# Patient Record
Sex: Female | Born: 1945 | Race: Black or African American | Hispanic: No | Marital: Married | Smoking: Current every day smoker
Health system: Southern US, Community
[De-identification: ages and names within clinical notes are randomized; demographics above are authoritative.]

## PROBLEM LIST (undated history)

## (undated) DIAGNOSIS — R0609 Other forms of dyspnea: Secondary | ICD-10-CM

## (undated) DIAGNOSIS — C50919 Malignant neoplasm of unspecified site of unspecified female breast: Secondary | ICD-10-CM

## (undated) DIAGNOSIS — E039 Hypothyroidism, unspecified: Secondary | ICD-10-CM

## (undated) DIAGNOSIS — R011 Cardiac murmur, unspecified: Secondary | ICD-10-CM

## (undated) DIAGNOSIS — I10 Essential (primary) hypertension: Secondary | ICD-10-CM

## (undated) DIAGNOSIS — R0600 Dyspnea, unspecified: Secondary | ICD-10-CM

## (undated) DIAGNOSIS — I639 Cerebral infarction, unspecified: Secondary | ICD-10-CM

## (undated) DIAGNOSIS — IMO0002 Reserved for concepts with insufficient information to code with codable children: Secondary | ICD-10-CM

## (undated) DIAGNOSIS — I509 Heart failure, unspecified: Secondary | ICD-10-CM

## (undated) DIAGNOSIS — G5601 Carpal tunnel syndrome, right upper limb: Secondary | ICD-10-CM

## (undated) DIAGNOSIS — I219 Acute myocardial infarction, unspecified: Secondary | ICD-10-CM

## (undated) DIAGNOSIS — I209 Angina pectoris, unspecified: Secondary | ICD-10-CM

## (undated) DIAGNOSIS — M79609 Pain in unspecified limb: Secondary | ICD-10-CM

## (undated) DIAGNOSIS — I739 Peripheral vascular disease, unspecified: Secondary | ICD-10-CM

## (undated) DIAGNOSIS — E7800 Pure hypercholesterolemia, unspecified: Secondary | ICD-10-CM

## (undated) DIAGNOSIS — J189 Pneumonia, unspecified organism: Secondary | ICD-10-CM

## (undated) DIAGNOSIS — I499 Cardiac arrhythmia, unspecified: Secondary | ICD-10-CM

## (undated) DIAGNOSIS — E119 Type 2 diabetes mellitus without complications: Secondary | ICD-10-CM

## (undated) DIAGNOSIS — R569 Unspecified convulsions: Secondary | ICD-10-CM

## (undated) DIAGNOSIS — M1990 Unspecified osteoarthritis, unspecified site: Secondary | ICD-10-CM

## (undated) DIAGNOSIS — G9511 Acute infarction of spinal cord (embolic) (nonembolic): Secondary | ICD-10-CM

## (undated) DIAGNOSIS — N319 Neuromuscular dysfunction of bladder, unspecified: Secondary | ICD-10-CM

## (undated) DIAGNOSIS — G9581 Conus medullaris syndrome: Secondary | ICD-10-CM

## (undated) HISTORY — DX: Essential (primary) hypertension: I10

## (undated) HISTORY — DX: Unspecified osteoarthritis, unspecified site: M19.90

## (undated) HISTORY — DX: Cerebral infarction, unspecified: I63.9

## (undated) HISTORY — PX: TUBAL LIGATION: SHX77

## (undated) HISTORY — DX: Cardiac arrhythmia, unspecified: I49.9

## (undated) HISTORY — DX: Reserved for concepts with insufficient information to code with codable children: IMO0002

## (undated) HISTORY — PX: FRACTURE SURGERY: SHX138

## (undated) HISTORY — DX: Acute myocardial infarction, unspecified: I21.9

## (undated) HISTORY — DX: Peripheral vascular disease, unspecified: I73.9

## (undated) HISTORY — DX: Heart failure, unspecified: I50.9

## (undated) HISTORY — DX: Cardiac murmur, unspecified: R01.1

## (undated) HISTORY — PX: TONSILLECTOMY AND ADENOIDECTOMY: SUR1326

## (undated) HISTORY — PX: BREAST LUMPECTOMY: SHX2

## (undated) HISTORY — PX: DILATION AND CURETTAGE OF UTERUS: SHX78

## (undated) HISTORY — DX: Pain in unspecified limb: M79.609

---
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
## 1994-09-21 DIAGNOSIS — I219 Acute myocardial infarction, unspecified: Secondary | ICD-10-CM

## 1994-09-21 HISTORY — DX: Acute myocardial infarction, unspecified: I21.9

## 1997-10-31 ENCOUNTER — Ambulatory Visit (HOSPITAL_COMMUNITY): Admission: RE | Admit: 1997-10-31 | Discharge: 1997-10-31 | Payer: Self-pay | Admitting: General Surgery

## 1998-09-21 HISTORY — PX: FOOT TENDON SURGERY: SHX958

## 1998-10-10 ENCOUNTER — Ambulatory Visit (HOSPITAL_COMMUNITY): Admission: RE | Admit: 1998-10-10 | Discharge: 1998-10-10 | Payer: Self-pay | Admitting: Internal Medicine

## 1998-12-03 ENCOUNTER — Encounter: Payer: Self-pay | Admitting: Internal Medicine

## 1998-12-03 ENCOUNTER — Ambulatory Visit (HOSPITAL_COMMUNITY): Admission: RE | Admit: 1998-12-03 | Discharge: 1998-12-03 | Payer: Self-pay | Admitting: Internal Medicine

## 1999-04-09 ENCOUNTER — Emergency Department (HOSPITAL_COMMUNITY): Admission: EM | Admit: 1999-04-09 | Discharge: 1999-04-09 | Payer: Self-pay | Admitting: Emergency Medicine

## 1999-04-09 ENCOUNTER — Encounter: Payer: Self-pay | Admitting: Emergency Medicine

## 1999-04-17 ENCOUNTER — Ambulatory Visit (HOSPITAL_COMMUNITY): Admission: RE | Admit: 1999-04-17 | Discharge: 1999-04-17 | Payer: Self-pay | Admitting: Internal Medicine

## 2000-08-17 ENCOUNTER — Emergency Department (HOSPITAL_COMMUNITY): Admission: EM | Admit: 2000-08-17 | Discharge: 2000-08-17 | Payer: Self-pay | Admitting: Emergency Medicine

## 2000-08-17 ENCOUNTER — Encounter: Payer: Self-pay | Admitting: Emergency Medicine

## 2000-11-17 ENCOUNTER — Encounter (HOSPITAL_BASED_OUTPATIENT_CLINIC_OR_DEPARTMENT_OTHER): Payer: Self-pay | Admitting: General Surgery

## 2000-11-17 ENCOUNTER — Encounter: Admission: RE | Admit: 2000-11-17 | Discharge: 2000-11-17 | Payer: Self-pay | Admitting: General Surgery

## 2003-01-22 ENCOUNTER — Encounter: Payer: Self-pay | Admitting: Internal Medicine

## 2003-01-22 ENCOUNTER — Ambulatory Visit (HOSPITAL_COMMUNITY): Admission: RE | Admit: 2003-01-22 | Discharge: 2003-01-22 | Payer: Self-pay | Admitting: Internal Medicine

## 2004-04-08 ENCOUNTER — Inpatient Hospital Stay (HOSPITAL_COMMUNITY): Admission: EM | Admit: 2004-04-08 | Discharge: 2004-04-15 | Payer: Self-pay | Admitting: Emergency Medicine

## 2005-02-11 ENCOUNTER — Ambulatory Visit (HOSPITAL_COMMUNITY): Admission: RE | Admit: 2005-02-11 | Discharge: 2005-02-11 | Payer: Self-pay | Admitting: Internal Medicine

## 2007-03-09 ENCOUNTER — Emergency Department (HOSPITAL_COMMUNITY): Admission: EM | Admit: 2007-03-09 | Discharge: 2007-03-10 | Payer: Self-pay | Admitting: Emergency Medicine

## 2008-10-22 DIAGNOSIS — G9511 Acute infarction of spinal cord (embolic) (nonembolic): Secondary | ICD-10-CM

## 2008-10-22 DIAGNOSIS — I639 Cerebral infarction, unspecified: Secondary | ICD-10-CM

## 2008-10-22 HISTORY — DX: Acute infarction of spinal cord (embolic) (nonembolic): G95.11

## 2008-10-22 HISTORY — DX: Cerebral infarction, unspecified: I63.9

## 2008-11-18 ENCOUNTER — Inpatient Hospital Stay (HOSPITAL_COMMUNITY): Admission: EM | Admit: 2008-11-18 | Discharge: 2008-11-26 | Payer: Self-pay | Admitting: Emergency Medicine

## 2008-11-26 ENCOUNTER — Ambulatory Visit: Payer: Self-pay | Admitting: Physical Medicine & Rehabilitation

## 2008-11-26 ENCOUNTER — Inpatient Hospital Stay (HOSPITAL_COMMUNITY): Admission: RE | Admit: 2008-11-26 | Discharge: 2008-12-15 | Payer: Self-pay | Admitting: Diagnostic Radiology

## 2008-11-29 ENCOUNTER — Ambulatory Visit: Payer: Self-pay | Admitting: Vascular Surgery

## 2008-11-29 ENCOUNTER — Encounter: Payer: Self-pay | Admitting: Physical Medicine & Rehabilitation

## 2008-12-01 ENCOUNTER — Ambulatory Visit: Payer: Self-pay | Admitting: Physical Medicine & Rehabilitation

## 2009-01-18 ENCOUNTER — Encounter
Admission: RE | Admit: 2009-01-18 | Discharge: 2009-03-19 | Payer: Self-pay | Admitting: Physical Medicine & Rehabilitation

## 2009-01-22 ENCOUNTER — Ambulatory Visit: Payer: Self-pay | Admitting: Physical Medicine & Rehabilitation

## 2009-02-04 ENCOUNTER — Encounter
Admission: RE | Admit: 2009-02-04 | Discharge: 2009-05-05 | Payer: Self-pay | Admitting: Physical Medicine & Rehabilitation

## 2009-03-19 ENCOUNTER — Ambulatory Visit: Payer: Self-pay | Admitting: Physical Medicine & Rehabilitation

## 2009-05-08 ENCOUNTER — Encounter
Admission: RE | Admit: 2009-05-08 | Discharge: 2009-06-24 | Payer: Self-pay | Admitting: Physical Medicine & Rehabilitation

## 2009-06-13 ENCOUNTER — Encounter
Admission: RE | Admit: 2009-06-13 | Discharge: 2009-09-11 | Payer: Self-pay | Admitting: Physical Medicine & Rehabilitation

## 2009-08-02 ENCOUNTER — Ambulatory Visit: Payer: Self-pay | Admitting: Physical Medicine & Rehabilitation

## 2009-09-25 ENCOUNTER — Encounter
Admission: RE | Admit: 2009-09-25 | Discharge: 2009-12-24 | Payer: Self-pay | Admitting: Physical Medicine & Rehabilitation

## 2009-09-27 ENCOUNTER — Ambulatory Visit: Payer: Self-pay | Admitting: Physical Medicine & Rehabilitation

## 2009-11-22 ENCOUNTER — Ambulatory Visit: Payer: Self-pay | Admitting: Physical Medicine & Rehabilitation

## 2009-12-11 ENCOUNTER — Encounter
Admission: RE | Admit: 2009-12-11 | Discharge: 2010-03-11 | Payer: Self-pay | Admitting: Physical Medicine & Rehabilitation

## 2010-02-18 ENCOUNTER — Encounter
Admission: RE | Admit: 2010-02-18 | Discharge: 2010-05-19 | Payer: Self-pay | Admitting: Physical Medicine & Rehabilitation

## 2010-02-19 ENCOUNTER — Ambulatory Visit: Payer: Self-pay | Admitting: Physical Medicine & Rehabilitation

## 2010-02-20 ENCOUNTER — Ambulatory Visit (HOSPITAL_COMMUNITY)
Admission: RE | Admit: 2010-02-20 | Discharge: 2010-02-20 | Payer: Self-pay | Admitting: Physical Medicine & Rehabilitation

## 2010-03-12 ENCOUNTER — Encounter
Admission: RE | Admit: 2010-03-12 | Discharge: 2010-06-10 | Payer: Self-pay | Admitting: Physical Medicine & Rehabilitation

## 2010-04-07 ENCOUNTER — Ambulatory Visit: Payer: Self-pay | Admitting: Physical Medicine & Rehabilitation

## 2010-05-08 ENCOUNTER — Encounter: Admission: RE | Admit: 2010-05-08 | Discharge: 2010-05-08 | Payer: Self-pay | Admitting: Internal Medicine

## 2010-05-13 ENCOUNTER — Encounter: Admission: RE | Admit: 2010-05-13 | Discharge: 2010-05-13 | Payer: Self-pay | Admitting: Internal Medicine

## 2010-06-26 ENCOUNTER — Encounter
Admission: RE | Admit: 2010-06-26 | Discharge: 2010-07-02 | Payer: Self-pay | Source: Home / Self Care | Attending: Physical Medicine & Rehabilitation | Admitting: Physical Medicine & Rehabilitation

## 2010-07-02 ENCOUNTER — Ambulatory Visit: Payer: Self-pay | Admitting: Physical Medicine & Rehabilitation

## 2010-09-22 ENCOUNTER — Encounter
Admission: RE | Admit: 2010-09-22 | Discharge: 2010-10-21 | Payer: Self-pay | Source: Home / Self Care | Attending: Physical Medicine & Rehabilitation | Admitting: Physical Medicine & Rehabilitation

## 2010-09-24 ENCOUNTER — Ambulatory Visit: Admit: 2010-09-24 | Payer: Self-pay | Admitting: Physical Medicine & Rehabilitation

## 2010-10-12 ENCOUNTER — Encounter: Payer: Self-pay | Admitting: Internal Medicine

## 2010-10-24 ENCOUNTER — Other Ambulatory Visit: Payer: Self-pay | Admitting: Internal Medicine

## 2010-10-24 DIAGNOSIS — Z09 Encounter for follow-up examination after completed treatment for conditions other than malignant neoplasm: Secondary | ICD-10-CM

## 2010-11-06 ENCOUNTER — Ambulatory Visit
Admission: RE | Admit: 2010-11-06 | Discharge: 2010-11-06 | Disposition: A | Discharge status: Home / Self Care | Payer: BC Managed Care – PPO | Source: Ambulatory Visit | Attending: Internal Medicine | Admitting: Internal Medicine

## 2010-11-06 DIAGNOSIS — Z09 Encounter for follow-up examination after completed treatment for conditions other than malignant neoplasm: Secondary | ICD-10-CM

## 2011-01-01 LAB — PROTIME-INR: Prothrombin Time: 13.4 seconds (ref 11.6–15.2)

## 2011-01-01 LAB — GLUCOSE, CAPILLARY
Glucose-Capillary: 103 mg/dL — ABNORMAL HIGH (ref 70–99)
Glucose-Capillary: 109 mg/dL — ABNORMAL HIGH (ref 70–99)
Glucose-Capillary: 110 mg/dL — ABNORMAL HIGH (ref 70–99)
Glucose-Capillary: 112 mg/dL — ABNORMAL HIGH (ref 70–99)
Glucose-Capillary: 112 mg/dL — ABNORMAL HIGH (ref 70–99)
Glucose-Capillary: 113 mg/dL — ABNORMAL HIGH (ref 70–99)
Glucose-Capillary: 115 mg/dL — ABNORMAL HIGH (ref 70–99)
Glucose-Capillary: 116 mg/dL — ABNORMAL HIGH (ref 70–99)
Glucose-Capillary: 117 mg/dL — ABNORMAL HIGH (ref 70–99)
Glucose-Capillary: 119 mg/dL — ABNORMAL HIGH (ref 70–99)
Glucose-Capillary: 121 mg/dL — ABNORMAL HIGH (ref 70–99)
Glucose-Capillary: 123 mg/dL — ABNORMAL HIGH (ref 70–99)
Glucose-Capillary: 126 mg/dL — ABNORMAL HIGH (ref 70–99)
Glucose-Capillary: 128 mg/dL — ABNORMAL HIGH (ref 70–99)
Glucose-Capillary: 130 mg/dL — ABNORMAL HIGH (ref 70–99)
Glucose-Capillary: 132 mg/dL — ABNORMAL HIGH (ref 70–99)
Glucose-Capillary: 132 mg/dL — ABNORMAL HIGH (ref 70–99)
Glucose-Capillary: 133 mg/dL — ABNORMAL HIGH (ref 70–99)
Glucose-Capillary: 139 mg/dL — ABNORMAL HIGH (ref 70–99)
Glucose-Capillary: 142 mg/dL — ABNORMAL HIGH (ref 70–99)
Glucose-Capillary: 144 mg/dL — ABNORMAL HIGH (ref 70–99)
Glucose-Capillary: 148 mg/dL — ABNORMAL HIGH (ref 70–99)
Glucose-Capillary: 169 mg/dL — ABNORMAL HIGH (ref 70–99)
Glucose-Capillary: 193 mg/dL — ABNORMAL HIGH (ref 70–99)
Glucose-Capillary: 195 mg/dL — ABNORMAL HIGH (ref 70–99)
Glucose-Capillary: 59 mg/dL — ABNORMAL LOW (ref 70–99)
Glucose-Capillary: 74 mg/dL (ref 70–99)
Glucose-Capillary: 79 mg/dL (ref 70–99)
Glucose-Capillary: 85 mg/dL (ref 70–99)
Glucose-Capillary: 91 mg/dL (ref 70–99)
Glucose-Capillary: 102 mg/dL — ABNORMAL HIGH (ref 70–99)
Glucose-Capillary: 104 mg/dL — ABNORMAL HIGH (ref 70–99)
Glucose-Capillary: 107 mg/dL — ABNORMAL HIGH (ref 70–99)
Glucose-Capillary: 108 mg/dL — ABNORMAL HIGH (ref 70–99)
Glucose-Capillary: 112 mg/dL — ABNORMAL HIGH (ref 70–99)
Glucose-Capillary: 112 mg/dL — ABNORMAL HIGH (ref 70–99)
Glucose-Capillary: 114 mg/dL — ABNORMAL HIGH (ref 70–99)
Glucose-Capillary: 115 mg/dL — ABNORMAL HIGH (ref 70–99)
Glucose-Capillary: 115 mg/dL — ABNORMAL HIGH (ref 70–99)
Glucose-Capillary: 116 mg/dL — ABNORMAL HIGH (ref 70–99)
Glucose-Capillary: 119 mg/dL — ABNORMAL HIGH (ref 70–99)
Glucose-Capillary: 121 mg/dL — ABNORMAL HIGH (ref 70–99)
Glucose-Capillary: 121 mg/dL — ABNORMAL HIGH (ref 70–99)
Glucose-Capillary: 121 mg/dL — ABNORMAL HIGH (ref 70–99)
Glucose-Capillary: 122 mg/dL — ABNORMAL HIGH (ref 70–99)
Glucose-Capillary: 123 mg/dL — ABNORMAL HIGH (ref 70–99)
Glucose-Capillary: 124 mg/dL — ABNORMAL HIGH (ref 70–99)
Glucose-Capillary: 126 mg/dL — ABNORMAL HIGH (ref 70–99)
Glucose-Capillary: 130 mg/dL — ABNORMAL HIGH (ref 70–99)
Glucose-Capillary: 131 mg/dL — ABNORMAL HIGH (ref 70–99)
Glucose-Capillary: 138 mg/dL — ABNORMAL HIGH (ref 70–99)
Glucose-Capillary: 142 mg/dL — ABNORMAL HIGH (ref 70–99)
Glucose-Capillary: 142 mg/dL — ABNORMAL HIGH (ref 70–99)
Glucose-Capillary: 149 mg/dL — ABNORMAL HIGH (ref 70–99)
Glucose-Capillary: 151 mg/dL — ABNORMAL HIGH (ref 70–99)
Glucose-Capillary: 154 mg/dL — ABNORMAL HIGH (ref 70–99)
Glucose-Capillary: 156 mg/dL — ABNORMAL HIGH (ref 70–99)
Glucose-Capillary: 192 mg/dL — ABNORMAL HIGH (ref 70–99)
Glucose-Capillary: 192 mg/dL — ABNORMAL HIGH (ref 70–99)
Glucose-Capillary: 195 mg/dL — ABNORMAL HIGH (ref 70–99)
Glucose-Capillary: 95 mg/dL (ref 70–99)
Glucose-Capillary: 96 mg/dL (ref 70–99)
Glucose-Capillary: 96 mg/dL (ref 70–99)
Glucose-Capillary: 97 mg/dL (ref 70–99)

## 2011-01-01 LAB — T4, FREE: Free T4: 0.74 ng/dL — ABNORMAL LOW (ref 0.89–1.80)

## 2011-01-01 LAB — COMPREHENSIVE METABOLIC PANEL
ALT: 13 U/L (ref 0–35)
ALT: 25 U/L (ref 0–35)
AST: 19 U/L (ref 0–37)
AST: 41 U/L — ABNORMAL HIGH (ref 0–37)
Albumin: 3.3 g/dL — ABNORMAL LOW (ref 3.5–5.2)
Albumin: 3.3 g/dL — ABNORMAL LOW (ref 3.5–5.2)
Albumin: 3.4 g/dL — ABNORMAL LOW (ref 3.5–5.2)
Alkaline Phosphatase: 46 U/L (ref 39–117)
Alkaline Phosphatase: 48 U/L (ref 39–117)
Alkaline Phosphatase: 49 U/L (ref 39–117)
BUN: 10 mg/dL (ref 6–23)
BUN: 21 mg/dL (ref 6–23)
BUN: 6 mg/dL (ref 6–23)
CO2: 27 mEq/L (ref 19–32)
Calcium: 9.4 mg/dL (ref 8.4–10.5)
Chloride: 100 mEq/L (ref 96–112)
Creatinine, Ser: 0.79 mg/dL (ref 0.4–1.2)
Creatinine, Ser: 0.95 mg/dL (ref 0.4–1.2)
GFR calc Af Amer: >60 mL/min (ref >60)
GFR calc Af Amer: >60 mL/min (ref >60)
GFR calc Af Amer: >60 mL/min (ref >60)
GFR calc non Af Amer: >60 mL/min (ref >60)
Glucose, Bld: 107 mg/dL — ABNORMAL HIGH (ref 70–99)
Potassium: 3.7 mEq/L (ref 3.5–5.1)
Potassium: 3.7 mEq/L (ref 3.5–5.1)
Potassium: 4.3 mEq/L (ref 3.5–5.1)
Sodium: 137 mEq/L (ref 135–145)
Sodium: 139 mEq/L (ref 135–145)
Sodium: 139 mEq/L (ref 135–145)
Total Protein: 5.7 g/dL — ABNORMAL LOW (ref 6.0–8.3)
Total Protein: 5.9 g/dL — ABNORMAL LOW (ref 6.0–8.3)
Total Protein: 6.6 g/dL (ref 6.0–8.3)

## 2011-01-01 LAB — URINE MICROSCOPIC-ADD ON

## 2011-01-01 LAB — BASIC METABOLIC PANEL
BUN: 22 mg/dL (ref 6–23)
BUN: 38 mg/dL — ABNORMAL HIGH (ref 6–23)
CO2: 24 mEq/L (ref 19–32)
Calcium: 9.8 mg/dL (ref 8.4–10.5)
Chloride: 100 mEq/L (ref 96–112)
Chloride: 104 mEq/L (ref 96–112)
Creatinine, Ser: 0.94 mg/dL (ref 0.4–1.2)
Creatinine, Ser: 0.9 mg/dL (ref 0.4–1.2)
Creatinine, Ser: 1.25 mg/dL — ABNORMAL HIGH (ref 0.4–1.2)
GFR calc Af Amer: >60 mL/min (ref >60)
GFR calc Af Amer: >60 mL/min (ref >60)
GFR calc non Af Amer: 43 mL/min — ABNORMAL LOW (ref >60)
GFR calc non Af Amer: >60 mL/min (ref >60)
Glucose, Bld: 113 mg/dL — ABNORMAL HIGH (ref 70–99)
Potassium: 4.8 mEq/L (ref 3.5–5.1)
Potassium: 4.8 mEq/L (ref 3.5–5.1)
Sodium: 135 mEq/L (ref 135–145)

## 2011-01-01 LAB — CBC
MCHC: 33.7 g/dL (ref 30.0–36.0)
MCV: 88.7 fL (ref 78.0–100.0)
MCV: 89.3 fL (ref 78.0–100.0)
Platelets: 243 10*3/uL (ref 150–400)
Platelets: 346 10*3/uL (ref 150–400)
Platelets: 363 10*3/uL (ref 150–400)
RDW: 14.1 % (ref 11.5–15.5)
WBC: 8.7 10*3/uL (ref 4.0–10.5)
WBC: 9.7 10*3/uL (ref 4.0–10.5)

## 2011-01-01 LAB — CK: Total CK: 78 U/L (ref 7–177)

## 2011-01-01 LAB — URINALYSIS, ROUTINE W REFLEX MICROSCOPIC
Bilirubin Urine: NEGATIVE
Bilirubin Urine: NEGATIVE
Glucose, UA: NEGATIVE mg/dL
Ketones, ur: NEGATIVE mg/dL
Nitrite: NEGATIVE
Specific Gravity, Urine: 1.006 (ref 1.005–1.030)
Specific Gravity, Urine: 1.024 (ref 1.005–1.030)
pH: 6 (ref 5.0–8.0)
pH: 6.5 (ref 5.0–8.0)

## 2011-01-01 LAB — URINE CULTURE: Colony Count: >=100000

## 2011-01-01 LAB — DIFFERENTIAL
Basophils Relative: 0 % (ref 0–1)
Eosinophils Relative: 1 % (ref 0–5)
Lymphocytes Relative: 23 % (ref 12–46)
Lymphs Abs: 2 10*3/uL (ref 0.7–4.0)
Monocytes Absolute: 0.8 10*3/uL (ref 0.1–1.0)
Monocytes Absolute: 1 10*3/uL (ref 0.1–1.0)
Monocytes Relative: 10 % (ref 3–12)
Neutro Abs: 6.6 10*3/uL (ref 1.7–7.7)

## 2011-01-01 LAB — APTT: aPTT: 32 seconds (ref 24–37)

## 2011-01-01 LAB — T3 UPTAKE: T3 Uptake Ratio: 29.5 % (ref 22.5–37.0)

## 2011-01-01 LAB — TSH: TSH: 3.752 u[IU]/mL (ref 0.350–4.500)

## 2011-01-06 LAB — URINALYSIS, ROUTINE W REFLEX MICROSCOPIC
Glucose, UA: NEGATIVE mg/dL
Ketones, ur: NEGATIVE mg/dL
Leukocytes, UA: NEGATIVE
Nitrite: NEGATIVE
Protein, ur: 100 mg/dL — AB
Urobilinogen, UA: 0.2 mg/dL (ref 0.0–1.0)

## 2011-01-06 LAB — COMPREHENSIVE METABOLIC PANEL
ALT: 17 U/L (ref 0–35)
Alkaline Phosphatase: 58 U/L (ref 39–117)
BUN: 6 mg/dL (ref 6–23)
CO2: 21 mEq/L (ref 19–32)
Calcium: 8.7 mg/dL (ref 8.4–10.5)
GFR calc non Af Amer: >60 mL/min (ref >60)
Glucose, Bld: 140 mg/dL — ABNORMAL HIGH (ref 70–99)
Sodium: 135 mEq/L (ref 135–145)
Total Protein: 6.3 g/dL (ref 6.0–8.3)

## 2011-01-06 LAB — DIFFERENTIAL
Basophils Relative: 0 % (ref 0–1)
Eosinophils Absolute: 0 10*3/uL (ref 0.0–0.7)
Lymphs Abs: 1.3 10*3/uL (ref 0.7–4.0)
Monocytes Relative: 1 % — ABNORMAL LOW (ref 3–12)
Neutro Abs: 9 10*3/uL — ABNORMAL HIGH (ref 1.7–7.7)
Neutrophils Relative %: 86 % — ABNORMAL HIGH (ref 43–77)

## 2011-01-06 LAB — CBC
HCT: 41.3 % (ref 36.0–46.0)
Hemoglobin: 14 g/dL (ref 12.0–15.0)
MCHC: 34 g/dL (ref 30.0–36.0)
RBC: 4.67 MIL/uL (ref 3.87–5.11)
RDW: 15.3 % (ref 11.5–15.5)

## 2011-01-14 ENCOUNTER — Encounter
Payer: BC Managed Care – PPO | Attending: Physical Medicine & Rehabilitation | Admitting: Physical Medicine & Rehabilitation

## 2011-01-14 DIAGNOSIS — X58XXXS Exposure to other specified factors, sequela: Secondary | ICD-10-CM | POA: Insufficient documentation

## 2011-01-14 DIAGNOSIS — M545 Low back pain, unspecified: Secondary | ICD-10-CM | POA: Insufficient documentation

## 2011-01-14 DIAGNOSIS — G8220 Paraplegia, unspecified: Secondary | ICD-10-CM | POA: Insufficient documentation

## 2011-01-14 DIAGNOSIS — G9589 Other specified diseases of spinal cord: Secondary | ICD-10-CM | POA: Insufficient documentation

## 2011-01-14 DIAGNOSIS — M1710 Unilateral primary osteoarthritis, unspecified knee: Secondary | ICD-10-CM | POA: Insufficient documentation

## 2011-01-14 DIAGNOSIS — IMO0002 Reserved for concepts with insufficient information to code with codable children: Secondary | ICD-10-CM | POA: Insufficient documentation

## 2011-01-14 DIAGNOSIS — N319 Neuromuscular dysfunction of bladder, unspecified: Secondary | ICD-10-CM | POA: Insufficient documentation

## 2011-01-14 DIAGNOSIS — M79609 Pain in unspecified limb: Secondary | ICD-10-CM | POA: Insufficient documentation

## 2011-01-14 DIAGNOSIS — S343XXA Injury of cauda equina, initial encounter: Secondary | ICD-10-CM

## 2011-01-14 DIAGNOSIS — N318 Other neuromuscular dysfunction of bladder: Secondary | ICD-10-CM | POA: Insufficient documentation

## 2011-01-14 DIAGNOSIS — K592 Neurogenic bowel, not elsewhere classified: Secondary | ICD-10-CM | POA: Insufficient documentation

## 2011-01-14 DIAGNOSIS — M109 Gout, unspecified: Secondary | ICD-10-CM | POA: Insufficient documentation

## 2011-01-15 NOTE — Assessment & Plan Note (Signed)
Alexandra Wiley is back regarding her spinal cord injury.  She missed her last appointment as she was ill.  She is doing her fitness training at a local site here in town, but focus does not appear to be functional aspects and more of strength.  It appears she has done some standing. She complains of more pain in the legs and occasional low back pain. She still sleeps in the hospital bed.  She is not on a q.i.d. Neurontin as I wrote last time and she states pharmacy did not fill that way. Sleep is fair.  She reports pain is tingling, burning, sharp, and aching at times.  Pain interferes with general activities, relations with others, enjoyment of life on a moderate level.  REVIEW OF SYSTEMS:  Notable for numbness, spasms, and high sugars.  Full 12-point review is in the written health and history section of the chart.  SOCIAL HISTORY:  Unchanged.  She is married, living with her husband.  PHYSICAL EXAMINATION:  VITAL SIGNS:  Blood pressure is 130/53, pulse 67, respiratory rate 18, and she is saturating 97% on room air. MUSCULOSKELETAL:  Lower extremity strength notable for 4/5 strength in the hip and bilateral knees.  She has trace movement at either ankle. She is wearing bilateral double upright AFOs today.  Sensory exam is diminished below the level of her injury usually beginning in the upper thigh region.  Reflexes are 1+ to absent.  No resting tone, clonus, etc. HEART:  Regular. CHEST:  Clear. ABDOMEN:  Soft and nontender.  ASSESSMENT: 1. 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 2. Neurogenic bowel and bladder which is showing some signs of     improvement. 3. Neuropathic lower extremity pain. 4. Osteoarthritis, left knee. 5. Bladder dyssynergia. 6. History of gout.  PLAN: 1. We will send the patient to outpatient physical therapy to further     work on gait and balance.  I think she has gained enough strength     in her proximal legs that  with her bilateral AFOs, she could begin     to work on some functional gait. 2. Increase Neurontin to 300 mg q.i.d. and the Pamelor will be     increased 25 mg at bedtime. 3. Continue other medications as written including her Voltaren.     Oxycodone was not refilled today. 4. I will see the patient back in about 3 months.  She will call me     with any problems or questions.  We did also review bowel regimen     today.  Bladder management, we will continue per Dr. McDiarmid.     Ranelle Oyster, M.D. Electronically Signed    ZTS/MedQ D:  01/14/2011 14:02:30  T:  01/15/2011 00:50:09  Job #:  [id number]  cc:   Alexandra Wiley, M.D.  Alexandra Wiley, M.D. Fax: [phone]

## 2011-01-21 ENCOUNTER — Ambulatory Visit: Payer: BC Managed Care – PPO | Attending: Physical Medicine & Rehabilitation | Admitting: *Deleted

## 2011-01-21 DIAGNOSIS — IMO0001 Reserved for inherently not codable concepts without codable children: Secondary | ICD-10-CM | POA: Insufficient documentation

## 2011-01-21 DIAGNOSIS — M6281 Muscle weakness (generalized): Secondary | ICD-10-CM | POA: Insufficient documentation

## 2011-01-21 DIAGNOSIS — R262 Difficulty in walking, not elsewhere classified: Secondary | ICD-10-CM | POA: Insufficient documentation

## 2011-02-03 NOTE — Assessment & Plan Note (Signed)
Alexandra Wiley is back regarding her conus medullaris syndrome and paraplegia.  She is now on outpatient therapy and making continued progress.  She has  taken a few steps in the parallel bars and doing more scoot, fidget type  transfers currently.  She still has weakness in her legs and decreased  sensation particularly distally.  She is continent of her bowels and  bladder now and really not requiring any, particular cathing or bowel  program.  She did develop a UTI, for which she is on Cipro.  Sleep is  good.  She has had a bit more pain  in her legs with the therapy and Dr.  Sharene Skeans saw her last week and increased her Neurontin to 100 mg q.i.d..   REVIEW OF SYSTEMS:  Notable for the above as well as occasional spasm.  Full 14-point review of systems in the written health and history  section of the chart.  Other pertinent positives are above.   SOCIAL HISTORY:  Unchanged.  The patient is married living at home.   PHYSICAL EXAMINATION:  Blood pressure 153/78, pulse is 100, respiratory  rate 18.  She is saturating 99% on room air.  The patient is pleasant,  alert, and oriented x3.  Affect is generally bright and appropriate.  Upper extremity exam is normal.  She continues to have decreased  sensation in both legs and both ankles; however the below-knee sensation  seems to have improved somewhat particularly in anterior aspect.  She  has 4/5 hip adduction; however, abduction is 1/5.  Knee extension is 3-  4/5.  Knee flexion is 1-2/5.  She has trace to 0 ankle plantar flexion  or dorsiflexion really on examination today.  Skin is generally intact.  Reflexes were decreased bilaterally at the knees and ankles.  No  appreciable swelling is seen in either legs or the knee is appreciated.  Cognitively she is intact.   ASSESSMENT:  1. 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  The patient is expressing some improvement in motor      function particularly at  the L2-L3 and partially L4 levels.  2. Lower extremity pain syndrome related to above.  3. History of gout.  4. History of diabetes.   PLAN:  1. The patient continues to progress.  I would like to see her fitted      with AFOs, as I think these would progress her along from a      standpoint transfers and gait.  She ultimately may do nicely with      aquatic therapy.  2. I agree with Neurontin titration, also with the patient 30      oxycodone for breakthrough pain.  She is on Robaxin p.r.n. for      spasm.  3. Treatment of UTI with Cipro.  4. I will see the patient back in about 3 months' time.      Ranelle Oyster, M.D.  Electronically Signed     ZTS/MedQ  D:  03/19/2009 12:47:15  T:  03/20/2009 03:21:21  Job #:  [id number]   cc:   Deanna Artis. Sharene Skeans, M.D.  Fax: [phone]   Bertram Millard. Dahlstedt, M.D.  Fax: [phone]

## 2011-02-03 NOTE — Consult Note (Signed)
NAME:  Alexandra Wiley, Alexandra Wiley             ACCOUNT NO.:  [id number]   MEDICAL RECORD NO.:  [id number]          PATIENT TYPE:  INP   LOCATION:  1831                         FACILITY:  [facility]   PHYSICIAN:  Noel Christmas, MD    DATE OF BIRTH:  May 22, 1946   DATE OF CONSULTATION:  DATE OF DISCHARGE:                                 CONSULTATION   REFERRING PHYSICIAN:  Dr. Ardeth Sportsman.   REASON FOR CONSULTATION:  Acute onset of severe weakness of both lower  extremities.   This is a 65 year old African American lady who presented with history  of acute weakness of both lower extremities.  Symptoms started with  severe muscle cramps involving thighs and legs at about 2 a.m. this  morning.  She subsequently became progressively weaker and was unable to  walk around 7 a.m. to 8 a.m.  She has continued to experience  progressive weakness in the lower extremities with loss of ability to  move her legs at all at this point.  She has also experienced numbness  primarily in her feet.  She had some difficulty with urination and  required catheterization to obtain urine sample and to empty her  bladder.  There is no previous history of symptoms involving her lower  extremities, lower back except occasional backache.  She has had back  pain along with lower extremity pain and weakness today.  L-spine x-ray  showed multilevel degenerative changes.  MRI of her spine showed  findings consistent with epidural hematoma that was posterior to the  body of L1 with no clear signs of compression of nerve roots nor conus  medullaris at that level.  Multilevel disk protrusions were also noted.  There was no clear cut nerve root compression at any level.  Symptoms  were most prominent at the L4-L5 and L5-S1.  The patient has history of  diabetes mellitus.  She has not experienced any clear signs of  peripheral neuropathy.  She has had no symptoms involving her upper  extremities.  She has also had no changes in  breathing.   PAST MEDICAL HISTORY:  Remarkable for hypertension, gout, diabetes  mellitus, and degenerative disk disease involving lumbar spine.   CURRENT MEDICATIONS:  1. Indocin 75 mg p.r.n., gout attack.  2. Insulin regular administered on p.r.n. basis for blood sugar      greater than 150.  3. Lasix 40 mg per day.  4. Ultram 50 mg p.r.n.  5. Allopurinol as needed for symptoms of gout.  6. Metoprolol 50 mg daily.  7. Exforge 10/320 mg daily.   FAMILY HISTORY:  Positive for hypertension, heart disease as well as  arthritis.   PHYSICAL EXAMINATION:  GENERAL:  Appearance was that of an obese, late  middle-age-appearing lady, who was somewhat drowsy, but easy to arouse.  She was oriented x3.  Mental status was normal.  She was in no acute  distress.  HEENT:  Pupils  were equal and reactive normally to light.  Extraocular  movements were full and conjugate.  Visual fields were intact and  normal.  There was no facial numbness.  No facial weakness.  Hearing was  normal.  Speech and palatal movement were normal.  EXTREMITIES:  Motor exam showed normal strength and normal muscle tone  of both upper extremities.  Lower extremity exam showed no intact  involuntary movement of lower extremities proximally nor distally.  Muscle tone was flaccid.  Deep tendon reflexes were 2+ and symmetrical  in the upper extremities and absent in the lower extremities.  Plantar  responses were mute.  Sensory exam showed reduced perception of pinprick  below the knees bilaterally.  She had no sensory level over trunk nor  pelvic region.  Coordination in the upper extremities was normal.   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  However, the presence of an epidural hematoma in the lumbar  region is somewhat troublesome, is contributing if not primary etiology,  even though MRI showing no clear  signs of nerve root compression nor  compression of conus medullaris.   RECOMMENDATIONS:  1. Recommend admitting to Step-Down Unit and having neuro checks done      serially throughout the night to rule out ascending paralysis as      seen with acute Guillain-Barre syndrome.  2. We will defer lumbar puncture to look for signs of Guillain-Barre      syndrome because of the presence of epidural hematoma and potential      for exacerbation by introducing a needle into the epidural space.  3. We will consider repeating lumbar MRI in the morning or possibly      getting CT myelogram instead.  4. Nerve conduction studies to rule out signs of Guillain-Barre      syndrome.   Thank you for asking me to evaluate this very interesting and hustling  patient.      Noel Christmas, MD  Electronically Signed     CS/MEDQ  D:  11/18/2008  T:  11/19/2008  Job:  [id number]

## 2011-02-03 NOTE — H&P (Signed)
NAME:  Alexandra Wiley, PICCHI             ACCOUNT NO.:  [id number]   MEDICAL RECORD NO.:  [id number]          PATIENT TYPE:  INP   LOCATION:  1831                         FACILITY:  [facility]   PHYSICIAN:  Margaretmary Bayley, M.D.    DATE OF BIRTH:  [birth year]   DATE OF ADMISSION:  11/18/2008  DATE OF DISCHARGE:                              HISTORY & PHYSICAL   ADMITTING PHYSICIAN:  Margaretmary Bayley, MD   PRESENTING COMPLAINTS:  Weakness of both legs.   HISTORY OF PRESENT ILLNESS:  Alexandra Wiley is a 65 year old African  American lady with past medical history significant for type 2 diabetes  mellitus, systemic hypertension, gouty arthritis, degenerative joint  disease of the lumbar spine.  She came to the emergency room at [facility] with 1-day history of weakness in both the legs.  She  stated that she woke at about 2:00 a.m. in the morning with severe  cramps in both of her legs for which the husband was applying some  compress and ice, but her symptoms did not improve.  She also had severe  lower abdominal pain, which was radiating to her buttocks and down to  her legs.  This progressed to weakness in both lower extremities.  She  was able to walk to the bathroom, but when she was in the bathroom at  about 9 o'clock in the morning, she realized that she could not walk on  her legs.  She did not sustain any falls.  She did not have any syncope,  seizures, or severe headaches.  She had no blurring of vision and no  weakness of her upper extremities.  She decided to come to the emergency  room for further evaluation.  She denied having any chest pain,  shortness of breath, orthopnea, or PND.  She has had some cough  productive of yellowish sputum, but she smokes about 1 pack of  cigarettes daily.  She denies any use of alcohol or illicit drugs.  She  apparently has a history of degenerative joint disease of the lumbar  spine and had been taking tramadol for pain.  She had also been taking  indomethacin for her gouty arthritis of her legs and had been seen by an  orthopedist at [facility] in the past but has not returned  there recently.  At the emergency room, her vital signs were stable.  CT  scan of her head did not reveal any acute infarct or hemorrhage.  An MRI  scan of the lumbar spine showed multilevel degenerative joint disease  with an epidural hematoma, small behind L1 vertebrae and was not causing  any compression of the conus medullaris of the exiting nerve roots.  She  had been evaluated by neurologist and recommended for admission.  Internal medicine consult was requested for admission due to her  multiple medical problems.   PAST MEDICAL HISTORY:  1. Type 2 diabetes mellitus.  2. Systemic hypertension.  3. Gouty arthritis.  4. History of degenerative joint disease of lumbar spine.  5. Tobacco abuse.   MEDICATION HISTORY:  1. Indomethacin 75 mg t.i.d. p.r.n.  2. Humulin insulin, dose unknown.  3. Furosemide 40 mg once a day.  4. Tramadol 50 mg q.6 p.r.n.  5. Allopurinol 100 mg daily.  6. Metoprolol 50 mg once a day.  7. Exforge 10/320 once a day.   ALLERGIES:  She has no known drug allergies.   FAMILY AND SOCIAL HISTORY:  She is married and lives with her husband.  Her children are grown.  She works as a Copy in a building.  She last  worked about 2 days ago and had no problems at that time.  She smokes 1  pack of cigarettes daily.  She denies any use of alcohol or illicit  drugs.   REVIEW OF SYSTEMS:  Essentially as above.   PHYSICAL EXAMINATION:  VITAL SIGNS:  In the emergency room shows a blood  pressure of 111/68, heart rate of 73 and regular, respiratory rate of  18, temperature is 96.8 degrees Fahrenheit.  She has O2 sats of 94% on  room air.  She rates her pain as an 8/10.  GENERAL:  She is lying in hospital bed in mild painful distress in her  lower back.  She is not pale.  She is not icteric.  She is not cyanosed.  She is  well hydrated.  HEENT:  Pupils are equal, reactive to light and accommodation.  Oral  mucosa is moist.  NECK:  Supple with no elevated JVD.  No cervical lymphadenopathy.  No  carotid bruit.  CHEST:  Good air entry bilaterally with no rales, rhonchi, or wheezes.  HEART:  Sounds 1 and 2 are head with no murmurs.  ABDOMEN:  Obese, soft, nontender.  No masses.  Bowel sounds are present.  LUMBAR SPINE:  She has tenderness over the L4-L5 lumbar vertebrae as  well as the left sacroiliac joint.  There is no obvious deformity,  erythema, or swelling.  EXTREMITIES:  There is no peripheral edema, varicosities, or venous  dermatitis.  Peripheral pulses are present bilaterally.  NEUROLOGICAL:  She is alert and oriented x3.  There is reduced power in  both lower extremities, more so on the right side than the left with a  power of 1/5 on the right side and 2/5 on the left side.  Weakness is  mainly in the distal aspect of the lower extremities.  There are loss of  reflexes bilaterally in the ankle and knee jerks.  Excessive plantaris  equivocal.   LABORATORY DATA:  Her urinalysis is negative for nitrite or leukocyte  esterase with 0-2 wbc per high-power field.  White count is 10.4,  hemoglobin 14, hematocrit 41.3, platelet count of 269.  Sodium is 135,  potassium 3.6, chloride 105, bicarbonate of 21, glucose is 140, BUN 6,  creatinine 0.68.  Liver function tests essentially within normal limits.   X-ray of the lumbar spine demonstrates disk disease at L4-5, L5-S1 with  no acute bony findings.  Chest x-ray shows cardiac enlargement with mild  central vascular congestion with bronchitic-type changes.  MRI of the  lumbar spine summarized as showing multilevel degenerative disk disease,  worse at L4-5, L5-S1 and a small epidural hematoma in the ventral  epidural space behind the L1 vertebrae.  There is no compression of the  exiting nerve roots or conus medullaris.  CT scan of the head shows no  acute  intracranial infarct or hemorrhage.   ASSESSMENT:  Alexandra Wiley is a 65 year old African American lady with  multiple medical problems as stated above presenting with weakness of  both lower extremities of unknown etiology.  She will be admitted to the  hospital for further evaluation and neurology consultation.   ADMISSION DIAGNOSIS:  Bilateral lower extremity weakness, most likely  due to lumbar spine disease, but to rule out diabetic peripheral  neuropathy and will be monitored closely to make sure she does not have  Guillain-Barre syndrome.   PLAN OF CARE:  She admitted to a Stepdown Unit for close monitoring.  CBGs a.c. and h.s. and cover with sliding scale NovoLog insulin.  She  will be on IV fluids and she will continue her home medications.  Plan  of care has been discussed with her and her husband.      Fleet Contras, M.D.  Electronically Signed      Margaretmary Bayley, M.D.  Electronically Signed    EA/MEDQ  D:  11/18/2008  T:  11/18/2008  Job:  [id number]

## 2011-02-03 NOTE — Discharge Summary (Signed)
NAMEMarland Kitchen  [place], [place]             ACCOUNT NO.:  [id number]   MEDICAL RECORD NO.:  [id number]          PATIENT TYPE:  INP   LOCATION:  6738                         FACILITY:  [facility]   PHYSICIAN:  Margaretmary Bayley, M.D.    DATE OF BIRTH:  02-21-1946   DATE OF ADMISSION:  11/18/2008  DATE OF DISCHARGE:  11/26/2008                               DISCHARGE SUMMARY   DISCHARGE DIAGNOSES:  1. Acute vascular myelopathy with subsequent paraplegia.  2. Urinary tract infection.  3. Type 2 diabetes mellitus.  4. Systemic hypertension.  5. Gouty arthropathy.   REASON FOR ADMISSION:  Ms. Porrata is a 65 year old African American  woman with type 2 diabetes mellitus and systemic hypertension, and the  patient has brought into the emergency room with bilateral lower  extremity weakness.  The patient's weakness was of a sudden onset and  not associated with any neck or spinal trauma.  She had had no prior  history of degenerative disk disease and there was no history of a prior  upper respiratory tract infection.   PERTINENT PHYSICAL FINDINGS/LABORATORY AND X-RAY DATA:  GENERAL:  She is  a well-developed, slightly obese woman without any acute distress, but  obviously appearing quite anxious.  VITAL SIGNS:  Her blood pressure is 111/68 with a pulse rate of 72 and  regular, respiratory rate of 18, and temperature was 96.8.  Her O2 sat  on room air was 94%.  HEENT:  Unremarkable.  NECK:  Supple and no jugular venous distention and no carotid bruits.  CHEST:  No splinting or focal tenderness.  LUNGS:  Clear and breath sounds were heard throughout.  CARDIAC:  S1 and S2 are normal.  No murmurs, gallops, or rubs.  ABDOMEN:  Obese, soft, and nontender.  No organomegaly and no masses.  EXTREMITIES:  No clubbing, cyanosis, and no edema.  No calf tenderness.  Negative Homans sign.  BACK:  There was some tenderness over the L4-5 vertebra.  No SI joint  tenderness and no CVA tenderness.  NEUROLOGIC:  She is  alert, oriented, and cooperative with fluent speech.  She had significant reduction in strength in both lower extremities,  barely able to move her toes and not able to support the legs against  gravity.  She has lost her reflexes bilaterally in the knees and ankles.  Strength in the upper extremities were completely normal as well as her  DTRs being 2+ bilaterally and symmetrical.   LABORATORY RESULTS:  Admission white count is 10,400 with hematocrit of  41.  Sed rate of 7.  Normal protime and PTT.  CMET is perfectly normal  with the exception of a random glucose of 140.  Her eGFR was greater  than 60, and her liver function parameters were completely normal.  There was a slight elevation in uric acid to 7.4.  Her CT levels were  normal at 78.  Free T4 slightly low at 7.74 and a TSH of 3.75.  Urinalysis; specific gravity of 1.015, pH of 5.5, trace hemoglobin,  protein of 100 mg percent, negative nitrites, leukocyte esterase, and no  cells were noted.  A repeat urinalysis 5 days later after having a Foley  catheter inserted revealed many epithelial cells, large leukocyte  esterase, negative nitrites, and a subsequent urine culture revealed  greater than 100,000 colonies of E. coli.   The patient was admitted and also followed by the Neurology Service with  her diagnostic workup revealing changes in the lumbar spine consistent  with an infarction of the conus medullaris.  Subsequent nerve conduction  studies and EMGs were cancelled.  The patient's clinical findings and x-  ray findings were fairly straight forward and felt to be most consistent  with an acute infarction of the conus medullaris and no further  diagnostic studies were initiated.  With her paraparesis, she also had  an accompanied paralysis of the bladder, requiring an indwelling  catheter and as noted above did have changes consistent with a urinary  tract infection with occult E. coli and the latter was treated with   Cipro 500 mg b.i.d.   A rehab consultation was requested and obtained and it was felt that the  patient was an excellent rehab candidate.  The patient was subsequently  transferred from the general medical floor to the Rehab Unit for further  treatment in a condition, which was slightly improved, but with a  prognosis that was considered to be guarded.  The patient said diabetes  was controlled quite well with the usual long-acting treatment with  Lantus and preprandial coverage using NovoLog therapy.   DISCHARGE DIET:  A 3-g sodium ,1600 calorie-carb modified, fat-  restricted diet.      Margaretmary Bayley, M.D.  Electronically Signed     PC/MEDQ  D:  01/24/2009  T:  01/25/2009  Job:  [id number]

## 2011-02-03 NOTE — Assessment & Plan Note (Signed)
Alexandra Wiley is back regarding her conus medullaris syndrome and paraplegia.  She has been home receiving home health therapy and progressing nicely.  Home health is hoping to transition her to outpatient therapies.  They  have therapy scheduled at home until May 13.  The patient has  experienced some improvement in her left leg strength especially.  She  is having pain, but states the pain is manageable with her oxycodone and  Neurontin.  She also uses Robaxin for p.r.n. spasms.  She saw Dr.  Retta Diones recently regarding her urinary retention is in good hand  there.  The patient uses a wheelchair for mobility and has a sliding  board for transfers.  There have not been any obvious safety issues at  home per her report.   REVIEW OF SYSTEMS:  Notable for the above.  She is on every other night  bowel program and doing fairly well without staying continent.  Gout has  not flared-up.  Diabetes is under reasonable control.  Full review is in  the written health and history section of the chart.   SOCIAL HISTORY:  The patient is married and currently at home.   PHYSICAL EXAMINATION:  VITAL SIGNS:  Blood pressure is 154/70, pulse is  105, respiratory rate 18, she is sating 95% on room air.  GENERAL:  The patient is pleasant, alert, and oriented x3.  Affect is  generally bright and appropriate.  EXTREMITIES:  Upper extremity strength is within normal limits with 2+  reflexes and good sensation.  Lower extremity sensation is decreased  below the waist.  She has 0/2 sensation below the knee except for the  medial aspect of the left foot.  Right lower extremity sensation is a  bit better at 1/2 throughout.  The patient's left hip and knee strength  is improved quite a bit.  Hip motor function is generally 2/5.  Knee  extension is 3/5.  Hamstring flexion 2+, 3/5, ankle plantar flexion  trace and ankle dorsiflexion 0/5 on the left.  Right ankle dorsiflexion,  plantar flexion also trace to 0/5.  Knee  extension 2/5, knee flexion  2/5, hip 2/5 on the right.  Reflexes are decreased in the knee and  ankles bilaterally.  The patient's skin is intact.  She does have some  mild swelling in right lower extremity 1+ to left lower extremity trace.  No frank allodynia is appreciated.  No skin breakdown is seen.  Cognitively, she is intact with good mood today.   ASSESSMENT:  1. 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  2. Lower extremity pain syndrome.  3. History of gout.  4. History of diabetes.   PLAN:  1. The patient is making nice progress.  I like to transition her to      outpatient physical therapy over the next week or two.  I think she      would do well in that environment, seems to be motivated.  We will      need to look at bilateral AFOs or perhaps a right AFO for support      once we get her moving a bit more.  2. Continue on oxycodone, Neurontin, and Robaxin for lower extremity      pain syndrome.  3. Urology follow up per Dr. Retta Diones.  4. I will see her back in about 2 months' time.  She is to call me      with any problems  or questions.      Ranelle Oyster, M.D.  Electronically Signed     ZTS/MedQ  D:  01/22/2009 12:52:48  T:  01/23/2009 02:11:10  Job #:  [id number]   cc:   Bertram Millard. Dahlstedt, M.D.  Fax: [phone]

## 2011-02-03 NOTE — H&P (Signed)
NAME:  Alexandra Wiley, Alexandra Wiley             ACCOUNT NO.:  [id number]   MEDICAL RECORD NO.:  [id number]          PATIENT TYPE:  IPS   LOCATION:  4009                         FACILITY:  [facility]   PHYSICIAN:  Ranelle Oyster, M.D.DATE OF BIRTH:  [birth year]   DATE OF ADMISSION:  11/26/2008  DATE OF DISCHARGE:                              HISTORY & PHYSICAL   PRIMARY PHYSICIAN:  Margaretmary Bayley, MD   NEUROLOGIST:  Deanna Artis. Hickling, MD   CHIEF COMPLAINT:  Lower extremity weakness and sensory loss.   HISTORY OF PRESENT ILLNESS:  This is a 65 year old African American  female with diabetes, hypertension who was admitted on November 18, 2008  with 1-day history of bilateral lower extremity cramps and abdominal  pain radiating to the buttock area.  She developed progressive weakness  in bilateral lower extremities.  Head CT was without acute infarct.  MRI  of the lumbar spine showed degenerative disk disease worse at L4-L5 and  L5-S1 with small epidural hematoma behind the L1 epidural space.  No  compression was seen.  Neurology was consulted for input and recommended  MRI at the thoracic spine for full workup of myopathy and acute  paraparesis.  X-rays revealed interval development of a distal thoracic  cord and conus hyperintensity consistent with acute cord infarct, stable  epidural process behind L1.  Neurology felt no further workup was  indicated and recovery depended on healing of the cord after infarct.   The patient was noted to have an E. coli UTI on admission and was  started on Cipro for treatment.  Foley catheter was placed on November 24, 2008, for ongoing urinary retention.  She has been incontinent of bowel  as well.  Therapies were initiated with good truncal control at the edge  of bed.  The patient still needs significant assistance with mobility  and self-care and thus Rehab was consulted who felt that she could  benefit from an inpatient stay.   REVIEW OF SYSTEMS:  Notable for  weakness, numbness, low-back pain,  retention of urine, incontinence, palpitations occasionally, leg pain,  and decreased sleep.  Other pertinent positives are as above and full  review is in the written H and P.   PAST MEDICAL HISTORY:  1. Positive for diabetes type 2 diagnosed in 2005.  2. Gouty arthritis.  3. Degenerative disk disease with epidural steroid injections in the      past years ago.  4. Hypertension.  5. Left tip-fib fracture with ORIF.  6. Left ankle fracture with ORIF.  7. 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   FAMILY HISTORY:  Positive for CAD.   SOCIAL HISTORY:  The patient is married, independent, helps to care of  her grandchildren during the day and works as a Copy in the evenings.  Lives in a tri-level house with 2 steps to enter and 5-6 steps to  bathroom.  She smokes 1 pack of tobacco per day.  She occasionally has  Bourbon.  Her husband and 2 children at home work days.   ALLERGIES:  None.   MEDICATIONS:  Lasix, metoprolol, Exforge, allopurinol, Indocin,  and  insulin p.r.n.   LABORATORY DATA:  Hemoglobin 13.0, white count 8.7, and platelets 346.  Sodium 139, potassium 3.9, BUN 19, creatinine 0.9, and uric acid 7.4.   PHYSICAL EXAMINATION:  VITAL SIGNS:  Blood pressure 105/50, pulse is 59,  respiratory rate 20, and temperature 98.3.  GENERAL:  The patient is pleasant sitting in bed.  She is bit flat.  Pupils are equally round and reactive to light.  EAR, NOSE, AND THROAT:  Unremarkable with fair-to-borderline dentition.  Mucosa is pink and moist.  NECK:  Supple without JVD or lymphadenopathy.  CHEST:  Clear to auscultation bilaterally without wheezes, rales, or  rhonchi.  HEART:  Regular rate and rhythm without murmurs, rubs, or gallops.  ABDOMEN:  Soft and nontender.  Bowel sounds are positive.  GAIT:  Not tested.  SKIN:  Generally intact.  NEUROLOGIC:  Cranial nerves II-XII are within normal limits.  Reflexes  are 2+ in the upper extremities and trace  in the lower extremities.  I  saw no resting tone.  Sensation was minimal to absent, essentially below  the L3 level.  She had a bit more preserved sensation on the right leg  than left.  Muscle motor function was trace in the right lower  extremity, hip to knee to foot.  Left lower extremity strength was 1+ to  2 at the knee and ankle today.  1+ at the hip.  Judgment, orientation,  and memory are all within functional limits.   POST ADMISSION PHYSICIAN EVALUATION:  1. Functional deficits secondary to conus medullaris syndrome/spinal      cord infarct.  The patient with paraparesis and sensory loss in      lower extremities.  2. The patient is admitted to receive collaborative interdisciplinary      care between the physiatrist, rehab nursing staff, and therapy      team.  3. The patient's level of medical complexity and substantial therapy      needs in context of that medical necessity cannot be provided at a      lesser intensity of care.  4. The patient has experienced substantial functional loss from her      baseline.  The patient was independent prior to arrival and      working.  As of rehab evaluation on November 22, 2008, when we      performed the initial consult, the patient had not been up with      therapy.  According to her most recent therapy evaluation within      the last 24 hours, she is mod assist for bed mobility, +2 total      assist (the patient 30%) with transferring.  Attempts at      transferring revealed a lot of lower extremity knee buckling.  The      patient was min assist for upper body ADLs, max assist for lower      body care, and max assist for toilet hygiene.  Judging by the      patient's diagnosis, physical exam, and functional history, the      patient has potential for functional progress which will result in      measurable gains while in inpatient rehab.  These gains will be of      substantial and practical use upon discharge to home in       facilitating mobility, self-care, etc.  Husband and family will      attempt to assist the patient once  home as well.  5. Physiatrist will provide 24-hour management of medical needs as      well as oversight of the therapy plan/treatment and provide      guidance as appropriate regarding interaction of the two.  Medical      problem list and plan are listed below.  6. 24-hour rehab nursing will assist in management of the patient's      neurogenic bowel and bladder.  We will attempt a scheduled bowel      program.  We will begin voiding trial over the next several days as      indicated as well and they will assist with that.  Therapy also      will help with pain management as well as skin care, nutrition,      medication administration, integration of therapy concepts, and      techniques.  7. PT will assess and treat for lower extremity strengthening, range      of motion, spasticity, control, appropriate splinting, and adaptive      equipment evaluation.  Goals are modified independent and likely at      the wheelchair level.  She may have some limited gait goals      depending on neurological recovery here in the acute phase.  8. OT will assess and treat for upper extremity strengthening,      adaptive modifications to her set up at home accounting for lower      extremity weakness and balance/bladder needs.  OT also will focus      on family education and safety awareness.  Goals overall are at the      supervision to modified independent level.  9. Case management/social worker will assess and treat for      psychosocial issues and discharge planning.  10.Team conferences will be held weekly to assess progress towards      goals and to determine barriers to discharge.  11.The patient has demonstrated sufficient medical stability and      exercise capacity to tolerate at least 3 hours of therapy per day      at least 5 days per week.  Estimated length of stay is 2-1/2 to 3       weeks.  Prognosis is good.   MEDICAL PROBLEM LIST AND PLAN:  1. Escherichia coli urinary tract infection:  Continue Cipro day 5 of      7.  Consider voiding trial once antibiotics are complete.  2. Hypertension:  Blood pressure continues to run a bit low.  Lasix      and Benicar dose recently were decreased.  We will monitor closely      for orthostasis and symptomatic hypotension.  May need binder and      TED stockings in light of her spinal cord issues.  We will place      p.r.n. hold on medications as well.  3. Diabetes type 2:  Continue before meals and at bedtime CBGs.  We      will cover with sliding scale insulin for now.  4. Deep venous thrombosis prophylaxis:  Continue TEDs and sequential      compression devices in light of her epidural hematoma.  May benefit      from baseline Doppler test.  Consider Lovenox as well as she is      several days out from noted hematoma.  5. Gouty arthritis:  We will resume Indocin per baseline and watch  closely for bleeding and side effects as well as GI issues.      Ranelle Oyster, M.D.  Electronically Signed     ZTS/MEDQ  D:  11/26/2008  T:  11/27/2008  Job:  [id number]

## 2011-02-06 NOTE — Discharge Summary (Signed)
NAME:  Alexandra Wiley, Alexandra Wiley                       ACCOUNT NO.:  [id number]   MEDICAL RECORD NO.:  [id number]                   PATIENT TYPE:  INP   LOCATION:  3701                                 FACILITY:  [facility]   PHYSICIAN:  Margaretmary Bayley, M.D.                 DATE OF BIRTH:  1946-01-03   DATE OF ADMISSION:  04/07/2004  DATE OF DISCHARGE:  04/15/2004                                 DISCHARGE SUMMARY   DISCHARGE DIAGNOSIS:  1.  Newly diagnosed type 2 diabetes mellitus with hyperosmolarity.  2.  Hyperkalemia.  3.  Hypovolemia.  4.  Hypotension.  5.  History of systemic hypertension.   REASON FOR ADMISSION:  Ms. Linville is a 65 year old African American woman  who was brought into the emergency room with a complaint of progressive  weakness and dizziness and light headedness  on standing.  The patient  states that she felt dehydrated, she had a dry mouth with increased thirst  and urination, but denied any change in her vision during this period of  time.  The patient states that three weeks prior to admission, she was given  a steroid injection to the knee and the left ankle by her orthopedist for  aggressive and significant pain and swelling in those joints.  She denies  any previous history of diabetes, but does admit to a family history.   PERTINENT PHYSICAL FINDINGS:  She is a well developed, obese African  American woman, there is no fruity odor noted.  Her blood pressure is 84/66,  pulse 120, respiratory rate 22 to 24, temperature 97.4.  Her skin is cool  and dry.  Her pharynx is without exudates.  Funduscopic exam shows no  hemorrhages, exudates, or microaneurysm.  Neck supple with no adenopathy or  thyromegaly.  Lungs clear.  Cardiac exam shows a regular rate and rhythm, no  gallops or rubs.  Abdomen is obese, soft, nontender.  She does have  diminution in her bowel sounds but they are present.  Pelvic exam:  She had  cheesy exudates consistent with Monilial vulvovaginitis.   Extremities:  She  had mild tenderness with a decreased range of motion of the left knee and  ankle.  She had an old surgical scar of the left ankle, no locking of the  joints.  Her back is without any focal spinal or CVA tenderness.  Neurologically, she is alert, oriented, cooperative, no focal sensory,  motor, or reflex deficit.   LABORATORY DATA:  Admission lab data is remarkable for a reduction in her  serum sodium to 115, potassium elevated at 5.7, glucose 1050 with a BUN 41.   HOSPITAL COURSE:  The patient is admitted with nonketotic hyperosmolar newly  diagnosed type 2 diabetes mellitus, hypotension secondary to dehydration,  hyperkalemia, with total body potassium depletion related to her  uncontrolled diabetic state, abdominal pain of uncertain etiology to rule  out pancreatitis and other etiologies.  The patient was admitted to a  telemetry bed where she was treated with aggressive volume repletion with  normal saline and the Glucomander protocol used for treatment of her  markedly hyperglycemic state.  Over the initial 24 hours, her blood sugars  were brought down to a more manageable state while she was getting  replacement of her sodium.  Because of her hypotension, hyponatremia, and  hyperkalemia, and her diabetes, she was treated empirically for adrenal  insufficiency with pharmacologic doses of Solu-Medrol.  A subsequent  cortisol, however, came back greater than 40 and her IV hydrocortisone was  discontinued.  Within the first 36 hours, the patient was started on a carb  modified sodium restricted diet.  Glucomander was discontinued and she was  started on Lantus insulin combined with a fractional schedule of Novolog  based on her preprandial blood sugars.   During her hospital stay, although she had no chest pain, serial enzymes and  EKGs were obtained and found to be negative for an acute myocardial  infarction.  Blood cultures were negative as was her rheumatoid  factor and  ANA.  Her abdominal workup was negative for acute or chronic pancreatitis,  there was no evidence of calcifications noted.  It became obvious that the  patient would require insulin therapy on discharge and with the assistance  of diabetes coordinator, the patient adjusted to giving herself insulin  injections very quickly with good technique.  In addition, she received  instructions on home glucose monitoring and appropriate dietary compliance.  The remainder of her hospitalization was unremarkable.  She was subsequently  discharged home on a b.i.d. regimen and Novolog 70/30 of 20 units before  supper nightly and a fractional schedule based on her fasting blood sugars.  The patient is scheduled to be seen back in the office in two weeks and she  is to have follow up in the diabetes and nutritional management center three  weeks post discharge.  The patient is instructed to resume treatment with  her Norvasc at 5 mg per day, Allopurinol 150 mg daily, and a discharge diet  3 gram sodium, 1400 calorie carb modified, fat restricted diet.  Her  discharge condition is significantly improved and her prognosis is  considered to be quite good.  The patient was instructed on a possibility of  her insulin regimen being reduced as the affects of the steroids waned.                                                Margaretmary Bayley, M.D.    PC/MEDQ  D:  05/29/2004  T:  05/29/2004  Job:  [id number]

## 2011-02-06 NOTE — Discharge Summary (Signed)
NAMEMarland Wiley  Alexandra, Wiley             ACCOUNT NO.:  [id number]   MEDICAL RECORD NO.:  [id number]          PATIENT TYPE:  IPS   LOCATION:  4009                         FACILITY:  [facility]   PHYSICIAN:  Ranelle Oyster, M.D.DATE OF BIRTH:  1946-06-07   DATE OF ADMISSION:  11/26/2008  DATE OF DISCHARGE:  12/15/2008                               DISCHARGE SUMMARY   DISCHARGE DIAGNOSES:  1. Conus medullaris syndrome, secondary to spinal cord infarct.  2. History of gout.  3. Renal insufficiency, resolved.  4. Hypertension  5. Diabetes mellitus type 2.   HISTORY OF PRESENT ILLNESS:  Alexandra Wiley is a 65 year old female with  history of diabetes mellitus type 2, hypertension admitted on March 28,  with 1 day history of bilateral lower extremity cramp with abdominal  pain radiating to buttocks and progressive bilateral lower extremity  weakness.  CT of head done showed no acute infarct.  MRI of L-spine  showed DJD worst at at L4-L5 and L5-S1 with small epidural hematoma  behind L1 epidural space.  No compression.  Neuro was consulted for  input and recommended imaging of the thoracic spine for full workup of  myelopathy and acute paraparesis.  MRI revealed increased signal in the  distal thoracic cord and conus.  Hypointensity compatible with complete  body infarct, stable epidural process at L1.  Neuro film, no further  workup indicated and for her spinal cord infarct and recovery would  depend on healing with time.  The patient was also noted to have  E.  coli UTI on admission and was started on Cipro for treatment.  For bed  mobility and transfer the patient required maximum assistance and needed  cues to refrain from posterior leaning.  She has had some improvement on  right lower extremity splint.  The patient was evaluated by rehab and  felt to be an appropriate candidate for inpatient rehab stay.   PAST MEDICAL HISTORY:  1. Diabetes mellitus type 2.  2. Gouty arthritis.  3. 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  4. Hypertension.  5. History of left hip fracture with ORIF, left ankle ORIF.  6. History of MI several years ago.   ALLERGIES:  No known drug allergies.   REVIEW OF SYMPTOMS:  Positive for weakness and numbness as well as lower  back pain, problems with incontinence of bowel, occasional palpation,  decreased sleep.   FAMILY HISTORY:  Positive for pulmonary vascular disease.   SOCIAL HISTORY:  The patient is married and was independent and working  as a Copy in evenings.  Takes care of the grandchild during the day.  Lives in tri-level home with 2 steps at entry, 5 to 6 steps at bedroom.  Smokes 1 pack per day.  Alcohol:  Uses bourbon occasionally.  children  live with her and work daytime.   FUNCTIONAL HISTORY:  The patient was independent prior to admission.   FUNCTIONAL STATUS:  The patient is mod assist for bed mobility, max to  total assist 30% for transfers for transfer attempts with bilateral  lower buckling, min assist for upper  body care, max assist for lower  body care, and max assist for toileting.   PHYSICAL EXAMINATION:  VITAL SIGNS:  Blood pressure 107/50, pulse 59,  respiratory rate 20, and temperature 98.3.  GENERAL:  The patient is a pleasant, obese female in no acute distress.  HEENT:  Atraumatic and normocephalic.  Pupils round, and reactive to  light.  Oral mucosa moist with pink mucosa.  NECK:  Supple without JVD or lymphadenopathy.  HEART:  Regular rate rhythm without murmurs, rubs, or gallops.  ABDOMEN:  Soft and nontender with positive bowel sounds.  SKIN:  Intact.  Lower extremities show chronic vascular changes.  No  breakdown.  NEUROLOGIC:  Cranial nerves II through XII within normal limit.  Reflexes are 2+ in upper extremities, trace in lower extremities.  sensation minimal to absent in the left lower extremity preserved  sensation in the right lower extremity.  Muscle motor function is trace   in right lower extremity, absent at the foot, left lower extremity is 1+  with 2 at knees and ankles 1+ at hip.  Intact orientation and memory.   HOSPITAL COURSE:  Ms. Alexandra Wiley was admitted to rehab on November 26, 2008, for inpatient therapies to consist of PT/OT at least 3 hours 5  days a week.  Past admission, physiatrist, rehab RN, and therapy team  have worked together to provide customized collaborative  interdisciplinary care.  The patient was maintained on Cipro for 7 total  days of antibiotic therapy with March 10 due to the nature of her  treatment course.  A repeat urine culture was done on March 11, to check  efficacy of treatment and urine culture showed no growth.  Labs done  past admission revealed hemoglobin 13.0, hematocrit 38.5, white count  8.7, and platelets 346.  Check of lytes revealed sodium 139, potassium  3.9, chloride 100, CO2 of 28, BUN 19, creatinine 0.95, and glucose 106.  The patient's blood pressures were monitored on b.i.d. basis during the  stay.  The patient was noted to have issues with orthostasis initially.  She was noted to have dizziness with mobility and Benicar was  discontinued.  The patient was also noted to have some issues with  bradycardia, metoprolol was decreased to 12.5 mg per day.  Heart rate at  the time of discharge is in 90s range.  The patient's lytes were  rechecked on March 12, secondary to some complaints of increased  dizziness.  She was noted to be dehydrated with BUN at 38, creatinine at  1.25.  She was also noted to have potassium at 5.1.  She was treated  with Kayexalate and Lasix was discontinued.  She was encouraged to push  p.o. fluid.  Repeat lytes have been were done on March 13, as well as  March 16 last.  This showed renal status to be much improved with sodium  135, potassium 4.8, chloride 100, CO2 of 24, BUN 22, creatinine 0.9,  with glucose 113.  The patient's diabetes was monitored with a.c. and  bedtime CBG  check.  The patient was maintained on carb modified diet  with blood sugars overall ranging from 90-120s range.   During the patient's stay in CIR, rehab RN has been working with the  patient on bowel and bladder training.  The patient's bowel program was  adjusted to include suppository every other day at bedtime to assist  evacuation as well as prevent accidents.  Foley was discontinued on  March 15, and  voiding trial was initiated.  The patient was noted to  have urinary retention despite addition of Urecholine and Flomax.  Therefore, these were discontinued on March 24.  The patient has been  instructed on in and out cath.  Rehab RN has been instructing the  patient as well as providing input on positioning to help improve  patient's ease with in and out catheterization.  The patient did  continue to require some assistance with her bowel program, and the  patient is able to direct family to help with this.  During the  patient's stay in rehab, weekly team conferences will help to monitor  patient's progress, set goals as well as discuss barriers to discharge.  Initially, the patient orthostasis as well as decrease endurance level  and difficulty initiating subscap were noted to be a good question to be  a barrier.  With improvement in the patient's endurance and activity  tolerance, the patient has made great progress during the stay.  PT  evaluation at the time of admission revealed the patient at min to mod  assist to get from supine to sitting with momentum to break bilateral  lower extremity with the edge of bed.  She tended  to have posterior  lean with some minimal complaints of dizziness with sitting.  She is  able to perform transfers, plus total assist 85% for safety.  She was  min to mod assist for planning both transfers, requiring mod assist for  transfers uphill.  The patient was required leg loops for bilateral  lower extremity to help improve independence with moving  lower  extremities.  Physical therapy has been working with patient on balance  and endurance as well as focusing on upper extremity strengthening with  upper extremity exercise group.  The patient has progressed along to  being at modified independent for bed mobility as well as bed to  wheelchair transfers.  She is modified independent for navigating her  wheelchair greater than 200 feet, able to practice car transfers with  the patient giving all verbal directions and cues for sequencing of  wheelchair box management and with min assist with sliding board to  transferring into the car.  Standing do help with weightbearing of lower  extremity strengthening has been practiced in standing frame with the  patient able to stand for 15 minutes with improvement in bilateral lower  extremity and  truncal control.  PT is also being working with the  patient, dynamic routine exercises to help maintain her upright posture.  The patient's family was unable to participate or be available for a  family education with PT or OT.  However, the patient is able to provide  instructions for assistance thus needed with car transfers and is  modified independent overall for mobility.  The patient has made  excellent progress in OT to being a modified independent level for the  simple home management task at wheelchair level.  She is modified  independent for bathing and dressing.  She does require some supervision  for toileting and toilet transfers.  The patient do have intermittent  assistance throughout the day, but will have assistance for ADL and  toileting as well as bowel program by family past discharge.  The  patient will additionally continue to receive further followup home  health, PT/OT past discharge.  Of note, bilateral lower extremity  Dopplers were done past admission on March 11, and this showed no  evidence of DVT.  The patient was  started on subcu Lovenox for DVT  prophylaxis during the  stay and is to continue on this for 3 total  months of anticoagulation therapy.  On December 14, 2008, the patient was  discharged to home.   DISCHARGE MEDICATIONS:  1. Allopurinol 100 mg a day.  2. Neurontin 100 mg q.8 h.  3. Pamelor 10 mg nightly.  4. Atenolol 25 mg half p.o. per day.  5. Robaxin 500 mg q.6 h. p.r.n. spasm.  6. Oxycodone IR 5 mg 1-2 q.6-8 h. p.r.n. pain #60 Rx.  7. Prilosec OTC 1 per day.  8. Multivitamin 1 per day.  9. Lovenox 30 mg subcu. q.12 h. through February 20, 2009.  10.Dulcolax suppository 1 per rectum every other day.   DIET:  Carb modified.   Activity level is at intermittent supervision.   SPECIAL INSTRUCTIONS:  In and out cath every 6-8 hours, keep bladder  volumes below 350 mL.  Do not use furosemide, Indocin or Exforge.  [facility] to provide PT/OT and RN.   FOLLOWUP:  The patient to follow up with Dr. Riley Kill on May 4, at 11:30.  Follow up with Dr. Sharene Skeans in 1 month.  Follow up with Dr. Chestine Spore in 2  weeks.  Follow up with Dr. Sherron Monday, Urology April 28, at 9:15 a.m.  for 9:30 appointment.      Greg Cutter, P.A.      Ranelle Oyster, M.D.  Electronically Signed    PP/MEDQ  D:  12/26/2008  T:  12/27/2008  Job:  [id number]   cc:   Deanna Artis. Sharene Skeans, M.D.  Dr. Unknown Jim, MD

## 2011-02-18 ENCOUNTER — Ambulatory Visit: Payer: BC Managed Care – PPO | Admitting: *Deleted

## 2011-02-23 ENCOUNTER — Ambulatory Visit: Payer: BC Managed Care – PPO | Attending: Physical Medicine & Rehabilitation | Admitting: Physical Therapy

## 2011-02-23 DIAGNOSIS — M6281 Muscle weakness (generalized): Secondary | ICD-10-CM | POA: Insufficient documentation

## 2011-02-23 DIAGNOSIS — R262 Difficulty in walking, not elsewhere classified: Secondary | ICD-10-CM | POA: Insufficient documentation

## 2011-02-23 DIAGNOSIS — IMO0001 Reserved for inherently not codable concepts without codable children: Secondary | ICD-10-CM | POA: Insufficient documentation

## 2011-02-25 ENCOUNTER — Ambulatory Visit: Payer: BC Managed Care – PPO | Admitting: *Deleted

## 2011-03-02 ENCOUNTER — Ambulatory Visit: Payer: BC Managed Care – PPO | Admitting: *Deleted

## 2011-03-04 ENCOUNTER — Ambulatory Visit: Payer: BC Managed Care – PPO | Admitting: *Deleted

## 2011-03-11 ENCOUNTER — Other Ambulatory Visit: Payer: Self-pay | Admitting: Physical Medicine & Rehabilitation

## 2011-03-11 ENCOUNTER — Ambulatory Visit: Payer: BC Managed Care – PPO | Admitting: Physical Therapy

## 2011-03-11 ENCOUNTER — Ambulatory Visit (HOSPITAL_COMMUNITY)
Admission: RE | Admit: 2011-03-11 | Discharge: 2011-03-11 | Disposition: A | Discharge status: Home / Self Care | Payer: BC Managed Care – PPO | Source: Ambulatory Visit | Attending: Physical Medicine & Rehabilitation | Admitting: Physical Medicine & Rehabilitation

## 2011-03-11 DIAGNOSIS — T148XXA Other injury of unspecified body region, initial encounter: Secondary | ICD-10-CM

## 2011-03-11 DIAGNOSIS — M25519 Pain in unspecified shoulder: Secondary | ICD-10-CM | POA: Insufficient documentation

## 2011-03-11 DIAGNOSIS — M19019 Primary osteoarthritis, unspecified shoulder: Secondary | ICD-10-CM | POA: Insufficient documentation

## 2011-03-11 DIAGNOSIS — M899 Disorder of bone, unspecified: Secondary | ICD-10-CM | POA: Insufficient documentation

## 2011-03-16 ENCOUNTER — Ambulatory Visit: Payer: BC Managed Care – PPO | Admitting: *Deleted

## 2011-03-18 ENCOUNTER — Ambulatory Visit: Payer: BC Managed Care – PPO | Admitting: *Deleted

## 2011-03-20 ENCOUNTER — Ambulatory Visit: Payer: BC Managed Care – PPO | Admitting: *Deleted

## 2011-03-23 ENCOUNTER — Ambulatory Visit: Payer: BC Managed Care – PPO | Attending: Physical Medicine & Rehabilitation | Admitting: *Deleted

## 2011-03-23 DIAGNOSIS — M6281 Muscle weakness (generalized): Secondary | ICD-10-CM | POA: Insufficient documentation

## 2011-03-23 DIAGNOSIS — IMO0001 Reserved for inherently not codable concepts without codable children: Secondary | ICD-10-CM | POA: Insufficient documentation

## 2011-03-23 DIAGNOSIS — R262 Difficulty in walking, not elsewhere classified: Secondary | ICD-10-CM | POA: Insufficient documentation

## 2011-03-27 ENCOUNTER — Ambulatory Visit: Payer: BC Managed Care – PPO | Admitting: Physical Therapy

## 2011-03-30 ENCOUNTER — Ambulatory Visit: Payer: BC Managed Care – PPO | Admitting: *Deleted

## 2011-04-01 ENCOUNTER — Ambulatory Visit: Payer: BC Managed Care – PPO | Admitting: Physical Therapy

## 2011-04-07 ENCOUNTER — Ambulatory Visit: Payer: BC Managed Care – PPO | Admitting: Physical Therapy

## 2011-04-08 ENCOUNTER — Encounter
Payer: BC Managed Care – PPO | Attending: Physical Medicine & Rehabilitation | Admitting: Physical Medicine & Rehabilitation

## 2011-04-09 ENCOUNTER — Ambulatory Visit: Payer: BC Managed Care – PPO | Admitting: Physical Therapy

## 2011-04-13 ENCOUNTER — Ambulatory Visit: Payer: BC Managed Care – PPO | Admitting: Physical Therapy

## 2011-04-16 ENCOUNTER — Ambulatory Visit: Payer: BC Managed Care – PPO | Admitting: Physical Therapy

## 2011-04-20 ENCOUNTER — Ambulatory Visit: Payer: BC Managed Care – PPO | Admitting: Physical Therapy

## 2011-04-23 ENCOUNTER — Ambulatory Visit: Payer: BC Managed Care – PPO | Attending: Physical Medicine & Rehabilitation | Admitting: Physical Therapy

## 2011-04-23 ENCOUNTER — Ambulatory Visit: Payer: BC Managed Care – PPO | Admitting: Occupational Therapy

## 2011-04-23 DIAGNOSIS — R262 Difficulty in walking, not elsewhere classified: Secondary | ICD-10-CM | POA: Insufficient documentation

## 2011-04-23 DIAGNOSIS — IMO0001 Reserved for inherently not codable concepts without codable children: Secondary | ICD-10-CM | POA: Insufficient documentation

## 2011-04-23 DIAGNOSIS — M6281 Muscle weakness (generalized): Secondary | ICD-10-CM | POA: Insufficient documentation

## 2011-04-27 ENCOUNTER — Ambulatory Visit: Payer: BC Managed Care – PPO | Admitting: Physical Therapy

## 2011-04-30 ENCOUNTER — Ambulatory Visit: Payer: BC Managed Care – PPO | Admitting: Physical Therapy

## 2011-05-01 ENCOUNTER — Encounter: Payer: BC Managed Care – PPO | Admitting: Occupational Therapy

## 2011-05-05 ENCOUNTER — Encounter: Payer: Self-pay | Admitting: Vascular Surgery

## 2011-05-06 ENCOUNTER — Encounter: Payer: BC Managed Care – PPO | Admitting: Occupational Therapy

## 2011-05-07 ENCOUNTER — Ambulatory Visit: Payer: BC Managed Care – PPO | Admitting: Occupational Therapy

## 2011-05-12 ENCOUNTER — Encounter: Payer: BC Managed Care – PPO | Admitting: Occupational Therapy

## 2011-05-13 ENCOUNTER — Encounter: Payer: BC Managed Care – PPO | Attending: Physical Medicine & Rehabilitation | Admitting: Neurosurgery

## 2011-05-13 DIAGNOSIS — M79609 Pain in unspecified limb: Secondary | ICD-10-CM | POA: Insufficient documentation

## 2011-05-13 DIAGNOSIS — M1710 Unilateral primary osteoarthritis, unspecified knee: Secondary | ICD-10-CM

## 2011-05-13 DIAGNOSIS — G8220 Paraplegia, unspecified: Secondary | ICD-10-CM | POA: Insufficient documentation

## 2011-05-13 DIAGNOSIS — N319 Neuromuscular dysfunction of bladder, unspecified: Secondary | ICD-10-CM

## 2011-05-13 DIAGNOSIS — M109 Gout, unspecified: Secondary | ICD-10-CM | POA: Insufficient documentation

## 2011-05-13 DIAGNOSIS — G9589 Other specified diseases of spinal cord: Secondary | ICD-10-CM | POA: Insufficient documentation

## 2011-05-13 DIAGNOSIS — K592 Neurogenic bowel, not elsewhere classified: Secondary | ICD-10-CM | POA: Insufficient documentation

## 2011-05-13 NOTE — Assessment & Plan Note (Signed)
Account [id number].  This is a patient of Dr. Riley Kill seen for spinal cord injury.  She has finished her physical therapy stating that she had plateaued and her insurance had maxed out.  She states she is walking more and feels like she is a little stronger and is overall happy as of right now.  She rates her pain at 8 or 9.  It is a sharp, burning, tingling pain. Activity level is 6.  Pain is worse in morning and night.  Sleep pattern is fair.  Pain is worse with walking, sitting, standing.  Medication tends to help.  She uses a walker and wheelchair.  She is in wheelchair today.  She does not climb steps or drive.  She does transfer alone. She would like to gain some type of employment at some point.  REVIEW OF SYSTEMS:  Notable for those difficulties as well as some numbness, tremors, spasm, dizziness, no suicidal thoughts or aberrant behaviors.  PAST MEDICAL HISTORY:  Unchanged.  SOCIAL HISTORY:  Married.  FAMILY HISTORY:  Unchanged.  PHYSICAL EXAMINATION:  VITAL SIGNS:  Her blood pressure is 95/59, pulse 75, respirations 18, O2 sats 95 on room air.  Her motor strength appears to be about 4 to 4+/5 in the lower extremities bilaterally.  She can barely move either foot as far as the plantar flexion.  She is in AFOs today and in her wheelchair again. Since exam shows that her sensation is diminished in the upper and lower extremities, more so on the lower extremities.  Her reflexes are absent.  ASSESSMENT: 1. History of conus medullaris syndrome with paraplegia. 2. Neurogenic bowel and bladder. 3. Neuropathic lower pain. 4. Osteoarthritis of the knee. 5. History of gout.  PLAN: 1. She will continue working on [employer]. 2. Oxycodone refill 5 mg one p.o. q.12 h p.r.n. 45 with no refill. 3. She will follow up with Dr. Riley Kill in 1 month.  Her questions were     encouraged and answered.     Macdonald Rigor L. Blima Dessert Electronically Signed    RLW/MedQ D:  05/13/2011  11:55:12  T:  05/13/2011 19:36:53  Job #:  [id number]

## 2011-05-14 ENCOUNTER — Encounter: Payer: BC Managed Care – PPO | Admitting: Occupational Therapy

## 2011-05-18 ENCOUNTER — Encounter: Payer: BC Managed Care – PPO | Admitting: Occupational Therapy

## 2011-05-19 ENCOUNTER — Encounter: Payer: BC Managed Care – PPO | Admitting: Occupational Therapy

## 2011-05-20 NOTE — Progress Notes (Signed)
[facility] [place]  Referred by: Dr. Elvin So  Reason for referral: Non-healing right great toe  History of Present Illness  Alexandra Wiley is a 65 y.o. female who presents with chief complaint: non-healing right great toe.  Patient noted the toe ulcer about one month ago.  She thinks it may be related shoewear abrasion.  She denies any pain.  She also notes no intermittent claudication or rest pain symptoms.  The toe has not healed despite local wound care.  Atherosclerotic risk factors include: HTN, DM. Smoking  Past Medical History  Diagnosis Date  . Stroke   . Ulcer   . Diabetes mellitus   . Arthritis   . Myocardial infarction   . Hypertension   . Heart murmur   . Gout   . PVD (peripheral vascular disease)   . Pain in limb   . CHF (congestive heart failure)     No past surgical history on file.  History   Social History  . Marital Status: Married    Spouse Name: N/A    Number of Children: N/A  . Years of Education: N/A   Occupational History  . Not on file.   Social History Main Topics  . Smoking status: Current Everyday Smoker -- 0.2 packs/day  . Smokeless tobacco: Not on file  . Alcohol Use: No  . Drug Use: No  . Sexually Active:    Other Topics Concern  . Not on file   Social History Narrative  . No narrative on file    Family History  Problem Relation Age of Onset  . Arthritis Mother   . Hypertension Mother   . Heart disease Father   . Diabetes Father     Current outpatient prescriptions:allopurinol (ZYLOPRIM) 100 MG tablet, Take 100 mg by mouth daily.  , Disp: , Rfl: ;  amLODipine (NORVASC) 5 MG tablet, Take 5 mg by mouth daily.  , Disp: , Rfl: ;  gabapentin (NEURONTIN) 300 MG capsule, Take 300 mg by mouth 4 (four) times daily.  , Disp: , Rfl: ;  HYDROCHLOROTHIAZIDE PO, Take 12.5 mg by mouth daily. , Disp: , Rfl:  insulin aspart protamine-insulin aspart (NOVOLOG 70/30) (70-30) 100 UNIT/ML injection, Inject into the skin as  directed.  , Disp: , Rfl: ;  levothyroxine (SYNTHROID, LEVOTHROID) 75 MCG tablet, Take 75 mcg by mouth daily.  , Disp: , Rfl: ;  methocarbamol (ROBAXIN) 500 MG tablet, Take 500 mg by mouth 4 (four) times daily.  , Disp: , Rfl: ;  METOPROLOL TARTRATE PO, Take 50 mg by mouth daily. , Disp: , Rfl:  metoprolol-hydrochlorothiazide (LOPRESSOR HCT) 100-25 MG per tablet, Take 1 tablet by mouth daily. 1/2 tablet daily , Disp: , Rfl: ;  nitrofurantoin (MACRODANTIN) 100 MG capsule, Take 100 mg by mouth daily.  , Disp: , Rfl: ;  nortriptyline (PAMELOR) 10 MG capsule, Take 10 mg by mouth at bedtime.  , Disp: , Rfl: ;  omeprazole (PRILOSEC) 20 MG capsule, Take 20 mg by mouth daily.  , Disp: , Rfl:  oxybutynin (DITROPAN-XL) 10 MG 24 hr tablet, Take 10 mg by mouth daily.  , Disp: , Rfl:   Allergies as of 05/22/2011  . (No Known Allergies)    Review of Systems (Positive items in bold and italic, otherwise negative)  General: Weight loss, Weight gain, Loss of appetite, Fever  Neurologic: Dizziness, Blackouts, Headaches, Seizure  Ear/Nose/Throat: Change in eyesight, Change in hearing, Nose bleeds, Sore throat  Vascular: Pain in  legs with walking, Pain in feet while lying flat, Non-healing ulcer, Stroke, "Mini stroke", Slurred speech, Temporary blindness, Blood clot in vein, Phlebitis  Pulmonary: Home oxygen, Productive cough, Bronchitis, Coughing up blood, Asthma, Wheezing  Musculoskeletal: Arthritis, Joint pain, Muscle pain  Cardiac: Chest pain, Chest tightness/pressure, Shortness of breath when lying flat, Shortness of breath with exertion, Palpitations, Heart murmur, Arrythmia, Atrial fibrillation  Hematologic: Bleeding problems, Clotting disorder, Anemia  Psychiatric:  Depression, Anxiety, Attention deficit disorder  Gastrointestinal:  Black stool, Blood in stool, Peptic ulcer disease, Reflux, Hiatal hernia, Trouble swallowing, Diarrhea, Constipation  Urinary:  Kidney disease, Burning with urination,  Frequent urination, Difficulty urinating  Skin: Ulcers, Rashes  Physical Examination  Filed Vitals:   05/22/11 1625  BP: 126/74  Pulse: 72  Resp: 20    General: A&O x 3, WDWN  Head: [place]/AT  Ear/Nose/Throat: Hearing grossly intact, nares w/o erythema or drainage, oropharynx w/o Erythema/Exudate  Eyes: PERRLA, EOMI  Neck: Supple, no nuchal rigidity, no palpable LAD  Pulmonary: Sym exp, good air movt, CTAB, no rales, rhonchi, & wheezing  Cardiac: RRR, Nl S1, S2, no rubs or gallops, grade 2 systolic murmur  Vascular: Vessel Right Left  Radial Palpable Palpable  Brachial Palpable Palpable  Carotid Palpable, without bruit Palpable, without bruit  Aorta Non-palpable N/A  Femoral Not Palpable due to pannus Not Palpable due to pannus  Popliteal Non-palpable Non-palpable  PT Palpable Palpable  DP Palpable Palpable   Gastrointestinal: soft, NTND, -G/R, - HSM, - masses, - CVAT B, morbidly obese  Musculoskeletal: M/S 5/5 throughout except BLE 3-4/5 (R>L), Extremities with small amt of black eschar R 1st and 2nd toe tip and L 2nd toe, ulcer on R great toe  Neurologic: CN 2-12 intact , Pain and light touch intact in extremities , Motor exam as listed above  Psychiatric: Judgment intact, Mood & affect appropriatefor pt's clinical situation  Dermatologic: See M/S exam for extremity exam, no rashes otherwise noted  Lymph : No Cervical, Axillary, or Inguinal lymphadenopathy   Non-Invasive Vascular Imaging  ABI (Date: 05/22/11)  RLE: 0.59, PT and DP: mono, DBI 0.30  LLE: 0.70, PT and DP: mono, DBI 0.47   Outside Studies/Documentation 5 pages of outside documents were reviewed including: Podiatry H&P  Medical Decision Making  Alexandra Wiley is a 65 y.o. female who presents with: BLE PAD.     With the patient's long term diabetes, BLE ABI unlikely to be accurate.    Regardless of the calcification, the TBI suggest she should be able to heal an intervention on the right  great toe.  I have offered to the patient an Aortogram and Bilateral leg runoff to better determine the extent of this patient's atherosclerotic disease in her legs.  Thank you for allowing [place] to participate in this patient's care.  Leonides Sake, MD [facility] [place] Office: [phone] Pager: [phone]

## 2011-05-21 ENCOUNTER — Encounter: Payer: Self-pay | Admitting: Vascular Surgery

## 2011-05-22 ENCOUNTER — Encounter: Payer: Self-pay | Admitting: Vascular Surgery

## 2011-05-22 ENCOUNTER — Ambulatory Visit (INDEPENDENT_AMBULATORY_CARE_PROVIDER_SITE_OTHER): Payer: BC Managed Care – PPO | Admitting: Vascular Surgery

## 2011-05-22 ENCOUNTER — Encounter (INDEPENDENT_AMBULATORY_CARE_PROVIDER_SITE_OTHER): Payer: BC Managed Care – PPO

## 2011-05-22 VITALS — BP 126/74 | HR 72 | Resp 20

## 2011-05-22 DIAGNOSIS — I739 Peripheral vascular disease, unspecified: Secondary | ICD-10-CM

## 2011-05-22 DIAGNOSIS — L97509 Non-pressure chronic ulcer of other part of unspecified foot with unspecified severity: Secondary | ICD-10-CM

## 2011-05-28 ENCOUNTER — Ambulatory Visit (HOSPITAL_COMMUNITY)
Admission: RE | Admit: 2011-05-28 | Payer: BC Managed Care – PPO | Source: Ambulatory Visit | Admitting: Vascular Surgery

## 2011-06-15 ENCOUNTER — Encounter
Payer: BC Managed Care – PPO | Attending: Physical Medicine & Rehabilitation | Admitting: Physical Medicine & Rehabilitation

## 2011-06-15 DIAGNOSIS — M109 Gout, unspecified: Secondary | ICD-10-CM | POA: Insufficient documentation

## 2011-06-15 DIAGNOSIS — K592 Neurogenic bowel, not elsewhere classified: Secondary | ICD-10-CM | POA: Insufficient documentation

## 2011-06-15 DIAGNOSIS — N319 Neuromuscular dysfunction of bladder, unspecified: Secondary | ICD-10-CM | POA: Insufficient documentation

## 2011-06-15 DIAGNOSIS — G8220 Paraplegia, unspecified: Secondary | ICD-10-CM | POA: Insufficient documentation

## 2011-06-15 DIAGNOSIS — M1710 Unilateral primary osteoarthritis, unspecified knee: Secondary | ICD-10-CM | POA: Insufficient documentation

## 2011-06-15 DIAGNOSIS — S343XXA Injury of cauda equina, initial encounter: Secondary | ICD-10-CM

## 2011-06-15 DIAGNOSIS — G9589 Other specified diseases of spinal cord: Secondary | ICD-10-CM | POA: Insufficient documentation

## 2011-06-15 DIAGNOSIS — L989 Disorder of the skin and subcutaneous tissue, unspecified: Secondary | ICD-10-CM | POA: Insufficient documentation

## 2011-06-15 NOTE — Assessment & Plan Note (Signed)
HISTORY:  Alexandra Wiley is back regarding her chronic spinal cord injury/spinal cord infarct.  She has had some problems develop with the right first toe due to contact with her shoe.  States that the insole was not replaced in her shoe and she had breakdown.  Apparently, she was slow healing and ultimately ended up seeing Vascular Surgery.  She hints to me today that they are recommending revascularization procedure, although I do not know any details.  Her pain is about 6-8/10, it is most prominent in the left leg at the knee up into the left thigh and hip.  She seems to be acquit with spasm type of pain.  She denies frank back pain.  Pain does not seem to be activity related.  She is on increased Robaxin which helps somewhat, but has not helped as much over the last few weeks.  Continue to have issues of bowel and bladder control.  REVIEW OF SYSTEMS:  Full 12-point review is in the written health and history section of the chart.  She does report that the toe which is slowly healing on the right foot.  SOCIAL HISTORY:  The patient is married.  Family seems to involve still with her care.  PHYSICAL EXAMINATION:  VITAL SIGNS:  Blood pressure 92/61, pulse 77, respiratory rate 18, and she is satting 97% on room air. GENERAL:  The patient is pleasant and alert.  She is sitting in a wheelchair today and seems to have significant distal weakness at both ankles.  She has stable knee extension and hip flexion today.  She has diminished sensation either side with 1/2 touch noted.  Reflexes are 1+. There is no resting tone.  No contractures.  I did not remove her dressing over the right foot.  She was wearing a protective boot today as well as sock.  Cognitively, she is alert and appropriate. HEART:  Regular. CHEST:  Clear. ABDOMEN:  Soft and nontender.  ASSESSMENT: 1. History of conus medullaris syndrome with paraplegia. 2. Neurogenic bowel and bladder. 3. Osteoarthritis of the left knee. 4.  History of gout. 5. Breakdown on right first toe.  PLAN: 1. I will be happy to review studies done to help with decision     regarding vascular procedure.  At this point, if she is healing     probably the best avenues to stay conservative, however, I do not     know any supporting studies that were done. 2. We will try Soma for baclofen for spasm 10 mg q.6 h. p.r.n. 3. I refilled her oxycodone 5 mg 1 q.6 h. p.r.n. 4. I refilled Voltaren gel apply t.i.d. to knees #3 tubes. 5. I will see her back in 2 months.  She is to call me with any     problems or questions.     Ranelle Oyster, M.D. Electronically Signed    ZTS/MedQ D:  06/15/2011 13:37:24  T:  06/15/2011 14:19:52  Job #:  [id number]

## 2011-06-19 ENCOUNTER — Other Ambulatory Visit: Payer: Self-pay | Admitting: Internal Medicine

## 2011-06-19 DIAGNOSIS — Z1231 Encounter for screening mammogram for malignant neoplasm of breast: Secondary | ICD-10-CM

## 2011-07-01 ENCOUNTER — Ambulatory Visit
Admission: RE | Admit: 2011-07-01 | Discharge: 2011-07-01 | Disposition: A | Discharge status: Home / Self Care | Payer: BC Managed Care – PPO | Source: Ambulatory Visit | Attending: Internal Medicine | Admitting: Internal Medicine

## 2011-07-01 DIAGNOSIS — Z1231 Encounter for screening mammogram for malignant neoplasm of breast: Secondary | ICD-10-CM

## 2011-08-10 ENCOUNTER — Encounter
Payer: BC Managed Care – PPO | Attending: Physical Medicine & Rehabilitation | Admitting: Physical Medicine & Rehabilitation

## 2011-08-10 DIAGNOSIS — G8220 Paraplegia, unspecified: Secondary | ICD-10-CM

## 2011-08-10 DIAGNOSIS — S343XXA Injury of cauda equina, initial encounter: Secondary | ICD-10-CM

## 2011-08-10 DIAGNOSIS — M109 Gout, unspecified: Secondary | ICD-10-CM | POA: Insufficient documentation

## 2011-08-10 DIAGNOSIS — X58XXXS Exposure to other specified factors, sequela: Secondary | ICD-10-CM | POA: Insufficient documentation

## 2011-08-10 DIAGNOSIS — K592 Neurogenic bowel, not elsewhere classified: Secondary | ICD-10-CM | POA: Insufficient documentation

## 2011-08-10 DIAGNOSIS — M1710 Unilateral primary osteoarthritis, unspecified knee: Secondary | ICD-10-CM

## 2011-08-10 DIAGNOSIS — IMO0002 Reserved for concepts with insufficient information to code with codable children: Secondary | ICD-10-CM | POA: Insufficient documentation

## 2011-08-10 DIAGNOSIS — G8929 Other chronic pain: Secondary | ICD-10-CM | POA: Insufficient documentation

## 2011-08-10 DIAGNOSIS — Y838 Other surgical procedures as the cause of abnormal reaction of the patient, or of later complication, without mention of misadventure at the time of the procedure: Secondary | ICD-10-CM | POA: Insufficient documentation

## 2011-08-10 DIAGNOSIS — N319 Neuromuscular dysfunction of bladder, unspecified: Secondary | ICD-10-CM | POA: Insufficient documentation

## 2011-08-10 DIAGNOSIS — G9589 Other specified diseases of spinal cord: Secondary | ICD-10-CM | POA: Insufficient documentation

## 2011-08-10 DIAGNOSIS — T8131XA Disruption of external operation (surgical) wound, not elsewhere classified, initial encounter: Secondary | ICD-10-CM | POA: Insufficient documentation

## 2011-08-10 NOTE — Assessment & Plan Note (Signed)
Alexandra Wiley is back regarding her spinal cord injury and chronic pain.  She had her right first toenail removed recently and this is healing.  She is actually doing well overall with spasticity using the Soma and baclofen.  Oxycodone works for breakthrough pain and she has really been using it until this recent toenail extraction.  The pain is about 6- 8/10.  She does note some improvement in the sensation of her proximal to middle right leg.  Bowel and bladder is unchanged.  REVIEW OF SYSTEMS:  Notable for the above.  Full 12-point review is in the written health and history section of the chart.  SOCIAL HISTORY:  Unchanged.  She is married and family is supportive.  PHYSICAL EXAMINATION:  VITAL SIGNS:  Blood pressure is 83/54, pulse is 70, respiratory rate 18, she is satting 94% on room air. GENERAL:  The patient is pleasant, alert, and oriented x3.  Affect is generally bright and appropriate. MUSCULOSKELETAL:  Reflexes are generally 1+ in upper extremities, trace in the lower extremities today.  Exam of the right toe which is healing with a mild scabbing noted.  No drainage was seen. NEUROLOGIC:  Cognitively, patient is alert and appropriate. HEART:  Regular rate and rhythm.  ASSESSMENT: 1. History of conus medullaris syndrome with paraplegia. 2. Neurogenic bowel and bladder. 3. Osteoarthritis, left knee. 4. History of gout. 5. Breakdown in the right first toe status post toenail extraction.  PLAN: 1. Soma and baclofen, we will continue for spasms. 2. I did refill her oxycodone 5 mg 1 q.6 hours p.r.n. #60. 3. Voltaren gel t.i.d. to knees. 4. I will follow with the patient in about 3-4 months' time.  She will     call me with any problems or questions.  She is doing really quite     well.     Ranelle Oyster, M.D. Electronically Signed    ZTS/MedQ D:  08/10/2011 13:52:29  T:  08/10/2011 20:19:12  Job #:  [id number]

## 2011-11-18 ENCOUNTER — Encounter
Payer: BC Managed Care – PPO | Attending: Physical Medicine & Rehabilitation | Admitting: Physical Medicine & Rehabilitation

## 2011-11-18 ENCOUNTER — Encounter: Payer: Self-pay | Admitting: Physical Medicine & Rehabilitation

## 2011-11-18 DIAGNOSIS — M25569 Pain in unspecified knee: Secondary | ICD-10-CM | POA: Insufficient documentation

## 2011-11-18 DIAGNOSIS — M109 Gout, unspecified: Secondary | ICD-10-CM | POA: Insufficient documentation

## 2011-11-18 DIAGNOSIS — G9589 Other specified diseases of spinal cord: Secondary | ICD-10-CM | POA: Insufficient documentation

## 2011-11-18 DIAGNOSIS — M1710 Unilateral primary osteoarthritis, unspecified knee: Secondary | ICD-10-CM | POA: Insufficient documentation

## 2011-11-18 DIAGNOSIS — K592 Neurogenic bowel, not elsewhere classified: Secondary | ICD-10-CM | POA: Insufficient documentation

## 2011-11-18 DIAGNOSIS — G959 Disease of spinal cord, unspecified: Secondary | ICD-10-CM

## 2011-11-18 DIAGNOSIS — G8220 Paraplegia, unspecified: Secondary | ICD-10-CM | POA: Insufficient documentation

## 2011-11-18 DIAGNOSIS — N319 Neuromuscular dysfunction of bladder, unspecified: Secondary | ICD-10-CM | POA: Insufficient documentation

## 2011-11-18 DIAGNOSIS — G9581 Conus medullaris syndrome: Secondary | ICD-10-CM | POA: Insufficient documentation

## 2011-11-18 MED ORDER — OXYCODONE HCL 5 MG PO CAPS: 5.0000 mg | ORAL_CAPSULE | Freq: Four times a day (QID) | ORAL | Status: DC | PRN

## 2011-11-18 MED ORDER — COLCHICINE 0.6 MG PO TABS: 0.6000 mg | ORAL_TABLET | Freq: Two times a day (BID) | ORAL | Status: DC | PRN

## 2011-11-18 NOTE — Patient Instructions (Signed)
Gout Gout is an inflammatory condition (arthritis) caused by a buildup of uric acid crystals in the joints. Uric acid is a chemical that is normally present in the blood. Under some circumstances, uric acid can form into crystals in your joints. This causes joint redness, soreness, and swelling (inflammation). Repeat attacks are common. Over time, uric acid crystals can form into masses (tophi) near a joint, causing disfigurement. Gout is treatable and often preventable. CAUSES  The disease begins with elevated levels of uric acid in the blood. Uric acid is produced by your body when it breaks down a naturally found substance called purines. This also happens when you eat certain foods such as meats and fish. Causes of an elevated uric acid level include:  Being passed down from parent to child (heredity).   Diseases that cause increased uric acid production (obesity, psoriasis, some cancers).   Excessive alcohol use.   Diet, especially diets rich in meat and seafood.   Medicines, including certain cancer-fighting drugs (chemotherapy), diuretics, and aspirin.   Chronic kidney disease. The kidneys are no longer able to remove uric acid well.   Problems with metabolism.  Conditions strongly associated with gout include:  Obesity.   High blood pressure.   High cholesterol.   Diabetes.  Not everyone with elevated uric acid levels gets gout. It is not understood why some people get gout and others do not. Surgery, joint injury, and eating too much of certain foods are some of the factors that can lead to gout. SYMPTOMS   An attack of gout comes on quickly. It causes intense pain with redness, swelling, and warmth in a joint.   Fever can occur.   Often, only one joint is involved. Certain joints are more commonly involved:   Base of the big toe.   Knee.   Ankle.   Wrist.   Finger.  Without treatment, an attack usually goes away in a few days to weeks. Between attacks, you  usually will not have symptoms, which is different from many other forms of arthritis. DIAGNOSIS  Your caregiver will suspect gout based on your symptoms and exam. Removal of fluid from the joint (arthrocentesis) is done to check for uric acid crystals. Your caregiver will give you a medicine that numbs the area (local anesthetic) and use a needle to remove joint fluid for exam. Gout is confirmed when uric acid crystals are seen in joint fluid, using a special microscope. Sometimes, blood, urine, and X-ray tests are also used. TREATMENT  There are 2 phases to gout treatment: treating the sudden onset (acute) attack and preventing attacks (prophylaxis). Treatment of an Acute Attack  Medicines are used. These include anti-inflammatory medicines or steroid medicines.   An injection of steroid medicine into the affected joint is sometimes necessary.   The painful joint is rested. Movement can worsen the arthritis.   You may use warm or cold treatments on painful joints, depending which works best for you.   Discuss the use of coffee, vitamin C, or cherries with your caregiver. These may be helpful treatment options.  Treatment to Prevent Attacks After the acute attack subsides, your caregiver may advise prophylactic medicine. These medicines either help your kidneys eliminate uric acid from your body or decrease your uric acid production. You may need to stay on these medicines for a very long time. The early phase of treatment with prophylactic medicine can be associated with an increase in acute gout attacks. For this reason, during the first few months   of treatment, your caregiver may also advise you to take medicines usually used for acute gout treatment. Be sure you understand your caregiver's directions. You should also discuss dietary treatment with your caregiver. Certain foods such as meats and fish can increase uric acid levels. Other foods such as dairy can decrease levels. Your caregiver  can give you a list of foods to avoid. HOME CARE INSTRUCTIONS   Do not take aspirin to relieve pain. This raises uric acid levels.   Only take over-the-counter or prescription medicines for pain, discomfort, or fever as directed by your caregiver.   Rest the joint as much as possible. When in bed, keep sheets and blankets off painful areas.   Keep the affected joint raised (elevated).   Use crutches if the painful joint is in your leg.   Drink enough water and fluids to keep your urine clear or pale yellow. This helps your body get rid of uric acid. Do not drink alcoholic beverages. They slow the passage of uric acid.   Follow your caregiver's dietary instructions. Pay careful attention to the amount of protein you eat. Your daily diet should emphasize fruits, vegetables, whole grains, and fat-free or low-fat milk products.   Maintain a healthy body weight.  SEEK MEDICAL CARE IF:   You have an oral temperature above 102 F (38.9 C).   You develop diarrhea, vomiting, or any side effects from medicines.   You do not feel better in 24 hours, or you are getting worse.  SEEK IMMEDIATE MEDICAL CARE IF:   Your joint becomes suddenly more tender and you have:   Chills.   An oral temperature above 102 F (38.9 C), not controlled by medicine.  MAKE SURE YOU:   Understand these instructions.   Will watch your condition.   Will get help right away if you are not doing well or get worse.  Document Released: 09/04/2000 Document Revised: 05/20/2011 Document Reviewed: 12/16/2009 [employer] Patient Information 2012 [employer]. 

## 2011-11-18 NOTE — Progress Notes (Addendum)
Subjective:    Patient ID: Alexandra Wiley, female    DOB: 1946-01-24, 66 y.o.   MRN: [id number]  HPI The patient is here today for a mobility examination for a power wheelchair but also complains of knee pain. The patient is chronically limited in her movements due to her spinal cord infarction resulting paraplegia. She is limited in her abilities to perform her basic self-care including bathing dressing toileting. She can walk around on a very limited basis using her ADLs. She primarily uses a standard wheelchair to move around the house. Using her walker or using a manual  wheelchair tends to put a lot of additional stress on her shoulders and elbows which has increased pain in these areas also. She is limited in using a manual chair it will only short distances within the home.  She has gouty arthritis as well which this also can exacerbate. Her balance is quite limited. She is limited with her trunk control and also limited in sitting due to pain which would limit the utility of a scooter in her situation.    the patient is here also regarding her knee pain as noted above. She complains of1 week  of left knee pain  which has been increasing despite position.  Feels like it goes up to her hip. Still using voltaren gel and ran out of her oxycodone yesterday. Feels like it's warm to touch and swollen.  Neurologically unchanged. Has been working with HHPT to increase walking and standing since her 1st toenail removal in November.    No changes in bowel or bladder.   Pain Inventory Average Pain 5 Pain Right Now 9 My pain is constant, sharp, burning, stabbing and tingling  In the last 24 hours, has pain interfered with the following? General activity 8 Relation with others 8 Enjoyment of life 8 What TIME of day is your pain at its worst? Morning and Night Sleep (in general) Fair  Pain is worse with: walking, bending, sitting, inactivity and standing Pain improves with: medication Relief  from Meds: 5  Mobility ability to climb steps?  no do you drive?  no use a wheelchair  Function retired  Neuro/Psych numbness tremor tingling trouble walking spasms dizziness  Prior Studies x-rays  Physicians involved in your care Any changes since last visit?  no      Review of Systems  Constitutional: Negative.   HENT: Negative.   Eyes: Negative.   Respiratory: Negative.   Cardiovascular: Negative.   Gastrointestinal: Negative.   Genitourinary: Negative.   Musculoskeletal: Negative.   Skin: Negative.   Neurological: Negative.   Hematological: Negative.   Psychiatric/Behavioral: Negative.        Objective:   Physical Exam  Constitutional: She is oriented to person, place, and time. She appears well-developed.  HENT:  Head: Normocephalic.  Eyes: Pupils are equal, round, and reactive to light.  Cardiovascular: Normal rate.   Pulmonary/Chest: Effort normal.  Abdominal: Soft.  Genitourinary: Vagina normal.  Musculoskeletal:       Left knee with slight peripatellar swelling and warmth. Knee quite tender to touch. Difficulty extending knee due to pain.   Neurological: She is alert and oriented to person, place, and time.       Pt with 3/5 strength at HF and KE bilaterally.  ADF/APF is trace to absent.  Sensory 1/2 in both legs from thighs distally. DTR's 1+. Cognitively intact  Skin: Skin is warm.          Assessment & Plan:  1. Conus medullaris syndrome with paraplegia 2. Neurogenic bowel and bladder 3. Osteoarthritis left knee 4. History of gout with acute flare affecting left knee today.  Plan: 1. After informed consent I injected the left knee via lateral approach with 40 mg Kenalog and 3 cc 1% lidocaine. Patient tolerated well. Postinjection instructions were given. 2. Prescription for colchicine 0.6 mg twice a day when necessary 3. Oxycodone IR 5 mg one every 12 hours when necessary #60 was also written today 4. Continue home health  therapies to tolerance.  5. Powered wheelchair evaluation and documentation was performed today. See above for current limitations regarding a scooter her manual wheelchair/walker. In my estimation the patient does have the physical and mental abilities to safely operate a power wheelchair at home. She is quite motivated and will use a wheelchair to move around within the community and more easily around her home. 6. Followup with me in 3 months time. Patient was encouraged to call with any problems or questions. All questions were answered today.

## 2011-12-08 ENCOUNTER — Ambulatory Visit: Payer: BC Managed Care – PPO | Admitting: Physical Medicine & Rehabilitation

## 2011-12-22 ENCOUNTER — Encounter: Payer: Self-pay | Admitting: Physical Medicine & Rehabilitation

## 2011-12-23 ENCOUNTER — Encounter: Payer: Self-pay | Admitting: Physical Medicine & Rehabilitation

## 2012-01-17 ENCOUNTER — Other Ambulatory Visit: Payer: Self-pay | Admitting: Physical Medicine & Rehabilitation

## 2012-01-20 HISTORY — PX: OTHER SURGICAL HISTORY: SHX169

## 2012-02-02 ENCOUNTER — Emergency Department (HOSPITAL_COMMUNITY): Payer: Medicare Other

## 2012-02-02 ENCOUNTER — Ambulatory Visit (HOSPITAL_COMMUNITY)
Admission: RE | Admit: 2012-02-02 | Discharge: 2012-02-02 | Disposition: A | Discharge status: Home / Self Care | Payer: Medicare Other | Source: Ambulatory Visit | Attending: Internal Medicine | Admitting: Internal Medicine

## 2012-02-02 ENCOUNTER — Inpatient Hospital Stay (HOSPITAL_COMMUNITY)
Admission: EM | Admit: 2012-02-02 | Discharge: 2012-02-23 | DRG: 616 | Disposition: A | Discharge status: Home / Self Care | Payer: Medicare Other | Attending: Family Medicine | Admitting: Family Medicine

## 2012-02-02 ENCOUNTER — Encounter (HOSPITAL_COMMUNITY): Payer: Self-pay | Admitting: *Deleted

## 2012-02-02 ENCOUNTER — Other Ambulatory Visit: Payer: Self-pay | Admitting: Internal Medicine

## 2012-02-02 DIAGNOSIS — L97509 Non-pressure chronic ulcer of other part of unspecified foot with unspecified severity: Secondary | ICD-10-CM | POA: Diagnosis present

## 2012-02-02 DIAGNOSIS — L03119 Cellulitis of unspecified part of limb: Secondary | ICD-10-CM | POA: Diagnosis present

## 2012-02-02 DIAGNOSIS — E669 Obesity, unspecified: Secondary | ICD-10-CM | POA: Diagnosis present

## 2012-02-02 DIAGNOSIS — E119 Type 2 diabetes mellitus without complications: Secondary | ICD-10-CM | POA: Insufficient documentation

## 2012-02-02 DIAGNOSIS — M109 Gout, unspecified: Secondary | ICD-10-CM | POA: Diagnosis present

## 2012-02-02 DIAGNOSIS — M869 Osteomyelitis, unspecified: Secondary | ICD-10-CM | POA: Insufficient documentation

## 2012-02-02 DIAGNOSIS — N319 Neuromuscular dysfunction of bladder, unspecified: Secondary | ICD-10-CM

## 2012-02-02 DIAGNOSIS — E039 Hypothyroidism, unspecified: Secondary | ICD-10-CM | POA: Diagnosis present

## 2012-02-02 DIAGNOSIS — M9080 Osteopathy in diseases classified elsewhere, unspecified site: Secondary | ICD-10-CM | POA: Diagnosis present

## 2012-02-02 DIAGNOSIS — L02619 Cutaneous abscess of unspecified foot: Secondary | ICD-10-CM | POA: Diagnosis present

## 2012-02-02 DIAGNOSIS — I10 Essential (primary) hypertension: Secondary | ICD-10-CM | POA: Diagnosis present

## 2012-02-02 DIAGNOSIS — M729 Fibroblastic disorder, unspecified: Secondary | ICD-10-CM | POA: Diagnosis present

## 2012-02-02 DIAGNOSIS — J9600 Acute respiratory failure, unspecified whether with hypoxia or hypercapnia: Secondary | ICD-10-CM

## 2012-02-02 DIAGNOSIS — G8220 Paraplegia, unspecified: Secondary | ICD-10-CM | POA: Diagnosis present

## 2012-02-02 DIAGNOSIS — L0390 Cellulitis, unspecified: Secondary | ICD-10-CM

## 2012-02-02 DIAGNOSIS — E1169 Type 2 diabetes mellitus with other specified complication: Principal | ICD-10-CM | POA: Diagnosis present

## 2012-02-02 DIAGNOSIS — I5032 Chronic diastolic (congestive) heart failure: Secondary | ICD-10-CM | POA: Diagnosis present

## 2012-02-02 DIAGNOSIS — I252 Old myocardial infarction: Secondary | ICD-10-CM

## 2012-02-02 DIAGNOSIS — G9581 Conus medullaris syndrome: Secondary | ICD-10-CM

## 2012-02-02 DIAGNOSIS — E870 Hyperosmolality and hypernatremia: Secondary | ICD-10-CM | POA: Diagnosis present

## 2012-02-02 DIAGNOSIS — R34 Anuria and oliguria: Secondary | ICD-10-CM

## 2012-02-02 DIAGNOSIS — A419 Sepsis, unspecified organism: Secondary | ICD-10-CM | POA: Diagnosis not present

## 2012-02-02 DIAGNOSIS — F17200 Nicotine dependence, unspecified, uncomplicated: Secondary | ICD-10-CM | POA: Diagnosis present

## 2012-02-02 DIAGNOSIS — R4182 Altered mental status, unspecified: Secondary | ICD-10-CM

## 2012-02-02 DIAGNOSIS — G9589 Other specified diseases of spinal cord: Secondary | ICD-10-CM | POA: Diagnosis present

## 2012-02-02 DIAGNOSIS — E871 Hypo-osmolality and hyponatremia: Secondary | ICD-10-CM | POA: Diagnosis not present

## 2012-02-02 DIAGNOSIS — R6521 Severe sepsis with septic shock: Secondary | ICD-10-CM

## 2012-02-02 DIAGNOSIS — K592 Neurogenic bowel, not elsewhere classified: Secondary | ICD-10-CM

## 2012-02-02 DIAGNOSIS — D6859 Other primary thrombophilia: Secondary | ICD-10-CM | POA: Diagnosis present

## 2012-02-02 DIAGNOSIS — G40909 Epilepsy, unspecified, not intractable, without status epilepticus: Secondary | ICD-10-CM | POA: Diagnosis present

## 2012-02-02 DIAGNOSIS — T465X5A Adverse effect of other antihypertensive drugs, initial encounter: Secondary | ICD-10-CM | POA: Diagnosis present

## 2012-02-02 DIAGNOSIS — Z794 Long term (current) use of insulin: Secondary | ICD-10-CM

## 2012-02-02 DIAGNOSIS — I96 Gangrene, not elsewhere classified: Secondary | ICD-10-CM | POA: Diagnosis present

## 2012-02-02 DIAGNOSIS — L97409 Non-pressure chronic ulcer of unspecified heel and midfoot with unspecified severity: Secondary | ICD-10-CM | POA: Diagnosis present

## 2012-02-02 DIAGNOSIS — D649 Anemia, unspecified: Secondary | ICD-10-CM | POA: Diagnosis not present

## 2012-02-02 DIAGNOSIS — D72829 Elevated white blood cell count, unspecified: Secondary | ICD-10-CM | POA: Diagnosis present

## 2012-02-02 DIAGNOSIS — I739 Peripheral vascular disease, unspecified: Secondary | ICD-10-CM | POA: Diagnosis present

## 2012-02-02 DIAGNOSIS — N170 Acute kidney failure with tubular necrosis: Secondary | ICD-10-CM | POA: Diagnosis not present

## 2012-02-02 DIAGNOSIS — J95821 Acute postprocedural respiratory failure: Secondary | ICD-10-CM | POA: Diagnosis not present

## 2012-02-02 DIAGNOSIS — R569 Unspecified convulsions: Secondary | ICD-10-CM

## 2012-02-02 DIAGNOSIS — R6520 Severe sepsis without septic shock: Secondary | ICD-10-CM | POA: Diagnosis not present

## 2012-02-02 DIAGNOSIS — Z8673 Personal history of transient ischemic attack (TIA), and cerebral infarction without residual deficits: Secondary | ICD-10-CM

## 2012-02-02 DIAGNOSIS — M129 Arthropathy, unspecified: Secondary | ICD-10-CM | POA: Diagnosis present

## 2012-02-02 DIAGNOSIS — I498 Other specified cardiac arrhythmias: Secondary | ICD-10-CM | POA: Diagnosis not present

## 2012-02-02 DIAGNOSIS — E872 Acidosis, unspecified: Secondary | ICD-10-CM | POA: Diagnosis not present

## 2012-02-02 DIAGNOSIS — L02419 Cutaneous abscess of limb, unspecified: Secondary | ICD-10-CM | POA: Diagnosis present

## 2012-02-02 DIAGNOSIS — M1710 Unilateral primary osteoarthritis, unspecified knee: Secondary | ICD-10-CM

## 2012-02-02 DIAGNOSIS — I9589 Other hypotension: Secondary | ICD-10-CM | POA: Diagnosis present

## 2012-02-02 DIAGNOSIS — G9341 Metabolic encephalopathy: Secondary | ICD-10-CM | POA: Diagnosis not present

## 2012-02-02 DIAGNOSIS — I509 Heart failure, unspecified: Secondary | ICD-10-CM | POA: Diagnosis present

## 2012-02-02 HISTORY — DX: Conus medullaris syndrome: G95.81

## 2012-02-02 HISTORY — DX: Type 2 diabetes mellitus without complications: E11.9

## 2012-02-02 HISTORY — DX: Hypothyroidism, unspecified: E03.9

## 2012-02-02 HISTORY — DX: Malignant neoplasm of unspecified site of unspecified female breast: C50.919

## 2012-02-02 HISTORY — DX: Dyspnea, unspecified: R06.00

## 2012-02-02 HISTORY — DX: Acute infarction of spinal cord (embolic) (nonembolic): G95.11

## 2012-02-02 HISTORY — DX: Carpal tunnel syndrome, right upper limb: G56.01

## 2012-02-02 HISTORY — DX: Unspecified convulsions: R56.9

## 2012-02-02 HISTORY — DX: Other forms of dyspnea: R06.09

## 2012-02-02 HISTORY — DX: Pure hypercholesterolemia, unspecified: E78.00

## 2012-02-02 HISTORY — DX: Pneumonia, unspecified organism: J18.9

## 2012-02-02 HISTORY — DX: Angina pectoris, unspecified: I20.9

## 2012-02-02 LAB — BASIC METABOLIC PANEL
CO2: 25 mEq/L (ref 19–32)
Calcium: 10.1 mg/dL (ref 8.4–10.5)
Creatinine, Ser: 0.81 mg/dL (ref 0.50–1.10)
Glucose, Bld: 140 mg/dL — ABNORMAL HIGH (ref 70–99)
Sodium: 135 mEq/L (ref 135–145)

## 2012-02-02 LAB — CBC
HCT: 37.4 % (ref 36.0–46.0)
MCV: 81.1 fL (ref 78.0–100.0)
RBC: 4.61 MIL/uL (ref 3.87–5.11)
WBC: 17.7 10*3/uL — ABNORMAL HIGH (ref 4.0–10.5)

## 2012-02-02 LAB — DIFFERENTIAL
Eosinophils Relative: 1 % (ref 0–5)
Lymphocytes Relative: 16 % (ref 12–46)
Lymphs Abs: 2.9 10*3/uL (ref 0.7–4.0)
Monocytes Absolute: 1.1 10*3/uL — ABNORMAL HIGH (ref 0.1–1.0)

## 2012-02-02 MED ORDER — DICLOFENAC SODIUM 1 % TD GEL
1.0000 "application " | Freq: Three times a day (TID) | TRANSDERMAL | Status: DC | PRN
  Filled 2012-02-02: qty 100

## 2012-02-02 MED ORDER — OXYCODONE HCL 5 MG PO TABS
5.0000 mg | ORAL_TABLET | Freq: Four times a day (QID) | ORAL | Status: DC | PRN
  Administered 2012-02-02 – 2012-02-05 (×3): 5 mg via ORAL
  Filled 2012-02-02 (×3): qty 1

## 2012-02-02 MED ORDER — ATORVASTATIN CALCIUM 10 MG PO TABS
10.0000 mg | ORAL_TABLET | Freq: Every day | ORAL | Status: DC
  Administered 2012-02-02 – 2012-02-23 (×21): 10 mg via ORAL
  Filled 2012-02-02 (×22): qty 1

## 2012-02-02 MED ORDER — BACLOFEN 10 MG PO TABS
10.0000 mg | ORAL_TABLET | Freq: Two times a day (BID) | ORAL | Status: DC
  Administered 2012-02-02 – 2012-02-19 (×32): 10 mg via ORAL
  Filled 2012-02-02 (×36): qty 1

## 2012-02-02 MED ORDER — VANCOMYCIN HCL 1000 MG IV SOLR
1250.0000 mg | Freq: Two times a day (BID) | INTRAVENOUS | Status: DC
  Administered 2012-02-02 – 2012-02-03 (×2): 1250 mg via INTRAVENOUS
  Filled 2012-02-02 (×3): qty 1250

## 2012-02-02 MED ORDER — PIPERACILLIN-TAZOBACTAM 3.375 G IVPB
3.3750 g | Freq: Three times a day (TID) | INTRAVENOUS | Status: DC
  Administered 2012-02-03 – 2012-02-07 (×13): 3.375 g via INTRAVENOUS
  Filled 2012-02-02 (×18): qty 50

## 2012-02-02 MED ORDER — LEVOTHYROXINE SODIUM 75 MCG PO TABS
75.0000 ug | ORAL_TABLET | Freq: Every day | ORAL | Status: DC
  Administered 2012-02-02 – 2012-02-05 (×4): 75 ug via ORAL
  Filled 2012-02-02 (×5): qty 1

## 2012-02-02 MED ORDER — AMLODIPINE BESYLATE 5 MG PO TABS
5.0000 mg | ORAL_TABLET | Freq: Every day | ORAL | Status: DC
  Filled 2012-02-02 (×2): qty 1

## 2012-02-02 MED ORDER — ALLOPURINOL 100 MG PO TABS
100.0000 mg | ORAL_TABLET | Freq: Every day | ORAL | Status: DC
  Administered 2012-02-02 – 2012-02-05 (×4): 100 mg via ORAL
  Filled 2012-02-02 (×6): qty 1

## 2012-02-02 MED ORDER — ACETAMINOPHEN 325 MG PO TABS
650.0000 mg | ORAL_TABLET | Freq: Four times a day (QID) | ORAL | Status: DC | PRN
  Administered 2012-02-03 – 2012-02-04 (×2): 650 mg via ORAL
  Filled 2012-02-02 (×2): qty 2

## 2012-02-02 MED ORDER — SODIUM CHLORIDE 0.9 % IJ SOLN
3.0000 mL | Freq: Two times a day (BID) | INTRAMUSCULAR | Status: DC
  Administered 2012-02-04: 10 mL via INTRAVENOUS
  Administered 2012-02-06: 23:00:00 via INTRAVENOUS
  Administered 2012-02-06 – 2012-02-07 (×2): 3 mL via INTRAVENOUS
  Administered 2012-02-08 (×2): 10 mL via INTRAVENOUS
  Administered 2012-02-09: 3 mL via INTRAVENOUS
  Administered 2012-02-09: 10 mL via INTRAVENOUS
  Administered 2012-02-09: 5 mL via INTRAVENOUS
  Administered 2012-02-10: 11:00:00 via INTRAVENOUS
  Administered 2012-02-10 – 2012-02-19 (×17): 3 mL via INTRAVENOUS

## 2012-02-02 MED ORDER — PIPERACILLIN SOD-TAZOBACTAM SO 2.25 (2-0.25) G IV SOLR
3.3750 g | Freq: Once | INTRAVENOUS | Status: AC
  Administered 2012-02-02: 3.375 g via INTRAVENOUS
  Filled 2012-02-02: qty 3.38

## 2012-02-02 MED ORDER — OXYBUTYNIN CHLORIDE ER 10 MG PO TB24
10.0000 mg | ORAL_TABLET | Freq: Every day | ORAL | Status: DC
  Administered 2012-02-02 – 2012-02-05 (×4): 10 mg via ORAL
  Filled 2012-02-02 (×6): qty 1

## 2012-02-02 MED ORDER — METOPROLOL-HYDROCHLOROTHIAZIDE 100-25 MG PO TABS: 0.5000 | ORAL_TABLET | Freq: Every day | ORAL | Status: DC

## 2012-02-02 MED ORDER — GADOBENATE DIMEGLUMINE 529 MG/ML IV SOLN
15.0000 mL | Freq: Once | INTRAVENOUS | Status: AC
  Administered 2012-02-02: 15 mL via INTRAVENOUS

## 2012-02-02 MED ORDER — ACETAMINOPHEN 650 MG RE SUPP: 650.0000 mg | Freq: Four times a day (QID) | RECTAL | Status: DC | PRN

## 2012-02-02 MED ORDER — GABAPENTIN 300 MG PO CAPS
300.0000 mg | ORAL_CAPSULE | Freq: Four times a day (QID) | ORAL | Status: DC
  Administered 2012-02-02 – 2012-02-06 (×12): 300 mg via ORAL
  Filled 2012-02-02 (×22): qty 1

## 2012-02-02 MED ORDER — PANTOPRAZOLE SODIUM 40 MG PO TBEC
40.0000 mg | DELAYED_RELEASE_TABLET | Freq: Every day | ORAL | Status: DC
  Administered 2012-02-02 – 2012-02-05 (×4): 40 mg via ORAL
  Filled 2012-02-02 (×6): qty 1

## 2012-02-02 MED ORDER — OXYCODONE HCL 5 MG PO CAPS: 5.0000 mg | ORAL_CAPSULE | Freq: Four times a day (QID) | ORAL | Status: DC | PRN

## 2012-02-02 MED ORDER — METOPROLOL TARTRATE 50 MG PO TABS
50.0000 mg | ORAL_TABLET | Freq: Every day | ORAL | Status: DC
  Administered 2012-02-02: 50 mg via ORAL
  Filled 2012-02-02: qty 1

## 2012-02-02 MED ORDER — HYDROCHLOROTHIAZIDE 12.5 MG PO CAPS
12.5000 mg | ORAL_CAPSULE | Freq: Every day | ORAL | Status: DC
  Filled 2012-02-02: qty 1

## 2012-02-02 MED ORDER — METHOCARBAMOL 500 MG PO TABS
500.0000 mg | ORAL_TABLET | Freq: Four times a day (QID) | ORAL | Status: DC
  Administered 2012-02-02 – 2012-02-04 (×6): 500 mg via ORAL
  Filled 2012-02-02 (×10): qty 1

## 2012-02-02 MED ORDER — NITROFURANTOIN MACROCRYSTAL 100 MG PO CAPS
100.0000 mg | ORAL_CAPSULE | Freq: Every day | ORAL | Status: DC
  Administered 2012-02-02: 100 mg via ORAL
  Filled 2012-02-02 (×2): qty 1

## 2012-02-02 MED ORDER — INSULIN ASPART 100 UNIT/ML ~~LOC~~ SOLN
0.0000 [IU] | Freq: Three times a day (TID) | SUBCUTANEOUS | Status: DC
  Administered 2012-02-03: 3 [IU] via SUBCUTANEOUS
  Administered 2012-02-04: 2 [IU] via SUBCUTANEOUS
  Administered 2012-02-04: 3 [IU] via SUBCUTANEOUS
  Administered 2012-02-06 (×2): 2 [IU] via SUBCUTANEOUS
  Administered 2012-02-07: 3 [IU] via SUBCUTANEOUS
  Administered 2012-02-07 – 2012-02-08 (×4): 2 [IU] via SUBCUTANEOUS

## 2012-02-02 MED ORDER — INSULIN ASPART PROT & ASPART (70-30 MIX) 100 UNIT/ML ~~LOC~~ SUSP
12.0000 [IU] | Freq: Every day | SUBCUTANEOUS | Status: DC
  Administered 2012-02-04 – 2012-02-09 (×5): 12 [IU] via SUBCUTANEOUS
  Filled 2012-02-02 (×2): qty 3

## 2012-02-02 MED ORDER — HEPARIN SODIUM (PORCINE) 5000 UNIT/ML IJ SOLN
5000.0000 [IU] | Freq: Three times a day (TID) | INTRAMUSCULAR | Status: DC
  Administered 2012-02-02 – 2012-02-18 (×45): 5000 [IU] via SUBCUTANEOUS
  Filled 2012-02-02 (×50): qty 1

## 2012-02-02 NOTE — Progress Notes (Addendum)
ANTIBIOTIC CONSULT NOTE - INITIAL  Pharmacy Consult for Vancomycin Indication: Right foot cellulitis vs. osteomyelitis  No Known Allergies  Patient Measurements: Height: 4' 11.06" (150 cm) Weight: 179 lb 14.3 oz (81.6 kg) IBW/kg (Calculated) : 43.33   Vital Signs: Temp: 97.9 F (36.6 C) (05/14 1357) Temp src: Oral (05/14 1357) BP: 102/66 mmHg (05/14 1357) Pulse Rate: 80  (05/14 1357) Intake/Output from previous day:   Intake/Output from this shift:    Labs:  Basename 02/02/12 1539  WBC 17.7*  HGB 12.9  PLT 440*  LABCREA --  CREATININE --   Estimated Creatinine Clearance: 53 ml/min (by C-G formula based on Cr of 0.94). No results found for this basename: VANCOTROUGH:2,VANCOPEAK:2,VANCORANDOM:2,GENTTROUGH:2,GENTPEAK:2,GENTRANDOM:2,TOBRATROUGH:2,TOBRAPEAK:2,TOBRARND:2,AMIKACINPEAK:2,AMIKACINTROU:2,AMIKACIN:2, in the last 72 hours   Microbiology: No results found for this or any previous visit (from the past 720 hour(s)).  Medical History: Past Medical History  Diagnosis Date  . Stroke   . Ulcer   . Diabetes mellitus   . Arthritis   . Myocardial infarction   . Hypertension   . Heart murmur   . Gout   . PVD (peripheral vascular disease)   . Pain in limb   . CHF (congestive heart failure)     Medications:   (Not in a hospital admission) Assessment: 66 y/o female patient admitted with right foot infection requiring broad spectrum antibiotics for r/o osteomyelitis. BMET pending.  Goal of Therapy:  Vancomycin trough level 15-20 mcg/ml  Plan:  Vancomycin 1250mg  IV q12h, and monitor renal function. Measure antibiotic drug levels at steady [address], PharmD, [place] Pager [phone] 02/02/2012,4:10 PM   Addedum:  New orders to start zosyn, zosyn 3.375gm IV q8h with 4h infusion  Juliette Alcide, PharmD, BCPS.  Pager: [phone] 02/02/2012 10:31 PM

## 2012-02-02 NOTE — ED Provider Notes (Signed)
History     CSN: [id number]  Arrival date & time 02/02/12  1347   First MD Initiated Contact with Patient 02/02/12 1511      Chief Complaint  Patient presents with  . Wound Infection    right foot    (Consider location/radiation/quality/duration/timing/severity/associated sxs/prior treatment) HPI  66yoF h/o CVA, largely wheelchair bound, insulin-dependent diabetes presents with wound to the right foot. The patient states that she has notice progressively enlarging wound for approximately 3 weeks to the heel of her right foot. She states that at baseline she cannot feel much pain in her bilateral lower extremities. She states that the wound itself is growing larger. Over the past few days she's noticed an odor and today she noticed redness of her right ankle. She complains of subjective fevers and chills. He was seen by her primary care physician today and had an x-ray which was concerning for osteomyelitis. The patient was referred to the emergency department for further workup and evaluation.  ED Notes, ED Provider Notes from 02/02/12 0000 to 02/02/12 14:00:32       Sabrina Tommy Medal, RN 02/02/2012 13:57      Pt is here with right foot infection and sent to ED by dr. Margaretmary Bayley. Pt fell 2 weeks ago and xray was done today that question osteomyelitis. Pt has information and has information for Dr. Sherlean Foot    Past Medical History  Diagnosis Date  . Stroke   . Ulcer   . Diabetes mellitus   . Arthritis   . Myocardial infarction   . Hypertension   . Heart murmur   . Gout   . PVD (peripheral vascular disease)   . Pain in limb   . CHF (congestive heart failure)     Past Surgical History  Procedure Date  . Leg surgery     Left leg, repair of non-union femur fracture by Dr. Orland Jarred  . Foot tendon surgery 2000    Left foot  . Breast surgery     Right breast carcinoma in situ  . Tubal ligation     Family History  Problem Relation Age of Onset  . Arthritis Mother   .  Hypertension Mother   . Heart disease Father   . Diabetes Father     History  Substance Use Topics  . Smoking status: Current Everyday Smoker -- 0.2 packs/day  . Smokeless tobacco: Not on file  . Alcohol Use: No    OB History    Grav Para Term Preterm Abortions TAB SAB Ect Mult Living                  Review of Systems  All other systems reviewed and are negative.  except as noted HPI   Allergies  Review of patient's allergies indicates no known allergies.  Home Medications   Current Outpatient Rx  Name Route Sig Dispense Refill  . ALLOPURINOL 100 MG PO TABS Oral Take 100 mg by mouth daily.      Marland Kitchen AMLODIPINE BESYLATE 5 MG PO TABS Oral Take 5 mg by mouth daily.      . ASPIRIN EC 81 MG PO TBEC Oral Take 81 mg by mouth daily.    . ATORVASTATIN CALCIUM 10 MG PO TABS Oral Take 10 mg by mouth daily.    Marland Kitchen BACLOFEN 10 MG PO TABS Oral Take 10 mg by mouth 2 (two) times daily.     . COLCHICINE 0.6 MG PO TABS Oral Take 0.6 mg by mouth  every 12 (twelve) hours as needed. For gout.    Marland Kitchen DICLOFENAC SODIUM 1 % TD GEL Topical Apply 1 application topically 3 (three) times daily as needed. For pain    . GABAPENTIN 300 MG PO CAPS Oral Take 300 mg by mouth 4 (four) times daily.      . INSULIN ASPART PROT & ASPART (70-30) 100 UNIT/ML Kirby SUSP Subcutaneous Inject 12 Units into the skin as directed. Sliding scale. 12 units if blood sugar is 150, 16 units if sugar is 200 and above    . LEVOTHYROXINE SODIUM 75 MCG PO TABS Oral Take 75 mcg by mouth daily.      Marland Kitchen METHOCARBAMOL 500 MG PO TABS Oral Take 500 mg by mouth 4 (four) times daily.      Marland Kitchen METOPROLOL-HYDROCHLOROTHIAZIDE 100-25 MG PO TABS Oral Take 0.5 tablets by mouth daily. 1/2 tablet daily    . NITROFURANTOIN MACROCRYSTAL 100 MG PO CAPS Oral Take 100 mg by mouth daily.      Marland Kitchen NORTRIPTYLINE HCL 25 MG PO CAPS  TAKE ONE CAPSULE BY MOUTH AT BEDTIME 30 capsule 1  . OMEPRAZOLE 20 MG PO CPDR Oral Take 20 mg by mouth daily.      . OXYBUTYNIN CHLORIDE  ER 10 MG PO TB24 Oral Take 10 mg by mouth daily.      . OXYCODONE HCL 5 MG PO CAPS Oral Take 5 mg by mouth every 6 (six) hours as needed. For pain      BP 114/55  Pulse 83  Temp(Src) 97.9 F (36.6 C) (Oral)  Resp 15  Ht 4' 11.06" (1.5 m)  Wt 179 lb 14.3 oz (81.6 kg)  BMI 36.27 kg/m2  SpO2 95%  Physical Exam  Nursing note and vitals reviewed. Constitutional: She is oriented to person, place, and time. She appears well-developed.  HENT:  Head: Atraumatic.  Mouth/Throat: Oropharynx is clear and moist.  Eyes: Conjunctivae and EOM are normal. Pupils are equal, round, and reactive to light.  Neck: Normal range of motion. Neck supple.  Cardiovascular: Normal rate, regular rhythm, normal heart sounds and intact distal pulses.   Pulmonary/Chest: Effort normal and breath sounds normal. No respiratory distress. She has no wheezes. She has no rales.  Abdominal: Soft. She exhibits no distension. There is no tenderness. There is no rebound and no guarding.  Musculoskeletal: Normal range of motion.  Neurological: She is alert and oriented to person, place, and time.  Skin: Skin is warm and dry. No rash noted.       R heel with large ulcer no ttp. Central necrosis. Foul odor. +surrounding erythema extending to distal lower leg. No crepitus. Medial distal foot with small ulcer. Capillary refill < 3 sec.   Lt heel with small ulcer  Psychiatric: She has a normal mood and affect.    ED Course  Procedures (including critical care time)  Labs Reviewed  CBC - Abnormal; Notable for the following:    WBC 17.7 (*)    Platelets 440 (*)    All other components within normal limits  DIFFERENTIAL - Abnormal; Notable for the following:    Neutro Abs 13.5 (*)    Monocytes Absolute 1.1 (*)    All other components within normal limits  BASIC METABOLIC PANEL - Abnormal; Notable for the following:    Glucose, Bld 140 (*)    GFR calc non Af Amer 73 (*)    GFR calc Af Amer 85 (*)    All other components  within normal  limits  SEDIMENTATION RATE - Abnormal; Notable for the following:    Sed Rate 63 (*)    All other components within normal limits  C-REACTIVE PROTEIN  BASIC METABOLIC PANEL  CBC   Dg Foot Complete Right  02/02/2012  *RADIOLOGY REPORT*  Clinical Data: Wound at heel question osteomyelitis, history diabetes, fell 2 weeks ago  RIGHT FOOT COMPLETE - 3+ VIEW  Comparison: None.  Findings: Diffuse soft tissue swelling. Osseous demineralization. Joint spaces preserved. Soft tissue irregularity and dressing artifacts dorsal to the calcaneus and Achilles insertion. Suspicious area of potential bone destruction is seen at the posterior margin of the calcaneus suspicious for osteomyelitis. Small plantar calcaneal spur. No additional fracture, dislocation or bone destruction seen.  IMPRESSION: Significant soft tissue swelling diffusely in right foot with osseous demineralization. Suspected area of bone destruction at the posterior margin of the calcaneus deep to the known wound suspicious for osteomyelitis. If further imaging is required recommend MR imaging with and without contrast.  Original Report Authenticated By: Lollie Marrow, M.D.    1. Cellulitis   2. Osteomyelitis   3. Diabetic foot ulcer     MDM  H/o DM presents with concern for osteomyelitis/cellulitis RLE. Leukocytosis, elevated ESR noted. Vancomycin and zosyn ordered. MRI wwo contrast ordered. Admitted to unassigned [facility]).        Forbes Cellar, MD 02/02/12 [phone]

## 2012-02-02 NOTE — ED Notes (Signed)
Attempted PIV x 2 without success; IV team paged to assist

## 2012-02-02 NOTE — ED Notes (Signed)
Pt is here with right foot infection and sent to ED by dr. Margaretmary Bayley.  Pt fell 2 weeks ago and xray was done today that question osteomyelitis.  Pt has information and has information for Dr. Sherlean Foot

## 2012-02-02 NOTE — H&P (Signed)
Alexandra Wiley is an 66 y.o. female.   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 She noticed a small lesion on her heel 3 weeks ago which progressively became larger a couple of days ago. She noticed some drainage from both ulcers. The ulcer became foul smelling yesterday and she noticed redness along her ankle today. She was seen by her PCP where an xray showed concern for osteomyelitis, at which point she came to the ED.  She has loss of sensation in her lower extremities at baseline from her stroke and therefore does not feel much pain in her right foot. She reports chills but did not check her temperature.   In the ED, she was started on vanc and zosyn and MRI was obtained to evaluate for osteomyelitis.   Past Medical History  Diagnosis Date  . 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 Ulcer   . Diabetes mellitus: On insulin NPH 70/30 12 u bid   . Arthritis   . Myocardial infarction   . Hypertension   . Heart murmur   . Gout   . PVD (peripheral vascular disease)   . Pain in limb   . CHF (congestive heart failure) No documented echo in chart.      Past Surgical History  Procedure Date  . Leg surgery     Left leg, repair of non-union femur fracture by Dr. Orland Jarred  . Foot tendon surgery 2000    Left foot  . Breast surgery     Right breast carcinoma in situ  . Tubal ligation     Family History  Problem Relation Age of Onset  . Arthritis Mother   . Hypertension Mother   . Heart disease Father   . Diabetes Father    Social History:  Smoking: 40 pack year history. Denies alcohol or drug use.   Lives at home with her husband Allergies: No Known Allergies   (Not in a hospital admission)  Results for orders placed during the  hospital encounter of 02/02/12 (from the past 48 hour(s))  CBC     Status: Abnormal   Collection Time   02/02/12  3:39 PM      Component Value Range Comment   WBC 17.7 (*) 4.0 - 10.5 (K/uL)    RBC 4.61  3.87 - 5.11 (MIL/uL)    Hemoglobin 12.9  12.0 - 15.0 (g/dL)    HCT 04.5  40.9 - 81.1 (%)    MCV 81.1  78.0 - 100.0 (fL)    MCH 28.0  26.0 - 34.0 (pg)    MCHC 34.5  30.0 - 36.0 (g/dL)    RDW 91.4  78.2 - 95.6 (%)    Platelets 440 (*) 150 - 400 (K/uL)   DIFFERENTIAL     Status: Abnormal   Collection Time   02/02/12  3:39 PM      Component Value Range Comment   Neutrophils Relative 76  43 - 77 (%)    Neutro Abs 13.5 (*) 1.7 - 7.7 (K/uL)    Lymphocytes Relative 16  12 - 46 (%)    Lymphs Abs 2.9  0.7 - 4.0 (K/uL)    Monocytes Relative 6  3 - 12 (%)    Monocytes Absolute 1.1 (*) 0.1 - 1.0 (  K/uL)    Eosinophils Relative 1  0 - 5 (%)    Eosinophils Absolute 0.2  0.0 - 0.7 (K/uL)    Basophils Relative 0  0 - 1 (%)    Basophils Absolute 0.0  0.0 - 0.1 (K/uL)   BASIC METABOLIC PANEL     Status: Abnormal   Collection Time   02/02/12  3:39 PM      Component Value Range Comment   Sodium 135  135 - 145 (mEq/L)    Potassium 3.9  3.5 - 5.1 (mEq/L)    Chloride 96  96 - 112 (mEq/L)    CO2 25  19 - 32 (mEq/L)    Glucose, Bld 140 (*) 70 - 99 (mg/dL)    BUN 19  6 - 23 (mg/dL)    Creatinine, Ser 1.61  0.50 - 1.10 (mg/dL)    Calcium 09.6  8.4 - 10.5 (mg/dL)    GFR calc non Af Amer 73 (*) >90 (mL/min)    GFR calc Af Amer 85 (*) >90 (mL/min)   SEDIMENTATION RATE     Status: Abnormal   Collection Time   02/02/12  3:39 PM      Component Value Range Comment   Sed Rate 63 (*) 0 - 22 (mm/hr)    Dg Foot Complete Right  02/02/2012  *RADIOLOGY REPORT*  Clinical Data: Wound at heel question osteomyelitis, history diabetes, fell 2 weeks ago  RIGHT FOOT COMPLETE - 3+ VIEW  Comparison: None.  Findings: Diffuse soft tissue swelling. Osseous demineralization. Joint spaces preserved. Soft tissue irregularity and  dressing artifacts dorsal to the calcaneus and Achilles insertion. Suspicious area of potential bone destruction is seen at the posterior margin of the calcaneus suspicious for osteomyelitis. Small plantar calcaneal spur. No additional fracture, dislocation or bone destruction seen.  IMPRESSION: Significant soft tissue swelling diffusely in right foot with osseous demineralization. Suspected area of bone destruction at the posterior margin of the calcaneus deep to the known wound suspicious for osteomyelitis. If further imaging is required recommend MR imaging with and without contrast.  Original Report Authenticated By: Lollie Marrow, M.D.    Review of Systems  Constitutional: Positive for chills. Negative for fever, weight loss and diaphoresis.  Respiratory: Negative for cough and shortness of breath.   Cardiovascular: Positive for orthopnea and leg swelling. Negative for chest pain and palpitations.       3+ pitting in right foot and leg up to knee.  2+ pitting edema in left foot and leg up to knee  Gastrointestinal: Positive for constipation. Negative for nausea, vomiting and abdominal pain.       Fecal and urinary rentention at baseline from paraplegia. Manually disimpacted every 3 days. Self catherization.   Skin: Negative for rash.  Neurological: Negative for headaches.    Otherwise, reported in HPI Blood pressure 130/52, pulse 75, temperature 97.9 F (36.6 C), temperature source Oral, resp. rate 18, height 4' 11.06" (1.5 m), weight 179 lb 14.3 oz (81.6 kg), SpO2 100.00%. Physical Exam  Constitutional: She is oriented to person, place, and time. She appears well-nourished.       Obese, no acute distress, alert and cooperative  HENT:  Head: Normocephalic.  Mouth/Throat: Oropharynx is clear and moist.  Eyes: Conjunctivae and EOM are normal. Pupils are equal, round, and reactive to light.  Neck: Normal range of motion.  Cardiovascular: Normal rate and regular rhythm.   Murmur heard.       3/6 systolic murmur best heard at the right  sternal border  Respiratory: Effort normal and breath sounds normal.       Crackles in the lower bases  GI: Soft. She exhibits no distension. There is no tenderness. There is no guarding.  Neurological: She is alert and oriented to person, place, and time.  Skin: Skin is warm. There is erythema.       Right foot: ulcer with granulation tissue on the medial side of the great toe with necrotic tissue surrounding it.  Heel: necrotic tissue at base of heel: ~4cm in diameter. Odor present from foot.  Erythema and warmth present.      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  # Right foot ulcer: concern for osteomyelitis vs cellulitis. Xray findings suspicious for osteo. Patient has been afebrile and vitals remain stable. Patient empirically started on vanc and zosyn in the ED.  - follow up MRI of foot - repeat CBC in morning since WBC was elevated today at 17.7 - elevated sed rate. Follow up CRP.  - monitor for any evidence of fever  # Diabetes: on NPH 70/30 12 u bid at home. She reports CBG's in the 200 range. No recent A1C. - follow up A1C - start NPH 70/30 12 u qhs and adjust accordingly - sliding scale coverage  # h/o spinal cord infarct: - continue home meds for spasms and pain: baclofen 10bid, gabapentin 300 qid, methocarbamol - for overative bladder: oxybutinin 10mg  qd and nitrofurantoin ppx for retention.  # h/o CHF: unclear if this is systolic or diastolic. This is from patient's report. Does have some evidence of mild fluid overload on exam, but patient is not acutely symptomatic as would be the case in a CHF exacerbation. No mention of lasix in home meds - obtain daily weights - strict I's and O's    # h/o gout: Resume home  allopurinol  # h/o HTN:  - continue home meds and monitor for low BP.   #h/o hypothyroid: - continue home levothyroxine PPx: - heparin sub q  Fen/Gi: patient is afebrile with stable vital signs and emergent surgery not anticipated. Will therefore order diabetic diet for patient.  Dispo: pending improvement.    Marena Chancy 02/02/2012, 8:18 PM   I have seen and examined the above pt with Losq.  I agree with the above physical exam and assessment and plan as per above.   Jane Broughton S. Edmonia James, MD [facility] PGY-3

## 2012-02-03 ENCOUNTER — Inpatient Hospital Stay (HOSPITAL_COMMUNITY): Payer: Medicare Other

## 2012-02-03 DIAGNOSIS — L97909 Non-pressure chronic ulcer of unspecified part of unspecified lower leg with unspecified severity: Secondary | ICD-10-CM

## 2012-02-03 DIAGNOSIS — M8610 Other acute osteomyelitis, unspecified site: Secondary | ICD-10-CM

## 2012-02-03 DIAGNOSIS — R9430 Abnormal result of cardiovascular function study, unspecified: Secondary | ICD-10-CM

## 2012-02-03 DIAGNOSIS — R0989 Other specified symptoms and signs involving the circulatory and respiratory systems: Secondary | ICD-10-CM

## 2012-02-03 DIAGNOSIS — M869 Osteomyelitis, unspecified: Secondary | ICD-10-CM | POA: Diagnosis present

## 2012-02-03 DIAGNOSIS — L0291 Cutaneous abscess, unspecified: Secondary | ICD-10-CM

## 2012-02-03 DIAGNOSIS — R6521 Severe sepsis with septic shock: Secondary | ICD-10-CM

## 2012-02-03 DIAGNOSIS — R4182 Altered mental status, unspecified: Secondary | ICD-10-CM

## 2012-02-03 DIAGNOSIS — I160 Hypertensive urgency: Secondary | ICD-10-CM | POA: Insufficient documentation

## 2012-02-03 DIAGNOSIS — E119 Type 2 diabetes mellitus without complications: Secondary | ICD-10-CM

## 2012-02-03 DIAGNOSIS — G8220 Paraplegia, unspecified: Secondary | ICD-10-CM

## 2012-02-03 DIAGNOSIS — R6520 Severe sepsis without septic shock: Secondary | ICD-10-CM

## 2012-02-03 DIAGNOSIS — I70269 Atherosclerosis of native arteries of extremities with gangrene, unspecified extremity: Secondary | ICD-10-CM

## 2012-02-03 DIAGNOSIS — R34 Anuria and oliguria: Secondary | ICD-10-CM

## 2012-02-03 DIAGNOSIS — E1169 Type 2 diabetes mellitus with other specified complication: Principal | ICD-10-CM

## 2012-02-03 DIAGNOSIS — E118 Type 2 diabetes mellitus with unspecified complications: Secondary | ICD-10-CM

## 2012-02-03 DIAGNOSIS — L97509 Non-pressure chronic ulcer of other part of unspecified foot with unspecified severity: Secondary | ICD-10-CM

## 2012-02-03 DIAGNOSIS — A419 Sepsis, unspecified organism: Secondary | ICD-10-CM

## 2012-02-03 LAB — PROTIME-INR
INR: 1.16 (ref 0.00–1.49)
Prothrombin Time: 15 seconds (ref 11.6–15.2)

## 2012-02-03 LAB — APTT: aPTT: 42 seconds — ABNORMAL HIGH (ref 24–37)

## 2012-02-03 LAB — COMPREHENSIVE METABOLIC PANEL
Alkaline Phosphatase: 85 U/L (ref 39–117)
BUN: 9 mg/dL (ref 6–23)
Chloride: 104 mEq/L (ref 96–112)
Creatinine, Ser: 0.77 mg/dL (ref 0.50–1.10)
GFR calc Af Amer: >90 mL/min (ref >90)
GFR calc non Af Amer: 84 mL/min — ABNORMAL LOW (ref >90)
Glucose, Bld: 107 mg/dL — ABNORMAL HIGH (ref 70–99)
Potassium: 3.7 mEq/L (ref 3.5–5.1)
Total Bilirubin: 0.2 mg/dL — ABNORMAL LOW (ref 0.3–1.2)

## 2012-02-03 LAB — TSH: TSH: 1.543 u[IU]/mL (ref 0.350–4.500)

## 2012-02-03 LAB — CORTISOL: Cortisol, Plasma: 14.6 ug/dL

## 2012-02-03 LAB — URINALYSIS, ROUTINE W REFLEX MICROSCOPIC
Ketones, ur: NEGATIVE mg/dL
Nitrite: NEGATIVE
Protein, ur: NEGATIVE mg/dL
Urobilinogen, UA: 1 mg/dL (ref 0.0–1.0)

## 2012-02-03 LAB — CBC
HCT: 33.8 % — ABNORMAL LOW (ref 36.0–46.0)
HCT: 31.9 % — ABNORMAL LOW (ref 36.0–46.0)
Hemoglobin: 11.2 g/dL — ABNORMAL LOW (ref 12.0–15.0)
Hemoglobin: 10.6 g/dL — ABNORMAL LOW (ref 12.0–15.0)
MCH: 27.2 pg (ref 26.0–34.0)
MCV: 82.6 fL (ref 78.0–100.0)
MCV: 81.8 fL (ref 78.0–100.0)
RBC: 3.9 MIL/uL (ref 3.87–5.11)
RDW: 14.5 % (ref 11.5–15.5)
WBC: 13.7 10*3/uL — ABNORMAL HIGH (ref 4.0–10.5)
WBC: 15.1 10*3/uL — ABNORMAL HIGH (ref 4.0–10.5)

## 2012-02-03 LAB — URINE MICROSCOPIC-ADD ON

## 2012-02-03 LAB — URIC ACID: Uric Acid, Serum: 5.2 mg/dL (ref 2.4–7.0)

## 2012-02-03 LAB — DIFFERENTIAL
Eosinophils Absolute: 0.1 10*3/uL (ref 0.0–0.7)
Eosinophils Relative: 1 % (ref 0–5)
Lymphocytes Relative: 23 % (ref 12–46)
Lymphs Abs: 3.4 10*3/uL (ref 0.7–4.0)
Monocytes Absolute: 1.4 10*3/uL — ABNORMAL HIGH (ref 0.1–1.0)
Monocytes Relative: 9 % (ref 3–12)

## 2012-02-03 LAB — BASIC METABOLIC PANEL
CO2: 26 mEq/L (ref 19–32)
Chloride: 101 mEq/L (ref 96–112)
Creatinine, Ser: 0.82 mg/dL (ref 0.50–1.10)
Potassium: 3.7 mEq/L (ref 3.5–5.1)

## 2012-02-03 LAB — PHOSPHORUS: Phosphorus: 3.5 mg/dL (ref 2.3–4.6)

## 2012-02-03 LAB — GLUCOSE, CAPILLARY
Glucose-Capillary: 109 mg/dL — ABNORMAL HIGH (ref 70–99)
Glucose-Capillary: 148 mg/dL — ABNORMAL HIGH (ref 70–99)

## 2012-02-03 LAB — CARDIAC PANEL(CRET KIN+CKTOT+MB+TROPI): CK, MB: 2.9 ng/mL (ref 0.3–4.0)

## 2012-02-03 LAB — FIBRINOGEN: Fibrinogen: 736 mg/dL — ABNORMAL HIGH (ref 204–475)

## 2012-02-03 LAB — LDL CHOLESTEROL, DIRECT: Direct LDL: 70 mg/dL

## 2012-02-03 LAB — C-REACTIVE PROTEIN: CRP: 8.4 mg/dL — ABNORMAL HIGH (ref <0.60)

## 2012-02-03 LAB — CARBOXYHEMOGLOBIN
O2 Saturation: 71.5 %
Total hemoglobin: 9.3 g/dL — ABNORMAL LOW (ref 12.5–16.0)

## 2012-02-03 LAB — MRSA PCR SCREENING: MRSA by PCR: NEGATIVE

## 2012-02-03 LAB — MAGNESIUM: Magnesium: 1.6 mg/dL (ref 1.5–2.5)

## 2012-02-03 LAB — LACTIC ACID, PLASMA: Lactic Acid, Venous: 0.8 mmol/L (ref 0.5–2.2)

## 2012-02-03 MED ORDER — PIPERACILLIN-TAZOBACTAM 3.375 G IVPB 30 MIN: 3.3750 g | Freq: Once | INTRAVENOUS | Status: DC

## 2012-02-03 MED ORDER — BIOTENE DRY MOUTH MT LIQD
15.0000 mL | Freq: Two times a day (BID) | OROMUCOSAL | Status: DC
  Administered 2012-02-03 – 2012-02-09 (×10): 15 mL via OROMUCOSAL

## 2012-02-03 MED ORDER — SODIUM CHLORIDE 0.9 % IV BOLUS (SEPSIS)
25.0000 mL/kg | Freq: Once | INTRAVENOUS | Status: AC
  Administered 2012-02-03: 2040 mL via INTRAVENOUS

## 2012-02-03 MED ORDER — VANCOMYCIN HCL IN DEXTROSE 1-5 GM/200ML-% IV SOLN
1000.0000 mg | Freq: Two times a day (BID) | INTRAVENOUS | Status: DC
  Administered 2012-02-03 – 2012-02-07 (×8): 1000 mg via INTRAVENOUS
  Filled 2012-02-03 (×13): qty 200

## 2012-02-03 MED ORDER — VANCOMYCIN HCL IN DEXTROSE 1-5 GM/200ML-% IV SOLN: 1000.0000 mg | Freq: Once | INTRAVENOUS | Status: DC

## 2012-02-03 MED ORDER — DOBUTAMINE IN D5W 4-5 MG/ML-% IV SOLN
2.5000 ug/kg/min | INTRAVENOUS | Status: DC
  Administered 2012-02-03: 2.5 ug/kg/min via INTRAVENOUS
  Filled 2012-02-03: qty 250

## 2012-02-03 MED ORDER — SODIUM CHLORIDE 0.9 % IV SOLN
INTRAVENOUS | Status: DC
  Administered 2012-02-03 – 2012-02-04 (×2): via INTRAVENOUS

## 2012-02-03 NOTE — Progress Notes (Signed)
Pt was noted to have a blood pressure of 61/48 manually in her left arm while having her doppler study done. Her pressure was checked in her right arm and it was 61/48 as well. Pt was lethargic, but arousable. o2 sats were in the 80's on room air although pt denied any sob. Pt was placed on 2L  [place]. MD was called and gave an order to give an bolus of 500cc., pt had an EKG done which showed sinus brady at 59. RRT was called to assess the pt as well. After receiving some fluids pt became more alert and oriented and her pressures in the right arm came up to 126/51 pulse 76. Orders were placed to transfer pt to ICU for closer monitoring. Now awating bed for transfer. Family at the bedside. Will cont to monitor.

## 2012-02-03 NOTE — Significant Event (Signed)
Was called to bedside for BP in her right arm of 60's/40's. Came to bedside. NS bolus was started. Patient was lethargic but able to answer questions. She denied any chest pain, shortness of breath. O2 sats were at 93%. Patient was afebrile by axillary temperature.  Left sided BP was 100's systolic while right was 64 systolic. HR: 70.  CBC, blood culture, procalcitonin, lactic acid, BMP, cardiac enzymes were ordered. EKG was obtained.  Rapid response was called and CCM was called for admission to the ICU.

## 2012-02-03 NOTE — H&P (Deleted)
I have seen and examined this patient. I have discussed with Dr(s) Losq and Caviness .  I agree with their findings and plans as documented in their admission notes. Acute Issues 1. 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 Start Rivaroxaban 15 mg TWICE DAILY x 3 weeks, then 20 mg daily. Consider prolonged duration of therapy given recurrent VTE (> = 12 months). Patient is interested in the card that will give him free three-week supply of Xareltro.    2. Give prescription for Graduated Compression Stockings, Knee high with 30 to 40 mmHg compression to wear daily for 2 years to prevent post-phlebitic syndrome.  Pt may fill and fit at a medical supply shop.  3. Anticipate discharge today.

## 2012-02-03 NOTE — Progress Notes (Signed)
Family Medicine Teaching Service Daily Progress Note: [id number] Subjective: No acute events overnight. No pain in foot.   Objective: Vital signs in last 24 hours: Temp:  [97.1 F (36.2 C)-97.9 F (36.6 C)] 97.1 F (36.2 C) (05/15 0700) Pulse Rate:  [58-83] 58  (05/15 0700) Resp:  [15-18] 18  (05/15 0700) BP: (91-130)/(46-70) 98/46 mmHg (05/15 0700) SpO2:  [93 %-100 %] 95 % (05/15 0700) Weight:  [179 lb 14.3 oz (81.6 kg)] 179 lb 14.3 oz (81.6 kg) (05/14 2042) Weight change:  Last BM Date: 02/01/12 (requires digital stimulation for BM every MWF)  Intake/Output from previous day: 05/14 0701 - 05/15 0700 In: 480 [P.O.:480] Out: 1250 [Urine:1250] Intake/Output this shift:   Constitutional: She is oriented to person, place, and time. Obese, no acute distress in bed.  Cardiovascular: Normal rate and regular rhythm.  3/6 systolic murmur best heard at the right sternal border  Respiratory: Effort normal and breath sounds normal anteriorly GI: Soft. She exhibits no distension. There is no tenderness. There is no guarding.  Neurological: She is alert and oriented to person, place, and time.  Right foot: ulcer with granulation tissue on the medial side of the great toe with necrotic tissue surrounding it.  Heel: necrotic tissue at base of heel: ~4cm in diameter. Odor present from foot.  Erythema and warmth present. Pulses difficult to palpate. +3 pitting edema in left foot. 2+ in right.   Lab Results:  Basename 02/03/12 0540 02/02/12 1539  WBC 13.7* 17.7*  HGB 11.2* 12.9  HCT 33.8* 37.4  PLT 446* 440*   BMET  Basename 02/03/12 0540 02/02/12 1539  NA 137 135  K 3.7 3.9  CL 101 96  CO2 26 25  GLUCOSE 161* 140*  BUN 13 19  CREATININE 0.82 0.81  CALCIUM 8.9 10.1    Studies/Results: Mr Foot Right W Wo Contrast  02/02/2012  *RADIOLOGY REPORT*  Clinical Data: Heel wound with possible osteomyelitis shown on radiography.  MRI OF THE RIGHT FOREFOOT WITHOUT AND WITH CONTRAST   Technique:  Multiplanar, multisequence MR imaging was performed both before and after administration of intravenous contrast.  Contrast: 15mL MULTIHANCE GADOBENATE DIMEGLUMINE 529 MG/ML IV SOLN  Comparison: 02/02/2012  Findings: Abnormal osseous edema and enhancement noted posteriorly in the calcaneus adjacent to the Achilles insertion site, with adjacent subcutaneous edema.  The appearance is compatible with calcaneal osteomyelitis and adjacent cellulitis.  No abscess is observed.  There is extensive abnormal subcutaneous edema laterally and along the dorsum of the foot, which not entirely included on today's ankle examination.  No findings of osteomyelitis involving the distal tibia, distal fibula, talus, midfoot, or metatarsal bases.  Low-level edema and enhancement noted tracking deep to the plantar fascia, without drainable abscess observed.  Nonstandard angulation was utilized to best evaluate the calcaneus, and ligamentous assessment of the ankle is problematic, although no obvious lateral ligamentous complex or deltoid ligament discontinuity is observed, and the flexor tendons of the ankle appear grossly intact. Spring ligament appears intact.  IMPRESSION:  1.  Posterior calcaneal osteomyelitis with adjacent cellulitis. 2.  Suspected low-level fasciitis tracking along the margins of the plantar fascia. 3.  Dorsal subcutaneous edema in the foot probably represents cellulitis given the low-level associated enhancement. 4.  No drainable abscess observed.  Original Report Authenticated By: Dellia Cloud, M.D.   Dg Foot Complete Right  02/02/2012  *RADIOLOGY REPORT*  Clinical Data: Wound at heel question osteomyelitis, history diabetes, fell 2 weeks ago  RIGHT FOOT COMPLETE -  3+ VIEW  Comparison: None.  Findings: Diffuse soft tissue swelling. Osseous demineralization. Joint spaces preserved. Soft tissue irregularity and dressing artifacts dorsal to the calcaneus and Achilles insertion. Suspicious area  of potential bone destruction is seen at the posterior margin of the calcaneus suspicious for osteomyelitis. Small plantar calcaneal spur. No additional fracture, dislocation or bone destruction seen.  IMPRESSION: Significant soft tissue swelling diffusely in right foot with osseous demineralization. Suspected area of bone destruction at the posterior margin of the calcaneus deep to the known wound suspicious for osteomyelitis. If further imaging is required recommend MR imaging with and without contrast.  Original Report Authenticated By: Lollie Marrow, M.D.    Medications:  Scheduled:   . allopurinol  100 mg Oral Daily  . atorvastatin  10 mg Oral Daily  . baclofen  10 mg Oral BID  . gabapentin  300 mg Oral QID  . gadobenate dimeglumine  15 mL Intravenous Once  . heparin  5,000 Units Subcutaneous Q8H  . insulin aspart  0-15 Units Subcutaneous TID WC  . insulin aspart protamine-insulin aspart  12 Units Subcutaneous Q supper  . levothyroxine  75 mcg Oral Daily  . methocarbamol  500 mg Oral QID  . metoprolol tartrate  50 mg Oral Daily  . oxybutynin  10 mg Oral Daily  . pantoprazole  40 mg Oral Q1200  . piperacillin-tazobactam (ZOSYN)  IV  3.375 g Intravenous Once  . piperacillin-tazobactam (ZOSYN)  IV  3.375 g Intravenous Q8H  . sodium chloride  3 mL Intravenous Q12H  . vancomycin  1,000 mg Intravenous Q12H  . DISCONTD: amLODipine  5 mg Oral Daily  . DISCONTD: hydrochlorothiazide  12.5 mg Oral Daily  . DISCONTD: metoprolol-hydrochlorothiazide  0.5 tablet Oral Daily  . DISCONTD: nitrofurantoin  100 mg Oral Daily  . 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  # Right foot ulcer: osteomyelitis on MRI - vanc and zosyn day 2 - obtain ABI's and consult either vascular surgery or ortho depending on results - WBC impproving at 13.7. Currently afebrile.  - sed rate and CRP are elevated.  - npo in case of surgery  # Diabetes: on NPH 70/30 12 u bid at home. She reports CBG's in the 200 range. A1C of 10.2. CBG's between 116 and 161 - continue NPH 70/30 12 u qhs and monitor for hypoglycemia, since patient is currently npo - sliding scale coverage  # h/o spinal cord infarct:  - continue home meds for spasms and pain: baclofen 10bid, gabapentin 300 qid, methocarbamol  - unclear why she is on oxybutinin since she has urinary retention. Will d/c nitrofurantoi for now since on vanc and zosyn.  # h/o CHF:  - obtain daily weights  - strict I's and O's - f/u echo  # h/o gout:  Resume home allopurinol  - check uric acid # h/o HTN:  - hold norvasc and hctz since some hypotension #h/o hypothyroid:  - continue home levothyroxine  - check TSH PPx:  - heparin sub q  Fen/Gi: npo Dispo: pending improvement.    LOS: 1 day   Getsemani Lindon 02/03/2012, 9:41 AM

## 2012-02-03 NOTE — Progress Notes (Signed)
VASCULAR LAB PRELIMINARY  PRELIMINARY  PRELIMINARY  PRELIMINARY  VASCULAR LAB PRELIMINARY  ARTERIAL  ABI completed:    RIGHT    LEFT    PRESSURE WAVEFORM  PRESSURE WAVEFORM  BRACHIAL 60 monophasic BRACHIAL 131 triphasic  DP 30 severely dampened monophasic DP 21 Severely dampened monophasic         PT 23 Severely dampened monophasic PT 50 Severely dampened monophasic           RIGHT LEFT  ABI 0.23 0.38   ABIs and Doppler waveforms suggest a severe reduction in arterial flow bilaterally Pressures in the thigh Right - 40 Left - 78 Pressures in the calf Right - 40 Left - 80  Duplex scan was difficult due to patent's condition. There was moderate to severe calcific and heterogeneous plaque noted throughout the lower extremity bilaterally. 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  Alexandra Wiley D, RVS 02/03/2012, 11:00 PM

## 2012-02-03 NOTE — Progress Notes (Signed)
ANTIBIOTIC CONSULT NOTE - INITIAL  Pharmacy Consult for Vancomycin + Zosyn Indication: R foot osteomyelitis  No Known Allergies  Patient Measurements: Height: 4\' 11"  (149.9 cm) Weight: 179 lb 14.3 oz (81.6 kg) IBW/kg (Calculated) : 43.2   Vital Signs: Temp: 97.1 F (36.2 C) (05/15 0700) Temp src: Oral (05/15 0700) BP: 98/46 mmHg (05/15 0700) Pulse Rate: 58  (05/15 0700) Intake/Output from previous day: 05/14 0701 - 05/15 0700 In: 480 [P.O.:480] Out: 1250 [Urine:1250] Intake/Output from this shift:    Labs:  Basename 02/03/12 0540 02/02/12 1539  WBC 13.7* 17.7*  HGB 11.2* 12.9  PLT 446* 440*  LABCREA -- --  CREATININE 0.82 0.81   Estimated Creatinine Clearance: 60.7 ml/min (by C-G formula based on Cr of 0.82). No results found for this basename: VANCOTROUGH:2,VANCOPEAK:2,VANCORANDOM:2,GENTTROUGH:2,GENTPEAK:2,GENTRANDOM:2,TOBRATROUGH:2,TOBRAPEAK:2,TOBRARND:2,AMIKACINPEAK:2,AMIKACINTROU:2,AMIKACIN:2, in the last 72 hours   Microbiology: No results found for this or any previous visit (from the past 720 hour(s)).  Medical History: Past Medical History  Diagnosis Date  . Ulcer   . Hypertension   . Heart murmur   . Gout   . PVD (peripheral vascular disease)   . Pain in limb   . CHF (congestive heart failure)   . 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 Conus medullaris syndrome   . Arthritis   . Breast cancer ~ 1975    right  . Carpal tunnel syndrome of right wrist   . High cholesterol   . Angina   . Myocardial infarction 1996  . Pneumonia ~ 2002; ~1975  . Type II diabetes mellitus   . Exertional dyspnea   . Hypothyroidism   . Seizures 02/02/12    "used to; a long time ago"  . Stroke 10/2008    "spinal cord"    Assessment: 66 y.o. F on Vancomycin + Zosyn for osteo of R foot (confirmed with MRI on 5/14). Afebrile, WBC 13.7 << 17.7 (trending down), SCr: 0.82, CrCl~60-70 ml/min. Will adjust antibiotics slightly today given renal function and estimated  Vanc kinetics.   Pharmacy System-Based Medication Review Anticoagulation: None PTA, Hep SQ for VTE px Infectious Disease: Vanc/Zosyn Rx#2 (5/14 >> current)  for r/o osteomyelitis of R foot. Afebrile, WBC 13.7 << 17.7. SCr 0.82 (quadraplegic, baseline appears to be 0.7-0.8), CrCl~60-70 ml/min? MRI(5/14): Osteo with adjacent cellulitis of R foot. Estimated Vanc kinetics (ke~0.054, Cmax~31.7, Vd~58.75, estimated MD~912 mg IV every 12 hours). Will reduce dose today in the setting of some reduced muscle mass d/t loss of function in BLE >> unknown CrCl. Will check trough at steady state to ensure dose appropriate for osteo Cardiovascular: Hx CHF/HTN/PVD. BP/24h: 90-130/45-55, HR: 60-80. On atorvastatin, lopressor. (PTA norvasc) Endocrinology: Hx DM/gout/hypothyroidism. A1c 10.2, CBG/24h: 140-161. On novolin 70/30 12 units with supper, moderate SSI tidwc. No THS this admit. On home synthroid. No uric acid levels or active gout process. On home allopurinol (PTA colchicine held)  Gastrointestinal / Nutrition: NPO. On protonix (was on PPI PTA) Neurology: Hx CVA('09)/conus medullaris/"paraplegia" d/t spinal cord infarct/back spasms. Pt not completely paraplegic -- has BLE weakness from CVA, can walk around on a limited basis, utilizes manual wheelchair. On baclofen, methocarbamol, prn OxyIR Nephrology: Hx overactive bladder. On oxybutynin. SCr 0.82, CrCl~60-70 ml/min. Lytes ok.  PTA Medication Issues: HCTZ, norvasc, colchicine, nortriptyline held  Best Practices: Hep SQ, home meds addressed  Goal of Therapy:  Vancomycin trough level 15-20 mcg/ml  Plan:  1. Reduce Vancomycin to 1g IV every 12 hours 2. Continue Zosyn 3.375g IV every 8 hours 3. Will  continue to follow renal function, culture results, LOT, and antibiotic de-escalation plans   Georgina Pillion, PharmD, BCPS Clinical Pharmacist Pager: [phone] 02/03/2012 9:16 AM

## 2012-02-03 NOTE — Consult Note (Signed)
Pt smokes 1/2 ppd and is in action stage. She plans to quit cold [place] after d/c. Congratulated and encouraged pt to quit completely. Discussed NRT's options as well. Referred to 1-800 quit now for f/u and support. Discussed oral fixation substitutes, second hand smoke and in home smoking policy. Reviewed and gave pt Written education/contact information.

## 2012-02-03 NOTE — Progress Notes (Signed)
Patient ID: [name], female   DOB: [birth year], 66 y.o.   MRN: [id number] [facility] Specialist of [facility]  Vascular Consult Note    Patient name: SHERRE WOOTON MRN: [id number] DOB: January 14, 1946 Sex: female   Referred by: Delacruz  Reason for referral: Gangrene right foot  HISTORY OF PRESENT ILLNESS: Patient is a 66 year old black female admitted with gangrene and tissue loss on her right foot. She has eschar over her entire heel and is foul-smelling. She does have dry gangrene of her right fifth toe and also over the medial first metatarsal head to perch and reports mild pain. 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 She her husband is present with her. They report that she is able to stand and transfer. She is able to walk he slight amount with the assistance of a walker. I have not seen her will get to confirm this. She has been a great amount of time and bedridden. She has extensive past medical history with congestive heart failure and prior mark cardio infarction. She is a type II diabetic.  Past Medical History  Diagnosis Date  . Ulcer   . Hypertension   . Heart murmur   . Gout   . PVD (peripheral vascular disease)   . Pain in limb   . CHF (congestive heart failure)   . 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 Conus medullaris syndrome   . Arthritis   . Breast cancer ~ 1975    right  . Carpal tunnel syndrome of right wrist   . High cholesterol   . Angina   . Myocardial infarction 1996  . Pneumonia ~ 2002; ~1975  . Type II diabetes mellitus   . Exertional dyspnea   . Hypothyroidism   . Seizures 02/02/12    "used to; a long time ago"  . Stroke 10/2008    "spinal cord"    Past Surgical History  Procedure Date  . Foot tendon surgery 2000    Left foot  . Tubal ligation   . Femur fracture surgery 1960's    Left leg, repair of non-union femur fracture by Dr. Orland Jarred  . Breast lumpectomy ~1975    Right  breast carcinoma in situ  . Tonsillectomy and adenoidectomy ~ 1961  . Dilation and curettage of uterus   . Fracture surgery     History   Social History  . Marital Status: Married    Spouse Name: N/A    Number of Children: N/A  . Years of Education: N/A   Occupational History  . Not on file.   Social History Main Topics  . Smoking status: Current Everyday Smoker -- 0.5 packs/day for 25 years    Types: Cigarettes  . Smokeless tobacco: Never Used   Comment: "quit for 7 months; started back 10/2011"  . Alcohol Use: Yes     02/02/12 "last alcohol ~ 2008"  . Drug Use: No  . Sexually Active: No   Other Topics Concern  . Not on file   Social History Narrative  . No narrative on file    Family History  Problem Relation Age of Onset  . Arthritis Mother   . Hypertension Mother   . Heart disease Father   . Diabetes Father     No Known Allergies  Prior to Admission medications   Medication Sig Start Date End Date Taking? Authorizing Provider  allopurinol (ZYLOPRIM) 100 MG tablet Take 100  mg by mouth daily.     Yes Historical Provider, MD  amLODipine (NORVASC) 5 MG tablet Take 5 mg by mouth daily.     Yes Historical Provider, MD  aspirin EC 81 MG tablet Take 81 mg by mouth daily.   Yes Historical Provider, MD  atorvastatin (LIPITOR) 10 MG tablet Take 10 mg by mouth daily.   Yes Historical Provider, MD  baclofen (LIORESAL) 10 MG tablet Take 10 mg by mouth 2 (two) times daily.    Yes Historical Provider, MD  colchicine 0.6 MG tablet Take 0.6 mg by mouth every 12 (twelve) hours as needed. For gout. 11/18/11 11/17/12 Yes Ranelle Oyster, MD  diclofenac sodium (VOLTAREN) 1 % GEL Apply 1 application topically 3 (three) times daily as needed. For pain   Yes Historical Provider, MD  gabapentin (NEURONTIN) 300 MG capsule Take 300 mg by mouth 4 (four) times daily.     Yes Historical Provider, MD  insulin aspart protamine-insulin aspart (NOVOLOG 70/30) (70-30) 100 UNIT/ML injection Inject  12 Units into the skin as directed. Sliding scale. 12 units if blood sugar is 150, 16 units if sugar is 200 and above   Yes Historical Provider, MD  levothyroxine (SYNTHROID, LEVOTHROID) 75 MCG tablet Take 75 mcg by mouth daily.     Yes Historical Provider, MD  methocarbamol (ROBAXIN) 500 MG tablet Take 500 mg by mouth 4 (four) times daily.     Yes Historical Provider, MD  metoprolol-hydrochlorothiazide (LOPRESSOR HCT) 100-25 MG per tablet Take 0.5 tablets by mouth daily. 1/2 tablet daily   Yes Historical Provider, MD  nitrofurantoin (MACRODANTIN) 100 MG capsule Take 100 mg by mouth daily.     Yes Historical Provider, MD  nortriptyline (PAMELOR) 25 MG capsule TAKE ONE CAPSULE BY MOUTH AT BEDTIME 01/17/12  Yes Ranelle Oyster, MD  omeprazole (PRILOSEC) 20 MG capsule Take 20 mg by mouth daily.     Yes Historical Provider, MD  oxybutynin (DITROPAN-XL) 10 MG 24 hr tablet Take 10 mg by mouth daily.     Yes Historical Provider, MD  oxycodone (OXY-IR) 5 MG capsule Take 5 mg by mouth every 6 (six) hours as needed. For pain 11/18/11  Yes Ranelle Oyster, MD     REVIEW OF SYSTEMS: Documented and reviewed in her history and physical. Nothing significant other than above history of present illness and past medical history Constitutional: No fever or chills.  PHYSICAL EXAMINATION:  Filed Vitals:   02/03/12 1306  BP: 99/47  Pulse: 78  Temp: 98.2 F (36.8 C)  Resp: 18    General: The patient appears their stated age. Pulmonary: There is a good air exchange bilaterally  Abdomen: Soft and non-tender , morbidly obese Musculoskeletal: There are no major deformities.  There is no significant extremity pain. Neurologic: Marked lower extremity weakness Skin: Small ulcer on the left medial heel which is partial thickness, a large necrotic full thickness heel ulcer on the right with dry gangrene of her right fifth toe and medial first metatarsal head. Psychiatric: The patient has normal  affect. Cardiovascular: There is a regular rate and rhythm without significant murmur appreciated. Pulse status, a markedly diminished femoral pulses no popliteal or distal pulses Diagnostic Studies: MRI of her foot reveals osteo-of her heel on the right    Medication Changes: None  Assessment:  Gangrene with nonviable foot on the right with extensive heel necrosis extending into the calcaneus  Plan: I discussed this at length with the patient and her husband present.  I explained that even with revascularization she has a nonviable foot. I have ordered lower extremity arterial studies to determine if she is a candidate for right below-knee amputation versus above-knee amputation. She does also need evaluation of her left foot. Ex-preemie importance of keeping pressure off her left heel that she does not have the same problem on the left side. Tentatively he scheduled right below-knee or above-knee amputation on 02/05/2012  Marabella Popiel 5/15/20133:19 PM  2

## 2012-02-03 NOTE — Progress Notes (Signed)
Chart reviewed, CM following for progression and d/c needs. Noted possible start on Xarelto, and referrence to "free three week card". To clarify, cards are for 5 or 10 days only, and then only if pt is uninsured. This pt has two insurances. Will continue to follow of progression.  Johny Shock RN MPH Case Manager [phone]

## 2012-02-03 NOTE — Procedures (Signed)
Central Venous Catheter Insertion Procedure Note YULEIDY RAPPLEYE [id number] 10/22/1945  Procedure: Insertion of Central Venous Catheter Indications: Drug and/or fluid administration  Procedure Details Consent: Risks of procedure as well as the alternatives and risks of each were explained to the (patient/caregiver).  Consent for procedure obtained. Time Out: Verified patient identification, verified procedure, site/side was marked, verified correct patient position, special equipment/implants available, medications/allergies/relevent history reviewed, required imaging and test results available.  Performed  Maximum sterile technique was used including antiseptics, cap, gloves, gown, hand hygiene, mask and sheet. Skin prep: Chlorhexidine; local anesthetic administered A antimicrobial bonded/coated triple lumen catheter was placed in the right internal jugular vein using the Seldinger technique.  Evaluation Blood flow good Complications: No apparent complications Patient did tolerate procedure well. Chest X-ray ordered to verify placement.  CXR: pending.  U/S used in placement.  Betzaida Cremeens 02/03/2012, 6:04 PM

## 2012-02-03 NOTE — H&P (Signed)
I have seen and examined this patient. I have discussed with Dr(s) Losq and Caviness.  I agree with their findings and plans as documented in their admission notes. Acute Issues 1. 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 - Personal History of Possible peripheral artery disease. Currently with dry, darkened right foot 5th digit that my represent ischemic tissue.   2. Cellulitis of right ankle and foot  3. Right foot fasciitis, infectious  4. Diabetic foot ulcer, medial dorsum of 1st MTP region.  5. Hypotension - suspect secondary to antihypertensives and inadequate per oral intake.  6. Tobacco Dependence  7. History of Spinal Stroke with partial paraplegia.  Patient has recently been able to ambulate short distances in her home within the last month or two per her report.   8. History of heart Failure- No echocardiogram in 4 years per patient.   _Recommendations: 1. Agree with empiric coverage for osteomyelitis with Vanc and Zosyn. 2. Obtain arterial dopplers/ABI of legs to look for clinically relevant PAD. 3.  Hold Amoldipine and HCTZ 4. IVF boluses as needed for SBP < 100.  5. Consider consultation with either Vascular Surgery or Orthopedic Surgery based on Arterial doppler findings for evaluation for need of amputation or revascularization. 6. Check LDL (ordered) 7. Smoking Cessation consult (ordered) 8. Check TSH (ordered) 9. Check Echocardiogram for ejection fraction estimate and evaluation of murmur (ordered) 10. Keep right heel suspended off mattress and off pillow using pillow under right calf. (ordered) 11. Colloid dressing to wound at right great toe for autolysis of eschar. (ordered)

## 2012-02-03 NOTE — Consult Note (Signed)
Name: Alexandra Wiley MRN: [id number] DOB: March 29, 1946    LOS: 1  [facility] / Critical Care Note   History of Present Illness:  66 y/o BF, smoker with PMH of HTN, Gout, PVD, CHF, DM on insulin, spinal cord infarct (?walks with walker) admitted on 5/14 with a 3 week history of ulceration on R medial metatarsophalangeal joint area and heel.  Two days prior to presentation, the areas began drain and have foul oder.  She was evaluated by her PCP and XRAY was concerning for osteomyelitis.  5/15 afternoon patient was noted to have drop in blood pressure with normal mentation. PCCM called for consult 5/15 by [facility] FPTS out of concern for sepsis / shock.    Lines / Drains: IJ TLC 5/15>>>  Cultures: 5/15 BCx2>>> 5/15 Sputum>>> 5/15 UA>>> 5/15 UC>>>  Antibiotics: 5/14 Vanco (osteo)>>> 5/14 Zosyn (oseto)>>>  Tests / Events: 5/14 Foot XRAY>>>Significant soft tissue swelling diffusely in right foot with osseous demineralization. Suspected area of bone destruction at the posterior margin of the calcaneus deep to the known wound suspicious for osteomyelitis. If further imaging is required recommend MR imaging with and without contrast. 5/14 R FOOT MRI>>>Posterior calcaneal osteomyelitis with adjacent cellulitis.  Suspected low-level fasciitis tracking along the margins of the plantar fascia. Dorsal subcutaneous edema in the foot probably represents cellulitis given the low-level associated enhancement.  No drainable abscess observed     Past Medical History  Diagnosis Date  . Ulcer   . Hypertension   . Heart murmur   . Gout   . PVD (peripheral vascular disease)   . Pain in limb   . CHF (congestive heart failure)   . 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 Conus medullaris syndrome   . Arthritis   . Breast cancer ~ 1975    right  . Carpal tunnel syndrome of right wrist   . High cholesterol   . Angina   . Myocardial infarction 1996  . Pneumonia ~ 2002; ~1975  . Type II  diabetes mellitus   . Exertional dyspnea   . Hypothyroidism   . Seizures 02/02/12    "used to; a long time ago"  . Stroke 10/2008    "spinal cord"    Past Surgical History  Procedure Date  . Foot tendon surgery 2000    Left foot  . Tubal ligation   . Femur fracture surgery 1960's    Left leg, repair of non-union femur fracture by Dr. Orland Jarred  . Breast lumpectomy ~1975    Right breast carcinoma in situ  . Tonsillectomy and adenoidectomy ~ 1961  . Dilation and curettage of uterus   . Fracture surgery     Prior to Admission medications   Medication Sig Start Date End Date Taking? Authorizing Provider  allopurinol (ZYLOPRIM) 100 MG tablet Take 100 mg by mouth daily.     Yes Historical Provider, MD  amLODipine (NORVASC) 5 MG tablet Take 5 mg by mouth daily.     Yes Historical Provider, MD  aspirin EC 81 MG tablet Take 81 mg by mouth daily.   Yes Historical Provider, MD  atorvastatin (LIPITOR) 10 MG tablet Take 10 mg by mouth daily.   Yes Historical Provider, MD  baclofen (LIORESAL) 10 MG tablet Take 10 mg by mouth 2 (two) times daily.    Yes Historical Provider, MD  colchicine 0.6 MG tablet Take 0.6 mg by mouth every 12 (twelve) hours as needed. For gout. 11/18/11 11/17/12 Yes Ranelle Oyster,  MD  diclofenac sodium (VOLTAREN) 1 % GEL Apply 1 application topically 3 (three) times daily as needed. For pain   Yes Historical Provider, MD  gabapentin (NEURONTIN) 300 MG capsule Take 300 mg by mouth 4 (four) times daily.     Yes Historical Provider, MD  insulin aspart protamine-insulin aspart (NOVOLOG 70/30) (70-30) 100 UNIT/ML injection Inject 12 Units into the skin as directed. Sliding scale. 12 units if blood sugar is 150, 16 units if sugar is 200 and above   Yes Historical Provider, MD  levothyroxine (SYNTHROID, LEVOTHROID) 75 MCG tablet Take 75 mcg by mouth daily.     Yes Historical Provider, MD  methocarbamol (ROBAXIN) 500 MG tablet Take 500 mg by mouth 4 (four) times daily.     Yes  Historical Provider, MD  metoprolol-hydrochlorothiazide (LOPRESSOR HCT) 100-25 MG per tablet Take 0.5 tablets by mouth daily. 1/2 tablet daily   Yes Historical Provider, MD  nitrofurantoin (MACRODANTIN) 100 MG capsule Take 100 mg by mouth daily.     Yes Historical Provider, MD  nortriptyline (PAMELOR) 25 MG capsule TAKE ONE CAPSULE BY MOUTH AT BEDTIME 01/17/12  Yes Ranelle Oyster, MD  omeprazole (PRILOSEC) 20 MG capsule Take 20 mg by mouth daily.     Yes Historical Provider, MD  oxybutynin (DITROPAN-XL) 10 MG 24 hr tablet Take 10 mg by mouth daily.     Yes Historical Provider, MD  oxycodone (OXY-IR) 5 MG capsule Take 5 mg by mouth every 6 (six) hours as needed. For pain 11/18/11  Yes Ranelle Oyster, MD   Allergies No Known Allergies  Family History Family History  Problem Relation Age of Onset  . Arthritis Mother   . Hypertension Mother   . Heart disease Father   . Diabetes Father    Social History  reports that she has been smoking Cigarettes.  She has a 12.5 pack-year smoking history. She has never used smokeless tobacco. She reports that she drinks alcohol. She reports that she does not use illicit drugs.  Review Of Systems:   Vital Signs: Temp:  [97.1 F (36.2 C)-98.2 F (36.8 C)] 98.2 F (36.8 C) (05/15 1306) Pulse Rate:  [42-79] 48  (05/15 1651) Resp:  [17-18] 18  (05/15 1306) BP: (61-130)/(38-70) 113/51 mmHg (05/15 1701) SpO2:  [93 %-100 %] 97 % (05/15 1306) Weight:  [179 lb 14.3 oz (81.6 kg)] 179 lb 14.3 oz (81.6 kg) (05/14 2042) I/O last 3 completed shifts: In: 480 [P.O.:480] Out: 1250 [Urine:1250]  Physical Examination: General:chronically ill Neuro: AAOx4, speech clear, MAE CV: s1s2 rrr PULM: resp's even/non-labored, lungs bilaterally coarse GI: round/soft, bsx4 active Extremities: cool/dry, chronic LE venous changes, RLE:  Quarter sized, round opening medial aspect of foot, heel with large eschar cap  Labs    CBC Lab 02/03/12 0540 02/02/12 1539  HGB  11.2* 12.9  HCT 33.8* 37.4  WBC 13.7* 17.7*  PLT 446* 440*   BMET  Lab 02/03/12 0540 02/02/12 1539  NA 137 135  K 3.7 3.9  CL 101 96  CO2 26 25  GLUCOSE 161* 140*  BUN 13 19  CREATININE 0.82 0.81  CALCIUM 8.9 10.1  MG -- --  PHOS -- --   No results found for this basename: INR:5 in the last 168 hours  No results found for this basename: PHART:5,PCO2:5,PCO2ART:5,PO2:5,PO2ART:5,HCO3:5,TCO2:5,O2SAT:5 in the last 168 hours  Radiology: See Above  Assessment and Plan: 66 year old female with right sided osteomylitis with developing septic shock.  Sepsis secondary to osteomyelitis of  right foot  Assessment: Developing shock. Plan:  Septic shock protocol.  Pan culture.  Vanc and zosyn.  Vascular surgery.  Will likely need amputation.  DM Assessment:  Plan: ISS.  Hx of HTN / CHF Assessment: Now hypotensive due to shock. Plan:  See above.  Hold all anti-HTN.  Conus medullaris syndrome Assessment:  Plan: Per primary.  Hx of Gout: monitor.  Canary Brim, NP-C [facility] Pgr: [phone]  02/03/2012, 5:24 PM  Patient in septic shock due to cellulitis and osteomylitis from right leg as mentioned above.  UOP dropping but renal function intact for now.  Will repeat labs.  Will start sepsis protocol.  Patient evidently carries the diagnosis of CHF.  Will order echo and be more conscious of fluid use.  Lactic acid level pending.  CC time 60 min.  Patient seen and examined, agree with above note.  I dictated the care and orders written for this patient under my direction.  Koren Bound, M.D. [phone]

## 2012-02-03 NOTE — Progress Notes (Signed)
Called to bedside to assist with patient with low bp.  On arrival patient supine in bed - MD's present - getting NS fluid bolus. Patient arouses to name - answers questions appropriately.  Skin cool and dry.  Resps reg and unlabored.  bil Bs present coarse.  Foley patent - cloudy yellow urine.  Right foot with moderate size l area open wound on medial foot area- heel with large eschar - patient denies pain in foot at present.  NS infusing right PIV at 999cc/hr.  Vascular lab tech leaving room- just completed doppler studies -  Bp in LEFT arm now reading 113/51 with RIGHT arm reading 70/40 - patient states she has difference in BP arms at MD's office - not sure what difference is  HR 78 - SR.  RR 20 - O2 sats 100%.  . BP responding to fluids at present.  Dr. Mauricio Po and Dr. Berline Chough present.  Dr. Molli Knock in to consult.  Patient transferred to 2110 - handoff report to [facility], [place].

## 2012-02-03 NOTE — Progress Notes (Signed)
Clinical Child psychotherapist (CSW) received a referral for advanced directives however upon entering the room the pt stated she did not want any visitors. CSW was asked to return tomorrow when the pt was feeling better. CSW will follow up tomorrow.  Theresia Bough, MSW, Theresia Majors [phone]

## 2012-02-03 NOTE — Progress Notes (Signed)
This visit was in response to a Spiritual Care Consult:  I introduced myself and chaplain services to pt.  Pt looked very drowsy so  I asked pt if this was a good time to visit.  She stated that she was not up to a visit and requested that I return tomorrow.  I plan to follow-up.  Please page me if needed or requested. Dellie Catholic  [phone] personal pager   [phone] oncall pager

## 2012-02-03 NOTE — Progress Notes (Signed)
Pt is on sepsis protocol. Co-oximetry of 61 called to [facility]. Hgb is 10.6. Will continue to monitor.

## 2012-02-04 ENCOUNTER — Inpatient Hospital Stay (HOSPITAL_COMMUNITY): Payer: Medicare Other

## 2012-02-04 DIAGNOSIS — I059 Rheumatic mitral valve disease, unspecified: Secondary | ICD-10-CM

## 2012-02-04 LAB — CARDIAC PANEL(CRET KIN+CKTOT+MB+TROPI)
CK, MB: 3.8 ng/mL (ref 0.3–4.0)
CK, MB: 4.4 ng/mL — ABNORMAL HIGH (ref 0.3–4.0)
Relative Index: 1.5 (ref 0.0–2.5)
Total CK: 343 U/L — ABNORMAL HIGH (ref 7–177)
Troponin I: <0.3 ng/mL (ref <0.30)
Troponin I: <0.3 ng/mL (ref <0.30)

## 2012-02-04 LAB — BASIC METABOLIC PANEL
BUN: 6 mg/dL (ref 6–23)
CO2: 24 mEq/L (ref 19–32)
Glucose, Bld: 156 mg/dL — ABNORMAL HIGH (ref 70–99)
Potassium: 3.6 mEq/L (ref 3.5–5.1)
Sodium: 134 mEq/L — ABNORMAL LOW (ref 135–145)

## 2012-02-04 LAB — CBC
HCT: 28.3 % — ABNORMAL LOW (ref 36.0–46.0)
Hemoglobin: 9.4 g/dL — ABNORMAL LOW (ref 12.0–15.0)
RBC: 3.47 MIL/uL — ABNORMAL LOW (ref 3.87–5.11)

## 2012-02-04 LAB — GLUCOSE, CAPILLARY: Glucose-Capillary: 128 mg/dL — ABNORMAL HIGH (ref 70–99)

## 2012-02-04 LAB — PHOSPHORUS: Phosphorus: 3.3 mg/dL (ref 2.3–4.6)

## 2012-02-04 MED ORDER — MUPIROCIN CALCIUM 2 % EX CREA
TOPICAL_CREAM | Freq: Every day | CUTANEOUS | Status: DC
  Administered 2012-02-04 – 2012-02-05 (×2): via TOPICAL
  Filled 2012-02-04: qty 15

## 2012-02-04 MED ORDER — WHITE PETROLATUM GEL
Status: AC
  Administered 2012-02-04: 11:00:00
  Filled 2012-02-04: qty 5

## 2012-02-04 NOTE — Progress Notes (Signed)
Name: Alexandra Wiley MRN: [id number] DOB: [birth year]    LOS: 2  [place] [facility] / Critical Care Note   History of Present Illness:  66 y/o BF, smoker with PMH of HTN, Gout, PVD, CHF, DM on insulin, spinal cord infarct (?walks with walker, neurogenic bladder) admitted on 5/14 by FPTS with a 3 week history of ulceration on R medial metatarsophalangeal joint area and heel.   Episode of asymptomatic hypotension 5/15 prompted transfer to ICU and concern for sepsis.    Lines / Drains: IJ TLC 5/15>>>  Cultures: 5/15 BCx2>>> 5/15 Sputum>>> 5/15 UA>>> 5/15 UC>>>  Antibiotics: 5/14 Vanco (osteo)>>> 5/14 Zosyn (oseto)>>>  Tests / Events: 5/14 Foot XRAY>>>Significant soft tissue swelling diffusely in right foot with osseous demineralization. Suspected area of bone destruction at the posterior margin of the calcaneus deep to the known wound suspicious for osteomyelitis. If further imaging is required recommend MR imaging with and without contrast. 5/14 R FOOT MRI>>>Posterior calcaneal osteomyelitis with adjacent cellulitis.  Suspected low-level fasciitis tracking along the margins of the plantar fascia. Dorsal subcutaneous edema in the foot probably represents cellulitis given the low-level associated enhancement.  No drainable abscess observed  SUBJ:  Pleasant, no distress, oriented, no new complaints  Vital Signs: Temp:  [98 F (36.7 C)-99 F (37.2 C)] 98.5 F (36.9 C) (05/16 1600) Pulse Rate:  [65-100] 70  (05/16 1500) Resp:  [13-23] 14  (05/16 1500) BP: (93-148)/(37-82) 99/47 mmHg (05/16 1500) SpO2:  [94 %-100 %] 100 % (05/16 1500) FiO2 (%):  [2 %] 2 % (05/16 1200) Weight:  [78.8 kg (173 lb 11.6 oz)] 78.8 kg (173 lb 11.6 oz) (05/16 0500) I/O last 3 completed shifts: In: 3105.7 [P.O.:720; I.V.:2135.7; IV Piggyback:250] Out: 2275 [Urine:2275]  Physical Examination: General:chronically ill Neuro: AAOx4, speech clear, MAE CV: s1s2 rrr PULM: resp's even/non-labored, lungs  bilaterally coarse GI: round/soft, bsx4 active Extremities: cool/dry, chronic LE venous changes, RLE:  Quarter sized, round opening medial aspect of foot, heel with large eschar cap  Labs    CBC  Lab 02/04/12 0400 02/03/12 1825 02/03/12 0540  HGB 9.4* 10.6* 11.2*  HCT 28.3* 31.9* 33.8*  WBC 15.3* 15.1* 13.7*  PLT 398 444* 446*   BMET  Lab 02/04/12 0400 02/03/12 1825 02/03/12 0540 02/02/12 1539  NA 134* 138 137 135  K 3.6 3.7 -- --  CL 102 104 101 96  CO2 24 26 26 25   GLUCOSE 156* 107* 161* 140*  BUN 6 9 13 19   CREATININE 0.62 0.77 0.82 0.81  CALCIUM 8.1* 8.8 8.9 10.1  MG 1.5 1.6 -- --  PHOS 3.3 3.5 -- --    Lab 02/03/12 1825  INR 1.16     Lab 02/03/12 2130 02/03/12 1910  PHART -- --  PCO2ART -- --  PO2ART -- --  HCO3 -- --  TCO2 -- --  O2SAT 71.5 61.0    Radiology: No new CXR  Assessment and Plan: 66 year old female with right sided osteomylitis with resolving septic shock.  Sepsis secondary to osteomyelitis of right foot  Assessment: Shock resolved. EGDT completed 5/15 Plan: F/U cx results  Cont Vanc and zosyn.  Vascular surgery has recommended amputation   DM Assessment:  Plan: Cont SSI - ACHS.  Hx of HTN / CHF Assessment: Hypotension resolved Plan:  Cont to hold all anti-HTN.  Conus medullaris syndrome Assessment:  Plan: Per primary.  Hx of Gout: monitor.    Billy Fischer, MD;  PCCM service; [place] [phone]

## 2012-02-04 NOTE — Progress Notes (Signed)
Family Medicine Teaching Service, brief note Stopped by to touch base about patient's decision for surgery. Reiterated the importance to get surgery in order to avoid complications from osteomyelitis. Asked patient what was her fear about getting surgery and she expressed being afraid that with an amputation, she will not be able to be mobile and get around. I explained that she would likely not be able to walk around with her foot in its current state and that with intensive rehab she could potentially regain a certain amount of mobility after an amputation. Patient expressed feeling alone. She thought that a chaplain consult would be helpful. Her husband was at bedside and he is very much in favor of the operation.   Patient did not come to final decision but said that she will think about it.   Marena Chancy, PGY-1  Family Medicine Teaching Service [phone]

## 2012-02-04 NOTE — Clinical Documentation Improvement (Signed)
CHF DOCUMENTATION CLARIFICATION QUERY  THIS DOCUMENT IS NOT A PERMANENT PART OF THE MEDICAL RECORD  TO RESPOND TO THE THIS QUERY, FOLLOW THE INSTRUCTIONS BELOW:  1. If needed, update documentation for the patient's encounter via the notes activity.  2. Access this query again and click edit on the In [employer].  3. After updating, or not, click F2 to complete all highlighted (required) fields concerning your review. Select "additional documentation in the medical record" OR "no additional documentation provided".  4. Click Sign note button.  5. The deficiency will fall out of your In Basket *Please let us know if you are not able to complete this workflow by phone or e-mail (listed below).  Please update your documentation within the medical record to reflect your response to this query.                                                                                    02/04/12  Dear Dr. [facility],  In a better effort to capture your patient's severity of illness, reflect appropriate length of stay and utilization of resources, a review of the patient medical record has revealed the following indicators the diagnosis of Heart Failure.    Based on your clinical judgment, please clarify and document in a progress note and/or discharge summary the clinical condition associated with the following supporting information:  In responding to this query please exercise your independent judgment.  The fact that a query is asked, does not imply that any particular answer is desired or expected.  Possible Clinical Conditions?  Chronic Systolic Congestive Heart Failure Chronic Diastolic Congestive Heart Failure Chronic Systolic & Diastolic Congestive Heart Failure Acute Systolic Congestive Heart Failure Acute Diastolic Congestive Heart Failure Acute Systolic & Diastolic Congestive Heart Failure Acute on Chronic Systolic Congestive Heart Failure Acute on Chronic Diastolic Congestive  Heart Failure Acute on Chronic Systolic & Diastolic  Congestive Heart Failure Other Condition________________________________________ Cannot Clinically Determine  Supporting Information:  Risk Factors:(As per H&P and PN) "# h/o CHF":  - obtain daily weights               - strict I's and O's             - f/u echo    Signs & Symptoms: (As per H&P and PN) "Does have some evidence of mild fluid overload on exam"  Diagnostics: Echocardiogram ordered on 5-15 but no tcompleted at this point.  Treatment:(As per H&P and PN)  - obtain daily weight  - strict I's and O 's - f/u echo    Reviewed:  no additional documentation provided  Thank You,  Joanette Gula Delk RN,BSN Clinical Documentation Specialist: [phone] Pager Health Information Management [place]

## 2012-02-04 NOTE — Progress Notes (Signed)
Attempted to have pt sign consent for surgery; pt stated "I haven't decided on that yet".

## 2012-02-04 NOTE — Consult Note (Addendum)
WOC consult Note Reason for Consult: wound R heel, R 1st metatarsal medial joint Wound type: both full thickness of unknown etiology, pt has diminished sensation d/t hx of stroke and secondary parapalegia Pressure Ulcer POA: no ( This is not consistent with a pressure ulcer) Measurement: R heel 6cm x 3cm x 0.1cm, R 1st metatarsal medial 1.5cm x 1cm x 0.3cm Wound bed: R heel pink 50%, eschar 50%; R 1st metatarsal medial eschar Drainage (amount, consistency, odor); no drainage but strong foul odor from heel wound noted Periwound: R heel dry, peeling; R 1st metatarsal medial thick callous Dressing procedure/placement/frequency: bactroban to R 1st metatarsal medial; heel left OTA. Await ortho/vascular consult to write orders for further POC for R heel.  MRI results reveal presence of osteomyelitis which is beyond [facility] scope of practice - will not plan to follow. ABI results pending; Vascular/ ortho consult is pending after ABI results, according to progress notes.  Vesta Mixer RN BSN, wound care student Will not plan to follow further unless re-consulted.  [address], RN, MSN, [place] Minion  [phone] /

## 2012-02-04 NOTE — Progress Notes (Signed)
  Echocardiogram 2D Echocardiogram has been performed.  Alexandra Wiley L 02/04/2012, 3:02 PM

## 2012-02-04 NOTE — Progress Notes (Signed)
Subjective: Interval History: . The patient had an episode of hypotension last evening and was transferred to the medical intensive care unit. Fortunately she responded to body and resuscitation and is normotensive at this morning. She reports moderate discomfort from her right foot. Objective: Vital signs in last 24 hours: Temp:  [97.9 F (36.6 C)-99 F (37.2 C)] 98.1 F (36.7 C) (05/16 0803) Pulse Rate:  [42-100] 82  (05/16 0700) Resp:  [14-23] 19  (05/16 0700) BP: (61-148)/(37-82) 139/45 mmHg (05/16 0800) SpO2:  [94 %-100 %] 98 % (05/16 0700) Weight:  [173 lb 11.6 oz (78.8 kg)] 173 lb 11.6 oz (78.8 kg) (05/16 0500)  Intake/Output from previous day: 05/15 0701 - 05/16 0700 In: 2625.7 [P.O.:240; I.V.:2135.7; IV Piggyback:250] Out: 1025 [Urine:1025] Intake/Output this shift: Total I/O In: 3.1 [I.V.:3.1] Out: 90 [Urine:90]  Extremities: No change in exam. She does have a palpable right femoral pulse. Her thigh and calf are warm and she has no calf tenderness. She has a foul-smelling gangrenous right foot with full-thickness loss in the large area over her heel, her fifth toe, and her medial metatarsal head  Lab Results:  Basename 02/04/12 0400 02/03/12 1825  WBC 15.3* 15.1*  HGB 9.4* 10.6*  HCT 28.3* 31.9*  PLT 398 444*   BMET  Basename 02/04/12 0400 02/03/12 1825  NA 134* 138  K 3.6 3.7  CL 102 104  CO2 24 26  GLUCOSE 156* 107*  BUN 6 9  CREATININE 0.62 0.77  CALCIUM 8.1* 8.8    Studies/Results: Mr Foot Right W Wo Contrast  02/02/2012  *RADIOLOGY REPORT*  Clinical Data: Heel wound with possible osteomyelitis shown on radiography.  MRI OF THE RIGHT FOREFOOT WITHOUT AND WITH CONTRAST  Technique:  Multiplanar, multisequence MR imaging was performed both before and after administration of intravenous contrast.  Contrast: 15mL MULTIHANCE GADOBENATE DIMEGLUMINE 529 MG/ML IV SOLN  Comparison: 02/02/2012  Findings: Abnormal osseous edema and enhancement noted posteriorly in  the calcaneus adjacent to the Achilles insertion site, with adjacent subcutaneous edema.  The appearance is compatible with calcaneal osteomyelitis and adjacent cellulitis.  No abscess is observed.  There is extensive abnormal subcutaneous edema laterally and along the dorsum of the foot, which not entirely included on today's ankle examination.  No findings of osteomyelitis involving the distal tibia, distal fibula, talus, midfoot, or metatarsal bases.  Low-level edema and enhancement noted tracking deep to the plantar fascia, without drainable abscess observed.  Nonstandard angulation was utilized to best evaluate the calcaneus, and ligamentous assessment of the ankle is problematic, although no obvious lateral ligamentous complex or deltoid ligament discontinuity is observed, and the flexor tendons of the ankle appear grossly intact. Spring ligament appears intact.  IMPRESSION:  1.  Posterior calcaneal osteomyelitis with adjacent cellulitis. 2.  Suspected low-level fasciitis tracking along the margins of the plantar fascia. 3.  Dorsal subcutaneous edema in the foot probably represents cellulitis given the low-level associated enhancement. 4.  No drainable abscess observed.  Original Report Authenticated By: Dellia Cloud, M.D.   Dg Chest Port 1 View  02/04/2012  *RADIOLOGY REPORT*  Clinical Data: Respiratory failure, shortness of breath  PORTABLE CHEST - 1 VIEW  Comparison: 02/03/2012  Findings: Cardiomegaly again noted.  Stable right IJ central line position.  Persistent mild congestion/edema.  Probable small left pleural effusion.  Bilateral basilar atelectasis or infiltrate.  IMPRESSION:  Persistent mild congestion/edema.  Probable small left pleural effusion.  Bilateral basilar atelectasis or infiltrate.  Original Report Authenticated By: Natasha Mead, M.D.  Dg Chest Portable 1 View  02/03/2012  *RADIOLOGY REPORT*  Clinical Data: Central line placement.  PORTABLE CHEST - 1 VIEW  Comparison:  11/18/2008.  Findings: Right IJ central line tip projects over the SVC.  No pneumothorax.  Trachea is midline.  Heart is enlarged, stable. There is mild diffuse bilateral air space disease.  No definite pleural effusions.  IMPRESSION:  1.  Right IJ central line placement without pneumothorax. 2.  Pulmonary edema.  Original Report Authenticated By: Reyes Ivan, M.D.   Dg Foot Complete Right  02/02/2012  *RADIOLOGY REPORT*  Clinical Data: Wound at heel question osteomyelitis, history diabetes, fell 2 weeks ago  RIGHT FOOT COMPLETE - 3+ VIEW  Comparison: None.  Findings: Diffuse soft tissue swelling. Osseous demineralization. Joint spaces preserved. Soft tissue irregularity and dressing artifacts dorsal to the calcaneus and Achilles insertion. Suspicious area of potential bone destruction is seen at the posterior margin of the calcaneus suspicious for osteomyelitis. Small plantar calcaneal spur. No additional fracture, dislocation or bone destruction seen.  IMPRESSION: Significant soft tissue swelling diffusely in right foot with osseous demineralization. Suspected area of bone destruction at the posterior margin of the calcaneus deep to the known wound suspicious for osteomyelitis. If further imaging is required recommend MR imaging with and without contrast.  Original Report Authenticated By: Lollie Marrow, M.D.   Anti-infectives: Anti-infectives     Start     Dose/Rate Route Frequency Ordered Stop   02/03/12 2000   vancomycin (VANCOCIN) IVPB 1000 mg/200 mL premix        1,000 mg 200 mL/hr over 60 Minutes Intravenous Every 12 hours 02/03/12 0919     02/03/12 1730   piperacillin-tazobactam (ZOSYN) IVPB 3.375 g  Status:  Discontinued        3.375 g 100 mL/hr over 30 Minutes Intravenous  Once 02/03/12 1723 02/03/12 1725   02/03/12 1730   vancomycin (VANCOCIN) IVPB 1000 mg/200 mL premix  Status:  Discontinued        1,000 mg 200 mL/hr over 60 Minutes Intravenous  Once 02/03/12 1723 02/03/12 1725     02/03/12 0100   piperacillin-tazobactam (ZOSYN) IVPB 3.375 g        3.375 g 12.5 mL/hr over 240 Minutes Intravenous Every 8 hours 02/02/12 2233     02/02/12 1700   vancomycin (VANCOCIN) 1,250 mg in sodium chloride 0.9 % 250 mL IVPB  Status:  Discontinued        1,250 mg 166.7 mL/hr over 90 Minutes Intravenous Every 12 hours 02/02/12 1613 02/03/12 0919   02/02/12 1545   piperacillin-tazobactam (ZOSYN) 3.375 g in dextrose 5 % 50 mL IVPB        3.375 g 100 mL/hr over 30 Minutes Intravenous  Once 02/02/12 1530 02/02/12 1937          Assessment/Plan: s/p Procedure(s) (LRB): AMPUTATION BELOW KNEE (Right) Tenderness right foot, not salvageable. I did discuss the need for amputation with the patient. Clinically I feel she is a good chance being able to heal a below-knee amputation would recommend this. The patient is currently refusing to consider amputation. I do not feel that she is septic from her foot and therefore would not insist on this. I explained that she will eventually become that way. She will discuss this with her family and notify us if she wishes to proceed. She is scheduled for surgery tomorrow if she consents for a low knee versus above-knee amputation  LOS: 2 days   Hadley Detloff 02/04/2012, 8:16  AM    

## 2012-02-04 NOTE — Progress Notes (Signed)
Family Medicine Teaching Service Daily Progress Note: [id number] Subjective: Episode of hypotension yesterday to the 60's systolic. Patient lethargic but answering questions appropriately. See event note. Was given IV fluids and was transferred to the ICU for concern for sepsis.  Overnight has done well. Blood pressures have improved. There appeared to be a discrepancy in blood pressures between the two arms, with the right arm measuring low compared to the left.   Objective: Vital signs in last 24 hours: Temp:  [98 F (36.7 C)-99 F (37.2 C)] 98.1 F (36.7 C) (05/16 0803) Pulse Rate:  [42-100] 86  (05/16 1100) Resp:  [13-23] 23  (05/16 1100) BP: (61-148)/(37-82) 132/50 mmHg (05/16 1100) SpO2:  [94 %-100 %] 100 % (05/16 1100) FiO2 (%):  [2 %] 2 % (05/16 1200) Weight:  [173 lb 11.6 oz (78.8 kg)] 173 lb 11.6 oz (78.8 kg) (05/16 0500) Weight change: -6 lb 2.8 oz (-2.8 kg) Last BM Date: 02/01/12  Intake/Output from previous day: 05/15 0701 - 05/16 0700 In: 2625.7 [P.O.:240; I.V.:2135.7; IV Piggyback:250] Out: 1025 [Urine:1025] Intake/Output this shift: Total I/O In: 649.9 [P.O.:150; I.V.:262.4; IV Piggyback:237.5] Out: 390 [Urine:390] Constitutional: She is oriented to person, place, and time. Obese, no acute distress in bed, eating breakfast.  Cardiovascular: Normal rate and regular rhythm.  3/6 systolic murmur best heard at the right sternal border  Respiratory: crackles in lower bases GI: Soft. She exhibits no distension. There is no tenderness. There is no guarding.  Neurological: She is alert and oriented to person, place, and time.  Right foot: ulcer with granulation tissue on the medial side of the great toe with necrotic tissue surrounding it.  Heel: necrotic tissue at base of heel: ~4cm in diameter. Pinkish skin around necrotic area. Odor present from foot. Dressing on dorsum of foot.  Erythema and warmth present. Pulses difficult to palpate. +3 pitting edema in left foot. 2+  in right.   Lab Results:  Basename 02/04/12 0400 02/03/12 1825  WBC 15.3* 15.1*  HGB 9.4* 10.6*  HCT 28.3* 31.9*  PLT 398 444*   BMET  Basename 02/04/12 0400 02/03/12 1825  NA 134* 138  K 3.6 3.7  CL 102 104  CO2 24 26  GLUCOSE 156* 107*  BUN 6 9  CREATININE 0.62 0.77  CALCIUM 8.1* 8.8    Studies/Results: Mr Foot Right W Wo Contrast  02/02/2012  *RADIOLOGY REPORT*  Clinical Data: Heel wound with possible osteomyelitis shown on radiography.  MRI OF THE RIGHT FOREFOOT WITHOUT AND WITH CONTRAST  Technique:  Multiplanar, multisequence MR imaging was performed both before and after administration of intravenous contrast.  Contrast: 15mL MULTIHANCE GADOBENATE DIMEGLUMINE 529 MG/ML IV SOLN  Comparison: 02/02/2012  Findings: Abnormal osseous edema and enhancement noted posteriorly in the calcaneus adjacent to the Achilles insertion site, with adjacent subcutaneous edema.  The appearance is compatible with calcaneal osteomyelitis and adjacent cellulitis.  No abscess is observed.  There is extensive abnormal subcutaneous edema laterally and along the dorsum of the foot, which not entirely included on today's ankle examination.  No findings of osteomyelitis involving the distal tibia, distal fibula, talus, midfoot, or metatarsal bases.  Low-level edema and enhancement noted tracking deep to the plantar fascia, without drainable abscess observed.  Nonstandard angulation was utilized to best evaluate the calcaneus, and ligamentous assessment of the ankle is problematic, although no obvious lateral ligamentous complex or deltoid ligament discontinuity is observed, and the flexor tendons of the ankle appear grossly intact. Spring ligament appears intact.  IMPRESSION:  1.  Posterior calcaneal osteomyelitis with adjacent cellulitis. 2.  Suspected low-level fasciitis tracking along the margins of the plantar fascia. 3.  Dorsal subcutaneous edema in the foot probably represents cellulitis given the low-level  associated enhancement. 4.  No drainable abscess observed.  Original Report Authenticated By: Dellia Cloud, M.D.   Dg Chest Port 1 View  02/04/2012  *RADIOLOGY REPORT*  Clinical Data: Respiratory failure, shortness of breath  PORTABLE CHEST - 1 VIEW  Comparison: 02/03/2012  Findings: Cardiomegaly again noted.  Stable right IJ central line position.  Persistent mild congestion/edema.  Probable small left pleural effusion.  Bilateral basilar atelectasis or infiltrate.  IMPRESSION:  Persistent mild congestion/edema.  Probable small left pleural effusion.  Bilateral basilar atelectasis or infiltrate.  Original Report Authenticated By: Natasha Mead, M.D.   Dg Chest Portable 1 View  02/03/2012  *RADIOLOGY REPORT*  Clinical Data: Central line placement.  PORTABLE CHEST - 1 VIEW  Comparison: 11/18/2008.  Findings: Right IJ central line tip projects over the SVC.  No pneumothorax.  Trachea is midline.  Heart is enlarged, stable. There is mild diffuse bilateral air space disease.  No definite pleural effusions.  IMPRESSION:  1.  Right IJ central line placement without pneumothorax. 2.  Pulmonary edema.  Original Report Authenticated By: Reyes Ivan, M.D.   Dg Foot Complete Right  02/02/2012  *RADIOLOGY REPORT*  Clinical Data: Wound at heel question osteomyelitis, history diabetes, fell 2 weeks ago  RIGHT FOOT COMPLETE - 3+ VIEW  Comparison: None.  Findings: Diffuse soft tissue swelling. Osseous demineralization. Joint spaces preserved. Soft tissue irregularity and dressing artifacts dorsal to the calcaneus and Achilles insertion. Suspicious area of potential bone destruction is seen at the posterior margin of the calcaneus suspicious for osteomyelitis. Small plantar calcaneal spur. No additional fracture, dislocation or bone destruction seen.  IMPRESSION: Significant soft tissue swelling diffusely in right foot with osseous demineralization. Suspected area of bone destruction at the posterior margin of the  calcaneus deep to the known wound suspicious for osteomyelitis. If further imaging is required recommend MR imaging with and without contrast.  Original Report Authenticated By: Lollie Marrow, M.D.    Medications:  Scheduled:    . allopurinol  100 mg Oral Daily  . antiseptic oral rinse  15 mL Mouth Rinse BID  . atorvastatin  10 mg Oral Daily  . baclofen  10 mg Oral BID  . gabapentin  300 mg Oral QID  . heparin  5,000 Units Subcutaneous Q8H  . insulin aspart  0-15 Units Subcutaneous TID WC  . insulin aspart protamine-insulin aspart  12 Units Subcutaneous Q supper  . levothyroxine  75 mcg Oral Daily  . methocarbamol  500 mg Oral QID  . mupirocin cream   Topical Daily  . oxybutynin  10 mg Oral Daily  . pantoprazole  40 mg Oral Q1200  . piperacillin-tazobactam (ZOSYN)  IV  3.375 g Intravenous Q8H  . sodium chloride  25 mL/kg Intravenous Once  . sodium chloride  3 mL Intravenous Q12H  . vancomycin  1,000 mg Intravenous Q12H  . white petrolatum      . DISCONTD: metoprolol tartrate  50 mg Oral Daily  . DISCONTD: piperacillin-tazobactam  3.375 g Intravenous Once  . 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  Currently on CCM service due to concern for sepsis.   # sepsis in the setting of left posterior calcaneal osteomyelitis: on sepsis protocol. Lactic acid and procalcitonin are normal.  - f/u blood culture - continue vanc and zosyn (start 05/14) for osteomyelitis - surgery recommends amputation of left foot but patient is not agreeable to this. Did explain to the patient the complications of letting infection spread. Will come and readdress this in the afternoon. Surgery tentatively  scheduled for tomorrow.  - dobutamine drip for low bp  # Diabetes: on NPH 70/30 12 u bid at home. She reports CBG's in the 200 range. A1C of 10.2. CBG's between 109 and 148 - continue NPH 70/30 12 u qhs  - sliding scale coverage  # h/o spinal cord infarct:  - continue home meds for spasms and pain: baclofen 10bid, gabapentin 300 qid, methocarbamol  - unclear why she is on oxybutinin since she has urinary retention. Will d/c nitrofurantoi for now since on vanc and zosyn.  # h/o CHF:  - obtain daily weights  - strict I's and O's - f/u echo  # h/o gout:  Resume home allopurinol  - check uric acid # h/o HTN:  - BP meds held in the setting of hypotension - on dobutamine drip #h/o hypothyroid:  - continue home levothyroxine  - f/u TSH PPx:  - heparin sub q  Fen/Gi: npo Dispo: currently on CCM's service. Greatly appreciate CCM's care. Will continue to follow.    LOS: 2 days   Toneka Fullen 02/04/2012, 12:03 PM

## 2012-02-05 ENCOUNTER — Encounter (HOSPITAL_COMMUNITY): Payer: Self-pay | Admitting: Certified Registered Nurse Anesthetist

## 2012-02-05 ENCOUNTER — Encounter (HOSPITAL_COMMUNITY): Payer: Self-pay | Admitting: Anesthesiology

## 2012-02-05 ENCOUNTER — Inpatient Hospital Stay (HOSPITAL_COMMUNITY): Payer: Medicare Other

## 2012-02-05 ENCOUNTER — Inpatient Hospital Stay (HOSPITAL_COMMUNITY): Payer: Medicare Other | Admitting: Anesthesiology

## 2012-02-05 ENCOUNTER — Encounter (HOSPITAL_COMMUNITY)
Admission: EM | Disposition: A | Discharge status: Home / Self Care | Payer: Self-pay | Source: Home / Self Care | Attending: Family Medicine

## 2012-02-05 DIAGNOSIS — K592 Neurogenic bowel, not elsewhere classified: Secondary | ICD-10-CM

## 2012-02-05 DIAGNOSIS — N319 Neuromuscular dysfunction of bladder, unspecified: Secondary | ICD-10-CM

## 2012-02-05 HISTORY — PX: AMPUTATION: SHX166

## 2012-02-05 LAB — PROTIME-INR
INR: 1.11 (ref 0.00–1.49)
Prothrombin Time: 14.5 seconds (ref 11.6–15.2)

## 2012-02-05 LAB — COMPREHENSIVE METABOLIC PANEL
Albumin: 2.5 g/dL — ABNORMAL LOW (ref 3.5–5.2)
BUN: 4 mg/dL — ABNORMAL LOW (ref 6–23)
Creatinine, Ser: 0.72 mg/dL (ref 0.50–1.10)
GFR calc Af Amer: >90 mL/min (ref >90)
Total Bilirubin: 0.2 mg/dL — ABNORMAL LOW (ref 0.3–1.2)
Total Protein: 5.8 g/dL — ABNORMAL LOW (ref 6.0–8.3)

## 2012-02-05 LAB — URINE CULTURE: Colony Count: NO GROWTH

## 2012-02-05 LAB — GLUCOSE, CAPILLARY
Glucose-Capillary: 136 mg/dL — ABNORMAL HIGH (ref 70–99)
Glucose-Capillary: 100 mg/dL — ABNORMAL HIGH (ref 70–99)
Glucose-Capillary: 93 mg/dL (ref 70–99)

## 2012-02-05 LAB — CBC
HCT: 28.9 % — ABNORMAL LOW (ref 36.0–46.0)
MCH: 27 pg (ref 26.0–34.0)
MCHC: 32.9 g/dL (ref 30.0–36.0)
MCV: 82.1 fL (ref 78.0–100.0)
RDW: 14.8 % (ref 11.5–15.5)

## 2012-02-05 LAB — VANCOMYCIN, TROUGH: Vancomycin Tr: 15.8 ug/mL (ref 10.0–20.0)

## 2012-02-05 SURGERY — AMPUTATION BELOW KNEE
Anesthesia: General | Site: Leg Lower | Laterality: Right | Wound class: Clean

## 2012-02-05 MED ORDER — LACTATED RINGERS IV SOLN
INTRAVENOUS | Status: DC
  Administered 2012-02-05: 50 mL/h via INTRAVENOUS

## 2012-02-05 MED ORDER — LIDOCAINE HCL (CARDIAC) 20 MG/ML IV SOLN
INTRAVENOUS | Status: DC | PRN
  Administered 2012-02-05: 60 mg via INTRAVENOUS

## 2012-02-05 MED ORDER — PHENYLEPHRINE HCL 10 MG/ML IJ SOLN
INTRAMUSCULAR | Status: DC | PRN
  Administered 2012-02-05: 40 ug via INTRAVENOUS
  Administered 2012-02-05 (×3): 80 ug via INTRAVENOUS
  Administered 2012-02-05: 40 ug via INTRAVENOUS
  Administered 2012-02-05: 80 ug via INTRAVENOUS

## 2012-02-05 MED ORDER — PROPOFOL 10 MG/ML IV EMUL
INTRAVENOUS | Status: DC | PRN
  Administered 2012-02-05: 130 mg via INTRAVENOUS

## 2012-02-05 MED ORDER — ONDANSETRON HCL 4 MG/2ML IJ SOLN: 4.0000 mg | Freq: Once | INTRAMUSCULAR | Status: AC | PRN

## 2012-02-05 MED ORDER — ONDANSETRON HCL 4 MG/2ML IJ SOLN
INTRAMUSCULAR | Status: DC | PRN
  Administered 2012-02-05: 4 mg via INTRAVENOUS

## 2012-02-05 MED ORDER — HYDROMORPHONE HCL PF 1 MG/ML IJ SOLN
0.2500 mg | INTRAMUSCULAR | Status: DC | PRN
  Administered 2012-02-05 (×2): 0.5 mg via INTRAVENOUS

## 2012-02-05 MED ORDER — EPHEDRINE SULFATE 50 MG/ML IJ SOLN
INTRAMUSCULAR | Status: DC | PRN
  Administered 2012-02-05: 5 mg via INTRAVENOUS
  Administered 2012-02-05: 10 mg via INTRAVENOUS
  Administered 2012-02-05: 5 mg via INTRAVENOUS

## 2012-02-05 MED ORDER — LACTATED RINGERS IV SOLN
INTRAVENOUS | Status: DC | PRN
  Administered 2012-02-05 (×2): via INTRAVENOUS

## 2012-02-05 MED ORDER — PHENYLEPHRINE HCL 10 MG/ML IJ SOLN
10.0000 mg | INTRAVENOUS | Status: DC | PRN
  Administered 2012-02-05: 25 ug/min via INTRAVENOUS

## 2012-02-05 MED ORDER — FENTANYL CITRATE 0.05 MG/ML IJ SOLN: 50.0000 ug | INTRAMUSCULAR | Status: DC | PRN

## 2012-02-05 MED ORDER — FENTANYL CITRATE 0.05 MG/ML IJ SOLN
INTRAMUSCULAR | Status: DC | PRN
  Administered 2012-02-05: 100 ug via INTRAVENOUS
  Administered 2012-02-05: 50 ug via INTRAVENOUS

## 2012-02-05 MED ORDER — MIDAZOLAM HCL 2 MG/2ML IJ SOLN: 1.0000 mg | INTRAMUSCULAR | Status: DC | PRN

## 2012-02-05 MED ORDER — MORPHINE SULFATE 2 MG/ML IJ SOLN
2.0000 mg | INTRAMUSCULAR | Status: DC | PRN
  Administered 2012-02-05: 2 mg via INTRAVENOUS
  Administered 2012-02-05: 4 mg via INTRAVENOUS
  Filled 2012-02-05: qty 2
  Filled 2012-02-05: qty 1

## 2012-02-05 MED ORDER — MORPHINE SULFATE 2 MG/ML IJ SOLN
1.0000 mg | INTRAMUSCULAR | Status: DC | PRN
  Administered 2012-02-05 (×3): 2 mg via INTRAVENOUS
  Filled 2012-02-05 (×3): qty 1

## 2012-02-05 MED ORDER — SODIUM CHLORIDE 0.9 % IV SOLN: INTRAVENOUS | Status: DC | PRN

## 2012-02-05 MED ORDER — 0.9 % SODIUM CHLORIDE (POUR BTL) OPTIME
TOPICAL | Status: DC | PRN
  Administered 2012-02-05: 1000 mL

## 2012-02-05 MED ORDER — HETASTARCH-ELECTROLYTES 6 % IV SOLN
INTRAVENOUS | Status: AC
  Filled 2012-02-05: qty 500

## 2012-02-05 MED ORDER — HETASTARCH-ELECTROLYTES 6 % IV SOLN
500.0000 mL | Freq: Once | INTRAVENOUS | Status: AC
  Administered 2012-02-05: 500 mL via INTRAVENOUS

## 2012-02-05 MED ORDER — MIDAZOLAM HCL 5 MG/5ML IJ SOLN
INTRAMUSCULAR | Status: DC | PRN
  Administered 2012-02-05: 2 mg via INTRAVENOUS

## 2012-02-05 SURGICAL SUPPLY — 46 items
BANDAGE ELASTIC 4 VELCRO ST LF (GAUZE/BANDAGES/DRESSINGS) ×2 IMPLANT
BANDAGE ELASTIC 6 VELCRO ST LF (GAUZE/BANDAGES/DRESSINGS) ×2 IMPLANT
BANDAGE ESMARK 6X9 LF (GAUZE/BANDAGES/DRESSINGS) IMPLANT
BANDAGE GAUZE ELAST BULKY 4 IN (GAUZE/BANDAGES/DRESSINGS) IMPLANT
BNDG ESMARK 6X9 LF (GAUZE/BANDAGES/DRESSINGS)
CANISTER SUCTION 2500CC (MISCELLANEOUS) ×2 IMPLANT
CLIP LIGATING EXTRA MED SLVR (CLIP) ×2 IMPLANT
CLIP LIGATING EXTRA SM BLUE (MISCELLANEOUS) ×2 IMPLANT
CLOTH BEACON ORANGE TIMEOUT ST (SAFETY) ×2 IMPLANT
COVER SURGICAL LIGHT HANDLE (MISCELLANEOUS) ×4 IMPLANT
CUFF TOURNIQUET SINGLE 34IN LL (TOURNIQUET CUFF) IMPLANT
CUFF TOURNIQUET SINGLE 44IN (TOURNIQUET CUFF) IMPLANT
DRAIN SNY 10X20 3/4 PERF (WOUND CARE) IMPLANT
DRAPE ORTHO SPLIT 77X108 STRL (DRAPES) ×2
DRAPE PROXIMA HALF (DRAPES) ×2 IMPLANT
DRAPE SURG ORHT 6 SPLT 77X108 (DRAPES) ×2 IMPLANT
ELECT REM PT RETURN 9FT ADLT (ELECTROSURGICAL) ×2
ELECTRODE REM PT RTRN 9FT ADLT (ELECTROSURGICAL) ×1 IMPLANT
EVACUATOR SILICONE 100CC (DRAIN) IMPLANT
GAUZE XEROFORM 5X9 LF (GAUZE/BANDAGES/DRESSINGS) ×2 IMPLANT
GLOVE BIO SURGEON STRL SZ 6.5 (GLOVE) ×4 IMPLANT
GLOVE BIOGEL PI IND STRL 7.5 (GLOVE) ×2 IMPLANT
GLOVE BIOGEL PI INDICATOR 7.5 (GLOVE) ×2
GLOVE SS BIOGEL STRL SZ 7.5 (GLOVE) ×1 IMPLANT
GLOVE SUPERSENSE BIOGEL SZ 7.5 (GLOVE) ×1
GLOVE SURG SS PI 6.0 STRL IVOR (GLOVE) ×2 IMPLANT
GOWN STRL NON-REIN LRG LVL3 (GOWN DISPOSABLE) ×6 IMPLANT
KIT BASIN OR (CUSTOM PROCEDURE TRAY) ×2 IMPLANT
KIT ROOM TURNOVER OR (KITS) ×2 IMPLANT
NS IRRIG 1000ML POUR BTL (IV SOLUTION) ×2 IMPLANT
PACK GENERAL/GYN (CUSTOM PROCEDURE TRAY) ×2 IMPLANT
PAD ARMBOARD 7.5X6 YLW CONV (MISCELLANEOUS) ×4 IMPLANT
PADDING CAST COTTON 6X4 STRL (CAST SUPPLIES) IMPLANT
SAW GIGLI STERILE 20 (MISCELLANEOUS) ×2 IMPLANT
SPONGE GAUZE 4X4 12PLY (GAUZE/BANDAGES/DRESSINGS) ×2 IMPLANT
STAPLER VISISTAT 35W (STAPLE) ×2 IMPLANT
STOCKINETTE IMPERVIOUS LG (DRAPES) ×2 IMPLANT
SUT ETHILON 3 0 PS 1 (SUTURE) IMPLANT
SUT VIC AB 0 CT1 18XCR BRD 8 (SUTURE) ×2 IMPLANT
SUT VIC AB 0 CT1 8-18 (SUTURE) ×2
SUT VICRYL 2 0 18  TIES (SUTURE) ×1
SUT VICRYL 2 0 18 TIES (SUTURE) ×1 IMPLANT
TOWEL OR 17X24 6PK STRL BLUE (TOWEL DISPOSABLE) ×2 IMPLANT
TOWEL OR 17X26 10 PK STRL BLUE (TOWEL DISPOSABLE) ×2 IMPLANT
UNDERPAD 30X30 INCONTINENT (UNDERPADS AND DIAPERS) ×2 IMPLANT
WATER STERILE IRR 1000ML POUR (IV SOLUTION) ×2 IMPLANT

## 2012-02-05 NOTE — Anesthesia Preprocedure Evaluation (Addendum)
Anesthesia Evaluation  Patient identified by MRN, date of birth, ID band Patient awake    Reviewed: Allergy & Precautions, H&P , NPO status , Patient's Chart, lab work & pertinent test results, reviewed documented beta blocker date and time   History of Anesthesia Complications Negative for: history of anesthetic complications  Airway  TM Distance: >3 FB Neck ROM: Full    Dental  (+) Edentulous Upper, Edentulous Lower and Dental Advisory Given   Pulmonary shortness of breath and with exertion, pneumonia , Current Smoker,  breath sounds clear to auscultation        Cardiovascular hypertension, Pt. on medications and Pt. 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, Residual Symptoms    GI/Hepatic   Endo/Other  Diabetes mellitus-, Poorly Controlled, Type 2, Insulin DependentHypothyroidism   Renal/GU      Musculoskeletal   Abdominal   Peds  Hematology   Anesthesia Other Findings   Reproductive/Obstetrics                       Anesthesia Physical Anesthesia Plan  ASA: III  Anesthesia Plan: General   Post-op Pain Management:    Induction: Intravenous  Airway Management Planned: LMA  Additional Equipment:   Intra-op Plan:   Post-operative Plan:   Informed Consent: I have reviewed the patients History and Physical, chart, labs and discussed the procedure including the risks, benefits and alternatives for the proposed anesthesia with the patient or authorized representative who has indicated his/her understanding and acceptance.     Plan Discussed with:   Anesthesia Plan Comments: (Osteomyelitis R. Foot Type 2 DM Glucose 102 Htn H/O spinal cord stroke 2010 LE  Weakness Gout  Plan GA LMA OK  Kipp Brood, MD)        Anesthesia Quick Evaluation

## 2012-02-05 NOTE — Progress Notes (Signed)
ANTIBIOTIC CONSULT NOTE - FOLLOW UP  Pharmacy Consult for Vancomycin Indication: Osteo + fascitis R foot  No Known Allergies  Patient Measurements: Height: 4\' 11"  (149.9 cm) Weight: 173 lb 11.6 oz (78.8 kg) IBW/kg (Calculated) : 43.2  Adjusted Body Weight:  Vital Signs: Temp: 97.7 F (36.5 C) (05/17 0822) Temp src: Oral (05/17 0822) BP: 142/62 mmHg (05/17 1000) Pulse Rate: 87  (05/17 1000) Intake/Output from previous day: 05/16 0701 - 05/17 0700 In: 3876.1 [P.O.:450; I.V.:568.6; IV Piggyback:757.5] Out: 2290 [Urine:2290] Intake/Output from this shift: Total I/O In: 266.5 [I.V.:54; IV Piggyback:212.5] Out: 275 [Urine:275]  Labs:  [facility] 02/05/12 0520 02/04/12 0400 02/03/12 1825  WBC 12.8* 15.3* 15.1*  HGB 9.5* 9.4* 10.6*  PLT 399 398 444*  LABCREA -- -- --  CREATININE 0.72 0.62 0.77   Estimated Creatinine Clearance: 61 ml/min (by C-G formula based on Cr of 0.72).  Basename 02/05/12 0834  VANCOTROUGH 15.8  VANCOPEAK --  VANCORANDOM --  GENTTROUGH --  GENTPEAK --  GENTRANDOM --  TOBRATROUGH --  Nolen Mu --  TOBRARND --  AMIKACINPEAK --  AMIKACINTROU --  AMIKACIN --     Microbiology: Recent Results (from the past 720 hour(s))  MRSA PCR SCREENING     Status: Normal   Collection Time   02/03/12  6:10 PM      Component Value Range Status Comment   MRSA by PCR NEGATIVE  NEGATIVE  Final   CULTURE, BLOOD (ROUTINE X 2)     Status: Normal (Preliminary result)   Collection Time   02/03/12  6:15 PM      Component Value Range Status Comment   Specimen Description BLOOD CENTRAL LINE   Final    Special Requests     Final    Value: BOTTLES DRAWN AEROBIC AND ANAEROBIC 10CC LEFT IJ CVC   Culture  Setup Time [id number]   Final    Culture     Final    Value:        BLOOD CULTURE RECEIVED NO GROWTH TO DATE CULTURE WILL BE HELD FOR 5 DAYS BEFORE ISSUING A FINAL NEGATIVE REPORT   Report Status PENDING   Incomplete   URINE CULTURE     Status: Normal   Collection Time     02/03/12  6:20 PM      Component Value Range Status Comment   Specimen Description URINE, CATHETERIZED   Final    Special Requests NONE   Final    Culture  Setup Time [id number]   Final    Colony Count NO GROWTH   Final    Culture NO GROWTH   Final    Report Status 02/05/2012 FINAL   Final   CULTURE, BLOOD (ROUTINE X 2)     Status: Normal (Preliminary result)   Collection Time   02/03/12  7:58 PM      Component Value Range Status Comment   Specimen Description BLOOD HAND RIGHT   Final    Special Requests BOTTLES DRAWN AEROBIC ONLY 3CC   Final    Culture  Setup Time [id number]   Final    Culture     Final    Value:        BLOOD CULTURE RECEIVED NO GROWTH TO DATE CULTURE WILL BE HELD FOR 5 DAYS BEFORE ISSUING A FINAL NEGATIVE REPORT   Report Status PENDING   Incomplete     Anti-infectives     Start     Dose/Rate Route Frequency Ordered Stop   02/03/12 2000  vancomycin (VANCOCIN) IVPB 1000 mg/200 mL premix        1,000 mg 200 mL/hr over 60 Minutes Intravenous Every 12 hours 02/03/12 0919     02/03/12 1730   piperacillin-tazobactam (ZOSYN) IVPB 3.375 g  Status:  Discontinued        3.375 g 100 mL/hr over 30 Minutes Intravenous  Once 02/03/12 1723 02/03/12 1725   02/03/12 1730   vancomycin (VANCOCIN) IVPB 1000 mg/200 mL premix  Status:  Discontinued        1,000 mg 200 mL/hr over 60 Minutes Intravenous  Once 02/03/12 1723 02/03/12 1725   02/03/12 0100  piperacillin-tazobactam (ZOSYN) IVPB 3.375 g       3.375 g 12.5 mL/hr over 240 Minutes Intravenous Every 8 hours 02/02/12 2233     02/02/12 1700   vancomycin (VANCOCIN) 1,250 mg in sodium chloride 0.9 % 250 mL IVPB  Status:  Discontinued        1,250 mg 166.7 mL/hr over 90 Minutes Intravenous Every 12 hours 02/02/12 1613 02/03/12 0919   02/02/12 1545  piperacillin-tazobactam (ZOSYN) 3.375 g in dextrose 5 % 50 mL IVPB       3.375 g 100 mL/hr over 30 Minutes Intravenous  Once 02/02/12 1530 02/02/12 1937           Assessment:  Right foot sore  Anticoagulation: Hep SQ for VTE px. CBC stable  Infectious Disease: Tmax 99.3, WBC 12.8 down. Foot Xray concerning for osteo, MRI shows calcaneal osteo and low level fascitis. Vanco trough this am 15.8 in goal.  5/14: Vanc >> 5/14: Zosyn >>5/16  5/15: MRSA PCR: neg 5/15: Blood x2: 5/15: Urine: 5/15: Resp cx:  Goal of Therapy:  Vanco trough 15-20  Plan:  Vanco 1g/12h continue Zosyn 3.375g IV q12h  Misty Stanley Stillinger 02/05/2012,10:45 AM

## 2012-02-05 NOTE — Anesthesia Postprocedure Evaluation (Signed)
  Anesthesia Post-op Note  Patient: Alexandra Wiley  Procedure(s) Performed: Procedure(s) (LRB): AMPUTATION BELOW KNEE (Right)  Patient Location: PACU  Anesthesia Type: General  Level of Consciousness: awake, alert  and oriented  Airway and Oxygen Therapy: Patient Spontanous Breathing and Patient connected to nasal cannula oxygen  Post-op Pain: mild  Post-op Assessment: Post-op Vital signs reviewed and Patient's Cardiovascular Status Stable  Post-op Vital Signs: stable  Complications: No apparent anesthesia complications

## 2012-02-05 NOTE — Progress Notes (Signed)
Family Medicine Teaching Service Brief Note: [phone] Patient signed consent for surgery which will happen today. Feels ambivalent about decision. Otherwise no complaints. She has been off the dobutamine drip since yesterday afternoon with blood pressures 94-143/39-53 and HR: 62-95. On exam, expiratory wheezing bilaterally, stable 3/6 systolic murmur. Right foot continues to drain with odor.  Patient will likely benefit from chaplain visit. Once done with surgery and out of the ICU, will benefit from intensive tobacco smoking cessation.  Will continue to follow. Appreciate CCM's management.   Marena Chancy, PGY-1 Family Medicine Teaching Service

## 2012-02-05 NOTE — Care Management Note (Addendum)
    Page 1 of 2   02/17/2012     12:45:46 PM   CARE MANAGEMENT NOTE 02/17/2012  Patient:  Alexandra Wiley, Alexandra Wiley   Account Number:  [id number]  Date Initiated:  02/03/2012  Documentation initiated by:  ROYAL,CHERYL  Subjective/Objective Assessment:   Referral for [facility] from admission RN, however needs not identified.     Action/Plan:   Per chart pt self catherizes and family assist with bowel regime. Will await recommendations for [facility] needs.   Anticipated DC Date:  02/16/2012   Anticipated DC Plan:  IP REHAB FACILITY      DC Planning Services  CM consult      [facility] Choice  HOME HEALTH   Choice offered to / List presented to:  C-4 Adult Children        HH arranged  HH-1 RN  HH-10 DISEASE MANAGEMENT  HH-2 PT  HH-3 OT  HH-4 NURSE'S AIDE      HH agency  [facility]   Status of service:  Completed, signed off Medicare Important Message given?   (If response is "NO", the following Medicare IM given date fields will be blank) Date Medicare IM given:   Date Additional Medicare IM given:    Discharge Disposition:  HOME W HOME HEALTH SERVICES  Per UR Regulation:  Reviewed for med. necessity/level of care/duration of stay  If discussed at Long Length of Stay Meetings, dates discussed:   02/17/2012    Comments:  02-17-12 1242 Tomi Bamberger, RN,BSN [phone] CM spoke to pt and family and they are in agrreance to home with [facility] services. CM stressed turning pt to et pt off bottom and l heel to decrease ulcers. CM spoke to daughter Joanna Puff and she stated that she would be there 4/7 days. CM did make referral to [facility] for services listed above. SOC to begin within 24-48 hours post d/c. CM did give pt/family copy of [facility] services. CM also asked MD to place order for home transportation via ambulance. Pt was discussed in the long length of stay meeting this am.   02-16-12 [address] Tomi Bamberger, [place] [phone] CM did speak to son Delila Pereyra and he states his mom would  rather go home with [facility] services. Pt will need HH PT services, RN and aide. Per son pt has dme rw and cane at home. Per son husband is at home and i husband leaves family will provide supervision. CM will f/u in am for dipsosition needs. Family has chosen [facility] for services.   5/20 10:47a debbie dowell rn,bsn pt on vent. will cont to moniter for dc needs as pt progresses.  02-05-12 Patient was transferred to ICU on 02-03-12 sepsis SBP's 60 's . MD note : surgery recommends amputation of left foot but patient is not agreeable to this. Did explain to the patient the complications of letting infection spread. Will come and readdress this in the afternoon. Surgery tentatively scheduled for tomorrow. - dobutamine drip for low bp  Ronny Flurry RN BSN [phone]

## 2012-02-05 NOTE — Progress Notes (Signed)
FMTS Attending Note  Patient seen and examined by me.  She has consented for surgery, to take place later today.    Discussed with CCM attending; given that she has been stable off dobutamine, she may come back to ICU briefly post-op and then out to floor in AM. Paula Compton, MD

## 2012-02-05 NOTE — Progress Notes (Signed)
Pt now being followed by PCCM team. This CSW has informed 2100 CSW Judie Petit. [place]) of transfer.  Theresia Bough, MSW, Theresia Majors [phone]

## 2012-02-05 NOTE — Anesthesia Procedure Notes (Signed)
Procedure Name: LMA Insertion Date/Time: 02/05/2012 1:42 PM Performed by: Romie Minus K Pre-anesthesia Checklist: Patient identified, Emergency Drugs available, Suction available, Patient being monitored and Timeout performed Patient Re-evaluated:Patient Re-evaluated prior to inductionOxygen Delivery Method: Circle system utilized Preoxygenation: Pre-oxygenation with 100% oxygen Intubation Type: IV induction Ventilation: Mask ventilation without difficulty LMA: LMA inserted LMA Size: 4.0 Number of attempts: 1 Tube secured with: Tape Dental Injury: Teeth and Oropharynx as per pre-operative assessment

## 2012-02-05 NOTE — Transfer of Care (Signed)
Immediate Anesthesia Transfer of Care Note  Patient: Alexandra Wiley  Procedure(s) Performed: Procedure(s) (LRB): AMPUTATION BELOW KNEE (Right)  Patient Location: PACU  Anesthesia Type: General  Level of Consciousness: awake, alert , oriented and patient cooperative  Airway & Oxygen Therapy: Patient Spontanous Breathing and Patient connected to nasal cannula oxygen  Post-op Assessment: Report given to PACU RN, Post -op Vital signs reviewed and stable and Patient moving all extremities X 4  Post vital signs: Reviewed and stable  Complications: No apparent anesthesia complications

## 2012-02-05 NOTE — Progress Notes (Signed)
Clinical Social Worker went to pt's room to review [facility], pt currently in OR.  CSW spoke with pt's RN.  CSW placed an Advanced Directives packet in pt's room; RN will inform pt.  CSW will follow up with pt to review questions/concerns related to Advanced Directives.   Angelia Mould, MSW, [place] [phone]

## 2012-02-05 NOTE — Progress Notes (Signed)
Clinical Child psychotherapist received referral for [facility].  This CSW went to pt's room; pt and family currently on cell phone calls.  Family requested CSW to return at another time.    Angelia Mould, MSW, [place] [phone]

## 2012-02-05 NOTE — Preoperative (Signed)
Beta Blockers   Reason not to administer Beta Blockers:Hold beta blocker due to hypotension 

## 2012-02-05 NOTE — Progress Notes (Signed)
Patient ID: Alexandra Wiley, female   DOB: 1946/06/05, 66 y.o.   MRN: [id number] The patient remains comfortable. She had not been agreeing to consideration of right above-knee or below-knee amputation. She is now comfortable with this decision. She is here today with her son and her cousin. I again discussed the situation at length with the patient and her family. I explained that with her extensive necrosis in her right heel right fifth toe and right mid tarsal first head and extension into her calcaneus that she had a nonsalvageable foot even with attempted revascularization. I had a long discussion again regarding the benefits of above-knee versus below-knee amputation. She does not have any tenderness in her calf in her necrotic areas beginning just above her ankle. I explained that she was somewhat borderline for healing of her below-knee amputation but that she may be able to heal a below-knee amputation. With her neurologic deficit this probably make minimal difference in her rehabilitation ability. I explained that it was a more sure chance of healing of a right above-knee amputation. Her cousin states she may have a friend who had a below-knee amputation at the hospital has had continued difficulty and once above-knee amputation. The patient has decided to proceed with above-knee of dictation with a higher chance of healing. I discussed the surgery to include the level of skin incision and also a shorter level of the femur with the good chance eventual healing. I am also concerned regarding her left heel. She is having breakdown of this which was present on my first consultation with her. We will continue with a noncontact on that area. I do feel that she will need further arteriogram and potential revascularization for salvage of her left foot. I again explained this to the patient and her family present. They all are comfortable with the decision to proceed with right above-knee amputation today with  plan for evaluation of her left lower extremity arterial insufficiency to follow

## 2012-02-05 NOTE — Progress Notes (Signed)
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   Episode of asymptomatic hypotension 5/15 prompted transfer to ICU and concern for sepsis.    Lines / Drains: IJ TLC 5/15>>>  Cultures: 5/15 BCx2>>> 5/15 Sputum>>> 5/15 UA>>> 5/15 UC>>>  Antibiotics: 5/14 Vanco (osteo)>>> 5/14 Zosyn (oseto)>>>  Tests / Events: 5/14 Foot XRAY>>>Significant soft tissue swelling diffusely in right foot with osseous demineralization. Suspected area of bone destruction at the posterior margin of the calcaneus deep to the known wound suspicious for osteomyelitis. If further imaging is required recommend MR imaging with and without contrast. 5/14 R FOOT MRI>>>Posterior calcaneal osteomyelitis with adjacent cellulitis.  Suspected low-level fasciitis tracking along the margins of the plantar fascia. Dorsal subcutaneous edema in the foot probably represents cellulitis given the low-level associated enhancement.  No drainable abscess observed  SUBJ:  Pleasant, no distress, oriented, no new complaints  Vital Signs: Temp:  [97.7 F (36.5 C)-99.3 F (37.4 C)] 97.7 F (36.5 C) (05/17 0822) Pulse Rate:  [62-95] 87  (05/17 1000) Resp:  [14-20] 18  (05/17 1000) BP: (94-143)/(37-72) 142/62 mmHg (05/17 1000) SpO2:  [96 %-100 %] 98 % (05/17 1000) FiO2 (%):  [2 %] 2 % (05/16 1200) I/O last 3 completed shifts: In: 6381.8 [P.O.:570; I.V.:2704.3; Other:2100; IV Piggyback:1007.5] Out: 3315 [Urine:3315]  Physical Examination: General:chronically ill elderly female Neuro: AAOx4, speech clear, no focal deficits CV: s1s2 rrr PULM: resp's even/non-labored, lungs bilaterally coarse GI:  round/soft, bsx4 active Extremities: cool/dry, chronic LE venous changes, RLE:  Quarter sized, round opening medial aspect of foot, heel with large eschar cap, foul smelling drainage  Labs    CBC  Lab 02/05/12 0520 02/04/12 0400 02/03/12 1825  HGB 9.5* 9.4* 10.6*  HCT 28.9* 28.3* 31.9*  WBC 12.8* 15.3* 15.1*  PLT 399 398 444*   BMET  Lab 02/05/12 0520 02/04/12 0400 02/03/12 1825 02/03/12 0540 02/02/12 1539  NA 139 134* 138 137 135  K 3.8 3.6 -- -- --  CL 105 102 104 101 96  CO2 27 24 26 26 25   GLUCOSE 95 156* 107* 161* 140*  BUN 4* 6 9 13 19   CREATININE 0.72 0.62 0.77 0.82 0.81  CALCIUM 8.7 8.1* 8.8 8.9 10.1  MG -- 1.5 1.6 -- --  PHOS -- 3.3 3.5 -- --    Lab 02/05/12 0520 02/03/12 1825  INR 1.11 1.16     Lab 02/03/12 2130 02/03/12 1910  PHART -- --  PCO2ART -- --  PO2ART -- --  HCO3 -- --  TCO2 -- --  O2SAT 71.5 61.0    Radiology: No new CXR  Assessment and Plan: 66 year old female with right sided osteomylitis with resolving septic shock. Patient to OR today for BKA with vascular surgery. If uncomplicated post-op will return to floor bed.   Sepsis secondary to osteomyelitis of right foot  Assessment: Shock -resolved. EGDT completed 5/15 Plan:  -F/U cx results -Cont Vanc and zosyn. -Right BKA today 5/17    Assessment: DM Plan:  -Cont SSI - ACHS.  Hx of HTN / CHF Assessment: Hypotension resolved Plan:  Cont to hold all anti-HTN.   A: Anemia- no indication  for PRBCs at this time P: -monitor A: Conus medullaris syndrome Plan:  -Per primary family medicine.  A: Hx of Gout:  P: -monitor.    To OR today for BKA. If no post operative complications, she will be able to transfer to med-surg bed 5/18.   Billy Fischer, MD;  PCCM service; [place] [phone]

## 2012-02-05 NOTE — Progress Notes (Signed)
Chaplain Note;  Chaplain visited with pt and pt's family.  Pt was reclining in bed, awake, alert, and open to conversation.  Pt's family was seated around the room.   The pt spoke about the state of her spirit, describing it, as down but regaining strength.  News about her need for surgery had deflated her.  Chaplain provided spiritual comfort, support, and prayer for pt and pt's family.  Pt and family expressed appreciation for chaplain support.  Chaplain will follow up as needed.  02/05/12 1130  Clinical Encounter Type  Visited With Patient and family together  Visit Type Initial;Spiritual support  Referral From Physician  Spiritual Encounters  Spiritual Needs Emotional;Prayer  Stress Factors  Patient Stress Factors Loss of control;Major life changes;Health changes;Loss  Family Stress Factors Major life changes;Loss of control;Lack of knowledge;Family relationships   Verdie Shire, chaplain resident [phone]

## 2012-02-05 NOTE — Op Note (Signed)
OPERATIVE REPORT  DATE OF SURGERY: 02/05/2012  PATIENT: Alexandra Wiley, 66 y.o. female MRN: [id number]  DOB: [birth year]  PRE-OPERATIVE DIAGNOSIS: Gangrene right foot  POST-OPERATIVE DIAGNOSIS:  Same  PROCEDURE: Right above-knee amputation  SURGEON:  Gretta Began, M.D.   ASSISTANT: Nurse  ANESTHESIA:  Gen.  EBL: 75 ml  Total I/O In: 1414 [I.V.:1164; IV Piggyback:250] Out: 2050 [Urine:1975; Blood:75]  BLOOD ADMINISTERED: None  DRAINS: None  SPECIMEN: Right above-knee amputation specimen  COUNTS CORRECT:  YES  PLAN OF CARE: PACU   PATIENT DISPOSITION:  PACU - hemodynamically stable  PROCEDURE DETAILS: The patient was taken to the operating room placed supine position where the area of the right thigh and leg were prepped and draped in usual sterile fashion. A fishmouth incision was made just above the knee and carried down to the subcutaneous tissue with electrocautery. The muscle and fascia were opened in line with the skin incision. There was minimal bleeding from the muscle. The saphenous vein superficial artery femoral vein and nerve were individually ligated with 0 Vicryl ties and divided. Periosteum was elevated off the femur. The femur was divided with a Gigli saw. Specimen was passed off the field. The wound was irrigated with saline and hemostasis was obtained with electrocautery. The anterior fascia was closed to the posterior fascia with 0 Vicryl figure-of-eight sutures. The skin was closed with skin staples. Sterile dressing was applied and the patient was taken to the recovery room in stable condition   Gretta Began, M.D. 02/05/2012 2:58 PM

## 2012-02-06 ENCOUNTER — Inpatient Hospital Stay (HOSPITAL_COMMUNITY): Payer: Medicare Other

## 2012-02-06 DIAGNOSIS — R197 Diarrhea, unspecified: Secondary | ICD-10-CM

## 2012-02-06 DIAGNOSIS — J9600 Acute respiratory failure, unspecified whether with hypoxia or hypercapnia: Secondary | ICD-10-CM

## 2012-02-06 DIAGNOSIS — G959 Disease of spinal cord, unspecified: Secondary | ICD-10-CM

## 2012-02-06 DIAGNOSIS — R404 Transient alteration of awareness: Secondary | ICD-10-CM

## 2012-02-06 LAB — POCT I-STAT 3, ART BLOOD GAS (G3+)
Acid-Base Excess: 1 mmol/L (ref 0.0–2.0)
Bicarbonate: 26.4 mEq/L — ABNORMAL HIGH (ref 20.0–24.0)
O2 Saturation: 94 %
TCO2: 28 mmol/L (ref 0–100)
pCO2 arterial: 45.1 mmHg — ABNORMAL HIGH (ref 35.0–45.0)
pO2, Arterial: 74 mmHg — ABNORMAL LOW (ref 80.0–100.0)

## 2012-02-06 LAB — CARDIAC PANEL(CRET KIN+CKTOT+MB+TROPI)
CK, MB: 3.2 ng/mL (ref 0.3–4.0)
Relative Index: 0.7 (ref 0.0–2.5)
Relative Index: 0.7 (ref 0.0–2.5)
Total CK: 313 U/L — ABNORMAL HIGH (ref 7–177)
Total CK: 466 U/L — ABNORMAL HIGH (ref 7–177)

## 2012-02-06 LAB — BASIC METABOLIC PANEL
BUN: 5 mg/dL — ABNORMAL LOW (ref 6–23)
Calcium: 8.3 mg/dL — ABNORMAL LOW (ref 8.4–10.5)
Chloride: 101 mEq/L (ref 96–112)
Creatinine, Ser: 1.25 mg/dL — ABNORMAL HIGH (ref 0.50–1.10)
GFR calc Af Amer: 50 mL/min — ABNORMAL LOW (ref >90)
GFR calc non Af Amer: 43 mL/min — ABNORMAL LOW (ref >90)

## 2012-02-06 LAB — CBC
HCT: 26.7 % — ABNORMAL LOW (ref 36.0–46.0)
HCT: 24.3 % — ABNORMAL LOW (ref 36.0–46.0)
Hemoglobin: 8 g/dL — ABNORMAL LOW (ref 12.0–15.0)
MCH: 27.3 pg (ref 26.0–34.0)
MCHC: 32.2 g/dL (ref 30.0–36.0)
MCHC: 32.9 g/dL (ref 30.0–36.0)
MCV: 82.9 fL (ref 78.0–100.0)
Platelets: 375 10*3/uL (ref 150–400)
RBC: 2.93 MIL/uL — ABNORMAL LOW (ref 3.87–5.11)
RDW: 14.9 % (ref 11.5–15.5)
WBC: 15.5 10*3/uL — ABNORMAL HIGH (ref 4.0–10.5)

## 2012-02-06 LAB — GLUCOSE, CAPILLARY

## 2012-02-06 MED ORDER — NALOXONE HCL 0.4 MG/ML IJ SOLN
0.4000 mg | INTRAMUSCULAR | Status: DC | PRN
  Administered 2012-02-06: 0.4 mg via INTRAVENOUS

## 2012-02-06 MED ORDER — PROPOFOL 10 MG/ML IV EMUL
INTRAVENOUS | Status: AC
  Filled 2012-02-06: qty 100

## 2012-02-06 MED ORDER — NALOXONE HCL 0.4 MG/ML IJ SOLN
INTRAMUSCULAR | Status: AC
  Administered 2012-02-06: 0.4 mg
  Filled 2012-02-06: qty 1

## 2012-02-06 MED ORDER — SODIUM CHLORIDE 0.9 % IV BOLUS (SEPSIS)
750.0000 mL | Freq: Once | INTRAVENOUS | Status: AC
  Administered 2012-02-06: 750 mL via INTRAVENOUS

## 2012-02-06 MED ORDER — MIDAZOLAM HCL 2 MG/2ML IJ SOLN
2.0000 mg | Freq: Once | INTRAMUSCULAR | Status: AC
  Administered 2012-02-06 (×2): 2 mg via INTRAVENOUS

## 2012-02-06 MED ORDER — SODIUM CHLORIDE 0.9 % IV BOLUS (SEPSIS)
500.0000 mL | Freq: Once | INTRAVENOUS | Status: AC
  Administered 2012-02-06: 500 mL via INTRAVENOUS

## 2012-02-06 MED ORDER — NOREPINEPHRINE BITARTRATE 1 MG/ML IJ SOLN
2.0000 ug/min | INTRAVENOUS | Status: DC
  Administered 2012-02-06: 1.5 ug/min via INTRAVENOUS
  Administered 2012-02-06: 2.507 ug/min via INTRAVENOUS
  Filled 2012-02-06 (×2): qty 4

## 2012-02-06 MED ORDER — MIDAZOLAM HCL 2 MG/2ML IJ SOLN
INTRAMUSCULAR | Status: AC
  Administered 2012-02-06: 2 mg via INTRAVENOUS
  Filled 2012-02-06: qty 2

## 2012-02-06 MED ORDER — ETOMIDATE 2 MG/ML IV SOLN
15.0000 mg | Freq: Once | INTRAVENOUS | Status: AC
  Administered 2012-02-06: 15 mg via INTRAVENOUS

## 2012-02-06 MED ORDER — MORPHINE SULFATE 2 MG/ML IJ SOLN: 1.0000 mg | INTRAMUSCULAR | Status: DC | PRN

## 2012-02-06 MED ORDER — SODIUM CHLORIDE 0.9 % IV BOLUS (SEPSIS)
1000.0000 mL | Freq: Once | INTRAVENOUS | Status: AC
  Administered 2012-02-06: 1000 mL via INTRAVENOUS

## 2012-02-06 MED ORDER — HYDROCORTISONE SOD SUCCINATE 100 MG IJ SOLR
50.0000 mg | Freq: Four times a day (QID) | INTRAMUSCULAR | Status: DC
  Administered 2012-02-06 – 2012-02-08 (×8): 50 mg via INTRAVENOUS
  Filled 2012-02-06 (×12): qty 1

## 2012-02-06 MED ORDER — ETOMIDATE 2 MG/ML IV SOLN
INTRAVENOUS | Status: AC
  Administered 2012-02-06: 20:00:00
  Filled 2012-02-06: qty 10

## 2012-02-06 MED ORDER — LEVOTHYROXINE SODIUM 100 MCG IV SOLR
37.5000 ug | Freq: Every day | INTRAVENOUS | Status: DC
  Administered 2012-02-06 – 2012-02-09 (×4): 38 ug via INTRAVENOUS
  Filled 2012-02-06 (×5): qty 1.9

## 2012-02-06 MED ORDER — FENTANYL CITRATE 0.05 MG/ML IJ SOLN
INTRAMUSCULAR | Status: AC
  Administered 2012-02-06: 18:00:00
  Filled 2012-02-06: qty 2

## 2012-02-06 MED ORDER — SODIUM CHLORIDE 0.9 % IV SOLN
INTRAVENOUS | Status: DC
  Administered 2012-02-06 – 2012-02-07 (×4): via INTRAVENOUS
  Administered 2012-02-08: 100 mL/h via INTRAVENOUS

## 2012-02-06 MED ORDER — PROPOFOL 10 MG/ML IV EMUL
5.0000 ug/kg/min | INTRAVENOUS | Status: DC
  Administered 2012-02-06: 5 ug/kg/min via INTRAVENOUS

## 2012-02-06 MED ORDER — FENTANYL CITRATE 0.05 MG/ML IJ SOLN
100.0000 ug | INTRAMUSCULAR | Status: DC | PRN
  Administered 2012-02-08 – 2012-02-12 (×2): 100 ug via INTRAVENOUS
  Administered 2012-02-14: 50 ug via INTRAVENOUS
  Filled 2012-02-06 (×3): qty 2

## 2012-02-06 MED ORDER — FENTANYL CITRATE 0.05 MG/ML IJ SOLN
100.0000 ug | Freq: Once | INTRAMUSCULAR | Status: AC
  Administered 2012-02-06: 100 ug via INTRAVENOUS

## 2012-02-06 MED ORDER — PANTOPRAZOLE SODIUM 40 MG IV SOLR
40.0000 mg | Freq: Every day | INTRAVENOUS | Status: DC
  Administered 2012-02-06 – 2012-02-09 (×4): 40 mg via INTRAVENOUS
  Filled 2012-02-06 (×5): qty 40

## 2012-02-06 NOTE — Progress Notes (Addendum)
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   Episode of asymptomatic hypotension 5/15 prompted transfer to ICU and concern for sepsis.    Lines / Drains: IJ TLC 5/15>>>  Cultures: 5/15 BCx2>>> 5/15 Sputum>>> 5/15 UC>>>neg  Antibiotics: 5/14 Vanco (osteo)>>> 5/14 Zosyn (oseto)>>>  Tests / Events: 5/14 Foot XRAY>>>Significant soft tissue swelling diffusely in right foot with osseous demineralization. Suspected area of bone destruction at the posterior margin of the calcaneus deep to the known wound suspicious for osteomyelitis. If further imaging is required recommend MR imaging with and without contrast. 5/14 R FOOT MRI>>>Posterior calcaneal osteomyelitis with adjacent cellulitis.  Suspected low-level fasciitis tracking along the margins of the plantar fascia. Dorsal subcutaneous edema in the foot probably represents cellulitis given the low-level associated enhancement.  No drainable abscess observed  SUBJ:  Lethargic, difficult to arouse.   Vital Signs: Temp:  [97.8 F (36.6 C)-99.4 F (37.4 C)] 98.4 F (36.9 C) (05/18 0754) Pulse Rate:  [79-125] 125  (05/18 0900) Resp:  [10-23] 20  (05/18 0900) BP: (66-142)/(41-91) 95/53 mmHg (05/18 0900) SpO2:  [91 %-99 %] 91 % (05/18 0900) Weight:  [78.8 kg (173 lb 11.6 oz)] 78.8 kg (173 lb 11.6 oz) (05/18 0600) I/O last 3 completed shifts: In: 4654 [I.V.:1554; Other:2100; IV Piggyback:1000] Out: 2655 [Urine:2580; Blood:75]  Physical Examination: General:chronically ill lethargic elderly female Neuro: difficult to arouse, does not follow comands CV: s1s2 rrr PULM:lungs bilaterally coarse, diminished GI:  round/soft, bsx4 active Extremities: cool/dry, chronic LE venous changes s/p R BKA  Labs    CBC  Lab 02/06/12 0511 02/05/12 0520 02/04/12 0400  HGB 8.6* 9.5* 9.4*  HCT 26.7* 28.9* 28.3*  WBC 15.5* 12.8* 15.3*  PLT 375 399 398   BMET  Lab 02/06/12 0511 02/05/12 0520 02/04/12 0400 02/03/12 1825 02/03/12 0540  NA 136 139 134* 138 137  K 4.0 3.8 -- -- --  CL 101 105 102 104 101  CO2 27 27 24 26 26   GLUCOSE 94 95 156* 107* 161*  BUN 5* 4* 6 9 13   CREATININE 1.25* 0.72 0.62 0.77 0.82  CALCIUM 8.3* 8.7 8.1* 8.8 8.9  MG -- -- 1.5 1.6 --  PHOS -- -- 3.3 3.5 --    Lab 02/05/12 0520 02/03/12 1825  INR 1.11 1.16     Lab 02/03/12 2130 02/03/12 1910  PHART -- --  IRJ1OAC -- --  PO2ART -- --  HCO3 -- --  TCO2 -- --  O2SAT 71.5 61.0    Radiology: No new CXR  Assessment and Plan: A:Sepsis secondary to osteomyelitis of right foot -s/p R BKA 5/17 Plan:  -F/U cx results -Cont Vanc and zosyn. - assess bone culture if done  A: SIRS with mild hypotension and low UOP- volume depletion vs. Sepsis    P: -monitor CVP, maintain 8-12 -monitor UOP -needs bolus this am -if dops BP , cortisol -Neg balance yesterday, avoid today -low threshold A line -may need reversal narcs with drop in BP  A: Acute Encephalopathy secondary to narcotic overdose. ABG ok P: -hold on Narcan for now, abg reviewed -may need narcan challenge -decrease Morphine dose, and hold until  awake -Monitor in ICU   A: Leukocytosis P: -Continue ABX -follow OR results  A: Hx of HTN / CHF P: -hold anti hypertensives   A: Anemia- no indication for PRBCs at this time P: -monitor, cbc now with drop in BP  A: Conus medullaris syndrome Plan:  -Per primary (family medicine)  A: DM Plan:  -Cont SSI - ACHS  A: Hx of Gout:  P: -monitor.   Update: Neurostatus still poor after narcan. May need head CT. Start levophed now and increase MA[p 65 then re assess neurostatus, if not improved then Ct head.  Low threshold.  Bolus further. Place Aline, accuracy? Push cvp  Ccm 30 min   [name]. [name], MD, FACP Pgr: [phone] [place] [facility]

## 2012-02-06 NOTE — Consult Note (Signed)
[facility] CONSULT [facility]     Reason for Consult: Altered mental status with unresponsiveness   HPI:    Alexandra Wiley is an 66 y.o. 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 She was given Narcan which was of no benefit. She was intubated early this evening for airway protection. She was noted to have mild facial asymmetry. Possible acute stroke was a consideration. CT scan of her head showed no acute intracranial abnormality. MRI of her brain is pending. Patient has become progressively more responsive with eye opening to verbal stimulation. She has not been febrile. She has experienced hypotension which has required levofed.  Past Medical History  Diagnosis Date  . Ulcer   . Hypertension   . Heart murmur   . Gout   . PVD (peripheral vascular disease)   . Pain in limb   . CHF (congestive heart failure)   . 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 Conus medullaris syndrome   . Arthritis   . Breast cancer ~ 1975    right  . Carpal tunnel syndrome of right wrist   . High cholesterol   . Angina   . Myocardial infarction 1996  . Pneumonia ~ 2002; ~1975  . Type II diabetes mellitus   . Exertional dyspnea   . Hypothyroidism   . Seizures 02/02/12    "used to; a long time ago"  . Stroke 10/2008    "spinal cord"    Past Surgical History  Procedure Date  . Foot tendon surgery 2000    Left foot  . Tubal ligation   . Femur fracture surgery 1960's    Left leg, repair of non-union femur fracture by Dr. Orland Jarred  . Breast lumpectomy ~1975    Right breast carcinoma in situ  . Tonsillectomy and adenoidectomy ~ 1961  . Dilation and curettage of uterus   . Fracture surgery     Family History  Problem Relation Age of Onset  . Arthritis  Mother   . Hypertension Mother   . Heart disease Father   . Diabetes Father     Social History:  reports that she has been smoking Cigarettes.  She has a 12.5 pack-year smoking history. She has never used smokeless tobacco. She reports that she drinks alcohol. She reports that she does not use illicit drugs.  No Known Allergies  Medications:    Scheduled:   . allopurinol  100 mg Oral Daily  . antiseptic oral rinse  15 mL Mouth Rinse BID  . atorvastatin  10 mg Oral Daily  . baclofen  10 mg Oral BID  . etomidate      . etomidate  15 mg Intravenous Once  . fentaNYL      . fentaNYL  100 mcg Intravenous Once  . gabapentin  300 mg Oral QID  . heparin  5,000 Units Subcutaneous Q8H  . hetastarch in lactated electrolyte      . hydrocortisone sodium succinate  50 mg Intravenous Q6H  . insulin aspart  0-15 Units Subcutaneous TID WC  . insulin aspart protamine-insulin aspart  12 Units Subcutaneous Q supper  . levothyroxine  38 mcg Intravenous Daily  . midazolam  2 mg Intravenous Once  . naloxone      .  naloxone      . oxybutynin  10 mg Oral Daily  . pantoprazole (PROTONIX) IV  40 mg Intravenous QHS  . piperacillin-tazobactam (ZOSYN)  IV  3.375 g Intravenous Q8H  . propofol      . sodium chloride  1,000 mL Intravenous Once  . sodium chloride  500 mL Intravenous Once  . sodium chloride  500 mL Intravenous Once  . sodium chloride  750 mL Intravenous Once  . sodium chloride  3 mL Intravenous Q12H  . vancomycin  1,000 mg Intravenous Q12H  . DISCONTD: levothyroxine  75 mcg Oral Daily  . DISCONTD: mupirocin cream   Topical Daily  . DISCONTD: pantoprazole  40 mg Oral Q1200   Continuous:   . sodium chloride 75 mL/hr at 02/06/12 2020  . norepinephrine (LEVOPHED) Adult infusion 1.5 mcg/min (02/06/12 2124)  . propofol 10 mcg/kg/min (02/06/12 2014)  . DISCONTD: sodium chloride 20 mL/hr (02/05/12 0800)   ONG:[id number], fentaNYL, ondansetron (ZOFRAN) IV, DISCONTD: sodium chloride,  DISCONTD:  morphine injection, DISCONTD:  morphine injection, DISCONTD: naloxone (NARCAN) injection, DISCONTD: oxyCODONE   Blood pressure 129/51, pulse 99, temperature 99.3 F (37.4 C), temperature source Oral, resp. rate 18, height 4\' 11"  (1.499 m), weight 78.8 kg (173 lb 11.6 oz), SpO2 99.00%.   Neurologic Examination:  Mental Status: Intubated and on mechanical ventilation; on propofol drip for sedation; arousable briefly with verbal and tactile stimulation. Patient can follow simple commands.   Cranial Nerves: II-patient was not alert enough assess visual fields. III/IV/VI-Pupils were equal and reacted. Extraocular movements were full and conjugate.    V/VII-no facial weakness noted. VIII-normal.. Motor: Able to move both upper extremities proximally and distally with good hand grip bilaterally on command. She did not move her left lower extremity to command. Deep Tendon Reflexes: Absent throughout. Plantars: Mute on the left; AK amputation on the right.  Ct Head Wo Contrast  02/06/2012  *RADIOLOGY REPORT*  Clinical Data: Not waking up after surgery.  Rule out stroke.  CT HEAD WITHOUT CONTRAST  Technique:  Contiguous axial images were obtained from the base of the skull through the vertex without contrast.  Comparison: CT 11/18/2008  Findings: Age appropriate atrophy.  No acute infarct, hemorrhage, or mass.  Calvarium is intact.  IMPRESSION: No acute abnormality.  Original Report Authenticated By: Camelia Phenes, M.D.   Portable Chest Xray  02/06/2012  *RADIOLOGY REPORT*  Clinical Data: Endotracheal tube placement.  PORTABLE CHEST - 1 VIEW  Comparison: 02/05/2012  Findings: Endotracheal tube is noted with tip directed to the towards the right mainstem bronchus - recommend 2-3 cm retraction. A right IJ central venous catheters present with tip overlying the upper SVC. Pulmonary vascular congestion and mild left basilar atelectasis again noted. There is no evidence of pneumothorax.   IMPRESSION: Endotracheal tube directed towards the right mainstem bronchus - recommend 2-3 cm retraction.  No other significant changes identified.  These results were called to Maralyn Sago, R.N. on 02/05/2012 at 6:23 p.m.  Original Report Authenticated By: Rosendo Gros, M.D.   Dg Chest Port 1 View  02/05/2012  *RADIOLOGY REPORT*  Clinical Data: Follow-up respiratory failure  PORTABLE CHEST - 1 VIEW  Comparison: Chest radiograph 02/04/2012  Findings: Stable cardiac silhouette.  Right central venous line is unchanged.  Improvement in bibasilar atelectasis compared to prior. Similar central venous congestion.  No pneumothorax.  IMPRESSION: Improvement in basilar atelectasis.  Original Report Authenticated By: Genevive Bi, M.D.   Dg Abd Portable 1v  02/06/2012  *RADIOLOGY REPORT*  Clinical Data: Panda tube placement  PORTABLE ABDOMEN - 1 VIEW  Comparison: None.  Findings: Feeding tube is in the proximal stomach.  Mild ileus.  Gas is present in the stomach and colon.  Left lower lobe airspace disease  IMPRESSION: Feeding tube is in the proximal stomach.  Original Report Authenticated By: Camelia Phenes, M.D.     Assessment/Plan:  1. Altered mental status with encephalopathic state, improving. Likely etiologies include prolonged effects of anesthesia, analgesic medications and hypotension. 2. No clinical signs of acute stroke at this point.  Recommendations:  1. MRI of the brain without contrast is planned to rule out possible acute stroke as a contributing etiology to the patient's altered mental status. 2. Continued to minimize use of potentially sedating medications, including analgesics. 3. We'll order an EEG if there is no significant improvement in patient's level of alertness over the next 24-36 hours.  Venetia Maxon M.D. [facility] [phone]  02/06/2012, 9:56 PM

## 2012-02-06 NOTE — Procedures (Signed)
Tolerated well Hyperkalemia, acidosis shock, resp failure.[name]. [name], MD, FACP Pgr: [phone] [facility]

## 2012-02-06 NOTE — Evaluation (Signed)
Physical Therapy Evaluation Patient Details Name: Alexandra Wiley MRN: [id number] DOB: Aug 20, 1946 Today's Date: 02/06/2012 Time: 1191-4782 PT Time Calculation (min): 15 min  PT Assessment / Plan / Recommendation Clinical Impression  Patient s/p Right AKA with decr mobility secondary to decr endurance and lethargy.  Limited evaluation secondary to lethargy.  Will assess further hopefully on next visit.    PT Assessment  Patient needs continued PT services    Follow Up Recommendations   (TBA)    Barriers to Discharge  TBA      lEquipment Recommendations  Defer to next venue    Recommendations for Other Services OT consult   Frequency Min 3X/week    Precautions / Restrictions Precautions Precautions: Fall Restrictions Weight Bearing Restrictions: Yes Other Position/Activity Restrictions: RAKA   Pertinent Vitals/Pain Desat with activity and nursing aware, too lethargic to report pain      Mobility  Bed Mobility Bed Mobility: Supine to Sit;Sitting - Scoot to Edge of Bed Supine to Sit: 1: +2 Total assist;HOB flat Supine to Sit: Patient Percentage: 10% Sitting - Scoot to Edge of Bed: 1: +2 Total assist Sitting - Scoot to Edge of Bed: Patient Percentage: 0% Details for Bed Mobility Assistance: Patient very lethargic.  Tried to arouse patient by sitting her EOB.  Unable to arouse patient more than 5 seconds at a time.  Patient could not keep eyes open greater than 5 seconds at a time.   Transfers Transfers: Not assessed Ambulation/Gait Ambulation/Gait Assistance: Not tested (comment) Stairs: No Wheelchair Mobility Wheelchair Mobility: No         PT Diagnosis: Generalized weakness  PT Problem List: Decreased activity tolerance;Decreased balance;Decreased mobility;Decreased knowledge of use of DME;Decreased safety awareness;Decreased knowledge of precautions PT Treatment Interventions: Gait training;DME instruction;Functional mobility training;Therapeutic  activities;Therapeutic exercise;Balance training;Patient/family education;Wheelchair mobility training   PT Goals Acute Rehab PT Goals PT Goal Formulation: With patient Time For Goal Achievement: 02/20/12 Potential to Achieve Goals: Good Pt will go Supine/Side to Sit: with min assist;with rail PT Goal: Supine/Side to Sit - Progress: Goal set today Pt will Sit at Edge of Bed: with supervision;3-5 min;with bilateral upper extremity support PT Goal: Sit at Edge Of Bed - Progress: Goal set today Pt will go Sit to Stand: with min assist;with upper extremity assist PT Goal: Sit to Stand - Progress: Goal set today Pt will Transfer Bed to Chair/Chair to Bed: with min assist PT Transfer Goal: Bed to Chair/Chair to Bed - Progress: Goal set today Pt will Ambulate: 1 - 15 feet;with min assist;with least restrictive assistive device PT Goal: Ambulate - Progress: Goal set today Pt will Propel Wheelchair: > 150 feet;with supervision;with cues (comment type and amount) PT Goal: Propel Wheelchair - Progress: Goal set today  Visit Information  Last PT Received On: 02/06/12 Assistance Needed: +2    Subjective Data  Subjective: Patient very lethargic.  Nursing states that patient has had a lot of morphine.  Nursing wanted PT to try to get her OOB.   Patient Stated Goal: Unable to state secondary to lethargy   Prior Functioning  Home Living Additional Comments: Unable to assess secondary to patient too lethargic    Cognition  Overall Cognitive Status: Difficult to assess Difficult to assess due to: Level of arousal Arousal/Alertness: Lethargic Behavior During Session: Lethargic    Extremity/Trunk Assessment Right Upper Extremity Assessment RUE ROM/Strength/Tone: Unable to fully assess Left Upper Extremity Assessment LUE ROM/Strength/Tone: Unable to fully assess Right Lower Extremity Assessment RLE ROM/Strength/Tone: Unable to fully assess  Left Lower Extremity Assessment LLE ROM/Strength/Tone:  Unable to fully assess   Balance Balance Balance Assessed: Yes Static Sitting Balance Static Sitting - Balance Support: No upper extremity supported;Feet supported Static Sitting - Level of Assistance: 1: +2 Total assist (pt = 10%) Static Sitting - Comment/# of Minutes: 3 minutes at EOB.  Patient would attempt for 5 second intervals to sit upright but too lethargic to maintain sitting.  Assisted patient back to supine position and notified nursing that patient too lethargic to participate.    End of Session PT - End of Session Activity Tolerance: Treatment limited secondary to medication (limited by lethargy) Patient left: in bed;with call bell/phone within reach;with nursing in room Nurse Communication: Need for lift equipment (Nursing notified that patient desat with activity)   INGOLD,Finnis Colee 02/06/2012, 10:49 AM  [facility] [phone] (pager)

## 2012-02-06 NOTE — Progress Notes (Signed)
Family Medicine Teaching Service Daily Progress Note: [id number] Subjective: S/p right AKA yesterday. Patient has been drowsy with pain medications.  Objective: Vital signs in last 24 hours: Temp:  [97.8 F (36.6 C)-99.4 F (37.4 C)] 98.4 F (36.9 C) (05/18 0754) Pulse Rate:  [79-125] 113  (05/18 1100) Resp:  [10-24] 13  (05/18 1100) BP: (66-137)/(41-91) 98/47 mmHg (05/18 1100) SpO2:  [91 %-99 %] 98 % (05/18 1100) Weight:  [173 lb 11.6 oz (78.8 kg)] 173 lb 11.6 oz (78.8 kg) (05/18 0600) Weight change:  Last BM Date: 02/03/12  Intake/Output from previous day: 05/17 0701 - 05/18 0700 In: 2044 [I.V.:1494; IV Piggyback:550] Out: 2655 [Urine:2580; Blood:75] Intake/Output this shift: Total I/O In: 757.5 [I.V.:20; IV Piggyback:737.5] Out: 135 [Urine:135] Constitutional: somnolent, wakes up when talked to but goes right back to sleep. Did not react much with stick of ABG. Cardiovascular: Normal rate and regular rhythm.  3/6 systolic murmur best heard at the right sternal border  Respiratory: RR:13 with O2:98% on 3L, clear to ausculation anteriorly GI: Soft. She exhibits no distension. There is no tenderness. There is no guarding.  Neurological: She is alert and oriented to person, place, and time.  Extremities: bandage clean and dry on right stump.  Dressing on left heal. Lab Results:  [facility] 02/06/12 0511 02/05/12 0520  WBC 15.5* 12.8*  HGB 8.6* 9.5*  HCT 26.7* 28.9*  PLT 375 399   BMET  Basename 02/06/12 0511 02/05/12 0520  NA 136 139  K 4.0 3.8  CL 101 105  CO2 27 27  GLUCOSE 94 95  BUN 5* 4*  CREATININE 1.25* 0.72  CALCIUM 8.3* 8.7    Studies/Results: Dg Chest Port 1 View  02/05/2012  *RADIOLOGY REPORT*  Clinical Data: Follow-up respiratory failure  PORTABLE CHEST - 1 VIEW  Comparison: Chest radiograph 02/04/2012  Findings: Stable cardiac silhouette.  Right central venous line is unchanged.  Improvement in bibasilar atelectasis compared to prior. Similar central  venous congestion.  No pneumothorax.  IMPRESSION: Improvement in basilar atelectasis.  Original Report Authenticated By: Genevive Bi, M.D.    Medications:  Scheduled:    . allopurinol  100 mg Oral Daily  . antiseptic oral rinse  15 mL Mouth Rinse BID  . atorvastatin  10 mg Oral Daily  . baclofen  10 mg Oral BID  . gabapentin  300 mg Oral QID  . heparin  5,000 Units Subcutaneous Q8H  . hetastarch in lactated electrolyte  500 mL Intravenous Once  . hetastarch in lactated electrolyte      . insulin aspart  0-15 Units Subcutaneous TID WC  . insulin aspart protamine-insulin aspart  12 Units Subcutaneous Q supper  . levothyroxine  75 mcg Oral Daily  . mupirocin cream   Topical Daily  . oxybutynin  10 mg Oral Daily  . pantoprazole  40 mg Oral Q1200  . piperacillin-tazobactam (ZOSYN)  IV  3.375 g Intravenous Q8H  . sodium chloride  500 mL Intravenous Once  . sodium chloride  3 mL Intravenous Q12H  . 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 Currently on CCM service due  to concern for sepsis.   # sepsis in the setting of left posterior calcaneal osteomyelitis:  S/p right AKA. Post op day 1.  on sepsis protocol. Lactic acid and procalcitonin were normal.  - blood cultures negative to date - continue vanc and zosyn (start 05/14)   # acute encephalopathy: likely due to pain medication - ABG obtained and normal pH - per CCM, holding on narcan for now - decreasing  morphine dose.   # Peripheral vascular disease:  - continue to monitor left heal and foot  - follow up vascular surgery recommendations for potential revascularization of left leg in the future.   # Acute renal insufficiency: likely from volume shifts during surgery. - follow up morning BMP  # Diabetes: on NPH 70/30 12 u bid at home. She reports CBG's in the 200 range. A1C of 10.2.  CBG (last 3)   Basename 02/06/12 0730 02/06/12 0030 02/05/12 2015  GLUCAP 113* 102* 93   - continue NPH 70/30 12 u qhs  - sliding scale coverage  # h/o spinal cord infarct:  - continue home meds for spasms and pain: baclofen 10bid, gabapentin 300 qid, methocarbamol  - unclear why she is on oxybutinin since she has urinary retention. Will d/c nitrofurantoi for now since on vanc and zosyn.  # diastolic CHF grade 2 diastolic dysfunction with EF of 60-65% on May 2012 echo No evidence of CHF exacerbation # h/o gout:  Resume home allopurinol  - check uric acid # h/o HTN:  - BP meds held in the setting of hypotension #h/o hypothyroid:  - continue home levothyroxine  - f/u TSH PPx:  - heparin sub q  Fen/Gi: npo Dispo: currently on CCM's service. May be transferred back to FPTS in PM or tomorrow. Greatly appreciate CCM's care. Will continue to follow.    LOS: 4 days   Yakub Lodes 02/06/2012, 11:45 AM

## 2012-02-06 NOTE — Progress Notes (Signed)
CRITICAL VALUE ALERT  Critical value received:  Troponin- 0.04  Date of notification:  02/06/12  Time of notification:  2050  Critical value read back:yes  Nurse who received alert:  Marcellina Millin, RN  MD notified (1st page):  Dr. Marin Shutter  Time of first page:  2050  MD notified (2nd page):  Time of second page:  Responding MD:  Dr. Marin Shutter  Time MD responded:  Dr. Marin Shutter

## 2012-02-06 NOTE — Procedures (Signed)
Intubation Procedure Note Alexandra Wiley [id number] 1945/10/29  Procedure: Intubation Indications: Airway protection and maintenance  Procedure Details Consent: Risks of procedure as well as the alternatives and risks of each were explained to the (patient/caregiver).  Consent for procedure obtained. Time Out: Verified patient identification, verified procedure, site/side was marked, verified correct patient position, special equipment/implants available, medications/allergies/relevent history reviewed, required imaging and test results available.  Performed  Maximum sterile technique was used including gloves, gown and hand hygiene.  MAC and 3    Evaluation Hemodynamic Status: BP stable throughout; O2 sats: stable throughout Patient's Current Condition: stable Complications: No apparent complications Patient did tolerate procedure well. Chest X-ray ordered to verify placement.  CXR: pending.   Alexandra Wiley. 02/06/2012  Airway with some food particles throughout or yellowpills, no overt asp.  Alexandra Wiley. Alexandra Alias, MD, FACP Pgr: [phone] [facility] D/w son prior

## 2012-02-06 NOTE — Progress Notes (Signed)
VASCULAR PROGRESS NOTE  SUBJECTIVE: pain adequately controlled  PHYSICAL EXAM: Filed Vitals:   02/06/12 0700 02/06/12 0754 02/06/12 0800 02/06/12 0900  BP: 99/44  96/44 95/53  Pulse: 100  102 125  Temp:  98.4 F (36.9 C)    TempSrc:  Oral    Resp: 13  14 20   Height:      Weight:      SpO2: 96%  94% 91%   Lungs: clear to auscultation Dressing on her right AKA clean and dry.  LABS: Lab Results  Component Value Date   WBC 15.5* 02/06/2012   HGB 8.6* 02/06/2012   HCT 26.7* 02/06/2012   MCV 82.9 02/06/2012   PLT 375 02/06/2012   Lab Results  Component Value Date   CREATININE 1.25* 02/06/2012   Lab Results  Component Value Date   INR 1.11 02/05/2012   CBG (last 3)   Basename 02/06/12 0730 02/06/12 0030 02/05/12 2015  GLUCAP 113* 102* 93     ASSESSMENT/PLAN: 1. 1 Day Post-Op s/p: Right AKA 2. Dressing change tomorrow  Waverly Ferrari, MD, FACS Beeper: [phone] 02/06/2012

## 2012-02-06 NOTE — Procedures (Signed)
Intubation Procedure Note Alexandra Wiley [id number] [birth year]  Procedure: Intubation Indications: Airway protection and maintenance  Procedure Details Consent: Risks of procedure as well as the alternatives and risks of each were explained to the (patient/caregiver).  Consent for procedure obtained. Time Out: Verified patient identification, verified procedure, site/side was marked, verified correct patient position, special equipment/implants available, medications/allergies/relevent history reviewed, required imaging and test results available.  Performed  Maximum sterile technique was used including cap, gloves and mask.  MAC and 3    Evaluation Hemodynamic Status: BP stable throughout; O2 sats: stable throughout Patient's Current Condition: stable Complications: No apparent complications Patient did tolerate procedure well. Chest X-ray ordered to verify placement.  CXR: pending.   Kelon Easom,PETE 02/06/2012

## 2012-02-07 ENCOUNTER — Inpatient Hospital Stay (HOSPITAL_COMMUNITY): Payer: Medicare Other

## 2012-02-07 DIAGNOSIS — R404 Transient alteration of awareness: Secondary | ICD-10-CM

## 2012-02-07 DIAGNOSIS — R197 Diarrhea, unspecified: Secondary | ICD-10-CM

## 2012-02-07 DIAGNOSIS — R569 Unspecified convulsions: Secondary | ICD-10-CM

## 2012-02-07 LAB — CBC
HCT: 25.7 % — ABNORMAL LOW (ref 36.0–46.0)
MCH: 26.6 pg (ref 26.0–34.0)
MCV: 83.4 fL (ref 78.0–100.0)
Platelets: 338 10*3/uL (ref 150–400)
RBC: 3.08 MIL/uL — ABNORMAL LOW (ref 3.87–5.11)

## 2012-02-07 LAB — BASIC METABOLIC PANEL
BUN: 10 mg/dL (ref 6–23)
CO2: 24 mEq/L (ref 19–32)
Calcium: 7.6 mg/dL — ABNORMAL LOW (ref 8.4–10.5)
Creatinine, Ser: 2.19 mg/dL — ABNORMAL HIGH (ref 0.50–1.10)
Glucose, Bld: 189 mg/dL — ABNORMAL HIGH (ref 70–99)

## 2012-02-07 LAB — URINALYSIS, MICROSCOPIC ONLY
Nitrite: NEGATIVE
Specific Gravity, Urine: 1.016 (ref 1.005–1.030)
Urobilinogen, UA: 1 mg/dL (ref 0.0–1.0)
pH: 5.5 (ref 5.0–8.0)

## 2012-02-07 LAB — SODIUM, URINE, RANDOM: Sodium, Ur: 31 mEq/L

## 2012-02-07 LAB — GLUCOSE, CAPILLARY
Glucose-Capillary: 179 mg/dL — ABNORMAL HIGH (ref 70–99)
Glucose-Capillary: 102 mg/dL — ABNORMAL HIGH (ref 70–99)

## 2012-02-07 LAB — CARDIAC PANEL(CRET KIN+CKTOT+MB+TROPI)
Relative Index: 0.7 (ref 0.0–2.5)
Total CK: 443 U/L — ABNORMAL HIGH (ref 7–177)

## 2012-02-07 LAB — CREATININE, URINE, RANDOM: Creatinine, Urine: 76.98 mg/dL

## 2012-02-07 LAB — UREA NITROGEN, URINE: Urea Nitrogen, Ur: 204 mg/dL

## 2012-02-07 MED ORDER — VANCOMYCIN HCL IN DEXTROSE 1-5 GM/200ML-% IV SOLN
1000.0000 mg | INTRAVENOUS | Status: DC
  Administered 2012-02-08 – 2012-02-10 (×3): 1000 mg via INTRAVENOUS
  Filled 2012-02-07 (×4): qty 200

## 2012-02-07 MED ORDER — CHLORHEXIDINE GLUCONATE 0.12 % MT SOLN
15.0000 mL | Freq: Two times a day (BID) | OROMUCOSAL | Status: DC
  Administered 2012-02-07 – 2012-02-09 (×6): 15 mL via OROMUCOSAL
  Filled 2012-02-07 (×6): qty 15

## 2012-02-07 MED ORDER — SODIUM CHLORIDE 0.9 % IV BOLUS (SEPSIS)
500.0000 mL | Freq: Once | INTRAVENOUS | Status: AC
  Administered 2012-02-07: 500 mL via INTRAVENOUS

## 2012-02-07 MED ORDER — PIPERACILLIN-TAZOBACTAM IN DEX 2-0.25 GM/50ML IV SOLN
2.2500 g | Freq: Three times a day (TID) | INTRAVENOUS | Status: DC
  Administered 2012-02-07 – 2012-02-08 (×3): 2.25 g via INTRAVENOUS
  Filled 2012-02-07 (×5): qty 50

## 2012-02-07 NOTE — Progress Notes (Signed)
ANTIBIOTIC CONSULT NOTE - FOLLOW UP  Pharmacy Consult for Vanco/zosyn Indication: Osteo  No Known Allergies  Patient Measurements: Height: 4\' 11"  (149.9 cm) Weight: 178 lb 5.6 oz (80.9 kg) IBW/kg (Calculated) : 43.2  Adjusted Body Weight:    Vital Signs: Temp: 98 F (36.7 C) (05/19 0805) Temp src: Oral (05/19 0805) BP: 116/51 mmHg (05/19 0800) Pulse Rate: 57  (05/19 0800) Intake/Output from previous day: 05/18 0701 - 05/19 0700 In: 4764.3 [I.V.:1374.3; NG/GT:90; IV Piggyback:3300] Out: 1840 [Urine:1840] Intake/Output from this shift: Total I/O In: 1035 [I.V.:275; IV Piggyback:760] Out: 230 [Urine:230]  Labs:  [facility] 02/07/12 0955 02/07/12 0320 02/06/12 1230 02/06/12 0511 02/05/12 0520  WBC -- 21.2* 17.0* 15.5* --  HGB -- 8.2* 8.0* 8.6* --  PLT -- 338 353 375 --  LABCREA 76.98 -- -- -- --  CREATININE -- 2.19* -- 1.25* 0.72   Estimated Creatinine Clearance: 22.6 ml/min (by C-G formula based on Cr of 2.19).  Basename 02/05/12 0834  VANCOTROUGH 15.8  VANCOPEAK --  VANCORANDOM --  GENTTROUGH --  GENTPEAK --  GENTRANDOM --  TOBRATROUGH --  TOBRAPEAK --  TOBRARND --  AMIKACINPEAK --  AMIKACINTROU --  AMIKACIN --     Microbiology: Recent Results (from the past 720 hour(s))  MRSA PCR SCREENING     Status: Normal   Collection Time   02/03/12  6:10 PM      Component Value Range Status Comment   MRSA by PCR NEGATIVE  NEGATIVE  Final   CULTURE, BLOOD (ROUTINE X 2)     Status: Normal (Preliminary result)   Collection Time   02/03/12  6:15 PM      Component Value Range Status Comment   Specimen Description BLOOD CENTRAL LINE   Final    Special Requests     Final    Value: BOTTLES DRAWN AEROBIC AND ANAEROBIC 10CC LEFT IJ CVC   Culture  Setup Time [id number]   Final    Culture     Final    Value:        BLOOD CULTURE RECEIVED NO GROWTH TO DATE CULTURE WILL BE HELD FOR 5 DAYS BEFORE ISSUING A FINAL NEGATIVE REPORT   Report Status PENDING   Incomplete   URINE  CULTURE     Status: Normal   Collection Time   02/03/12  6:20 PM      Component Value Range Status Comment   Specimen Description URINE, CATHETERIZED   Final    Special Requests NONE   Final    Culture  Setup Time [id number]   Final    Colony Count NO GROWTH   Final    Culture NO GROWTH   Final    Report Status 02/05/2012 FINAL   Final   CULTURE, BLOOD (ROUTINE X 2)     Status: Normal (Preliminary result)   Collection Time   02/03/12  7:58 PM      Component Value Range Status Comment   Specimen Description BLOOD HAND RIGHT   Final    Special Requests BOTTLES DRAWN AEROBIC ONLY 3CC   Final    Culture  Setup Time [id number]   Final    Culture     Final    Value:        BLOOD CULTURE RECEIVED NO GROWTH TO DATE CULTURE WILL BE HELD FOR 5 DAYS BEFORE ISSUING A FINAL NEGATIVE REPORT   Report Status PENDING   Incomplete     Anti-infectives     Start  Dose/Rate Route Frequency Ordered Stop   02/03/12 2000   vancomycin (VANCOCIN) IVPB 1000 mg/200 mL premix        1,000 mg 200 mL/hr over 60 Minutes Intravenous Every 12 hours 02/03/12 0919     02/03/12 1730   piperacillin-tazobactam (ZOSYN) IVPB 3.375 g  Status:  Discontinued        3.375 g 100 mL/hr over 30 Minutes Intravenous  Once 02/03/12 1723 02/03/12 1725   02/03/12 1730   vancomycin (VANCOCIN) IVPB 1000 mg/200 mL premix  Status:  Discontinued        1,000 mg 200 mL/hr over 60 Minutes Intravenous  Once 02/03/12 1723 02/03/12 1725   02/03/12 0100  piperacillin-tazobactam (ZOSYN) IVPB 3.375 g       3.375 g 12.5 mL/hr over 240 Minutes Intravenous Every 8 hours 02/02/12 2233     02/02/12 1700   vancomycin (VANCOCIN) 1,250 mg in sodium chloride 0.9 % 250 mL IVPB  Status:  Discontinued        1,250 mg 166.7 mL/hr over 90 Minutes Intravenous Every 12 hours 02/02/12 1613 02/03/12 0919   02/02/12 1545  piperacillin-tazobactam (ZOSYN) 3.375 g in dextrose 5 % 50 mL IVPB       3.375 g 100 mL/hr over 30 Minutes Intravenous  Once  02/02/12 1530 02/02/12 1937          Assessment: Right foot sore  Infectious Disease: Tmax 99.3, WBC 21.2 up. Foot MRI shows calcaneal osteo and low level fascitis. Vanco trough this am 15.8 in goal. 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 SCr way up to 2.  Hx of paraplegia, CrCl may be overestimated, UOP 1.4 down to 0.9 ml/kg/hr. D/c'd ditropan 5/19 due to renal function  Goal of Therapy:  Vanco trough 15-20 for osteo  Plan:  Vanco 1g change to q24h. Change Zosyn to 2.25g IV q8h. Watch SCR! F/u cultures.  Merilynn Finland, [employer] 02/07/2012,2:56 PM

## 2012-02-07 NOTE — Progress Notes (Signed)
FMTS Attending Note  Patient seen and examined by me on 5/18, somnolent and with difficulty to arouse. Discussed case with resident team, ICU team. Paula Compton, MD

## 2012-02-07 NOTE — Progress Notes (Signed)
Family Medicine Teaching Service Daily Progress Note: [id number] Subjective: S/p right AKA POD#2. Patient was drowsy yesterday and became unresponsive. Was intubated by CCM service.   Objective: Vital signs in last 24 hours: Temp:  [98 F (36.7 C)-99.3 F (37.4 C)] 98 F (36.7 C) (05/19 0805) Pulse Rate:  [56-138] 57  (05/19 0800) Resp:  [12-24] 12  (05/19 0800) BP: (82-151)/(34-96) 116/51 mmHg (05/19 0800) SpO2:  [91 %-100 %] 100 % (05/19 0800) FiO2 (%):  [39.8 %-60.3 %] 39.9 % (05/19 0800) Weight:  [178 lb 5.6 oz (80.9 kg)] 178 lb 5.6 oz (80.9 kg) (05/19 0600) Weight change: 4 lb 10.1 oz (2.1 kg) Last BM Date: 02/05/12  Intake/Output from previous day: 05/18 0701 - 05/19 0700 In: 4764.3 [I.V.:1374.3; NG/GT:90; IV Piggyback:3300] Out: 1840 [Urine:1840] Intake/Output this shift: Total I/O In: 275 [I.V.:75; IV Piggyback:200] Out: 100 [Urine:100] Constitutional: Intubated and sedated Cardiovascular: Normal rate and regular rhythm.  3/6 systolic murmur best heard at the right sternal border  Respiratory: Vent sounds.  GI: Soft. She exhibits no distension. There is no tenderness. There is no guarding.  Neurological: sedated Extremities: bandage clean and dry on right stump.  Dressing on left heal. Lab Results:  Basename 02/07/12 0320 02/06/12 1230  WBC 21.2* 17.0*  HGB 8.2* 8.0*  HCT 25.7* 24.3*  PLT 338 353   BMET  Basename 02/07/12 0320 02/06/12 0511  NA 140 136  K 4.1 4.0  CL 108 101  CO2 24 27  GLUCOSE 189* 94  BUN 10 5*  CREATININE 2.19* 1.25*  CALCIUM 7.6* 8.3*    Studies/Results: Ct Head Wo Contrast  02/06/2012  *RADIOLOGY REPORT*  Clinical Data: Not waking up after surgery.  Rule out stroke.  CT HEAD WITHOUT CONTRAST  Technique:  Contiguous axial images were obtained from the base of the skull through the vertex without contrast.  Comparison: CT 11/18/2008  Findings: Age appropriate atrophy.  No acute infarct, hemorrhage, or mass.  Calvarium is intact.   IMPRESSION: No acute abnormality.  Original Report Authenticated By: Camelia Phenes, M.D.   Portable Chest Xray  02/06/2012  *RADIOLOGY REPORT*  Clinical Data: Endotracheal tube placement.  PORTABLE CHEST - 1 VIEW  Comparison: 02/05/2012  Findings: Endotracheal tube is noted with tip directed to the towards the right mainstem bronchus - recommend 2-3 cm retraction. A right IJ central venous catheters present with tip overlying the upper SVC. Pulmonary vascular congestion and mild left basilar atelectasis again noted. There is no evidence of pneumothorax.  IMPRESSION: Endotracheal tube directed towards the right mainstem bronchus - recommend 2-3 cm retraction.  No other significant changes identified.  These results were called to Maralyn Sago, R.N. on 02/05/2012 at 6:23 p.m.  Original Report Authenticated By: Rosendo Gros, M.D.   Dg Abd Portable 1v  02/06/2012  *RADIOLOGY REPORT*  Clinical Data: Panda tube placement  PORTABLE ABDOMEN - 1 VIEW  Comparison: None.  Findings: Feeding tube is in the proximal stomach.  Mild ileus.  Gas is present in the stomach and colon.  Left lower lobe airspace disease  IMPRESSION: Feeding tube is in the proximal stomach.  Original Report Authenticated By: Camelia Phenes, M.D.    Medications:  Scheduled:    . allopurinol  100 mg Oral Daily  . antiseptic oral rinse  15 mL Mouth Rinse BID  . atorvastatin  10 mg Oral Daily  . baclofen  10 mg Oral BID  . chlorhexidine  15 mL Mouth/Throat BID  . etomidate      .  etomidate  15 mg Intravenous Once  . fentaNYL      . fentaNYL  100 mcg Intravenous Once  . gabapentin  300 mg Oral QID  . heparin  5,000 Units Subcutaneous Q8H  . hydrocortisone sodium succinate  50 mg Intravenous Q6H  . insulin aspart  0-15 Units Subcutaneous TID WC  . insulin aspart protamine-insulin aspart  12 Units Subcutaneous Q supper  . levothyroxine  38 mcg Intravenous Daily  . midazolam  2 mg Intravenous Once  . naloxone      . naloxone      .  oxybutynin  10 mg Oral Daily  . pantoprazole (PROTONIX) IV  40 mg Intravenous QHS  . piperacillin-tazobactam (ZOSYN)  IV  3.375 g Intravenous Q8H  . propofol      . sodium chloride  1,000 mL Intravenous Once  . sodium chloride  500 mL Intravenous Once  . sodium chloride  500 mL Intravenous Once  . sodium chloride  750 mL Intravenous Once  . sodium chloride  3 mL Intravenous Q12H  . vancomycin  1,000 mg Intravenous Q12H  . DISCONTD: levothyroxine  75 mcg Oral Daily  . DISCONTD: mupirocin cream   Topical Daily  . 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 Currently on CCM service due to concern for sepsis. Was re-intubated yesterday because of unresponsiveness, decreased sensorium.   # sepsis in the setting of left posterior calcaneal osteomyelitis:  S/p right AKA. Post op day 2.  on sepsis protocol. Lactic acid and procalcitonin were normal.  - blood cultures negative to date - continue vanc and zosyn (start 05/14)   # acute encephalopathy:  CCM managing. Patient intubated and sedated for airway protection. Neurology on board.   # Peripheral vascular disease:  - continue to monitor left heal and foot  - follow up vascular surgery recommendations for potential revascularization of left leg in the future.   # Acute renal insufficiency: likely from volume shifts during surgery.  # Diabetes: on NPH 70/30 12 u bid at home. She reports CBG's in the 200 range. A1C of 10.2.  CBG (last 3)   Basename 02/07/12 0737 02/07/12 0324 02/06/12 1537  GLUCAP 158* 179* 135*   -  continue NPH 70/30 12 u qhs  - sliding scale coverage  # h/o spinal cord infarct:  - continue home meds for spasms and pain: baclofen 10bid, gabapentin 300 qid, methocarbamol  # diastolic CHF grade 2 diastolic dysfunction with EF of 60-65% on May 2012 echo No evidence of CHF exacerbation # h/o gout:  allopurinol  # h/o HTN:  - BP meds held in the setting of hypotension #h/o hypothyroid:  - continue home levothyroxine  - f/u TSH PPx:  - heparin sub q  PPI Fen/Gi: npo Dispo: currently on CCM's service.    LOS: 5 days   Suzannah Bettes  MD 02/07/2012, 8:54 AM

## 2012-02-07 NOTE — Progress Notes (Signed)
Name: Alexandra Wiley MRN: [id number] DOB: [birth year]    LOS: 5  [facility] Critical Care Note   History of Present Illness:  66 y/o BF, smoker with PMH of HTN, Gout, PVD, CHF, DM on insulin, spinal cord infarct (?walks with walker, neurogenic bladder) admitted on 5/14 by FPTS with a 3 week history of ulceration on R medial metatarsophalangeal joint area and heel.   Episode of asymptomatic hypotension 5/15 prompted transfer to ICU and concern for sepsis.    Lines / Drains: IJ TLC 5/15>>> ETT 5/18>>>  Cultures: 5/15 BCx2>>> 5/15 Sputum>>> 5/15 UC>>>neg  Antibiotics: 5/14 Vanco (osteo)>>> 5/14 Zosyn (oseto)>>>  Tests / Events: 5/14 Foot XRAY>>>Significant soft tissue swelling diffusely in right foot with osseous demineralization. Suspected area of bone destruction at the posterior margin of the calcaneus deep to the known wound suspicious for osteomyelitis. If further imaging is required recommend MR imaging with and without contrast. 5/14 R FOOT MRI>>>Posterior calcaneal osteomyelitis with adjacent cellulitis.  Suspected low-level fasciitis tracking along the margins of the plantar fascia. Dorsal subcutaneous edema in the foot probably represents cellulitis given the low-level associated enhancement.  No drainable abscess observed  5/18 CT head: Negative 5/19 MRI brain>>>  SUBJ:    Intubated yesterday PM for airway protection  Vital Signs: Temp:  [98 F (36.7 C)-99.3 F (37.4 C)] 98 F (36.7 C) (05/19 0805) Pulse Rate:  [71-138] 78  (05/19 0727) Resp:  [13-24] 15  (05/19 0727) BP: (82-151)/(34-96) 93/46 mmHg (05/19 0727) SpO2:  [91 %-100 %] 100 % (05/19 0600) FiO2 (%):  [39.8 %-60.3 %] 40 % (05/19 0727) Weight:  [80.9 kg (178 lb 5.6 oz)] 80.9 kg (178 lb 5.6 oz) (05/19 0600) I/O last 3 completed shifts: In: 5156.8 [I.V.:1479.3; NG/GT:90; IV Piggyback:3587.5] Out: 2195 [Urine:2195]  Physical Examination: General:chronically ill lethargic elderly female Neuro:  difficult to arouse, does follow comands CV: bradycardia, 2/6 murmur  PULM:lungs bilaterally coarse, diminished GI: round/soft, bsx4 active Extremities: cool/dry, chronic LE venous changes s/p R BKA  Labs    CBC  Lab 02/07/12 0320 02/06/12 1230 02/06/12 0511  HGB 8.2* 8.0* 8.6*  HCT 25.7* 24.3* 26.7*  WBC 21.2* 17.0* 15.5*  PLT 338 353 375   BMET  Lab 02/07/12 0320 02/06/12 0511 02/05/12 0520 02/04/12 0400 02/03/12 1825  NA 140 136 139 134* 138  K 4.1 4.0 -- -- --  CL 108 101 105 102 104  CO2 24 27 27 24 26   GLUCOSE 189* 94 95 156* 107*  BUN 10 5* 4* 6 9  CREATININE 2.19* 1.25* 0.72 0.62 0.77  CALCIUM 7.6* 8.3* 8.7 8.1* 8.8  MG -- -- -- 1.5 1.6  PHOS -- -- -- 3.3 3.5    Lab 02/05/12 0520 02/03/12 1825  INR 1.11 1.16     Lab 02/06/12 1021 02/03/12 2130 02/03/12 1910  PHART 7.375 -- --  PCO2ART 45.1* -- --  PO2ART 74.0* -- --  HCO3 26.4* -- --  TCO2 28 -- --  O2SAT 94.0 71.5 61.0    Radiology: CXR: bilateral atelectasis 5/19, rt main intub ( will alter placement)  Assessment and Plan: A:Sepsis secondary to osteomyelitis of right foot -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 Plan:  -F/U cx results -Cont Vanc and zosyn. - assess bone culture if done -some concern aspiration, but not the diving force of yesterday events  A: SIRS, hypovolemia, severe spesis P: -monitor CVP, maintain 8-12 -monitor UOP -low threshold A line -bolus further -lactic acid was wnl yesterday  SHOCK Resolved  off pressors Volume mostly hypovolemia picture Lactic was wnl  A: Acute renal failure sCr doubled over last 24 hours, unclear if this is due to intermittent hypotension, or sepsis  Likely ATN, likely also hypovolemia contribution P: -check fena, urine bicarb, urea -urine osm, na -cvp trend noted, bolus further  A: intermittent Bradycardia  P: -Tele -monitor  A: Acute Encephalopathy secondary to unclear, likely this is multifactorial, sepsis, hypovolemia, narcotics, uremia, r/o  CVA P: -MRI brain per neurology, appreciated -f/u neuro recs -eeg today -ct neg  A: Leukocytosis P: hemoconcentration likely cause -Continue ABX -follow OR results, repeat BC -UA  A: Hx of HTN / CHF P: -hold anti hypertensives   A: Anemia- no indication for PRBCs at this time P: -monitor  A: Conus medullaris syndrome Plan:  -Per primary (family medicine)  A: DM Plan:  -Cont SSI - ACHS  A: Hx of Gout:  P: -monitor.   BEST PRACTICE / DISPOSITION  - Level of Care: icu  - Primary Service: pccm  - Consultants: vascular surg, neuro - Code Status: full  - Diet: NPO - DVT Px: heparin SQ - GI Px: ppi  - Social / Family: no family available   Ccm 30 min   [name]. [name], MD, FACP Pgr: [phone] [facility]

## 2012-02-07 NOTE — Progress Notes (Addendum)
History: Hospitalized 02/02/12 due to Right foot calcaneal osteomyelitis and concern for sepsis.  Treated with IV vanc and Zosyn.  02/03/12 had hypotension pre-op requiring dobutamine drip and ICU level of care. Drip stopped and pressures remained 94-143/39-53 and HR: 62-95.  Underwent right AKA 02/05/12.  Op reports indicate that patient remained hemodynamically stable and was breathing spontaneously post-op.  There were no apparent anesthesia complications.  At 1538 5/17, BP 115/86, pulse 104.  Received first dose dilaudid 0.5 mg @ 1541. Developed hypotension @1600  5/17; 66/41, HR 99, RR13, Sao2 93%.  BP improved at 1624 to128/65 HR 95. Second dose of Dilaudid 0.5 mg @ 1627. Additional medication for pain on Friday with MS04 2 mg @ 1741, 1923, 2031, 2057, 2222, and oxycodone 5 mg @1927 . At 2000 5/17, patient was reported to be alert, following commands, with clear speech and following commands; reported to be able to make needs known, requesting and received pain meds. Friday evening family tried feeding patient food, but only ate about 5 mouthfuls.   SBP remained between 100-120 until 0700 5/18, when it began to lower into the 80's. Nursing shift assessment 02/06/12 at 0800, 1130 and 1600: patient was alert, with clear speech, following commands. Levophed gtt started @ 1234 02/06/12 due to hypotension.  Narcan x 2 1720, 1730, , Intubated @ 1740 for airway protection as patient remained obtunded. (Detailed events: Saturday am, nursing found patient obtunded, with resp. Rate 8 and hypotensive.  PH 7.43 . Received Narcan x 2 without change.  Required norepinephrine and IVF resuscitation. Pupils 2-3 brisk, but pt obtunded.  No seizure activity noted.  Head CT was negative).  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 Per family report, patient was recently able to ambulate short distances in  her home within the last month or two.      Subjective: Historical data provided by CCM physician, nurse and family as patient intubated and somnolent: Nurse reports right facial droop yesterday am prior to intubation.  Family did feed patient Friday night after surgery and food was in posterior oropharynx when intubated despite deep suction.  Family also relates history of seizure disorder, last seizure in 4s. Not on meds for seizure.  This AM, patient following some commands, yesterday did not.   Objective: BP 116/51  Pulse 57  Temp(Src) 98 F (36.7 C) (Oral)  Resp 12  Ht 4\' 11"  (1.499 m)  Wt 80.9 kg (178 lb 5.6 oz)  BMI 36.02 kg/m2  SpO2 100%  CBGs  Basename 02/07/12 1311 02/07/12 0737 02/07/12 0324 02/06/12 1537 02/06/12 1151 02/06/12 0730 02/06/12 0030 02/05/12 2015 02/05/12 1630 02/05/12 1507  GLUCAP 133* 158* 179* 135* 134* 113* 102* 93 99 100*   Medications: Scheduled:    . antiseptic oral rinse  15 mL Mouth Rinse BID  . atorvastatin  10 mg Oral Daily  . baclofen  10 mg Oral BID  . chlorhexidine  15 mL Mouth/Throat BID  . etomidate      . etomidate  15 mg Intravenous Once  . fentaNYL      . fentaNYL  100 mcg Intravenous Once  . heparin  5,000 Units Subcutaneous Q8H  . hydrocortisone sodium succinate  50 mg Intravenous Q6H  . insulin aspart  0-15 Units Subcutaneous TID WC  . insulin aspart protamine-insulin aspart  12 Units Subcutaneous Q supper  . levothyroxine  38 mcg Intravenous Daily  .  midazolam  2 mg Intravenous Once  . naloxone      . naloxone      . pantoprazole (PROTONIX) IV  40 mg Intravenous QHS  . piperacillin-tazobactam (ZOSYN)  IV  3.375 g Intravenous Q8H  . propofol      . sodium chloride  500 mL Intravenous Once  . sodium chloride  500 mL Intravenous Once  . sodium chloride  750 mL Intravenous Once  . sodium chloride  3 mL Intravenous Q12H  . vancomycin  1,000 mg Intravenous Q12H  . DISCONTD: allopurinol  100 mg Oral Daily  . DISCONTD:  gabapentin  300 mg Oral QID  . DISCONTD: oxybutynin  10 mg Oral Daily   Neurologic Exam: Limited due to patient somnolence/obtundation. Mental Status: Somnolent.  Falls asleep readily during exam.  Will open eyes to verbal stim.  Able to follow simple commands with some accuracy.  Displays some apraxia.  Intubated.  Per nurse, did not follow commands yesterday.   Cranial Nerves: II- corneals weak bilaterally, no blink to threat. III/IV/VI-gazes to objects, Pupils reactive bilaterally. IX/X-normal gag with suction Motor: 4-/5LUE, 2/5 R deltoid, 3/5 right biceps, 4/5 right grip.  Does not move left leg or right stump spontaneously or to noxious stimulation.   DTRs: Brachioradialis 1+ bilaterally, 0 left patellar reflex.  Sensory:  Internally rotates and adducts bliateral UEs to noxious stim , no response to noxious stim bilateral LEs. Cerebellar:  Patient not able to cooperate to perform.  Lab Results: CBC:  Lab 02/07/12 0320 02/06/12 1230 02/03/12 1825 02/02/12 1539  WBC 21.2* 17.0* -- --  NEUTROABS -- -- 10.1* 13.5*  HGB 8.2* 8.0* -- --  HCT 25.7* 24.3* -- --  MCV 83.4 82.9 -- --  PLT 338 353 -- --   Basic Metabolic Panel:  Lab 02/07/12 1610 02/06/12 0511 02/04/12 0400 02/03/12 1825  NA 140 136 -- --  K 4.1 4.0 -- --  CL 108 101 -- --  CO2 24 27 -- --  GLUCOSE 189* 94 -- --  BUN 10 5* -- --  CREATININE 2.19* 1.25* -- --  CALCIUM 7.6* 8.3* -- --  MG -- -- 1.5 1.6  PHOS -- -- 3.3 3.5   Liver Function Tests:  Lab 02/05/12 0520 02/03/12 1825  AST 25 20  ALT 14 12  ALKPHOS 75 85  BILITOT 0.2* 0.2*  PROT 5.8* 6.2  ALBUMIN 2.5* 2.7*   Hemoglobin A1C:  Lab 02/02/12 2045  HGBA1C 10.2*   Fasting Lipid Panel:  Lab 02/04/12 0400  CHOL --  HDL --  LDLCALC --  TRIG --  CHOLHDL --  LDLDIRECT 55   Thyroid Function Tests:  Lab 02/04/12 0400  TSH 0.463  T4TOTAL --  FREET4 --  T3FREE --  THYROIDAB --   Coagulation:  Lab 02/05/12 0520 02/03/12 1825  LABPROT 14.5  15.0  INR 1.11 1.16     Study Results:  02/06/2012   CT HEAD WITHOUT CONTRAST   Findings: Age appropriate atrophy.  No acute infarct, hemorrhage, or mass.  Calvarium is intact.  IMPRESSION: No acute abnormality.   Camelia Phenes, M.D.   02/06/2012  PORTABLE CHEST - 1 VIEW  Findings: Endotracheal tube is noted with tip directed to the towards the right mainstem bronchus - recommend 2-3 cm retraction. A right IJ central venous catheters present with tip overlying the upper SVC. Pulmonary vascular congestion and mild left basilar atelectasis again noted. There is no evidence of pneumothorax.  IMPRESSION: Endotracheal tube directed  towards the right mainstem bronchus - recommend 2-3 cm retraction.  No other significant changes identified. JEFFREY T. HU, M.D.   02/07/2012   PORTABLE CHEST - 1 VIEW   Findings: Endotracheal tube remains in the proximal right main bronchus, unchanged.  Recommend withdrawal of 4 cm.  The feeding tube is in place with the tip in the body of the stomach.  Right jugular catheter tip in the SVC.  No pneumothorax.  Interval development of bilateral airspace disease suggestive of pulmonary edema.  Increase in bibasilar atelectasis and small pleural effusion.  IMPRESSION: Endotracheal tube remains in the right main bronchus, unchanged. Recommend withdrawal 4 cm.  Increase in bilateral airspace disease, likely pulmonary edema.  Increase in bibasilar atelectasis and bilateral pleural effusions.   Camelia Phenes, M.D.   Assessment: 66 yo AAF with DM and conus medullaris syndrome, developed sepsis due to osteomyelitis right foot requiring right AKA 02/05/12.  Post op course unremarkable until patient developed hypotension following dilaudid, morpine and ocycodone, with AMS.   1. AMS: Most likely multifactorial, with possible contributing/precipitating factors as follows: Sepsis, aspiration pneumonia, possible stroke, possible seizure with/without postictal state, slow recovery from  anesthetic medications and opiates.  2. Conus medullaris syndrome. This problem is static, due to old spinal cord infarction.   Recommendations: 1. MRI brain 2. EEG 3. Avoid sedating medications. However, I recommend that primary team continue Baclofen at home dose as abrupt withdrawal from this medication can cause seizures. Also recommend restarting Neurontin at her home dose, as rapid withdrawal from this medication can also cause seizures.  4. Monitor electrolytes.   Patient seen and examined by Dr. Otelia Limes who recommends above.     LOS: 5 days   Jana Hakim [facility] [phone] 02/07/2012  2:30 PM  Electronically signed: Dr. Caryl Pina

## 2012-02-07 NOTE — Progress Notes (Signed)
VASCULAR PROGRESS NOTE  SUBJECTIVE: Sedated on vent  PHYSICAL EXAM: Filed Vitals:   02/07/12 0700 02/07/12 0727 02/07/12 0800 02/07/12 0805  BP: 104/48 93/46 116/51   Pulse: 56 78 57   Temp:    98 F (36.7 C)  TempSrc:    Oral  Resp: 13 15 12    Height:      Weight:      SpO2: 100%  100%    Right AKA inspected. Looks fine. No erythema or drainage.   LABS: Lab Results  Component Value Date   WBC 21.2* 02/07/2012   HGB 8.2* 02/07/2012   HCT 25.7* 02/07/2012   MCV 83.4 02/07/2012   PLT 338 02/07/2012   Lab Results  Component Value Date   CREATININE 2.19* 02/07/2012   Lab Results  Component Value Date   INR 1.11 02/05/2012   CBG (last 3)   Basename 02/07/12 0737 02/07/12 0324 02/06/12 1537  GLUCAP 158* 179* 135*     ASSESSMENT/PLAN: 1. 2 Days Post-Op s/p: R AKA 2. Order for dressing changes written.   Waverly Ferrari, MD, FACS Beeper: [phone] 02/07/2012

## 2012-02-07 NOTE — Progress Notes (Signed)
Pt HR dropping into 40s.  BP 103/47.  Pt will wake to painful stimuli, open eyes, and squeeze RNs hand on command.  Dr. Sung Amabile notified.  Will monitor pt.

## 2012-02-07 NOTE — Progress Notes (Signed)
MRI reviewed: No signal alteration or structural lesion suggestive of an etiology for the patient's mental status changes is seen.   EEG preliminarily reviewed: Diffuse slowing in all leads. Question of a few isolated phase reversals or spikes at some points during the tracing, but no electrographic seizures seen.    Recommendations: Continue current supportive care and empiric antibiotic therapy. Continue with recommendations as per today's consult note.    Electronically signed: Dr. Caryl Pina

## 2012-02-07 NOTE — Progress Notes (Signed)
FMTS Attending NOte Discussed case with resident team, CCM team.  Patient intubated for airway protection, awaiting full neuro evaluation to determine cause of mental status change.  Paula Compton, MD

## 2012-02-07 NOTE — Progress Notes (Signed)
Portable EEG completed

## 2012-02-08 ENCOUNTER — Inpatient Hospital Stay (HOSPITAL_COMMUNITY): Payer: Medicare Other

## 2012-02-08 ENCOUNTER — Other Ambulatory Visit (HOSPITAL_COMMUNITY): Payer: BC Managed Care – PPO

## 2012-02-08 ENCOUNTER — Encounter (HOSPITAL_COMMUNITY): Payer: Self-pay | Admitting: Vascular Surgery

## 2012-02-08 ENCOUNTER — Encounter (HOSPITAL_COMMUNITY)
Admission: EM | Disposition: A | Discharge status: Home / Self Care | Payer: Self-pay | Source: Home / Self Care | Attending: Family Medicine

## 2012-02-08 DIAGNOSIS — G9589 Other specified diseases of spinal cord: Secondary | ICD-10-CM

## 2012-02-08 DIAGNOSIS — M869 Osteomyelitis, unspecified: Secondary | ICD-10-CM

## 2012-02-08 LAB — BLOOD GAS, ARTERIAL
Bicarbonate: 19.7 mEq/L — ABNORMAL LOW (ref 20.0–24.0)
MECHVT: 400 mL
PEEP: 5 cmH2O
Patient temperature: 98.6
TCO2: 20.9 mmol/L (ref 0–100)
pCO2 arterial: 40.2 mmHg (ref 35.0–45.0)
pH, Arterial: 7.311 — ABNORMAL LOW (ref 7.350–7.400)

## 2012-02-08 LAB — BASIC METABOLIC PANEL
BUN: 17 mg/dL (ref 6–23)
Creatinine, Ser: 2.35 mg/dL — ABNORMAL HIGH (ref 0.50–1.10)
GFR calc Af Amer: 23 mL/min — ABNORMAL LOW (ref >90)
GFR calc non Af Amer: 20 mL/min — ABNORMAL LOW (ref >90)
Glucose, Bld: 131 mg/dL — ABNORMAL HIGH (ref 70–99)
Potassium: 3.7 mEq/L (ref 3.5–5.1)

## 2012-02-08 LAB — TSH: TSH: 0.374 u[IU]/mL (ref 0.350–4.500)

## 2012-02-08 LAB — CBC
HCT: 24.3 % — ABNORMAL LOW (ref 36.0–46.0)
MCHC: 32.5 g/dL (ref 30.0–36.0)
RDW: 15.5 % (ref 11.5–15.5)
WBC: 25.4 10*3/uL — ABNORMAL HIGH (ref 4.0–10.5)

## 2012-02-08 LAB — GLUCOSE, CAPILLARY
Glucose-Capillary: 115 mg/dL — ABNORMAL HIGH (ref 70–99)
Glucose-Capillary: 148 mg/dL — ABNORMAL HIGH (ref 70–99)

## 2012-02-08 SURGERY — ABDOMINAL AORTAGRAM
Anesthesia: LOCAL

## 2012-02-08 MED ORDER — SODIUM CHLORIDE 0.9 % IV SOLN
250.0000 mg | Freq: Four times a day (QID) | INTRAVENOUS | Status: DC
  Administered 2012-02-08 – 2012-02-10 (×9): 250 mg via INTRAVENOUS
  Filled 2012-02-08 (×12): qty 250

## 2012-02-08 MED ORDER — GABAPENTIN 300 MG PO CAPS
300.0000 mg | ORAL_CAPSULE | Freq: Four times a day (QID) | ORAL | Status: DC
  Administered 2012-02-08 – 2012-02-16 (×33): 300 mg via ORAL
  Filled 2012-02-08 (×39): qty 1

## 2012-02-08 MED ORDER — SODIUM CHLORIDE 0.45 % IV SOLN
INTRAVENOUS | Status: DC
  Administered 2012-02-08: 20 mL/h via INTRAVENOUS
  Administered 2012-02-09: 06:00:00 via INTRAVENOUS
  Administered 2012-02-09: 10 mL via INTRAVENOUS
  Administered 2012-02-18: 500 mL via INTRAVENOUS

## 2012-02-08 MED ORDER — FUROSEMIDE 10 MG/ML IJ SOLN
40.0000 mg | Freq: Once | INTRAMUSCULAR | Status: AC
  Administered 2012-02-08: 40 mg via INTRAVENOUS
  Filled 2012-02-08: qty 4

## 2012-02-08 MED ORDER — GABAPENTIN 600 MG PO TABS
300.0000 mg | ORAL_TABLET | Freq: Four times a day (QID) | ORAL | Status: DC
  Administered 2012-02-08: 300 mg via ORAL
  Filled 2012-02-08 (×4): qty 0.5

## 2012-02-08 MED ORDER — IOHEXOL 300 MG/ML  SOLN
20.0000 mL | INTRAMUSCULAR | Status: AC
  Administered 2012-02-08 (×2): 20 mL via ORAL

## 2012-02-08 MED ORDER — HYDROCORTISONE SOD SUCCINATE 100 MG IJ SOLR
50.0000 mg | Freq: Three times a day (TID) | INTRAMUSCULAR | Status: DC
  Administered 2012-02-08 – 2012-02-10 (×6): 50 mg via INTRAVENOUS
  Filled 2012-02-08 (×9): qty 1

## 2012-02-08 NOTE — Progress Notes (Signed)
Subjective: Interval History: none.. patient currently intubated. Patient's husband and son and other family members are present. Dr. [name] is here for discussion as well.  Objective: Vital signs in last 24 hours: Temp:  [97.9 F (36.6 C)-98.4 F (36.9 C)] 97.9 F (36.6 C) (05/20 0400) Pulse Rate:  [49-140] 67  (05/20 1139) Resp:  [8-24] 12  (05/20 1139) BP: (92-152)/(36-77) 126/72 mmHg (05/20 1139) SpO2:  [50 %-100 %] 100 % (05/20 1139) FiO2 (%):  [39.7 %-40.7 %] 40 % (05/20 1139) Weight:  [188 lb 7.9 oz (85.5 kg)] 188 lb 7.9 oz (85.5 kg) (05/20 0500)  Intake/Output from previous day: 05/19 0701 - 05/20 0700 In: 3415 [I.V.:1995; IV Piggyback:1420] Out: 767 [Urine:767] Intake/Output this shift: Total I/O In: 700.3 [I.V.:360.3; Other:140; IV Piggyback:200] Out: 27 [Urine:27]  General appearance: Intubated poorly responsive Extremities: Right above-knee dictation incision healing nicely. Left foot warm. Superficial ulcer on the left heel.  Lab Results:  Basename 02/08/12 0425 02/07/12 0320  WBC 25.4* 21.2*  HGB 7.9* 8.2*  HCT 24.3* 25.7*  PLT 361 338   BMET  Basename 02/08/12 0425 02/07/12 0320  NA 142 140  K 3.7 4.1  CL 111 108  CO2 21 24  GLUCOSE 131* 189*  BUN 17 10  CREATININE 2.35* 2.19*  CALCIUM 7.9* 7.6*    Studies/Results: Ct Head Wo Contrast  02/06/2012  *RADIOLOGY REPORT*  Clinical Data: Not waking up after surgery.  Rule out stroke.  CT HEAD WITHOUT CONTRAST  Technique:  Contiguous axial images were obtained from the base of the skull through the vertex without contrast.  Comparison: CT 11/18/2008  Findings: Age appropriate atrophy.  No acute infarct, hemorrhage, or mass.  Calvarium is intact.  IMPRESSION: No acute abnormality.  Original Report Authenticated By: Camelia Phenes, M.D.   Mr Brain Wo Contrast  02/07/2012  *RADIOLOGY REPORT*  Clinical Data: CVA.  Mental status change  MRI HEAD WITHOUT CONTRAST  Technique:  Multiplanar, multiecho pulse  sequences of the brain and surrounding structures were obtained according to standard protocol without intravenous contrast.  Comparison: CT head 02/06/2012  Findings: Negative for acute infarct.  Generalized atrophy.  No significant chronic ischemic changes are present.  Negative for hemorrhage or mass lesion.   Mild mastoid sinus effusion on the left.  Mild mucosal edema paranasal sinuses.  No air-fluid levels.  IMPRESSION: Negative for acute infarct.  Generalized atrophy without acute intracranial abnormality.  Original Report Authenticated By: Camelia Phenes, M.D.   Mr Foot Right W Wo Contrast  02/02/2012  *RADIOLOGY REPORT*  Clinical Data: Heel wound with possible osteomyelitis shown on radiography.  MRI OF THE RIGHT FOREFOOT WITHOUT AND WITH CONTRAST  Technique:  Multiplanar, multisequence MR imaging was performed both before and after administration of intravenous contrast.  Contrast: 15mL MULTIHANCE GADOBENATE DIMEGLUMINE 529 MG/ML IV SOLN  Comparison: 02/02/2012  Findings: Abnormal osseous edema and enhancement noted posteriorly in the calcaneus adjacent to the Achilles insertion site, with adjacent subcutaneous edema.  The appearance is compatible with calcaneal osteomyelitis and adjacent cellulitis.  No abscess is observed.  There is extensive abnormal subcutaneous edema laterally and along the dorsum of the foot, which not entirely included on today's ankle examination.  No findings of osteomyelitis involving the distal tibia, distal fibula, talus, midfoot, or metatarsal bases.  Low-level edema and enhancement noted tracking deep to the plantar fascia, without drainable abscess observed.  Nonstandard angulation was utilized to best evaluate the calcaneus, and ligamentous assessment of the ankle is problematic, although  no obvious lateral ligamentous complex or deltoid ligament discontinuity is observed, and the flexor tendons of the ankle appear grossly intact. Spring ligament appears intact.   IMPRESSION:  1.  Posterior calcaneal osteomyelitis with adjacent cellulitis. 2.  Suspected low-level fasciitis tracking along the margins of the plantar fascia. 3.  Dorsal subcutaneous edema in the foot probably represents cellulitis given the low-level associated enhancement. 4.  No drainable abscess observed.  Original Report Authenticated By: Dellia Cloud, M.D.   Dg Chest Port 1 View  02/08/2012  *RADIOLOGY REPORT*  Clinical Data: Ventilator.  PORTABLE CHEST - 1 VIEW  Comparison: 02/07/2012  Findings: Worsening aeration of the lungs with increasing vascular congestion and bilateral perihilar/lower lobe opacities, likely edema and/or atelectasis.  Low lung volumes.  Mild cardiomegaly.  IMPRESSION: Worsening aeration with increasing edema/atelectasis.  Original Report Authenticated By: Cyndie Chime, M.D.   Dg Chest Port 1 View  02/07/2012  *RADIOLOGY REPORT*  Clinical Data: Retracted endotracheal tube  PORTABLE CHEST - 1 VIEW  Comparison: 02/07/2012  Findings: Endotracheal tube has been pulled back from the right main bronchus and is now 5 mm above the carina.  It could be withdrawn and additional 2 cm.  Feeding tube is in the stomach.  Improved aeration with decrease in bilateral edema.  Bibasilar atelectasis remains.  Central venous catheter tip remains in the SVC.  IMPRESSION: Endotracheal tube has been withdrawn and is now 5 mm above the carina.  Improvement in bilateral edema.  Original Report Authenticated By: Camelia Phenes, M.D.   Portable Chest Xray In Am  02/07/2012  *RADIOLOGY REPORT*  Clinical Data: Endotracheal tube position  PORTABLE CHEST - 1 VIEW  Comparison: 02/06/2012  Findings: Endotracheal tube remains in the proximal right main bronchus, unchanged.  Recommend withdrawal of 4 cm.  The feeding tube is in place with the tip in the body of the stomach.  Right jugular catheter tip in the SVC.  No pneumothorax.  Interval development of bilateral airspace disease suggestive of  pulmonary edema.  Increase in bibasilar atelectasis and small pleural effusion.  IMPRESSION: Endotracheal tube remains in the right main bronchus, unchanged. Recommend withdrawal 4 cm.  Increase in bilateral airspace disease, likely pulmonary edema.  Increase in bibasilar atelectasis and bilateral pleural effusions.  Original Report Authenticated By: Camelia Phenes, M.D.   Portable Chest Xray  02/06/2012  *RADIOLOGY REPORT*  Clinical Data: Endotracheal tube placement.  PORTABLE CHEST - 1 VIEW  Comparison: 02/05/2012  Findings: Endotracheal tube is noted with tip directed to the towards the right mainstem bronchus - recommend 2-3 cm retraction. A right IJ central venous catheters present with tip overlying the upper SVC. Pulmonary vascular congestion and mild left basilar atelectasis again noted. There is no evidence of pneumothorax.  IMPRESSION: Endotracheal tube directed towards the right mainstem bronchus - recommend 2-3 cm retraction.  No other significant changes identified.  These results were called to Maralyn Sago, R.N. on 02/05/2012 at 6:23 p.m.  Original Report Authenticated By: Rosendo Gros, M.D.   Dg Chest Port 1 View  02/05/2012  *RADIOLOGY REPORT*  Clinical Data: Follow-up respiratory failure  PORTABLE CHEST - 1 VIEW  Comparison: Chest radiograph 02/04/2012  Findings: Stable cardiac silhouette.  Right central venous line is unchanged.  Improvement in bibasilar atelectasis compared to prior. Similar central venous congestion.  No pneumothorax.  IMPRESSION: Improvement in basilar atelectasis.  Original Report Authenticated By: Genevive Bi, M.D.   Dg Chest Port 1 View  02/04/2012  *RADIOLOGY REPORT*  Clinical  Data: Respiratory failure, shortness of breath  PORTABLE CHEST - 1 VIEW  Comparison: 02/03/2012  Findings: Cardiomegaly again noted.  Stable right IJ central line position.  Persistent mild congestion/edema.  Probable small left pleural effusion.  Bilateral basilar atelectasis or infiltrate.   IMPRESSION:  Persistent mild congestion/edema.  Probable small left pleural effusion.  Bilateral basilar atelectasis or infiltrate.  Original Report Authenticated By: Natasha Mead, M.D.   Dg Chest Portable 1 View  02/03/2012  *RADIOLOGY REPORT*  Clinical Data: Central line placement.  PORTABLE CHEST - 1 VIEW  Comparison: 11/18/2008.  Findings: Right IJ central line tip projects over the SVC.  No pneumothorax.  Trachea is midline.  Heart is enlarged, stable. There is mild diffuse bilateral air space disease.  No definite pleural effusions.  IMPRESSION:  1.  Right IJ central line placement without pneumothorax. 2.  Pulmonary edema.  Original Report Authenticated By: Reyes Ivan, M.D.   Dg Abd Portable 1v  02/06/2012  *RADIOLOGY REPORT*  Clinical Data: Panda tube placement  PORTABLE ABDOMEN - 1 VIEW  Comparison: None.  Findings: Feeding tube is in the proximal stomach.  Mild ileus.  Gas is present in the stomach and colon.  Left lower lobe airspace disease  IMPRESSION: Feeding tube is in the proximal stomach.  Original Report Authenticated By: Camelia Phenes, M.D.   Dg Foot Complete Right  02/02/2012  *RADIOLOGY REPORT*  Clinical Data: Wound at heel question osteomyelitis, history diabetes, fell 2 weeks ago  RIGHT FOOT COMPLETE - 3+ VIEW  Comparison: None.  Findings: Diffuse soft tissue swelling. Osseous demineralization. Joint spaces preserved. Soft tissue irregularity and dressing artifacts dorsal to the calcaneus and Achilles insertion. Suspicious area of potential bone destruction is seen at the posterior margin of the calcaneus suspicious for osteomyelitis. Small plantar calcaneal spur. No additional fracture, dislocation or bone destruction seen.  IMPRESSION: Significant soft tissue swelling diffusely in right foot with osseous demineralization. Suspected area of bone destruction at the posterior margin of the calcaneus deep to the known wound suspicious for osteomyelitis. If further imaging is  required recommend MR imaging with and without contrast.  Original Report Authenticated By: Lollie Marrow, M.D.   Anti-infectives: Anti-infectives     Start     Dose/Rate Route Frequency Ordered Stop   02/08/12 1200   imipenem-cilastatin (PRIMAXIN) 250 mg in sodium chloride 0.9 % 100 mL IVPB        250 mg 200 mL/hr over 30 Minutes Intravenous 4 times per day 02/08/12 1046     02/07/12 1500   piperacillin-tazobactam (ZOSYN) IVPB 2.25 g  Status:  Discontinued        2.25 g 100 mL/hr over 30 Minutes Intravenous 3 times per day 02/07/12 1458 02/08/12 1046   02/07/12 0800   vancomycin (VANCOCIN) IVPB 1000 mg/200 mL premix        1,000 mg 200 mL/hr over 60 Minutes Intravenous Every 24 hours 02/07/12 1459     02/03/12 2000   vancomycin (VANCOCIN) IVPB 1000 mg/200 mL premix  Status:  Discontinued        1,000 mg 200 mL/hr over 60 Minutes Intravenous Every 12 hours 02/03/12 0919 02/07/12 1459   02/03/12 1730   piperacillin-tazobactam (ZOSYN) IVPB 3.375 g  Status:  Discontinued        3.375 g 100 mL/hr over 30 Minutes Intravenous  Once 02/03/12 1723 02/03/12 1725   02/03/12 1730   vancomycin (VANCOCIN) IVPB 1000 mg/200 mL premix  Status:  Discontinued  1,000 mg 200 mL/hr over 60 Minutes Intravenous  Once 02/03/12 1723 02/03/12 1725   02/03/12 0100   piperacillin-tazobactam (ZOSYN) IVPB 3.375 g  Status:  Discontinued        3.375 g 12.5 mL/hr over 240 Minutes Intravenous Every 8 hours 02/02/12 2233 02/07/12 1458   02/02/12 1700   vancomycin (VANCOCIN) 1,250 mg in sodium chloride 0.9 % 250 mL IVPB  Status:  Discontinued        1,250 mg 166.7 mL/hr over 90 Minutes Intravenous Every 12 hours 02/02/12 1613 02/03/12 0919   02/02/12 1545   piperacillin-tazobactam (ZOSYN) 3.375 g in dextrose 5 % 50 mL IVPB        3.375 g 100 mL/hr over 30 Minutes Intravenous  Once 02/02/12 1530 02/02/12 1937          Assessment/Plan: s/p Procedure(s) (LRB): AMPUTATION BELOW KNEE  (Right) Progressive overall deterioration with change in mental status and respiratory status. Patient also with renal insufficiency worsening with a creatinine of 2.3 range. I discussed this with the patient's family present. 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 I explained that there is no critical limb ischemia of her left foot and heel currently. I did explain we would need to continue to keep pressure off this and would like to evaluate her to determine if revascularization is possible to improve chances for healing of her left heel. They understand that contrast for arteriography would sleeve the injuries to her kidneys currently and at this is not an emergent study did as needed until she is stabilized. We will continue to follow along. I do not see any evidence of low extremity issues with her heel or above-knee amputation that would be causing any sepsis   LOS: 6 days   Brittony Billick 02/08/2012, 12:07 PM

## 2012-02-08 NOTE — Progress Notes (Signed)
EEG results:  CLINICAL INTERPRETATION: This routine EEG done with the patient poorly  responsive, is abnormal. Background activities in delta range have been  reactive combined with triphasic waves suggest a moderate encephalopathy  of nonspecific etiology. All triphasic waves are classically described  as hepatic encephalopathy. They can be seen in multiple different toxic  and metabolic encephalopathies. While it is possible that the triphasic  great waves represent ongoing seizure activity, this is felt to be much  less likely given their state dependence.  We have reordered a second EEG for confirmation.  Felicie Morn PA-C [facility] [phone]  02/08/2012, 2:25 PM

## 2012-02-08 NOTE — Progress Notes (Signed)
Utilization Review Completed.Elizardo Chilson T5/20/2013   

## 2012-02-08 NOTE — Progress Notes (Signed)
Clinical Social Worker reviewed case with RN who stated pt was not able to fully participate in conversation.  No other CSW needs identified at this time.  Please re consult when pt is able to participate in assessment/conversation regarding Advanced Directives.     Angelia Mould, MSW, [place] [phone]

## 2012-02-08 NOTE — Progress Notes (Signed)
Family Medicine Teaching Service Brief Progress Note: Patient remains intubated. Was intubated for airway protection in the setting of acute encephalopathy. Neurology has been consulted and EEG, MRI and head CT have been unremarkable. Most likely secondary to metabolic cause. Patient also has acute renal failure with doubled creatinine since surgery. This is thought to be secondary to hypovolemia and ATN. WBC has been increasing but vascular surgery does not think that this is secondary to her lower extremity wounds as her Right AKA continues to heal well and her left heal doesn't show any additional infection. She is on vanc and zosyn at this time. Lactic acid and repeat blood cultures pending.  Appreciate CCM's care. Will continue to follow.  Marena Chancy PGY-1 Family Medicine Teaching Service [phone]

## 2012-02-08 NOTE — Consult Note (Signed)
Date of Admission:  02/02/2012  Date of Consult:  02/08/2012  Reason for Consult:Sepsis Referring Physician: [name]  Impression/Recommendation SIRS, Sepsis Would check MRI of RLE and LLE  CT of abd and pelvis, chest Check amylase and lipase Check cortisol (on HCT ?), tsh  Comment- her exam of her RLE is clean and looks good. I suspect that this is not the source of her hypotension. She could have had an ADR to the anesthesia to cause this, as well could she have infarcted her adrenals?   Alexandra Wiley is an 66 y.o. female.  HPI: 66 yo F with hx of DM2, PVD, spinal cord infarct with paraplegia 2009. Adm on 5-14 with RLE ulcers on heel and great toe. They had been present for 3 weeks and had begun to drain foul smelling d/c and had proximal streaking. She was seen by PCP and had plain film concerning for osteomyelitis. An MRI confirmed this as well as fascitis. She was started on vanco/zosyn. 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 Her hospital course was complicated by the development of post-op encephalopathy. She required intbx for airway protection 5-18 as well as levophed for maintenance of blood pressure. She developed ARF as well. She had mild facial asymmetry on 5-18 and underwent CT and MRI of head neither of which showed an acute event.  Continues to be hypotensive but afebrile.   Past Medical History  Diagnosis Date  . Ulcer   . Hypertension   . Heart murmur   . Gout   . PVD (peripheral vascular disease)   . Pain in limb   . CHF (congestive heart failure)   . 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 Conus medullaris syndrome   . Arthritis   . Breast cancer ~ 1975    right  . Carpal tunnel syndrome of right wrist   . High cholesterol   . Angina   . Myocardial infarction 1996  . Pneumonia ~ 2002; ~1975  . Type II diabetes mellitus   . Exertional dyspnea   . Hypothyroidism   . Seizures 02/02/12   "used to; a long time ago"  . Stroke 10/2008    "spinal cord"    Past Surgical History  Procedure Date  . Foot tendon surgery 2000    Left foot  . Tubal ligation   . Femur fracture surgery 1960's    Left leg, repair of non-union femur fracture by Dr. Orland Jarred  . Breast lumpectomy ~1975    Right breast carcinoma in situ  . Tonsillectomy and adenoidectomy ~ 1961  . Dilation and curettage of uterus   . Fracture surgery   ergies:   No Known Allergies  Medications:  Scheduled:   . antiseptic oral rinse  15 mL Mouth Rinse BID  . atorvastatin  10 mg Oral Daily  . baclofen  10 mg Oral BID  . chlorhexidine  15 mL Mouth/Throat BID  . furosemide  40 mg Intravenous Once  . gabapentin  300 mg Oral QID  . heparin  5,000 Units Subcutaneous Q8H  . hydrocortisone sodium succinate  50 mg Intravenous Q8H  . imipenem-cilastatin  250 mg Intravenous Q6H  . insulin aspart  0-15 Units Subcutaneous TID WC  . insulin aspart protamine-insulin aspart  12 Units Subcutaneous Q supper  . levothyroxine  38 mcg Intravenous Daily  . pantoprazole (PROTONIX) IV  40 mg Intravenous QHS  . sodium chloride  500  mL Intravenous Once  . sodium chloride  500 mL Intravenous Once  . sodium chloride  3 mL Intravenous Q12H  . vancomycin  1,000 mg Intravenous Q24H  . DISCONTD: hydrocortisone sodium succinate  50 mg Intravenous Q6H  . DISCONTD: piperacillin-tazobactam (ZOSYN)  IV  2.25 g Intravenous Q8H  . DISCONTD: piperacillin-tazobactam (ZOSYN)  IV  3.375 g Intravenous Q8H  . DISCONTD: vancomycin  1,000 mg Intravenous Q12H    Social History:  reports that she has been smoking Cigarettes.  She has a 12.5 pack-year smoking history. She has never used smokeless tobacco. She reports that she drinks alcohol. She reports that she does not use illicit drugs.  Family History  Problem Relation Age of Onset  . Arthritis Mother   . Hypertension Mother   . Heart disease Father   . Diabetes Father     General ROS: no hx  of TB/sick exposure, long hx of constipation requiring digital disimpaction, no ET secretions.   Blood pressure 146/44, pulse 50, temperature 97.9 F (36.6 C), temperature source Oral, resp. rate 8, height 4\' 11"  (1.499 m), weight 85.5 kg (188 lb 7.9 oz), SpO2 100.00%. General appearance: no distress Eyes: negative findings: PERRL Neck: no adenopathy Lungs: rhonchi bilaterally, mild Heart: regular rate and rhythm Abdomen: normal findings: bowel sounds normal and soft and abnormal findings:  distended and mild distension Extremities: RLE stump is clean, no fluctuance, no increase in heat. LLE shows a cool foot, heel ulcer (stage 2) without d/c.    Results for orders placed during the hospital encounter of 02/02/12 (from the past 48 hour(s))  GLUCOSE, CAPILLARY     Status: Abnormal   Collection Time   02/06/12 11:51 AM      Component Value Range Comment   Glucose-Capillary 134 (*) 70 - 99 (mg/dL)   CBC     Status: Abnormal   Collection Time   02/06/12 12:30 PM      Component Value Range Comment   WBC 17.0 (*) 4.0 - 10.5 (K/uL)    RBC 2.93 (*) 3.87 - 5.11 (MIL/uL)    Hemoglobin 8.0 (*) 12.0 - 15.0 (g/dL)    HCT 29.5 (*) 28.4 - 46.0 (%)    MCV 82.9  78.0 - 100.0 (fL)    MCH 27.3  26.0 - 34.0 (pg)    MCHC 32.9  30.0 - 36.0 (g/dL)    RDW 13.2  44.0 - 10.2 (%)    Platelets 353  150 - 400 (K/uL)   CARDIAC PANEL(CRET KIN+CKTOT+MB+TROPI)     Status: Abnormal   Collection Time   02/06/12 12:30 PM      Component Value Range Comment   Total CK 313 (*) 7 - 177 (U/L)    CK, MB 2.3  0.3 - 4.0 (ng/mL)    Troponin I <0.30  <0.30 (ng/mL)    Relative Index 0.7  0.0 - 2.5    CORTISOL     Status: Normal   Collection Time   02/06/12  2:00 PM      Component Value Range Comment   Cortisol, Plasma 18.1     GLUCOSE, CAPILLARY     Status: Abnormal   Collection Time   02/06/12  3:37 PM      Component Value Range Comment   Glucose-Capillary 135 (*) 70 - 99 (mg/dL)   CARDIAC PANEL(CRET  KIN+CKTOT+MB+TROPI)     Status: Abnormal   Collection Time   02/06/12  6:18 PM      Component Value Range Comment  Total CK 466 (*) 7 - 177 (U/L)    CK, MB 3.2  0.3 - 4.0 (ng/mL)    Troponin I 0.40 (*) <0.30 (ng/mL)    Relative Index 0.7  0.0 - 2.5    CARDIAC PANEL(CRET KIN+CKTOT+MB+TROPI)     Status: Abnormal   Collection Time   02/07/12  3:20 AM      Component Value Range Comment   Total CK 443 (*) 7 - 177 (U/L)    CK, MB 3.3  0.3 - 4.0 (ng/mL)    Troponin I 0.36 (*) <0.30 (ng/mL)    Relative Index 0.7  0.0 - 2.5    CBC     Status: Abnormal   Collection Time   02/07/12  3:20 AM      Component Value Range Comment   WBC 21.2 (*) 4.0 - 10.5 (K/uL)    RBC 3.08 (*) 3.87 - 5.11 (MIL/uL)    Hemoglobin 8.2 (*) 12.0 - 15.0 (g/dL)    HCT 16.1 (*) 09.6 - 46.0 (%)    MCV 83.4  78.0 - 100.0 (fL)    MCH 26.6  26.0 - 34.0 (pg)    MCHC 31.9  30.0 - 36.0 (g/dL)    RDW 04.5  40.9 - 81.1 (%)    Platelets 338  150 - 400 (K/uL)   BASIC METABOLIC PANEL     Status: Abnormal   Collection Time   02/07/12  3:20 AM      Component Value Range Comment   Sodium 140  135 - 145 (mEq/L)    Potassium 4.1  3.5 - 5.1 (mEq/L)    Chloride 108  96 - 112 (mEq/L)    CO2 24  19 - 32 (mEq/L)    Glucose, Bld 189 (*) 70 - 99 (mg/dL)    BUN 10  6 - 23 (mg/dL)    Creatinine, Ser 9.14 (*) 0.50 - 1.10 (mg/dL) DELTA CHECK NOTED   Calcium 7.6 (*) 8.4 - 10.5 (mg/dL)    GFR calc non Af Amer 22 (*) >90 (mL/min)    GFR calc Af Amer 25 (*) >90 (mL/min)   GLUCOSE, CAPILLARY     Status: Abnormal   Collection Time   02/07/12  3:24 AM      Component Value Range Comment   Glucose-Capillary 179 (*) 70 - 99 (mg/dL)   GLUCOSE, CAPILLARY     Status: Abnormal   Collection Time   02/07/12  7:37 AM      Component Value Range Comment   Glucose-Capillary 158 (*) 70 - 99 (mg/dL)   CREATININE, URINE, RANDOM     Status: Normal   Collection Time   02/07/12  9:55 AM      Component Value Range Comment   Creatinine, Urine 76.98       SODIUM, URINE, RANDOM     Status: Normal   Collection Time   02/07/12  9:55 AM      Component Value Range Comment   Sodium, Ur 31     UREA NITROGEN, URINE     Status: Normal   Collection Time   02/07/12  9:55 AM      Component Value Range Comment   Urea Nitrogen, Ur 204     OSMOLALITY, URINE     Status: Abnormal   Collection Time   02/07/12  9:55 AM      Component Value Range Comment   Osmolality, Ur 272 (*) 390 - 1090 (mOsm/kg)   CULTURE, BLOOD (ROUTINE X 2)  Status: Normal (Preliminary result)   Collection Time   02/07/12 11:42 AM      Component Value Range Comment   Specimen Description BLOOD RIGHT HAND      Special Requests BOTTLES DRAWN AEROBIC ONLY 3CC      Culture  Setup Time [id number]      Culture        Value:        BLOOD CULTURE RECEIVED NO GROWTH TO DATE CULTURE WILL BE HELD FOR 5 DAYS BEFORE ISSUING A FINAL NEGATIVE REPORT   Report Status PENDING     GLUCOSE, CAPILLARY     Status: Abnormal   Collection Time   02/07/12  1:11 PM      Component Value Range Comment   Glucose-Capillary 133 (*) 70 - 99 (mg/dL)   CULTURE, BLOOD (ROUTINE X 2)     Status: Normal (Preliminary result)   Collection Time   02/07/12  1:37 PM      Component Value Range Comment   Specimen Description BLOOD LEFT HAND      Special Requests BOTTLES DRAWN AEROBIC AND ANAEROBIC 3CC      Culture  Setup Time [id number]      Culture        Value:        BLOOD CULTURE RECEIVED NO GROWTH TO DATE CULTURE WILL BE HELD FOR 5 DAYS BEFORE ISSUING A FINAL NEGATIVE REPORT   Report Status PENDING     URINALYSIS, WITH MICROSCOPIC     Status: Abnormal   Collection Time   02/07/12  2:00 PM      Component Value Range Comment   Color, Urine YELLOW  YELLOW     APPearance CLOUDY (*) CLEAR     Specific Gravity, Urine 1.016  1.005 - 1.030     pH 5.5  5.0 - 8.0     Glucose, UA NEGATIVE  NEGATIVE (mg/dL)    Hgb urine dipstick SMALL (*) NEGATIVE     Bilirubin Urine NEGATIVE  NEGATIVE     Ketones, ur NEGATIVE   NEGATIVE (mg/dL)    Protein, ur 30 (*) NEGATIVE (mg/dL)    Urobilinogen, UA 1.0  0.0 - 1.0 (mg/dL)    Nitrite NEGATIVE  NEGATIVE     Leukocytes, UA SMALL (*) NEGATIVE     WBC, UA 0-2  <3 (WBC/hpf)    RBC / HPF 0-2  <3 (RBC/hpf)    Squamous Epithelial / LPF FEW (*) RARE    GLUCOSE, CAPILLARY     Status: Abnormal   Collection Time   02/07/12  4:03 PM      Component Value Range Comment   Glucose-Capillary 122 (*) 70 - 99 (mg/dL)   GLUCOSE, CAPILLARY     Status: Abnormal   Collection Time   02/07/12 10:36 PM      Component Value Range Comment   Glucose-Capillary 102 (*) 70 - 99 (mg/dL)    Comment 1 Notify RN     GLUCOSE, CAPILLARY     Status: Abnormal   Collection Time   02/08/12 12:13 AM      Component Value Range Comment   Glucose-Capillary 115 (*) 70 - 99 (mg/dL)   GLUCOSE, CAPILLARY     Status: Abnormal   Collection Time   02/08/12  3:44 AM      Component Value Range Comment   Glucose-Capillary 122 (*) 70 - 99 (mg/dL)   BASIC METABOLIC PANEL     Status: Abnormal   Collection Time   02/08/12  4:25 AM      Component Value Range Comment   Sodium 142  135 - 145 (mEq/L)    Potassium 3.7  3.5 - 5.1 (mEq/L)    Chloride 111  96 - 112 (mEq/L)    CO2 21  19 - 32 (mEq/L)    Glucose, Bld 131 (*) 70 - 99 (mg/dL)    BUN 17  6 - 23 (mg/dL)    Creatinine, Ser 1.61 (*) 0.50 - 1.10 (mg/dL)    Calcium 7.9 (*) 8.4 - 10.5 (mg/dL)    GFR calc non Af Amer 20 (*) >90 (mL/min)    GFR calc Af Amer 23 (*) >90 (mL/min)   CBC     Status: Abnormal   Collection Time   02/08/12  4:25 AM      Component Value Range Comment   WBC 25.4 (*) 4.0 - 10.5 (K/uL)    RBC 2.90 (*) 3.87 - 5.11 (MIL/uL)    Hemoglobin 7.9 (*) 12.0 - 15.0 (g/dL)    HCT 09.6 (*) 04.5 - 46.0 (%)    MCV 83.8  78.0 - 100.0 (fL)    MCH 27.2  26.0 - 34.0 (pg)    MCHC 32.5  30.0 - 36.0 (g/dL)    RDW 40.9  81.1 - 91.4 (%)    Platelets 361  150 - 400 (K/uL)   BLOOD GAS, ARTERIAL     Status: Abnormal   Collection Time   02/08/12  4:55 AM        Component Value Range Comment   FIO2 0.40      Delivery systems VENTILATOR      Mode PRESSURE REGULATED VOLUME CONTROL      VT 400      Rate 12      Peep/cpap 5.0      pH, Arterial 7.311 (*) 7.350 - 7.400     pCO2 arterial 40.2  35.0 - 45.0 (mmHg)    pO2, Arterial 124.0 (*) 80.0 - 100.0 (mmHg)    Bicarbonate 19.7 (*) 20.0 - 24.0 (mEq/L)    TCO2 20.9  0 - 100 (mmol/L)    Acid-base deficit 5.4 (*) 0.0 - 2.0 (mmol/L)    O2 Saturation 98.6      Patient temperature 98.6      Collection site LEFT RADIAL      Drawn by [id number]      Sample type ARTERIAL DRAW      Allens test (pass/fail) PASS  PASS    GLUCOSE, CAPILLARY     Status: Abnormal   Collection Time   02/08/12  8:17 AM      Component Value Range Comment   Glucose-Capillary 128 (*) 70 - 99 (mg/dL)       Component Value Date/Time   SDES BLOOD LEFT HAND 02/07/2012 1337   SPECREQUEST BOTTLES DRAWN AEROBIC AND ANAEROBIC 3CC 02/07/2012 1337   CULT        BLOOD CULTURE RECEIVED NO GROWTH TO DATE CULTURE WILL BE HELD FOR 5 DAYS BEFORE ISSUING A FINAL NEGATIVE REPORT 02/07/2012 1337   REPTSTATUS PENDING 02/07/2012 1337   Ct Head Wo Contrast  02/06/2012  *RADIOLOGY REPORT*  Clinical Data: Not waking up after surgery.  Rule out stroke.  CT HEAD WITHOUT CONTRAST  Technique:  Contiguous axial images were obtained from the base of the skull through the vertex without contrast.  Comparison: CT 11/18/2008  Findings: Age appropriate atrophy.  No acute infarct, hemorrhage, or mass.  Calvarium is intact.  IMPRESSION: No acute  abnormality.  Original Report Authenticated By: Camelia Phenes, M.D.   Mr Brain Wo Contrast  02/07/2012  *RADIOLOGY REPORT*  Clinical Data: CVA.  Mental status change  MRI HEAD WITHOUT CONTRAST  Technique:  Multiplanar, multiecho pulse sequences of the brain and surrounding structures were obtained according to standard protocol without intravenous contrast.  Comparison: CT head 02/06/2012  Findings: Negative for acute infarct.   Generalized atrophy.  No significant chronic ischemic changes are present.  Negative for hemorrhage or mass lesion.   Mild mastoid sinus effusion on the left.  Mild mucosal edema paranasal sinuses.  No air-fluid levels.  IMPRESSION: Negative for acute infarct.  Generalized atrophy without acute intracranial abnormality.  Original Report Authenticated By: Camelia Phenes, M.D.   Dg Chest Port 1 View  02/08/2012  *RADIOLOGY REPORT*  Clinical Data: Ventilator.  PORTABLE CHEST - 1 VIEW  Comparison: 02/07/2012  Findings: Worsening aeration of the lungs with increasing vascular congestion and bilateral perihilar/lower lobe opacities, likely edema and/or atelectasis.  Low lung volumes.  Mild cardiomegaly.  IMPRESSION: Worsening aeration with increasing edema/atelectasis.  Original Report Authenticated By: Cyndie Chime, M.D.   Dg Chest Port 1 View  02/07/2012  *RADIOLOGY REPORT*  Clinical Data: Retracted endotracheal tube  PORTABLE CHEST - 1 VIEW  Comparison: 02/07/2012  Findings: Endotracheal tube has been pulled back from the right main bronchus and is now 5 mm above the carina.  It could be withdrawn and additional 2 cm.  Feeding tube is in the stomach.  Improved aeration with decrease in bilateral edema.  Bibasilar atelectasis remains.  Central venous catheter tip remains in the SVC.  IMPRESSION: Endotracheal tube has been withdrawn and is now 5 mm above the carina.  Improvement in bilateral edema.  Original Report Authenticated By: Camelia Phenes, M.D.   Portable Chest Xray In Am  02/07/2012  *RADIOLOGY REPORT*  Clinical Data: Endotracheal tube position  PORTABLE CHEST - 1 VIEW  Comparison: 02/06/2012  Findings: Endotracheal tube remains in the proximal right main bronchus, unchanged.  Recommend withdrawal of 4 cm.  The feeding tube is in place with the tip in the body of the stomach.  Right jugular catheter tip in the SVC.  No pneumothorax.  Interval development of bilateral airspace disease suggestive of  pulmonary edema.  Increase in bibasilar atelectasis and small pleural effusion.  IMPRESSION: Endotracheal tube remains in the right main bronchus, unchanged. Recommend withdrawal 4 cm.  Increase in bilateral airspace disease, likely pulmonary edema.  Increase in bibasilar atelectasis and bilateral pleural effusions.  Original Report Authenticated By: Camelia Phenes, M.D.   Portable Chest Xray  02/06/2012  *RADIOLOGY REPORT*  Clinical Data: Endotracheal tube placement.  PORTABLE CHEST - 1 VIEW  Comparison: 02/05/2012  Findings: Endotracheal tube is noted with tip directed to the towards the right mainstem bronchus - recommend 2-3 cm retraction. A right IJ central venous catheters present with tip overlying the upper SVC. Pulmonary vascular congestion and mild left basilar atelectasis again noted. There is no evidence of pneumothorax.  IMPRESSION: Endotracheal tube directed towards the right mainstem bronchus - recommend 2-3 cm retraction.  No other significant changes identified.  These results were called to Maralyn Sago, R.N. on 02/05/2012 at 6:23 p.m.  Original Report Authenticated By: Rosendo Gros, M.D.   Dg Abd Portable 1v  02/06/2012  *RADIOLOGY REPORT*  Clinical Data: Panda tube placement  PORTABLE ABDOMEN - 1 VIEW  Comparison: None.  Findings: Feeding tube is in the proximal stomach.  Mild ileus.  Gas is present in the stomach and colon.  Left lower lobe airspace disease  IMPRESSION: Feeding tube is in the proximal stomach.  Original Report Authenticated By: Camelia Phenes, M.D.    Thank you so much for this interesting consult,   Johny Sax [phone] 02/08/2012, 10:53 AM     LOS: 6 days

## 2012-02-08 NOTE — Progress Notes (Signed)
PT Cancellation Note  Treatment cancelled today due to medical issues with patient which prohibited therapy.  Patient now intubated.  Will sign off and ask MD to reorder when patient is more appropriate.  Thanks.  INGOLD,Rolanda Campa 02/08/2012, 10:08 AM

## 2012-02-08 NOTE — Progress Notes (Addendum)
INITIAL ADULT NUTRITION ASSESSMENT Date: 02/08/2012   Time: 11:26 AM  Reason for Assessment: VDRF; Low Braden  ASSESSMENT: Female 66 y.o.  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 Ulcer   . Hypertension   . Heart murmur   . Gout   . PVD (peripheral vascular disease)   . Pain in limb   . CHF (congestive heart failure)   . 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 Conus medullaris syndrome   . Arthritis   . Breast cancer ~ 1975    right  . Carpal tunnel syndrome of right wrist   . High cholesterol   . Angina   . Myocardial infarction 1996  . Pneumonia ~ 2002; ~1975  . Type II diabetes mellitus   . Exertional dyspnea   . Hypothyroidism   . Seizures 02/02/12    "used to; a long time ago"  . Stroke 10/2008    "spinal cord"    Related Meds:  Scheduled Meds:   . antiseptic oral rinse  15 mL Mouth Rinse BID  . atorvastatin  10 mg Oral Daily  . baclofen  10 mg Oral BID  . chlorhexidine  15 mL Mouth/Throat BID  . furosemide  40 mg Intravenous Once  . gabapentin  300 mg Oral QID  . heparin  5,000 Units Subcutaneous Q8H  . hydrocortisone sodium succinate  50 mg Intravenous Q8H  . imipenem-cilastatin  250 mg Intravenous Q6H  . insulin aspart  0-15 Units Subcutaneous TID WC  . insulin aspart protamine-insulin aspart  12 Units Subcutaneous Q supper  . levothyroxine  38 mcg Intravenous Daily  . pantoprazole (PROTONIX) IV  40 mg Intravenous QHS  . sodium chloride  500 mL Intravenous Once  . sodium chloride  500 mL Intravenous Once  . sodium chloride  3 mL Intravenous Q12H  . vancomycin  1,000 mg Intravenous Q24H  . DISCONTD: hydrocortisone sodium succinate  50 mg Intravenous Q6H  . DISCONTD: piperacillin-tazobactam (ZOSYN)  IV  2.25 g Intravenous Q8H  . DISCONTD: piperacillin-tazobactam (ZOSYN)  IV  3.375 g Intravenous Q8H  . DISCONTD: vancomycin  1,000 mg Intravenous Q12H   Continuous Infusions:   . sodium  chloride 20 mL/hr (02/08/12 1029)  . propofol Stopped (02/07/12 0600)  . DISCONTD: sodium chloride 100 mL/hr (02/08/12 0818)  . DISCONTD: norepinephrine (LEVOPHED) Adult infusion Stopped (02/06/12 2236)   PRN Meds:.acetaminophen, fentaNYL   Ht: 4\' 11"  (149.9 cm)  Wt: 188 lb 7.9 oz (85.5 kg)  Ideal Wt: 42 kg (adjusted for AKA) % Ideal Wt: 204%  Wt Readings from Last 15 Encounters:  02/08/12 188 lb 7.9 oz (85.5 kg)  02/08/12 188 lb 7.9 oz (85.5 kg)  02/08/12 188 lb 7.9 oz (85.5 kg)  11/18/11 180 lb (81.647 kg)   Usual Wt: 180 lb 3 months ago prior to amputation % Usual Wt: 104%  BMI=40.4 using adjusted weight for AKA.  Food/Nutrition Related Hx: no nutrition problems identified on admission nutrition screen  Labs:  CMP     Component Value Date/Time   NA 142 02/08/2012 0425   K 3.7 02/08/2012 0425   CL 111 02/08/2012 0425   CO2 21 02/08/2012 0425   GLUCOSE 131* 02/08/2012 0425   BUN 17 02/08/2012 0425   CREATININE 2.35* 02/08/2012 0425   CALCIUM 7.9* 02/08/2012 0425   PROT 5.8* 02/05/2012 0520   ALBUMIN 2.5* 02/05/2012 0520   AST 25 02/05/2012  0520   ALT 14 02/05/2012 0520   ALKPHOS 75 02/05/2012 0520   BILITOT 0.2* 02/05/2012 0520   GFRNONAA 20* 02/08/2012 0425   GFRAA 23* 02/08/2012 0425    CBG (last 3)   Basename 02/08/12 0817 02/08/12 0344 02/08/12 0013  GLUCAP 128* 122* 115*     Intake/Output Summary (Last 24 hours) at 02/08/12 1133 Last data filed at 02/08/12 1000  Gross per 24 hour  Intake 3792.5 ml  Output    594 ml  Net 3198.5 ml     Diet Order: NPO  IVF:    sodium chloride Last Rate: 20 mL/hr (02/08/12 1029)  propofol Last Rate: Stopped (02/07/12 0600)  DISCONTD: sodium chloride Last Rate: 100 mL/hr (02/08/12 0818)  DISCONTD: norepinephrine (LEVOPHED) Adult infusion Last Rate: Stopped (02/06/12 2236)    Estimated Nutritional Needs:   Kcal: 1450 Protein: 90-100 grams Fluid: 1.6-1.8 liters  Patient was transferred to ICU on 5/15 due to hypotension;  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  Propofol is currently off.  Patient needs adequate nutrition to support healing.  Class 2 obesity with BMI=38.1.  Patient is at nutrition risk due to prolonged NPO status since admission to hospital.  Patient was briefly on PO diet 5/15 and 5/17, expect intake was poor.   Patient with acute renal failure likely related to ATN.  Noted patient needs improvement in neuro status to extubate.  NUTRITION DIAGNOSIS: -Inadequate oral intake (NI-2.1).  Status: Ongoing  RELATED TO: inability to eat  AS EVIDENCED BY:  NPO status  MONITORING/EVALUATION(Goals):  Goal: Enteral nutrition to provide 60-70% of estimated calorie needs (22-25 kcals/kg ideal body weight) and 100% of estimated protein needs, based on ASPEN guidelines for permissive underfeeding in critically ill obese individuals.  Monitor for TF initiation, labs, weight trend.  EDUCATION NEEDS: -Education not appropriate at this time  INTERVENTION: Recommend utilize enteral feeding tube/panda for TF--start Promote at 25 ml/h, increase by 10 ml every 4 hours to goal rate of 35 ml/h with Prostat 30 ml TID to provide 1140 kcals, 98 grams protein, 705 ml free water daily.  Dietitian #:  [phone]  DOCUMENTATION CODES Per approved criteria  -Obesity Unspecified    Hettie Holstein 02/08/2012, 11:26 AM

## 2012-02-08 NOTE — Procedures (Signed)
EEG NUMBER:  [id number]  This routine EEG was requested in this 66 year old woman who has been admitted with altered mental status, sepsis and hypotension.  She also has a conus medullaris syndrome.  Her medications include gabapentin.  The EEG was done with the patient poorly responsive and on ventilator. During periods of maximal wakefulness, background activities were characterized by a mixture of poorly organized arrhythmic delta and theta activities that were symmetric.  Note was made of frequent triphasic waves that occurred frequently during periods of maximal wakefulness.  Photic stimulation did not produce a driving response.  Hyperventilation was not performed.  During periods of decreased responsiveness, background activities became of lower amplitude and were composed mainly of mixed frequency delta activities with some intermittent theta activities.  The frequency of the triphasic waves became much less frequent.  CLINICAL INTERPRETATION:  This routine EEG done with the patient poorly responsive, is abnormal.  Reactive background activities in delta range combined with triphasic waves suggest a moderate encephalopathy of nonspecific etiology.  Triphasic waves are classically described as hepatic encephalopathy.  However, they can be seen in multiple different toxic and metabolic encephalopathies.  It is felt much less likely that these triphasic waves, given their state dependence, represent ongoing seizure activity.  If clinically indicated, a repeat EEG in the future may provide more information.          ______________________________ Denton Meek, MD    RU:EAVW D:  02/08/2012 11:11:13  T:  02/08/2012 11:27:33  Job #:  [id number]

## 2012-02-08 NOTE — Progress Notes (Signed)
ANTIBIOTIC CONSULT NOTE - INITIAL  Pharmacy Consult for Imipenem Indication: Broadening; Empiric  No Known Allergies  Patient Measurements: Height: 4\' 11"  (149.9 cm) Weight: 188 lb 7.9 oz (85.5 kg) IBW/kg (Calculated) : 43.2   Vital Signs: Temp: 97.9 F (36.6 C) (05/20 0400) Temp src: Oral (05/20 0400) BP: 146/44 mmHg (05/20 1000) Pulse Rate: 50  (05/20 1000) Intake/Output from previous day: 05/19 0701 - 05/20 0700 In: 3415 [I.V.:1995; IV Piggyback:1420] Out: 767 [Urine:767] Intake/Output from this shift: Total I/O In: 690 [I.V.:350; Other:140; IV Piggyback:200] Out: 27 [Urine:27]  Labs:  Basename 02/08/12 0425 02/07/12 0955 02/07/12 0320 02/06/12 1230 02/06/12 0511  WBC 25.4* -- 21.2* 17.0* --  HGB 7.9* -- 8.2* 8.0* --  PLT 361 -- 338 353 --  LABCREA -- 76.98 -- -- --  CREATININE 2.35* -- 2.19* -- 1.25*   Estimated Creatinine Clearance: 21.7 ml/min (by C-G formula based on Cr of 2.35). No results found for this basename: VANCOTROUGH:2,VANCOPEAK:2,VANCORANDOM:2,GENTTROUGH:2,GENTPEAK:2,GENTRANDOM:2,TOBRATROUGH:2,TOBRAPEAK:2,TOBRARND:2,AMIKACINPEAK:2,AMIKACINTROU:2,AMIKACIN:2, in the last 72 hours   Microbiology: Recent Results (from the past 720 hour(s))  MRSA PCR SCREENING     Status: Normal   Collection Time   02/03/12  6:10 PM      Component Value Range Status Comment   MRSA by PCR NEGATIVE  NEGATIVE  Final   CULTURE, BLOOD (ROUTINE X 2)     Status: Normal (Preliminary result)   Collection Time   02/03/12  6:15 PM      Component Value Range Status Comment   Specimen Description BLOOD CENTRAL LINE   Final    Special Requests     Final    Value: BOTTLES DRAWN AEROBIC AND ANAEROBIC 10CC LEFT IJ CVC   Culture  Setup Time [id number]   Final    Culture     Final    Value:        BLOOD CULTURE RECEIVED NO GROWTH TO DATE CULTURE WILL BE HELD FOR 5 DAYS BEFORE ISSUING A FINAL NEGATIVE REPORT   Report Status PENDING   Incomplete   URINE CULTURE     Status: Normal   Collection Time   02/03/12  6:20 PM      Component Value Range Status Comment   Specimen Description URINE, CATHETERIZED   Final    Special Requests NONE   Final    Culture  Setup Time [id number]   Final    Colony Count NO GROWTH   Final    Culture NO GROWTH   Final    Report Status 02/05/2012 FINAL   Final   CULTURE, BLOOD (ROUTINE X 2)     Status: Normal (Preliminary result)   Collection Time   02/03/12  7:58 PM      Component Value Range Status Comment   Specimen Description BLOOD HAND RIGHT   Final    Special Requests BOTTLES DRAWN AEROBIC ONLY 3CC   Final    Culture  Setup Time [id number]   Final    Culture     Final    Value:        BLOOD CULTURE RECEIVED NO GROWTH TO DATE CULTURE WILL BE HELD FOR 5 DAYS BEFORE ISSUING A FINAL NEGATIVE REPORT   Report Status PENDING   Incomplete   CULTURE, BLOOD (ROUTINE X 2)     Status: Normal (Preliminary result)   Collection Time   02/07/12 11:42 AM      Component Value Range Status Comment   Specimen Description BLOOD RIGHT HAND   Final    Special  Requests BOTTLES DRAWN AEROBIC ONLY 3CC   Final    Culture  Setup Time [id number]   Final    Culture     Final    Value:        BLOOD CULTURE RECEIVED NO GROWTH TO DATE CULTURE WILL BE HELD FOR 5 DAYS BEFORE ISSUING A FINAL NEGATIVE REPORT   Report Status PENDING   Incomplete   CULTURE, BLOOD (ROUTINE X 2)     Status: Normal (Preliminary result)   Collection Time   02/07/12  1:37 PM      Component Value Range Status Comment   Specimen Description BLOOD LEFT HAND   Final    Special Requests BOTTLES DRAWN AEROBIC AND ANAEROBIC 3CC   Final    Culture  Setup Time [id number]   Final    Culture     Final    Value:        BLOOD CULTURE RECEIVED NO GROWTH TO DATE CULTURE WILL BE HELD FOR 5 DAYS BEFORE ISSUING A FINAL NEGATIVE REPORT   Report Status PENDING   Incomplete     Medical History: Past Medical History  Diagnosis Date  . Ulcer   . Hypertension   . Heart murmur   . Gout   . PVD  (peripheral vascular disease)   . Pain in limb   . CHF (congestive heart failure)   . 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 Conus medullaris syndrome   . Arthritis   . Breast cancer ~ 1975    right  . Carpal tunnel syndrome of right wrist   . High cholesterol   . Angina   . Myocardial infarction 1996  . Pneumonia ~ 2002; ~1975  . Type II diabetes mellitus   . Exertional dyspnea   . Hypothyroidism   . Seizures 02/02/12    "used to; a long time ago"  . Stroke 10/2008    "spinal cord"    Medications:  Anti-infectives     Start     Dose/Rate Route Frequency Ordered Stop   02/08/12 1200   imipenem-cilastatin (PRIMAXIN) 250 mg in sodium chloride 0.9 % 100 mL IVPB        250 mg 200 mL/hr over 30 Minutes Intravenous 4 times per day 02/08/12 1046     02/07/12 1500   piperacillin-tazobactam (ZOSYN) IVPB 2.25 g  Status:  Discontinued        2.25 g 100 mL/hr over 30 Minutes Intravenous 3 times per day 02/07/12 1458 02/08/12 1046   02/07/12 0800   vancomycin (VANCOCIN) IVPB 1000 mg/200 mL premix        1,000 mg 200 mL/hr over 60 Minutes Intravenous Every 24 hours 02/07/12 1459     02/03/12 2000   vancomycin (VANCOCIN) IVPB 1000 mg/200 mL premix  Status:  Discontinued        1,000 mg 200 mL/hr over 60 Minutes Intravenous Every 12 hours 02/03/12 0919 02/07/12 1459   02/03/12 1730   piperacillin-tazobactam (ZOSYN) IVPB 3.375 g  Status:  Discontinued        3.375 g 100 mL/hr over 30 Minutes Intravenous  Once 02/03/12 1723 02/03/12 1725   02/03/12 1730   vancomycin (VANCOCIN) IVPB 1000 mg/200 mL premix  Status:  Discontinued        1,000 mg 200 mL/hr over 60 Minutes Intravenous  Once 02/03/12 1723 02/03/12 1725   02/03/12 0100   piperacillin-tazobactam (ZOSYN) IVPB 3.375 g  Status:  Discontinued  3.375 g 12.5 mL/hr over 240 Minutes Intravenous Every 8 hours 02/02/12 2233 02/07/12 1458   02/02/12 1700   vancomycin (VANCOCIN) 1,250 mg in sodium chloride 0.9 % 250 mL IVPB   Status:  Discontinued        1,250 mg 166.7 mL/hr over 90 Minutes Intravenous Every 12 hours 02/02/12 1613 02/03/12 0919   02/02/12 1545  piperacillin-tazobactam (ZOSYN) 3.375 g in dextrose 5 % 50 mL IVPB       3.375 g 100 mL/hr over 30 Minutes Intravenous  Once 02/02/12 1530 02/02/12 1937         Assessment: 66 YOF on day 7 of empiric antibiotics for sepsis -? Foot source.   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 Afebrile, WBC 25.4 up. Vanco trough this am 15.8 in goal. Doses adjusted for rising SCr. Repeat UA negative. Wound clean. Reconsulting vascular. (?ID)  Abx:  5/14: Vanc >> 5/14: Zosyn >> 5/20 5/20: Imipenem >> Cx:  5/19 Resp- pending 5/19 Blood -pending 5/15: MRSA PCR: neg 5/15: Blood x2: ngtd 5/15: Urine: negative 5/15: Resp cx:  Goal of Therapy:  Clinical improvement  Plan:  Imipenem 250mg  IV q6hr.  Follow up cultures and renal function.  Continue Vancomycin at 1g q24h.   Link Snuffer, PharmD, BCPS Clinical Pharmacist [phone] 02/08/2012,10:47 AM

## 2012-02-08 NOTE — Progress Notes (Signed)
Family Medicine Teaching Service Attending Note  I discussed patient Alexandra Wiley  with Dr. Losq and reviewed their note for today.  I agree with their assessment and plan.      

## 2012-02-08 NOTE — Progress Notes (Signed)
Name: Alexandra Wiley MRN: [id number] DOB: 1945-12-12    LOS: 6  [facility] Note   History of Present Illness:  66 y/o BF, smoker with PMH of HTN, Gout, PVD, CHF, DM on insulin, spinal cord infarct (?walks with walker, neurogenic bladder) admitted on 5/14 by FPTS with a 3 week history of ulceration on R medial metatarsophalangeal joint area and heel.   Episode of asymptomatic hypotension 5/15 prompted transfer to ICU and concern for sepsis.    Lines / Drains: IJ TLC 5/15>>> ETT 5/18>>>  Cultures: 5/15 BCx2>>> neg 5/18 Sputum>>> 5/15 UC>>>neg 5/18 BC>>>  Antibiotics: 5/14 Vanco (osteo)>>> 5/14 Zosyn (oseto)>>>  Tests / Events: 5/14 Foot XRAY>>>Significant soft tissue swelling diffusely in right foot with osseous demineralization. Suspected area of bone destruction at the posterior margin of the calcaneus deep to the known wound suspicious for osteomyelitis. If further imaging is required recommend MR imaging with and without contrast. 5/14 R FOOT MRI>>>Posterior calcaneal osteomyelitis with adjacent cellulitis.  Suspected low-level fasciitis tracking along the margins of the plantar fascia. Dorsal subcutaneous edema in the foot probably represents cellulitis given the low-level associated enhancement.  No drainable abscess observed  5/18 CT head: Negative 5/19 MRI brain>>> Negative for acute infarct. Generalized atrophy without acute  intracranial abnormality.  5/19- EEG -diffuse slowing all leads, no focus 5/20- slow improvement neurostatus  Vital Signs: Temp:  [97.9 F (36.6 C)-98.4 F (36.9 C)] 97.9 F (36.6 C) (05/20 0400) Pulse Rate:  [49-140] 59  (05/20 0809) Resp:  [11-24] 18  (05/20 0809) BP: (92-152)/(36-74) 146/62 mmHg (05/20 0809) SpO2:  [50 %-100 %] 100 % (05/20 0809) FiO2 (%):  [39.7 %-40.7 %] 40 % (05/20 0809) Weight:  [85.5 kg (188 lb 7.9 oz)] 85.5 kg (188 lb 7.9 oz) (05/20 0500) I/O last 3 completed shifts: In: 5166.7 [I.V.:2894.2;  NG/GT:90; IV Piggyback:2182.5] Out: 1822 [Urine:1822]  Physical Examination: General:awakens, follows commands Neuro: nonfocal exam, unchanged CV: bradycardia no longer , s1 s2 2/6 murmur  PULM: improved coarse GI: round/soft, bsx4 active Extremities: cool/dry, chronic LE venous changes s/p R BKA  Labs    CBC  Lab 02/08/12 0425 02/07/12 0320 02/06/12 1230  HGB 7.9* 8.2* 8.0*  HCT 24.3* 25.7* 24.3*  WBC 25.4* 21.2* 17.0*  PLT 361 338 353   BMET  Lab 02/08/12 0425 02/07/12 0320 02/06/12 0511 02/05/12 0520 02/04/12 0400 02/03/12 1825  NA 142 140 136 139 134* --  K 3.7 4.1 -- -- -- --  CL 111 108 101 105 102 --  CO2 21 24 27 27 24  --  GLUCOSE 131* 189* 94 95 156* --  BUN 17 10 5* 4* 6 --  CREATININE 2.35* 2.19* 1.25* 0.72 0.62 --  CALCIUM 7.9* 7.6* 8.3* 8.7 8.1* --  MG -- -- -- -- 1.5 1.6  PHOS -- -- -- -- 3.3 3.5    Lab 02/05/12 0520 02/03/12 1825  INR 1.11 1.16     Lab 02/08/12 0455 02/06/12 1021 02/03/12 2130 02/03/12 1910  PHART 7.311* 7.375 -- --  PCO2ART 40.2 45.1* -- --  PO2ART 124.0* 74.0* -- --  HCO3 19.7* 26.4* -- --  TCO2 20.9 28 -- --  O2SAT 98.6 94.0 71.5 61.0    Radiology: CXR: bibasilr atx / edema, ett wnl Echo -HOCM, 65% 5/16  Assessment and Plan: A:Sepsis secondary to osteomyelitis of right foot -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 Lack of progress, repeat BC and prior all neg  A: SIRS, hypovolemia, severe spesis P: -volume up now, kvo -may need lasix  Acute resp failure -abg reviewed, increase MV when on rest -pcxr reviewed, ett wnl, basilar edema / atx?, consider lasix -ph this am noted, now on wean, cpap 5 ps 5, will need abg with this -may need even more improvement in neurostatus to extubate  A: Acute renal failure sCr doubled over last 24 hours, unclear if this is due to intermittent  hypotension, or sepsis  Fena .67% sugesting pre-renal cause.  Urine Osmo 272 Likely ATN, likely also hypovolemia contribution P: -cvp trend noted, has plat cvp 11, urine studies not helpful -echo on 16th was when she was dry, she is resuscitated and pos each day since, add lasix x 1  A: Non-gap acidosis  P: -change to 1/2 NS Chem in am   A: intermittent Bradycardia  P: -Tele asymptomatic Echo reviewed  A: Acute Encephalopathy secondary to unclear, likely this is multifactorial, sepsis, hypovolemia, narcotics, uremia CT and MRI and EEG negative  P: -f/u neuro recs -eeg reviewed -ct neg, MRI neg -treat metabolic / uremia/ toxic encephalaopthy  A: Leukocytosis P: Active infection? Other leg status? Ulcer, will call vasc to re assess Wound appears well Repeat lactic acid  A: Hx of HTN / CHF P: -hold anti hypertensives   A: Anemia- no indication for PRBCs at this time P: -monitor  A: Conus medullaris syndrome Plan:  -Per primary (family medicine)  A: DM Plan:  -Cont SSI - ACHS  A: Hx of Gout:  P: -monitor.   BEST PRACTICE / DISPOSITION  - Level of Care: icu  - Primary Service: pccm  - Consultants: vascular surg, neuro - Code Status: full  - Diet: NPO - DVT Px: heparin SQ - GI Px: ppi  - Social / Family: no family available   Ccm 30 min   [name]. [name], MD, FACP Pgr: [phone] [facility]

## 2012-02-09 ENCOUNTER — Inpatient Hospital Stay (HOSPITAL_COMMUNITY): Payer: Medicare Other

## 2012-02-09 DIAGNOSIS — R569 Unspecified convulsions: Secondary | ICD-10-CM

## 2012-02-09 LAB — AMMONIA: Ammonia: 23 umol/L (ref 11–60)

## 2012-02-09 LAB — DIFFERENTIAL
Basophils Absolute: 0 10*3/uL (ref 0.0–0.1)
Basophils Absolute: 0 10*3/uL (ref 0.0–0.1)
Basophils Relative: 0 % (ref 0–1)
Eosinophils Absolute: 0 10*3/uL (ref 0.0–0.7)
Eosinophils Relative: 0 % (ref 0–5)
Eosinophils Relative: 0 % (ref 0–5)
Lymphocytes Relative: 7 % — ABNORMAL LOW (ref 12–46)
Lymphocytes Relative: 8 % — ABNORMAL LOW (ref 12–46)
Lymphs Abs: 1.8 10*3/uL (ref 0.7–4.0)
Monocytes Absolute: 1.9 10*3/uL — ABNORMAL HIGH (ref 0.1–1.0)
Neutro Abs: 18.4 10*3/uL — ABNORMAL HIGH (ref 1.7–7.7)

## 2012-02-09 LAB — URINALYSIS, ROUTINE W REFLEX MICROSCOPIC
Glucose, UA: NEGATIVE mg/dL
Hgb urine dipstick: NEGATIVE
Leukocytes, UA: NEGATIVE
Protein, ur: NEGATIVE mg/dL
pH: 7 (ref 5.0–8.0)

## 2012-02-09 LAB — BASIC METABOLIC PANEL
CO2: 28 mEq/L (ref 19–32)
Calcium: 8.8 mg/dL (ref 8.4–10.5)
Chloride: 104 mEq/L (ref 96–112)
Creatinine, Ser: 2.11 mg/dL — ABNORMAL HIGH (ref 0.50–1.10)
Glucose, Bld: 164 mg/dL — ABNORMAL HIGH (ref 70–99)
Sodium: 142 mEq/L (ref 135–145)

## 2012-02-09 LAB — POCT I-STAT 3, ART BLOOD GAS (G3+)
Acid-base deficit: 2 mmol/L (ref 0.0–2.0)
Bicarbonate: 23.2 mEq/L (ref 20.0–24.0)
O2 Saturation: 98 %
TCO2: 24 mmol/L (ref 0–100)
pCO2 arterial: 39.5 mmHg (ref 35.0–45.0)
pO2, Arterial: 115 mmHg — ABNORMAL HIGH (ref 80.0–100.0)

## 2012-02-09 LAB — COMPREHENSIVE METABOLIC PANEL
Albumin: 2.3 g/dL — ABNORMAL LOW (ref 3.5–5.2)
Alkaline Phosphatase: 71 U/L (ref 39–117)
BUN: 22 mg/dL (ref 6–23)
Chloride: 104 mEq/L (ref 96–112)
Potassium: 3 mEq/L — ABNORMAL LOW (ref 3.5–5.1)
Total Bilirubin: 0.2 mg/dL — ABNORMAL LOW (ref 0.3–1.2)

## 2012-02-09 LAB — CBC
HCT: 26 % — ABNORMAL LOW (ref 36.0–46.0)
MCH: 26.7 pg (ref 26.0–34.0)
MCHC: 32.8 g/dL (ref 30.0–36.0)
MCV: 81.4 fL (ref 78.0–100.0)
MCV: 81 fL (ref 78.0–100.0)
Platelets: 427 10*3/uL — ABNORMAL HIGH (ref 150–400)
RBC: 3.21 MIL/uL — ABNORMAL LOW (ref 3.87–5.11)
RDW: 15.1 % (ref 11.5–15.5)
RDW: 15.1 % (ref 11.5–15.5)
WBC: 22.9 10*3/uL — ABNORMAL HIGH (ref 4.0–10.5)
WBC: 22.1 10*3/uL — ABNORMAL HIGH (ref 4.0–10.5)

## 2012-02-09 LAB — GLUCOSE, CAPILLARY
Glucose-Capillary: 166 mg/dL — ABNORMAL HIGH (ref 70–99)
Glucose-Capillary: 164 mg/dL — ABNORMAL HIGH (ref 70–99)
Glucose-Capillary: 147 mg/dL — ABNORMAL HIGH (ref 70–99)

## 2012-02-09 MED ORDER — OSMOLITE 1.2 CAL PO LIQD
1000.0000 mL | ORAL | Status: DC
  Filled 2012-02-09 (×2): qty 1000

## 2012-02-09 MED ORDER — PRO-STAT SUGAR FREE PO LIQD
30.0000 mL | Freq: Three times a day (TID) | ORAL | Status: DC
  Administered 2012-02-09 – 2012-02-14 (×16): 30 mL
  Filled 2012-02-09 (×20): qty 30

## 2012-02-09 MED ORDER — AMLODIPINE BESYLATE 5 MG PO TABS
5.0000 mg | ORAL_TABLET | Freq: Every day | ORAL | Status: DC
  Administered 2012-02-09 – 2012-02-23 (×15): 5 mg via ORAL
  Filled 2012-02-09 (×15): qty 1

## 2012-02-09 MED ORDER — PROMOTE PO LIQD
1000.0000 mL | ORAL | Status: DC
  Administered 2012-02-09 – 2012-02-14 (×7): 1000 mL
  Filled 2012-02-09 (×9): qty 1000

## 2012-02-09 MED ORDER — POTASSIUM CHLORIDE 20 MEQ/15ML (10%) PO LIQD
40.0000 meq | Freq: Once | ORAL | Status: AC
  Administered 2012-02-09: 40 meq
  Filled 2012-02-09: qty 30

## 2012-02-09 MED ORDER — INSULIN ASPART 100 UNIT/ML ~~LOC~~ SOLN
0.0000 [IU] | SUBCUTANEOUS | Status: DC
  Administered 2012-02-09: 3 [IU] via SUBCUTANEOUS
  Administered 2012-02-09 (×2): 2 [IU] via SUBCUTANEOUS
  Administered 2012-02-09 (×2): 3 [IU] via SUBCUTANEOUS
  Administered 2012-02-10: 5 [IU] via SUBCUTANEOUS
  Administered 2012-02-10: 3 [IU] via SUBCUTANEOUS
  Administered 2012-02-10: 5 [IU] via SUBCUTANEOUS
  Administered 2012-02-10: 3 [IU] via SUBCUTANEOUS
  Administered 2012-02-10 (×2): 5 [IU] via SUBCUTANEOUS
  Administered 2012-02-11: 3 [IU] via SUBCUTANEOUS
  Administered 2012-02-11: 5 [IU] via SUBCUTANEOUS
  Administered 2012-02-11: 3 [IU] via SUBCUTANEOUS
  Administered 2012-02-11: 5 [IU] via SUBCUTANEOUS
  Administered 2012-02-11 – 2012-02-12 (×6): 3 [IU] via SUBCUTANEOUS
  Administered 2012-02-12 (×2): 2 [IU] via SUBCUTANEOUS
  Administered 2012-02-13 (×5): 3 [IU] via SUBCUTANEOUS
  Administered 2012-02-14: 2 [IU] via SUBCUTANEOUS
  Administered 2012-02-14 (×2): 3 [IU] via SUBCUTANEOUS
  Administered 2012-02-14: 2 [IU] via SUBCUTANEOUS
  Administered 2012-02-14 (×2): 3 [IU] via SUBCUTANEOUS

## 2012-02-09 MED ORDER — HYDROCHLOROTHIAZIDE 12.5 MG PO CAPS
12.5000 mg | ORAL_CAPSULE | Freq: Every day | ORAL | Status: DC
  Administered 2012-02-09 – 2012-02-12 (×4): 12.5 mg via ORAL
  Filled 2012-02-09 (×5): qty 1

## 2012-02-09 MED ORDER — AMIODARONE HCL IN DEXTROSE 360-4.14 MG/200ML-% IV SOLN
INTRAVENOUS | Status: AC
  Filled 2012-02-09: qty 200

## 2012-02-09 MED ORDER — FUROSEMIDE 10 MG/ML IJ SOLN
40.0000 mg | Freq: Once | INTRAMUSCULAR | Status: AC
  Administered 2012-02-09: 40 mg via INTRAVENOUS
  Filled 2012-02-09: qty 4

## 2012-02-09 NOTE — Progress Notes (Addendum)
INFECTIOUS DISEASE PROGRESS NOTE  ID: Alexandra Wiley is a 66 y.o. female with   Principal Problem:  *Osteomyelitis of right foot Active Problems:  Conus medullaris syndrome  Septic shock  Oliguria  Diabetes mellitus  Altered mental status  Acute respiratory failure  Subjective: More awake, responds to voice  Abtx:  Anti-infectives     Start     Dose/Rate Route Frequency Ordered Stop   02/08/12 1200   imipenem-cilastatin (PRIMAXIN) 250 mg in sodium chloride 0.9 % 100 mL IVPB        250 mg 200 mL/hr over 30 Minutes Intravenous 4 times per day 02/08/12 1046     02/07/12 1500   piperacillin-tazobactam (ZOSYN) IVPB 2.25 g  Status:  Discontinued        2.25 g 100 mL/hr over 30 Minutes Intravenous 3 times per day 02/07/12 1458 02/08/12 1046   02/07/12 0800   vancomycin (VANCOCIN) IVPB 1000 mg/200 mL premix        1,000 mg 200 mL/hr over 60 Minutes Intravenous Every 24 hours 02/07/12 1459     02/03/12 2000   vancomycin (VANCOCIN) IVPB 1000 mg/200 mL premix  Status:  Discontinued        1,000 mg 200 mL/hr over 60 Minutes Intravenous Every 12 hours 02/03/12 0919 02/07/12 1459   02/03/12 1730   piperacillin-tazobactam (ZOSYN) IVPB 3.375 g  Status:  Discontinued        3.375 g 100 mL/hr over 30 Minutes Intravenous  Once 02/03/12 1723 02/03/12 1725   02/03/12 1730   vancomycin (VANCOCIN) IVPB 1000 mg/200 mL premix  Status:  Discontinued        1,000 mg 200 mL/hr over 60 Minutes Intravenous  Once 02/03/12 1723 02/03/12 1725   02/03/12 0100   piperacillin-tazobactam (ZOSYN) IVPB 3.375 g  Status:  Discontinued        3.375 g 12.5 mL/hr over 240 Minutes Intravenous Every 8 hours 02/02/12 2233 02/07/12 1458   02/02/12 1700   vancomycin (VANCOCIN) 1,250 mg in sodium chloride 0.9 % 250 mL IVPB  Status:  Discontinued        1,250 mg 166.7 mL/hr over 90 Minutes Intravenous Every 12 hours 02/02/12 1613 02/03/12 0919   02/02/12 1545  piperacillin-tazobactam (ZOSYN) 3.375 g in dextrose 5  % 50 mL IVPB       3.375 g 100 mL/hr over 30 Minutes Intravenous  Once 02/02/12 1530 02/02/12 1937          Medications:  Scheduled:   . antiseptic oral rinse  15 mL Mouth Rinse BID  . atorvastatin  10 mg Oral Daily  . baclofen  10 mg Oral BID  . chlorhexidine  15 mL Mouth/Throat BID  . furosemide  40 mg Intravenous Once  . furosemide  40 mg Intravenous Once  . gabapentin  300 mg Oral QID  . heparin  5,000 Units Subcutaneous Q8H  . hydrocortisone sodium succinate  50 mg Intravenous Q8H  . imipenem-cilastatin  250 mg Intravenous Q6H  . insulin aspart  0-15 Units Subcutaneous Q4H  . insulin aspart protamine-insulin aspart  12 Units Subcutaneous Q supper  . iohexol  20 mL Oral Q1 Hr x 2  . levothyroxine  38 mcg Intravenous Daily  . pantoprazole (PROTONIX) IV  40 mg Intravenous QHS  . potassium chloride  40 mEq Per Tube Once  . sodium chloride  3 mL Intravenous Q12H  . vancomycin  1,000 mg Intravenous Q24H  . DISCONTD: gabapentin  300 mg Oral QID  .  DISCONTD: insulin aspart  0-15 Units Subcutaneous TID WC  . DISCONTD: piperacillin-tazobactam (ZOSYN)  IV  2.25 g Intravenous Q8H    Objective: Vital signs in last 24 hours: Temp:  [98 F (36.7 C)-98.4 F (36.9 C)] 98.4 F (36.9 C) (05/21 0755) Pulse Rate:  [47-101] 90  (05/21 1000) Resp:  [9-27] 24  (05/21 1000) BP: (89-196)/(29-92) 178/69 mmHg (05/21 1000) SpO2:  [93 %-100 %] 99 % (05/21 1000) FiO2 (%):  [30 %-40.5 %] 30 % (05/21 1000) Weight:  [79.7 kg (175 lb 11.3 oz)] 79.7 kg (175 lb 11.3 oz) (05/21 0700)   General appearance: alert Neck: R IJ.  Resp: rhonchi bilaterally and mild Cardio: regular rate and rhythm GI: normal findings: bowel sounds normal and soft, non-tender Extremities: RLE dressed. LLE heal ulcer unchanged. foot warmer.  Neuro- grips to command on L but not right.   Lab Results  Basename 02/09/12 0600 02/08/12 0425  WBC 22.9* 25.4*  HGB 8.5* 7.9*  HCT 25.9* 24.3*  NA 141 142  K 3.0* 3.7  CL  104 111  CO2 24 21  BUN 22 17  CREATININE 2.25* 2.35*  GLU -- --   Liver Panel  Basename 02/09/12 0600  PROT 5.9*  ALBUMIN 2.3*  AST 17  ALT 16  ALKPHOS 71  BILITOT 0.2*  BILIDIR --  IBILI --   Sedimentation Rate No results found for this basename: ESRSEDRATE in the last 72 hours C-Reactive Protein No results found for this basename: CRP:2 in the last 72 hours  Microbiology: Recent Results (from the past 240 hour(s))  MRSA PCR SCREENING     Status: Normal   Collection Time   02/03/12  6:10 PM      Component Value Range Status Comment   MRSA by PCR NEGATIVE  NEGATIVE  Final   CULTURE, BLOOD (ROUTINE X 2)     Status: Normal (Preliminary result)   Collection Time   02/03/12  6:15 PM      Component Value Range Status Comment   Specimen Description BLOOD CENTRAL LINE   Final    Special Requests     Final    Value: BOTTLES DRAWN AEROBIC AND ANAEROBIC 10CC LEFT IJ CVC   Culture  Setup Time [id number]   Final    Culture     Final    Value:        BLOOD CULTURE RECEIVED NO GROWTH TO DATE CULTURE WILL BE HELD FOR 5 DAYS BEFORE ISSUING A FINAL NEGATIVE REPORT   Report Status PENDING   Incomplete   URINE CULTURE     Status: Normal   Collection Time   02/03/12  6:20 PM      Component Value Range Status Comment   Specimen Description URINE, CATHETERIZED   Final    Special Requests NONE   Final    Culture  Setup Time [id number]   Final    Colony Count NO GROWTH   Final    Culture NO GROWTH   Final    Report Status 02/05/2012 FINAL   Final   CULTURE, BLOOD (ROUTINE X 2)     Status: Normal (Preliminary result)   Collection Time   02/03/12  7:58 PM      Component Value Range Status Comment   Specimen Description BLOOD HAND RIGHT   Final    Special Requests BOTTLES DRAWN AEROBIC ONLY [facility]   Final    Culture  Setup Time [id number]   Final    Culture  Final    Value:        BLOOD CULTURE RECEIVED NO GROWTH TO DATE CULTURE WILL BE HELD FOR 5 DAYS BEFORE ISSUING A FINAL  NEGATIVE REPORT   Report Status PENDING   Incomplete   CULTURE, BLOOD (ROUTINE X 2)     Status: Normal (Preliminary result)   Collection Time   02/07/12 11:42 AM      Component Value Range Status Comment   Specimen Description BLOOD RIGHT HAND   Final    Special Requests BOTTLES DRAWN AEROBIC ONLY 3CC   Final    Culture  Setup Time [id number]   Final    Culture     Final    Value:        BLOOD CULTURE RECEIVED NO GROWTH TO DATE CULTURE WILL BE HELD FOR 5 DAYS BEFORE ISSUING A FINAL NEGATIVE REPORT   Report Status PENDING   Incomplete   CULTURE, BLOOD (ROUTINE X 2)     Status: Normal (Preliminary result)   Collection Time   02/07/12  1:37 PM      Component Value Range Status Comment   Specimen Description BLOOD LEFT HAND   Final    Special Requests BOTTLES DRAWN AEROBIC AND ANAEROBIC 3CC   Final    Culture  Setup Time [id number]   Final    Culture     Final    Value:        BLOOD CULTURE RECEIVED NO GROWTH TO DATE CULTURE WILL BE HELD FOR 5 DAYS BEFORE ISSUING A FINAL NEGATIVE REPORT   Report Status PENDING   Incomplete   CULTURE, RESPIRATORY     Status: Normal (Preliminary result)   Collection Time   02/07/12  2:22 PM      Component Value Range Status Comment   Specimen Description TRACHEAL ASPIRATE   Final    Special Requests NONE   Final    Gram Stain     Final    Value: FEW WBC PRESENT, PREDOMINANTLY PMN     NO SQUAMOUS EPITHELIAL CELLS SEEN     NO ORGANISMS SEEN   Culture Culture reincubated for better growth   Final    Report Status PENDING   Incomplete     Studies/Results: Ct Abdomen Pelvis Wo Contrast  02/08/2012  *RADIOLOGY REPORT*  Clinical Data:  Sepsis  CT CHEST, ABDOMEN AND PELVIS WITHOUT CONTRAST  Technique:  Multidetector CT imaging of the chest, abdomen and pelvis was performed following the standard protocol without IV contrast.  Comparison:  The 04/09/2004.  CT CHEST  Findings:  Endotracheal tube tip is seen in the mid trachea.  Right IJ central line tip  projects at the mid SVC level.  Feeding tube tip is in the mid stomach.  No axillary lymphadenopathy.  Scattered small lymph nodes are seen in the mediastinum.  No overt hilar lymphadenopathy.  The heart is enlarged.  Coronary artery calcification is evident.  No pericardial effusion.  Probable tiny bilateral pleural effusions.  Evaluation of fine detail in the lungs is obscured by patient breathing motion.  There is patchy airspace disease in the left upper lobe with bibasilar collapse / consolidation.  IMPRESSION: Patchy airspace disease in the left upper lobe suggest pneumonia. This is associated bibasilar collapse / consolidation.  Feeding tube tip is in the mid stomach.  CT ABDOMEN AND PELVIS  Findings:  Low attenuation in the liver parenchyma along the falciform ligament may be related to an area of fatty infiltration. No focal abnormality in  the spleen on this study performed without intravenous contrast material.  The duodenum, pancreas, and adrenal glands are unremarkable.  No stones are seen in either kidney. Mild fullness of the right intrarenal collecting system is evident and there appears to be some subtle right perinephric and proximal periureteric edema.  No abdominal aortic aneurysm.  No evidence for free fluid or lymphadenopathy in the abdomen.  There is no bowel obstruction.  There is a small amount of free fluid in the pelvis.  The patient appears to have some extraperitoneal edema/inflammation in the right retroperitoneal tissues tracking down into the right pelvic sidewall.  Foley catheter decompresses the urinary bladder.  Uterus is unremarkable.  No definite adnexal mass.  No pelvic sidewall lymphadenopathy.  Scattered diverticuli are seen in the sigmoid colon without diverticulitis.  Terminal ileum is unremarkable.  The retrocecal appendix is normal.  Bone windows show a sclerotic lesion in the left lesser trochanter, incompletely visualized.  Defect in the left iliac crest suggests site of  prior bone harvest. There is some body wall edema in the region of the pelvis and gas in the anterior subcutaneous fat of the lower left abdominal wall is presumably from injection site.  IMPRESSION: No definite findings to account for the reported history of clinical sepsis.  There is some fullness of the right intrarenal collecting system with right periureteric edema/inflammation and apparent fluid or inflammation in the right retroperitoneal tissues tracking down to the right pelvic sidewall.  No evidence for right psoas enlargement suggests the presence of an underlying psoas abscess although the evaluation is limited by the lack of intravenous contrast material. Recent right urinary stone passage or right pyelonephritis would be a consideration.  Sclerotic lesion in the left proximal femurs incompletely visualized.  This has relatively well defined margins were visualized and is probably benign.  Left hip films may prove helpful to further evaluate.  Original Report Authenticated By: ERIC A. MANSELL, M.D.   Ct Chest Wo Contrast  02/08/2012  *RADIOLOGY REPORT*  Clinical Data:  Sepsis  CT CHEST, ABDOMEN AND PELVIS WITHOUT CONTRAST  Technique:  Multidetector CT imaging of the chest, abdomen and pelvis was performed following the standard protocol without IV contrast.  Comparison:  The 04/09/2004.  CT CHEST  Findings:  Endotracheal tube tip is seen in the mid trachea.  Right IJ central line tip projects at the mid SVC level.  Feeding tube tip is in the mid stomach.  No axillary lymphadenopathy.  Scattered small lymph nodes are seen in the mediastinum.  No overt hilar lymphadenopathy.  The heart is enlarged.  Coronary artery calcification is evident.  No pericardial effusion.  Probable tiny bilateral pleural effusions.  Evaluation of fine detail in the lungs is obscured by patient breathing motion.  There is patchy airspace disease in the left upper lobe with bibasilar collapse / consolidation.  IMPRESSION:  Patchy airspace disease in the left upper lobe suggest pneumonia. This is associated bibasilar collapse / consolidation.  Feeding tube tip is in the mid stomach.  CT ABDOMEN AND PELVIS  Findings:  Low attenuation in the liver parenchyma along the falciform ligament may be related to an area of fatty infiltration. No focal abnormality in the spleen on this study performed without intravenous contrast material.  The duodenum, pancreas, and adrenal glands are unremarkable.  No stones are seen in either kidney. Mild fullness of the right intrarenal collecting system is evident and there appears to be some subtle right perinephric and proximal periureteric edema.  No abdominal aortic aneurysm.  No evidence for free fluid or lymphadenopathy in the abdomen.  There is no bowel obstruction.  There is a small amount of free fluid in the pelvis.  The patient appears to have some extraperitoneal edema/inflammation in the right retroperitoneal tissues tracking down into the right pelvic sidewall.  Foley catheter decompresses the urinary bladder.  Uterus is unremarkable.  No definite adnexal mass.  No pelvic sidewall lymphadenopathy.  Scattered diverticuli are seen in the sigmoid colon without diverticulitis.  Terminal ileum is unremarkable.  The retrocecal appendix is normal.  Bone windows show a sclerotic lesion in the left lesser trochanter, incompletely visualized.  Defect in the left iliac crest suggests site of prior bone harvest. There is some body wall edema in the region of the pelvis and gas in the anterior subcutaneous fat of the lower left abdominal wall is presumably from injection site.  IMPRESSION: No definite findings to account for the reported history of clinical sepsis.  There is some fullness of the right intrarenal collecting system with right periureteric edema/inflammation and apparent fluid or inflammation in the right retroperitoneal tissues tracking down to the right pelvic sidewall.  No evidence for  right psoas enlargement suggests the presence of an underlying psoas abscess although the evaluation is limited by the lack of intravenous contrast material. Recent right urinary stone passage or right pyelonephritis would be a consideration.  Sclerotic lesion in the left proximal femurs incompletely visualized.  This has relatively well defined margins were visualized and is probably benign.  Left hip films may prove helpful to further evaluate.  Original Report Authenticated By: ERIC A. MANSELL, M.D.   Mr Brain Wo Contrast  02/07/2012  *RADIOLOGY REPORT*  Clinical Data: CVA.  Mental status change  MRI HEAD WITHOUT CONTRAST  Technique:  Multiplanar, multiecho pulse sequences of the brain and surrounding structures were obtained according to standard protocol without intravenous contrast.  Comparison: CT head 02/06/2012  Findings: Negative for acute infarct.  Generalized atrophy.  No significant chronic ischemic changes are present.  Negative for hemorrhage or mass lesion.   Mild mastoid sinus effusion on the left.  Mild mucosal edema paranasal sinuses.  No air-fluid levels.  IMPRESSION: Negative for acute infarct.  Generalized atrophy without acute intracranial abnormality.  Original Report Authenticated By: Camelia Phenes, M.D.   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  Altered level of consciousness.  Question infection.  MRI OF THE RIGHT THIGH WITHOUT CONTRAST  Technique:  Multiplanar, multisequence MR imaging of the right thigh was performed.  No intravenous contrast was administered.  Comparison:  Limited comparison is made with pelvic CT 02/08/2012.  Findings:  Study was performed using the body coil.  Both thighs are included on the axial and coronal images.  The patient is status post distal femoral diaphyseal amputation.  The amputation site appears sharp.  Low-level marrow edema within the distal femoral marrow is  nonspecific. No cortical destruction is seen.  As partially imaged on preceding pelvic CT, there is endosteal sclerosis in the left femoral neck extending into the subtrochanteric region.  There is no associated periosteal reaction, marrow edema or focal surrounding soft tissue abnormality.  This is likely a longstanding process.  There is diffuse muscular atrophy in both thighs, especially within the right hamstring musculature.  There is nonspecific multi focal muscular edema, most notably within the right vastus medialis muscle.  Edema is present throughout the  left thigh musculature as well.  There is subcutaneous edema in both thighs, greater on the left.  No focal fluid collections are identified.  The visualized bony pelvis demonstrates no significant findings. There is a moderate amount of free pelvic fluid.  Foley catheter is in place.  IMPRESSION:  1.  No specific findings of osteomyelitis status post right above- the-knee amputation.  The amputation site has an expected appearance. 2.  Muscular and subcutaneous edema in both thighs, nonspecific. Myofasciitis not excluded.  There is no focal soft tissue abscess. 3.  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  Original Report Authenticated By: Gerrianne Scale, M.D.   Mr Mra Ext Low Left W/o Cm  02/09/2012  *RADIOLOGY REPORT*  Clinical Data:  Sepsis and status post right above knee amputation on the 02/05/2012 for gangrene of the right lower extremity.  MRA OF THE RIGHT AND LEFT LOWER EXTREMITIES WITHOUT CONTRAST  Technique:  Multiplanar multisequence MR imaging of the extremity was performed.  MRA sequences were obtained utilizing 2-D time-of- flight technique.  No intravenous contrast was administered due to renal failure.  Comparison:   None.  Findings:  The right lower extremity demonstrates patent flow via external and common femoral arteries.  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   Profunda femoral artery and its branches are open into the thigh and to the level of the above knee amputation.  The left lower extremity demonstrates patent external iliac and common femoral arteries.  SFA and profunda femoral arteries are open and show no obstructing lesions.  The popliteal artery is open.  Within the calf, patent flow is demonstrated initially via anterior tibial, posterior tibial and peroneal arteries.  These are able to be followed to the distal calf level.  The vessels are poorly defined at the level of the ankle and foot, likely due to slow flow and limitations of 2-D time-of-flight imaging.  IMPRESSION:  1.  Right lower extremity demonstrates SFA occlusion.  Profunda femoral artery branches are open to the level of above knee amputation. 2.  Left lower extremity demonstrates no significant proximal inflow disease.  Patent flow is demonstrated in the calf via tibial arteries.  Definition of tibial arteries at the level of the ankle and foot is limited, likely reflecting slow flow.  Original Report Authenticated By: Reola Calkins, M.D.   Mr Maxine Glenn Ext Low Right W/o Cm  02/09/2012  *RADIOLOGY REPORT*  Clinical Data:  Sepsis and status post right above knee amputation on the 02/05/2012 for gangrene of the right lower extremity.  MRA OF THE RIGHT AND LEFT LOWER EXTREMITIES WITHOUT CONTRAST  Technique:  Multiplanar multisequence MR imaging of the extremity was performed.  MRA sequences were obtained utilizing 2-D time-of- flight technique.  No intravenous contrast was administered due to renal failure.  Comparison:   None.  Findings:  The right lower extremity demonstrates patent flow via external and common femoral arteries.  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  Profunda femoral artery and its branches are open into the thigh and to the level of the above knee amputation.  The left lower extremity demonstrates patent external iliac and common femoral  arteries.  SFA and profunda femoral arteries are open and show no obstructing lesions.  The popliteal artery is open.  Within the calf, patent flow is demonstrated initially via anterior tibial, posterior tibial and peroneal arteries.  These are able to be followed to the distal calf  level.  The vessels are poorly defined at the level of the ankle and foot, likely due to slow flow and limitations of 2-D time-of-flight imaging.  IMPRESSION:  1.  Right lower extremity demonstrates SFA occlusion.  Profunda femoral artery branches are open to the level of above knee amputation. 2.  Left lower extremity demonstrates no significant proximal inflow disease.  Patent flow is demonstrated in the calf via tibial arteries.  Definition of tibial arteries at the level of the ankle and foot is limited, likely reflecting slow flow.  Original Report Authenticated By: Reola Calkins, M.D.   Dg Chest Port 1 View  02/09/2012  *RADIOLOGY REPORT*  Clinical Data: Evaluate endotracheal tube, shortness of breath  PORTABLE CHEST - 1 VIEW  Comparison: 02/08/2012; 02/07/2012; 02/06/2012; 02/05/2012; chest CT of 02/08/2012  Findings: Grossly unchanged enlarged cardiac silhouette and mediastinal contours.  Stable position of support apparatus.  No definite pneumothorax, though the right lung apex excluded secondary to overlying chin.  Small bilateral effusions and perihilar and basilar opacities are grossly unchanged.  Unchanged bones.  IMPRESSION: 1.  Stable positioning of support apparatus.  No pneumothorax. 2.  Unchanged small bilateral effusions and perihilar/bibasilar opacities, atelectasis versus infiltrate.  Original Report Authenticated By: Waynard Reeds, M.D.   Dg Chest Port 1 View  02/08/2012  *RADIOLOGY REPORT*  Clinical Data: Ventilator.  PORTABLE CHEST - 1 VIEW  Comparison: 02/07/2012  Findings: Worsening aeration of the lungs with increasing vascular congestion and bilateral perihilar/lower lobe opacities, likely edema  and/or atelectasis.  Low lung volumes.  Mild cardiomegaly.  IMPRESSION: Worsening aeration with increasing edema/atelectasis.  Original Report Authenticated By: Cyndie Chime, M.D.   Dg Chest Port 1 View  02/07/2012  *RADIOLOGY REPORT*  Clinical Data: Retracted endotracheal tube  PORTABLE CHEST - 1 VIEW  Comparison: 02/07/2012  Findings: Endotracheal tube has been pulled back from the right main bronchus and is now 5 mm above the carina.  It could be withdrawn and additional 2 cm.  Feeding tube is in the stomach.  Improved aeration with decrease in bilateral edema.  Bibasilar atelectasis remains.  Central venous catheter tip remains in the SVC.  IMPRESSION: Endotracheal tube has been withdrawn and is now 5 mm above the carina.  Improvement in bilateral edema.  Original Report Authenticated By: Camelia Phenes, M.D.     Assessment/Plan: Sepsis/SIRS Day Anbx (imipenem/vanco) CT suggesting pneumonia? MRI with ? Of myositis in thighs MRI/A does not show significant ischemia- has slow distal flow on L. Will defer to Dr Arbie Cookey.  BCx are negative.  WBC down slightly, Cr slightly improved as well.  Central line out when peripheral reasonable.  For renal u/s to eval ? Of hydro on her CT scan.    I discussed this at length with family and with Dr [name].   Johny Sax Infectious Diseases [phone] 02/09/2012, 10:06 AM   LOS: 7 days

## 2012-02-09 NOTE — Progress Notes (Signed)
Family Medicine Teaching Service Daily Progress Note: [id number] Subjective: S/p right AKA post op day #4. Remains intubated.   Objective: Vital signs in last 24 hours: Temp:  [98.1 F (36.7 C)-98.4 F (36.9 C)] 98.1 F (36.7 C) (05/21 1211) Pulse Rate:  [47-101] 84  (05/21 1300) Resp:  [9-27] 14  (05/21 1300) BP: (89-196)/(29-92) 150/58 mmHg (05/21 1300) SpO2:  [93 %-100 %] 98 % (05/21 1300) FiO2 (%):  [30 %-40.5 %] 30 % (05/21 1300) Weight:  [175 lb 11.3 oz (79.7 kg)] 175 lb 11.3 oz (79.7 kg) (05/21 0700) Weight change: -12 lb 12.6 oz (-5.8 kg) Last BM Date:  (requires manual bowel stimulation and disimpaction )  Intake/Output from previous day: 05/20 0701 - 05/21 0700 In: 2214.7 [I.V.:634.7; NG/GT:970; IV Piggyback:610] Out: 4317 [Urine:4317] Intake/Output this shift: Total I/O In: 519 [I.V.:80; NG/GT:135; IV Piggyback:304] Out: 1600 [Urine:1600] Constitutional: Intubated and sedated Cardiovascular: Normal rate and regular rhythm.  3/6 systolic murmur best heard at the right sternal border  Respiratory: Vent sounds.  GI: Soft. She exhibits no distension. There is no tenderness. There is no guarding.  Neurological: sedated Extremities: bandage clean and dry on right stump. Dressing in place on left heel   Lab Results:  Basename 02/09/12 1238 02/09/12 0600  WBC 22.1* 22.9*  HGB 8.7* 8.5*  HCT 26.0* 25.9*  PLT 431* 427*   BMET  Basename 02/09/12 0600 02/08/12 0425  NA 141 142  K 3.0* 3.7  CL 104 111  CO2 24 21  GLUCOSE 150* 131*  BUN 22 17  CREATININE 2.25* 2.35*  CALCIUM 8.5 7.9*    Studies/Results: Ct Abdomen Pelvis Wo Contrast  02/08/2012  *RADIOLOGY REPORT*  Clinical Data:  Sepsis  CT CHEST, ABDOMEN AND PELVIS WITHOUT CONTRAST  Technique:  Multidetector CT imaging of the chest, abdomen and pelvis was performed following the standard protocol without IV contrast.  Comparison:  The 04/09/2004.  CT CHEST  Findings:  Endotracheal tube tip is seen in the mid  trachea.  Right IJ central line tip projects at the mid SVC level.  Feeding tube tip is in the mid stomach.  No axillary lymphadenopathy.  Scattered small lymph nodes are seen in the mediastinum.  No overt hilar lymphadenopathy.  The heart is enlarged.  Coronary artery calcification is evident.  No pericardial effusion.  Probable tiny bilateral pleural effusions.  Evaluation of fine detail in the lungs is obscured by patient breathing motion.  There is patchy airspace disease in the left upper lobe with bibasilar collapse / consolidation.  IMPRESSION: Patchy airspace disease in the left upper lobe suggest pneumonia. This is associated bibasilar collapse / consolidation.  Feeding tube tip is in the mid stomach.  CT ABDOMEN AND PELVIS  Findings:  Low attenuation in the liver parenchyma along the falciform ligament may be related to an area of fatty infiltration. No focal abnormality in the spleen on this study performed without intravenous contrast material.  The duodenum, pancreas, and adrenal glands are unremarkable.  No stones are seen in either kidney. Mild fullness of the right intrarenal collecting system is evident and there appears to be some subtle right perinephric and proximal periureteric edema.  No abdominal aortic aneurysm.  No evidence for free fluid or lymphadenopathy in the abdomen.  There is no bowel obstruction.  There is a small amount of free fluid in the pelvis.  The patient appears to have some extraperitoneal edema/inflammation in the right retroperitoneal tissues tracking down into the right pelvic sidewall.  Foley catheter decompresses the urinary bladder.  Uterus is unremarkable.  No definite adnexal mass.  No pelvic sidewall lymphadenopathy.  Scattered diverticuli are seen in the sigmoid colon without diverticulitis.  Terminal ileum is unremarkable.  The retrocecal appendix is normal.  Bone windows show a sclerotic lesion in the left lesser trochanter, incompletely visualized.  Defect in  the left iliac crest suggests site of prior bone harvest. There is some body wall edema in the region of the pelvis and gas in the anterior subcutaneous fat of the lower left abdominal wall is presumably from injection site.  IMPRESSION: No definite findings to account for the reported history of clinical sepsis.  There is some fullness of the right intrarenal collecting system with right periureteric edema/inflammation and apparent fluid or inflammation in the right retroperitoneal tissues tracking down to the right pelvic sidewall.  No evidence for right psoas enlargement suggests the presence of an underlying psoas abscess although the evaluation is limited by the lack of intravenous contrast material. Recent right urinary stone passage or right pyelonephritis would be a consideration.  Sclerotic lesion in the left proximal femurs incompletely visualized.  This has relatively well defined margins were visualized and is probably benign.  Left hip films may prove helpful to further evaluate.  Original Report Authenticated By: ERIC A. MANSELL, M.D.   Ct Chest Wo Contrast  02/08/2012  *RADIOLOGY REPORT*  Clinical Data:  Sepsis  CT CHEST, ABDOMEN AND PELVIS WITHOUT CONTRAST  Technique:  Multidetector CT imaging of the chest, abdomen and pelvis was performed following the standard protocol without IV contrast.  Comparison:  The 04/09/2004.  CT CHEST  Findings:  Endotracheal tube tip is seen in the mid trachea.  Right IJ central line tip projects at the mid SVC level.  Feeding tube tip is in the mid stomach.  No axillary lymphadenopathy.  Scattered small lymph nodes are seen in the mediastinum.  No overt hilar lymphadenopathy.  The heart is enlarged.  Coronary artery calcification is evident.  No pericardial effusion.  Probable tiny bilateral pleural effusions.  Evaluation of fine detail in the lungs is obscured by patient breathing motion.  There is patchy airspace disease in the left upper lobe with bibasilar  collapse / consolidation.  IMPRESSION: Patchy airspace disease in the left upper lobe suggest pneumonia. This is associated bibasilar collapse / consolidation.  Feeding tube tip is in the mid stomach.  CT ABDOMEN AND PELVIS  Findings:  Low attenuation in the liver parenchyma along the falciform ligament may be related to an area of fatty infiltration. No focal abnormality in the spleen on this study performed without intravenous contrast material.  The duodenum, pancreas, and adrenal glands are unremarkable.  No stones are seen in either kidney. Mild fullness of the right intrarenal collecting system is evident and there appears to be some subtle right perinephric and proximal periureteric edema.  No abdominal aortic aneurysm.  No evidence for free fluid or lymphadenopathy in the abdomen.  There is no bowel obstruction.  There is a small amount of free fluid in the pelvis.  The patient appears to have some extraperitoneal edema/inflammation in the right retroperitoneal tissues tracking down into the right pelvic sidewall.  Foley catheter decompresses the urinary bladder.  Uterus is unremarkable.  No definite adnexal mass.  No pelvic sidewall lymphadenopathy.  Scattered diverticuli are seen in the sigmoid colon without diverticulitis.  Terminal ileum is unremarkable.  The retrocecal appendix is normal.  Bone windows show a sclerotic lesion in the  left lesser trochanter, incompletely visualized.  Defect in the left iliac crest suggests site of prior bone harvest. There is some body wall edema in the region of the pelvis and gas in the anterior subcutaneous fat of the lower left abdominal wall is presumably from injection site.  IMPRESSION: No definite findings to account for the reported history of clinical sepsis.  There is some fullness of the right intrarenal collecting system with right periureteric edema/inflammation and apparent fluid or inflammation in the right retroperitoneal tissues tracking down to the  right pelvic sidewall.  No evidence for right psoas enlargement suggests the presence of an underlying psoas abscess although the evaluation is limited by the lack of intravenous contrast material. Recent right urinary stone passage or right pyelonephritis would be a consideration.  Sclerotic lesion in the left proximal femurs incompletely visualized.  This has relatively well defined margins were visualized and is probably benign.  Left hip films may prove helpful to further evaluate.  Original Report Authenticated By: ERIC A. MANSELL, M.D.   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  Altered level of consciousness.  Question infection.  MRI OF THE RIGHT THIGH WITHOUT CONTRAST  Technique:  Multiplanar, multisequence MR imaging of the right thigh was performed.  No intravenous contrast was administered.  Comparison:  Limited comparison is made with pelvic CT 02/08/2012.  Findings:  Study was performed using the body coil.  Both thighs are included on the axial and coronal images.  The patient is status post distal femoral diaphyseal amputation.  The amputation site appears sharp.  Low-level marrow edema within the distal femoral marrow is nonspecific. No cortical destruction is seen.  As partially imaged on preceding pelvic CT, there is endosteal sclerosis in the left femoral neck extending into the subtrochanteric region.  There is no associated periosteal reaction, marrow edema or focal surrounding soft tissue abnormality.  This is likely a longstanding process.  There is diffuse muscular atrophy in both thighs, especially within the right hamstring musculature.  There is nonspecific multi focal muscular edema, most notably within the right vastus medialis muscle.  Edema is present throughout the left thigh musculature as well.  There is subcutaneous edema in both thighs, greater on the left.  No focal fluid collections are identified.   The visualized bony pelvis demonstrates no significant findings. There is a moderate amount of free pelvic fluid.  Foley catheter is in place.  IMPRESSION:  1.  No specific findings of osteomyelitis status post right above- the-knee amputation.  The amputation site has an expected appearance. 2.  Muscular and subcutaneous edema in both thighs, nonspecific. Myofasciitis not excluded.  There is no focal soft tissue abscess. 3.  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  Original Report Authenticated By: Gerrianne Scale, M.D.   Mr Mra Ext Low Left W/o Cm  02/09/2012  *RADIOLOGY REPORT*  Clinical Data:  Sepsis and status post right above knee amputation on the 02/05/2012 for gangrene of the right lower extremity.  MRA OF THE RIGHT AND LEFT LOWER EXTREMITIES WITHOUT CONTRAST  Technique:  Multiplanar multisequence MR imaging of the extremity was performed.  MRA sequences were obtained utilizing 2-D time-of- flight technique.  No intravenous contrast was administered due to renal failure.  Comparison:   None.  Findings:  The right lower extremity demonstrates patent flow via external and common femoral arteries.  The superficial femoral artery appears chronically occluded at  its origin.  Profunda femoral artery and its branches are open into the thigh and to the level of the above knee amputation.  The left lower extremity demonstrates patent external iliac and common femoral arteries.  SFA and profunda femoral arteries are open and show no obstructing lesions.  The popliteal artery is open.  Within the calf, patent flow is demonstrated initially via anterior tibial, posterior tibial and peroneal arteries.  These are able to be followed to the distal calf level.  The vessels are poorly defined at the level of the ankle and foot, likely due to slow flow and limitations of 2-D time-of-flight imaging.  IMPRESSION:  1.  Right lower extremity demonstrates SFA occlusion.  Profunda femoral artery  branches are open to the level of above knee amputation. 2.  Left lower extremity demonstrates no significant proximal inflow disease.  Patent flow is demonstrated in the calf via tibial arteries.  Definition of tibial arteries at the level of the ankle and foot is limited, likely reflecting slow flow.  Original Report Authenticated By: Reola Calkins, M.D.   Mr Maxine Glenn Ext Low Right W/o Cm  02/09/2012  *RADIOLOGY REPORT*  Clinical Data:  Sepsis and status post right above knee amputation on the 02/05/2012 for gangrene of the right lower extremity.  MRA OF THE RIGHT AND LEFT LOWER EXTREMITIES WITHOUT CONTRAST  Technique:  Multiplanar multisequence MR imaging of the extremity was performed.  MRA sequences were obtained utilizing 2-D time-of- flight technique.  No intravenous contrast was administered due to renal failure.  Comparison:   None.  Findings:  The right lower extremity demonstrates patent flow via external and common femoral arteries.  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  Profunda femoral artery and its branches are open into the thigh and to the level of the above knee amputation.  The left lower extremity demonstrates patent external iliac and common femoral arteries.  SFA and profunda femoral arteries are open and show no obstructing lesions.  The popliteal artery is open.  Within the calf, patent flow is demonstrated initially via anterior tibial, posterior tibial and peroneal arteries.  These are able to be followed to the distal calf level.  The vessels are poorly defined at the level of the ankle and foot, likely due to slow flow and limitations of 2-D time-of-flight imaging.  IMPRESSION:  1.  Right lower extremity demonstrates SFA occlusion.  Profunda femoral artery branches are open to the level of above knee amputation. 2.  Left lower extremity demonstrates no significant proximal inflow disease.  Patent flow is demonstrated in the calf via tibial  arteries.  Definition of tibial arteries at the level of the ankle and foot is limited, likely reflecting slow flow.  Original Report Authenticated By: Reola Calkins, M.D.   Dg Chest Port 1 View  02/09/2012  *RADIOLOGY REPORT*  Clinical Data: Evaluate endotracheal tube, shortness of breath  PORTABLE CHEST - 1 VIEW  Comparison: 02/08/2012; 02/07/2012; 02/06/2012; 02/05/2012; chest CT of 02/08/2012  Findings: Grossly unchanged enlarged cardiac silhouette and mediastinal contours.  Stable position of support apparatus.  No definite pneumothorax, though the right lung apex excluded secondary to overlying chin.  Small bilateral effusions and perihilar and basilar opacities are grossly unchanged.  Unchanged bones.  IMPRESSION: 1.  Stable positioning of support apparatus.  No pneumothorax. 2.  Unchanged small bilateral effusions and perihilar/bibasilar opacities, atelectasis versus infiltrate.  Original Report Authenticated By: Waynard Reeds, M.D.   Dg Chest [facility]  1 View  02/08/2012  *RADIOLOGY REPORT*  Clinical Data: Ventilator.  PORTABLE CHEST - 1 VIEW  Comparison: 02/07/2012  Findings: Worsening aeration of the lungs with increasing vascular congestion and bilateral perihilar/lower lobe opacities, likely edema and/or atelectasis.  Low lung volumes.  Mild cardiomegaly.  IMPRESSION: Worsening aeration with increasing edema/atelectasis.  Original Report Authenticated By: Cyndie Chime, M.D.    Medications:  Scheduled:    . amLODipine  5 mg Oral Daily  . antiseptic oral rinse  15 mL Mouth Rinse BID  . atorvastatin  10 mg Oral Daily  . baclofen  10 mg Oral BID  . chlorhexidine  15 mL Mouth/Throat BID  . feeding supplement  30 mL Per Tube TID WC  . feeding supplement (PROMOTE)  1,000 mL Per Tube Q24H  . furosemide  40 mg Intravenous Once  . gabapentin  300 mg Oral QID  . heparin  5,000 Units Subcutaneous Q8H  . hydrochlorothiazide  12.5 mg Oral Daily  . hydrocortisone sodium succinate  50 mg  Intravenous Q8H  . imipenem-cilastatin  250 mg Intravenous Q6H  . insulin aspart  0-15 Units Subcutaneous Q4H  . insulin aspart protamine-insulin aspart  12 Units Subcutaneous Q supper  . levothyroxine  38 mcg Intravenous Daily  . pantoprazole (PROTONIX) IV  40 mg Intravenous QHS  . potassium chloride  40 mEq Per Tube Once  . sodium chloride  3 mL Intravenous Q12H  . vancomycin  1,000 mg Intravenous Q24H  . DISCONTD: feeding supplement (OSMOLITE 1.2 CAL)  1,000 mL Per Tube Q24H  . 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 Currently on CCM service due to concern for sepsis. Was re-intubated yesterday because of unresponsiveness, decreased sensorium.   # sepsis in the setting of left posterior calcaneal osteomyelitis:  S/p right AKA. Post op day 4 - blood cultures negative to date - possible pneumonia on cxr. - continue vanc (start 5/14) and imipenem and cilastatin (start: 5/20) - appreciate ID consult.  - ID recommends: renal US for evaluation of hydronephrosis seen on CT scan  # acute encephalopathy:  CCM managing. Patient intubated and sedated for airway protection. Neurology on board. Negative MRI, CT. EEG showing moderate encephalopathy of nonspecific etiology. Neuro will repeat EEG for confirmation.  ammonia level normal today  # Peripheral vascular disease:  - continue to monitor left heal and foot  - follow up vascular surgery recommendations for potential revascularization of left leg in the future.   # Acute renal insufficiency: likely from hypovolemia and ATN. Creatinine stabilizing at 2.25 from 2.35 yesterday. Continue monitoring.   # Acute Respiratory failure: plan by CCM to wean  vent settings. Repeat CXR in am.  # Diabetes:  CBG (last 3)   Basename 02/09/12 1209 02/09/12 0752 02/09/12 0416  GLUCAP 164* 166* 135*   On NPH at 12u daily and insulin sliding scale.  - sliding scale coverage  # h/o spinal cord infarct:  - continue home meds for spasms and pain: baclofen 10bid, gabapentin 300 qid # diastolic CHF grade 2 diastolic dysfunction with EF of 60-65% on May 2012 echo Elevated BNP at 9763. Received dose of IV lasix 40.  # h/o gout: hold allopurinol # h/o HTN: high BP at 196/92 - BP meds restarted today #h/o  hypothyroid:  - continue home levothyroxine  - normal TSH PPx:  - heparin sub q  PPI Fen/Gi: npo Dispo: currently on CCM's service.    LOS: 7 days   Marena Chancy  MD 02/09/2012, 2:05 PM

## 2012-02-09 NOTE — Progress Notes (Signed)
Portable EEG completed, no family members in room at time of EEG.

## 2012-02-09 NOTE — Progress Notes (Signed)
Name: MADISAN BICE MRN: [id number] DOB: April 20, 1946    LOS: 7  [place] [facility] Note   History of Present Illness:  66 y/o BF, smoker with PMH of HTN, Gout, PVD, CHF, DM on insulin, spinal cord infarct (?walks with walker, neurogenic bladder) admitted on 5/14 by FPTS with a 3 week history of ulceration on R medial metatarsophalangeal joint area and heel.   Episode of asymptomatic hypotension 5/15 prompted transfer to ICU and concern for sepsis.    Lines / Drains: IJ TLC 5/15>>> ETT 5/18>>>  Cultures: 5/15 BCx2>>> neg 5/18 Sputum>>> 5/15 UC>>>neg 5/18 BC>>>  Antibiotics: 5/14 Vanco (osteo)>>> 5/14 Zosyn (oseto)>>>5/20 iMIPENEM 5/20>>>  Tests / Events: 5/14 Foot XRAY>>>Significant soft tissue swelling diffusely in right foot with osseous demineralization. Suspected area of bone destruction at the posterior margin of the calcaneus deep to the known wound suspicious for osteomyelitis. If further imaging is required recommend MR imaging with and without contrast. 5/14 R FOOT MRI>>>Posterior calcaneal osteomyelitis with adjacent cellulitis.  Suspected low-level fasciitis tracking along the margins of the plantar fascia. Dorsal subcutaneous edema in the foot probably represents cellulitis given the low-level associated enhancement.  No drainable abscess observed  5/18 CT head: Negative 5/19 MRI brain>>> Negative for acute infarct. Generalized atrophy without acute  intracranial abnormality.  5/19- EEG -diffuse slowing all leads, no focus 5/20- slow improvement neurostatus 5/20 imipenem started 5/20- ct done, MRI done, neg balance, improved crt  Vital Signs: Temp:  [98 F (36.7 C)-98.4 F (36.9 C)] 98.4 F (36.9 C) (05/21 0755) Pulse Rate:  [47-101] 87  (05/21 0900) Resp:  [8-27] 25  (05/21 0900) BP: (89-196)/(29-92) 186/70 mmHg (05/21 0900) SpO2:  [93 %-100 %] 100 % (05/21 0900) FiO2 (%):  [30 %-40.5 %] 30 % (05/21 0900) Weight:  [79.7 kg (175 lb 11.3 oz)]  79.7 kg (175 lb 11.3 oz) (05/21 0700) I/O last 3 completed shifts: In: 3644.7 [I.V.:1954.7; NG/GT:970; IV Piggyback:720] Out: 4694 [Urine:4694]  Physical Examination: General:awakens, follows commands, more alert Neuro: nonfocal exam, more alert CV: bradycardia no longer , s1 s2 2/6 murmur  PULM: improved coarse better GI: round/soft, bsx4 active Extremities: cool/dry, chronic LE venous changes s/p R BKA no chnags, dop pulses  Labs    CBC  Lab 02/09/12 0600 02/08/12 0425 02/07/12 0320  HGB 8.5* 7.9* 8.2*  HCT 25.9* 24.3* 25.7*  WBC 22.9* 25.4* 21.2*  PLT 427* 361 338   BMET  Lab 02/09/12 0600 02/08/12 0425 02/07/12 0320 02/06/12 0511 02/05/12 0520 02/04/12 0400 02/03/12 1825  NA 141 142 140 136 139 -- --  K 3.0* 3.7 -- -- -- -- --  CL 104 111 108 101 105 -- --  CO2 24 21 24 27 27  -- --  GLUCOSE 150* 131* 189* 94 95 -- --  BUN 22 17 10  5* 4* -- --  CREATININE 2.25* 2.35* 2.19* 1.25* 0.72 -- --  CALCIUM 8.5 7.9* 7.6* 8.3* 8.7 -- --  MG -- -- -- -- -- 1.5 1.6  PHOS -- -- -- -- -- 3.3 3.5    Lab 02/05/12 0520 02/03/12 1825  INR 1.11 1.16     Lab 02/09/12 0329 02/08/12 0455 02/06/12 1021 02/03/12 2130 02/03/12 1910  PHART 7.377 7.311* 7.375 -- --  PCO2ART 39.5 40.2 45.1* -- --  PO2ART 115.0* 124.0* 74.0* -- --  HCO3 23.2 19.7* 26.4* -- --  TCO2 24 20.9 28 -- --  O2SAT 98.0 98.6 94.0 71.5 61.0    Radiology: CXR: bibasilr  atx / edema, ett wnl Echo -HOCM, 65% 5/16 MR R Femur 5/20: negative for abscess MR/MRA L Lower ext 5/20: negative CT chest ABD Pelvis 5/20: Patchy left lower lobe airspace disease , negative otherwise   Assessment and Plan: A:Sepsis secondary to osteomyelitis of right foot -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 Plan:  -unimpressed aspiration, also even after review of CT chest -Cont Vanc and Imipenem - fevers more controlled, WBC trending down  CT reviewed, MRi reviewed, low flow inflammation left contribution? -will correlate US renals   A: SIRS, hypovolemia,  severe spesis- resolved P: -volume up now, kvo -lasix repeat dose -repeat UA  Acute resp failure -CT unimpressive PNA -wean cpap 5 ps 5, assess rsbi, abg at 1 hr - appears clinically improved -pcx r in am  -neg balance as goal Neuro status improved  A: Acute renal failure sCr doubled over last 24 hours, unclear if this is due to intermittent hypotension, or sepsis  Fena .67% sugesting pre-renal cause.  Urine Osmo 272 Likely ATN, likely also hypovolemia contribution P: -f/u sCr, good UOP now  crt trend down, repeat lasix, treat volume status  A: intermittent Bradycardia  P: -Tele asymptomatic Echo reviewed  A: Acute Encephalopathy secondary to unclear, likely this is multifactorial, sepsis, hypovolemia, narcotics, uremia CT and MRI and EEG negative  P: -f/u neuro recs -eeg reviewed -ct neg, MRI neg -treat metabolic / uremia/ toxic encephalopathy Slow improvement  A: Leukocytosis P: Wound appears well Improved Lactic o.8 x 2  A: Hx of HTN / CHF P: -start home norvasc, HCTZ  A: Anemia- no indication for PRBCs at this time P: -monitor L;asix, watch HCT rise  A: Conus medullaris syndrome Plan:  -Per primary (family medicine), d/w with them this am , updated  A: DM Plan:  -Cont SSI - ACHS  A: Hx of Gout:  P: -monitor symptoms, if pain then steroids   BEST PRACTICE / DISPOSITION  - Level of Care: icu  - Primary Service: pccm  - Consultants: vascular surg, neuro - Code Status: full  - Diet: if not extubated then feed - DVT Px: heparin SQ - GI Px: ppi  - Social / Family:family updated extensive 5/20, 5/21  Ccm 30 min   [name]. [name], MD, FACP Pgr: [phone] [facility]

## 2012-02-09 NOTE — Progress Notes (Signed)
Subjective: Interval History: none.. Alert on vent. Denies pain  Objective: Vital signs in last 24 hours: Temp:  [98 F (36.7 C)-98.4 F (36.9 C)] 98.4 F (36.9 C) (05/21 0417) Pulse Rate:  [47-101] 75  (05/21 0700) Resp:  [8-27] 20  (05/21 0700) BP: (89-196)/(29-92) 169/53 mmHg (05/21 0700) SpO2:  [93 %-100 %] 100 % (05/21 0700) FiO2 (%):  [39.7 %-40.5 %] 40 % (05/21 0700) Weight:  [175 lb 11.3 oz (79.7 kg)] 175 lb 11.3 oz (79.7 kg) (05/21 0700)  Intake/Output from previous day: 05/20 0701 - 05/21 0700 In: 2214.7 [I.V.:634.7; NG/GT:970; IV Piggyback:610] Out: 4317 [Urine:4317] Intake/Output this shift:    Extremities: Left heel looks good. Partial thickness skin loss  Lab Results:  Basename 02/09/12 0600 02/08/12 0425  WBC 22.9* 25.4*  HGB 8.5* 7.9*  HCT 25.9* 24.3*  PLT 427* 361   BMET  Basename 02/09/12 0600 02/08/12 0425  NA 141 142  K 3.0* 3.7  CL 104 111  CO2 24 21  GLUCOSE 150* 131*  BUN 22 17  CREATININE 2.25* 2.35*  CALCIUM 8.5 7.9*    Studies/Results: Ct Abdomen Pelvis Wo Contrast  02/08/2012  *RADIOLOGY REPORT*  Clinical Data:  Sepsis  CT CHEST, ABDOMEN AND PELVIS WITHOUT CONTRAST  Technique:  Multidetector CT imaging of the chest, abdomen and pelvis was performed following the standard protocol without IV contrast.  Comparison:  The 04/09/2004.  CT CHEST  Findings:  Endotracheal tube tip is seen in the mid trachea.  Right IJ central line tip projects at the mid SVC level.  Feeding tube tip is in the mid stomach.  No axillary lymphadenopathy.  Scattered small lymph nodes are seen in the mediastinum.  No overt hilar lymphadenopathy.  The heart is enlarged.  Coronary artery calcification is evident.  No pericardial effusion.  Probable tiny bilateral pleural effusions.  Evaluation of fine detail in the lungs is obscured by patient breathing motion.  There is patchy airspace disease in the left upper lobe with bibasilar collapse / consolidation.  IMPRESSION:  Patchy airspace disease in the left upper lobe suggest pneumonia. This is associated bibasilar collapse / consolidation.  Feeding tube tip is in the mid stomach.  CT ABDOMEN AND PELVIS  Findings:  Low attenuation in the liver parenchyma along the falciform ligament may be related to an area of fatty infiltration. No focal abnormality in the spleen on this study performed without intravenous contrast material.  The duodenum, pancreas, and adrenal glands are unremarkable.  No stones are seen in either kidney. Mild fullness of the right intrarenal collecting system is evident and there appears to be some subtle right perinephric and proximal periureteric edema.  No abdominal aortic aneurysm.  No evidence for free fluid or lymphadenopathy in the abdomen.  There is no bowel obstruction.  There is a small amount of free fluid in the pelvis.  The patient appears to have some extraperitoneal edema/inflammation in the right retroperitoneal tissues tracking down into the right pelvic sidewall.  Foley catheter decompresses the urinary bladder.  Uterus is unremarkable.  No definite adnexal mass.  No pelvic sidewall lymphadenopathy.  Scattered diverticuli are seen in the sigmoid colon without diverticulitis.  Terminal ileum is unremarkable.  The retrocecal appendix is normal.  Bone windows show a sclerotic lesion in the left lesser trochanter, incompletely visualized.  Defect in the left iliac crest suggests site of prior bone harvest. There is some body wall edema in the region of the pelvis and gas in the anterior subcutaneous  fat of the lower left abdominal wall is presumably from injection site.  IMPRESSION: No definite findings to account for the reported history of clinical sepsis.  There is some fullness of the right intrarenal collecting system with right periureteric edema/inflammation and apparent fluid or inflammation in the right retroperitoneal tissues tracking down to the right pelvic sidewall.  No evidence for  right psoas enlargement suggests the presence of an underlying psoas abscess although the evaluation is limited by the lack of intravenous contrast material. Recent right urinary stone passage or right pyelonephritis would be a consideration.  Sclerotic lesion in the left proximal femurs incompletely visualized.  This has relatively well defined margins were visualized and is probably benign.  Left hip films may prove helpful to further evaluate.  Original Report Authenticated By: ERIC A. MANSELL, M.D.   Ct Head Wo Contrast  02/06/2012  *RADIOLOGY REPORT*  Clinical Data: Not waking up after surgery.  Rule out stroke.  CT HEAD WITHOUT CONTRAST  Technique:  Contiguous axial images were obtained from the base of the skull through the vertex without contrast.  Comparison: CT 11/18/2008  Findings: Age appropriate atrophy.  No acute infarct, hemorrhage, or mass.  Calvarium is intact.  IMPRESSION: No acute abnormality.  Original Report Authenticated By: Camelia Phenes, M.D.   Ct Chest Wo Contrast  02/08/2012  *RADIOLOGY REPORT*  Clinical Data:  Sepsis  CT CHEST, ABDOMEN AND PELVIS WITHOUT CONTRAST  Technique:  Multidetector CT imaging of the chest, abdomen and pelvis was performed following the standard protocol without IV contrast.  Comparison:  The 04/09/2004.  CT CHEST  Findings:  Endotracheal tube tip is seen in the mid trachea.  Right IJ central line tip projects at the mid SVC level.  Feeding tube tip is in the mid stomach.  No axillary lymphadenopathy.  Scattered small lymph nodes are seen in the mediastinum.  No overt hilar lymphadenopathy.  The heart is enlarged.  Coronary artery calcification is evident.  No pericardial effusion.  Probable tiny bilateral pleural effusions.  Evaluation of fine detail in the lungs is obscured by patient breathing motion.  There is patchy airspace disease in the left upper lobe with bibasilar collapse / consolidation.  IMPRESSION: Patchy airspace disease in the left upper lobe  suggest pneumonia. This is associated bibasilar collapse / consolidation.  Feeding tube tip is in the mid stomach.  CT ABDOMEN AND PELVIS  Findings:  Low attenuation in the liver parenchyma along the falciform ligament may be related to an area of fatty infiltration. No focal abnormality in the spleen on this study performed without intravenous contrast material.  The duodenum, pancreas, and adrenal glands are unremarkable.  No stones are seen in either kidney. Mild fullness of the right intrarenal collecting system is evident and there appears to be some subtle right perinephric and proximal periureteric edema.  No abdominal aortic aneurysm.  No evidence for free fluid or lymphadenopathy in the abdomen.  There is no bowel obstruction.  There is a small amount of free fluid in the pelvis.  The patient appears to have some extraperitoneal edema/inflammation in the right retroperitoneal tissues tracking down into the right pelvic sidewall.  Foley catheter decompresses the urinary bladder.  Uterus is unremarkable.  No definite adnexal mass.  No pelvic sidewall lymphadenopathy.  Scattered diverticuli are seen in the sigmoid colon without diverticulitis.  Terminal ileum is unremarkable.  The retrocecal appendix is normal.  Bone windows show a sclerotic lesion in the left lesser trochanter, incompletely visualized.  Defect in the left iliac crest suggests site of prior bone harvest. There is some body wall edema in the region of the pelvis and gas in the anterior subcutaneous fat of the lower left abdominal wall is presumably from injection site.  IMPRESSION: No definite findings to account for the reported history of clinical sepsis.  There is some fullness of the right intrarenal collecting system with right periureteric edema/inflammation and apparent fluid or inflammation in the right retroperitoneal tissues tracking down to the right pelvic sidewall.  No evidence for right psoas enlargement suggests the presence of  an underlying psoas abscess although the evaluation is limited by the lack of intravenous contrast material. Recent right urinary stone passage or right pyelonephritis would be a consideration.  Sclerotic lesion in the left proximal femurs incompletely visualized.  This has relatively well defined margins were visualized and is probably benign.  Left hip films may prove helpful to further evaluate.  Original Report Authenticated By: ERIC A. MANSELL, M.D.   Mr Brain Wo Contrast  02/07/2012  *RADIOLOGY REPORT*  Clinical Data: CVA.  Mental status change  MRI HEAD WITHOUT CONTRAST  Technique:  Multiplanar, multiecho pulse sequences of the brain and surrounding structures were obtained according to standard protocol without intravenous contrast.  Comparison: CT head 02/06/2012  Findings: Negative for acute infarct.  Generalized atrophy.  No significant chronic ischemic changes are present.  Negative for hemorrhage or mass lesion.   Mild mastoid sinus effusion on the left.  Mild mucosal edema paranasal sinuses.  No air-fluid levels.  IMPRESSION: Negative for acute infarct.  Generalized atrophy without acute intracranial abnormality.  Original Report Authenticated By: Camelia Phenes, M.D.   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  Altered level of consciousness.  Question infection.  MRI OF THE RIGHT THIGH WITHOUT CONTRAST  Technique:  Multiplanar, multisequence MR imaging of the right thigh was performed.  No intravenous contrast was administered.  Comparison:  Limited comparison is made with pelvic CT 02/08/2012.  Findings:  Study was performed using the body coil.  Both thighs are included on the axial and coronal images.  The patient is status post distal femoral diaphyseal amputation.  The amputation site appears sharp.  Low-level marrow edema within the distal femoral marrow is nonspecific. No cortical destruction is seen.  As  partially imaged on preceding pelvic CT, there is endosteal sclerosis in the left femoral neck extending into the subtrochanteric region.  There is no associated periosteal reaction, marrow edema or focal surrounding soft tissue abnormality.  This is likely a longstanding process.  There is diffuse muscular atrophy in both thighs, especially within the right hamstring musculature.  There is nonspecific multi focal muscular edema, most notably within the right vastus medialis muscle.  Edema is present throughout the left thigh musculature as well.  There is subcutaneous edema in both thighs, greater on the left.  No focal fluid collections are identified.  The visualized bony pelvis demonstrates no significant findings. There is a moderate amount of free pelvic fluid.  Foley catheter is in place.  IMPRESSION:  1.  No specific findings of osteomyelitis status post right above- the-knee amputation.  The amputation site has an expected appearance. 2.  Muscular and subcutaneous edema in both thighs, nonspecific. Myofasciitis not excluded.  There is no focal soft tissue abscess. 3.  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  Original Report Authenticated By: Gerrianne Scale,  M.D.   Mr Foot Right W Wo Contrast  02/02/2012  *RADIOLOGY REPORT*  Clinical Data: Heel wound with possible osteomyelitis shown on radiography.  MRI OF THE RIGHT FOREFOOT WITHOUT AND WITH CONTRAST  Technique:  Multiplanar, multisequence MR imaging was performed both before and after administration of intravenous contrast.  Contrast: 15mL MULTIHANCE GADOBENATE DIMEGLUMINE 529 MG/ML IV SOLN  Comparison: 02/02/2012  Findings: Abnormal osseous edema and enhancement noted posteriorly in the calcaneus adjacent to the Achilles insertion site, with adjacent subcutaneous edema.  The appearance is compatible with calcaneal osteomyelitis and adjacent cellulitis.  No abscess is observed.  There is extensive abnormal subcutaneous  edema laterally and along the dorsum of the foot, which not entirely included on today's ankle examination.  No findings of osteomyelitis involving the distal tibia, distal fibula, talus, midfoot, or metatarsal bases.  Low-level edema and enhancement noted tracking deep to the plantar fascia, without drainable abscess observed.  Nonstandard angulation was utilized to best evaluate the calcaneus, and ligamentous assessment of the ankle is problematic, although no obvious lateral ligamentous complex or deltoid ligament discontinuity is observed, and the flexor tendons of the ankle appear grossly intact. Spring ligament appears intact.  IMPRESSION:  1.  Posterior calcaneal osteomyelitis with adjacent cellulitis. 2.  Suspected low-level fasciitis tracking along the margins of the plantar fascia. 3.  Dorsal subcutaneous edema in the foot probably represents cellulitis given the low-level associated enhancement. 4.  No drainable abscess observed.  Original Report Authenticated By: Dellia Cloud, M.D.   Dg Chest Port 1 View  02/08/2012  *RADIOLOGY REPORT*  Clinical Data: Ventilator.  PORTABLE CHEST - 1 VIEW  Comparison: 02/07/2012  Findings: Worsening aeration of the lungs with increasing vascular congestion and bilateral perihilar/lower lobe opacities, likely edema and/or atelectasis.  Low lung volumes.  Mild cardiomegaly.  IMPRESSION: Worsening aeration with increasing edema/atelectasis.  Original Report Authenticated By: Cyndie Chime, M.D.   Dg Chest Port 1 View  02/07/2012  *RADIOLOGY REPORT*  Clinical Data: Retracted endotracheal tube  PORTABLE CHEST - 1 VIEW  Comparison: 02/07/2012  Findings: Endotracheal tube has been pulled back from the right main bronchus and is now 5 mm above the carina.  It could be withdrawn and additional 2 cm.  Feeding tube is in the stomach.  Improved aeration with decrease in bilateral edema.  Bibasilar atelectasis remains.  Central venous catheter tip remains in the SVC.   IMPRESSION: Endotracheal tube has been withdrawn and is now 5 mm above the carina.  Improvement in bilateral edema.  Original Report Authenticated By: Camelia Phenes, M.D.   Portable Chest Xray In Am  02/07/2012  *RADIOLOGY REPORT*  Clinical Data: Endotracheal tube position  PORTABLE CHEST - 1 VIEW  Comparison: 02/06/2012  Findings: Endotracheal tube remains in the proximal right main bronchus, unchanged.  Recommend withdrawal of 4 cm.  The feeding tube is in place with the tip in the body of the stomach.  Right jugular catheter tip in the SVC.  No pneumothorax.  Interval development of bilateral airspace disease suggestive of pulmonary edema.  Increase in bibasilar atelectasis and small pleural effusion.  IMPRESSION: Endotracheal tube remains in the right main bronchus, unchanged. Recommend withdrawal 4 cm.  Increase in bilateral airspace disease, likely pulmonary edema.  Increase in bibasilar atelectasis and bilateral pleural effusions.  Original Report Authenticated By: Camelia Phenes, M.D.   Portable Chest Xray  02/06/2012  *RADIOLOGY REPORT*  Clinical Data: Endotracheal tube placement.  PORTABLE CHEST - 1 VIEW  Comparison: 02/05/2012  Findings: Endotracheal tube is noted with tip directed to the towards the right mainstem bronchus - recommend 2-3 cm retraction. A right IJ central venous catheters present with tip overlying the upper SVC. Pulmonary vascular congestion and mild left basilar atelectasis again noted. There is no evidence of pneumothorax.  IMPRESSION: Endotracheal tube directed towards the right mainstem bronchus - recommend 2-3 cm retraction.  No other significant changes identified.  These results were called to Maralyn Sago, R.N. on 02/05/2012 at 6:23 p.m.  Original Report Authenticated By: Rosendo Gros, M.D.   Dg Chest Port 1 View  02/05/2012  *RADIOLOGY REPORT*  Clinical Data: Follow-up respiratory failure  PORTABLE CHEST - 1 VIEW  Comparison: Chest radiograph 02/04/2012  Findings: Stable  cardiac silhouette.  Right central venous line is unchanged.  Improvement in bibasilar atelectasis compared to prior. Similar central venous congestion.  No pneumothorax.  IMPRESSION: Improvement in basilar atelectasis.  Original Report Authenticated By: Genevive Bi, M.D.   Dg Chest Port 1 View  02/04/2012  *RADIOLOGY REPORT*  Clinical Data: Respiratory failure, shortness of breath  PORTABLE CHEST - 1 VIEW  Comparison: 02/03/2012  Findings: Cardiomegaly again noted.  Stable right IJ central line position.  Persistent mild congestion/edema.  Probable small left pleural effusion.  Bilateral basilar atelectasis or infiltrate.  IMPRESSION:  Persistent mild congestion/edema.  Probable small left pleural effusion.  Bilateral basilar atelectasis or infiltrate.  Original Report Authenticated By: Natasha Mead, M.D.   Dg Chest Portable 1 View  02/03/2012  *RADIOLOGY REPORT*  Clinical Data: Central line placement.  PORTABLE CHEST - 1 VIEW  Comparison: 11/18/2008.  Findings: Right IJ central line tip projects over the SVC.  No pneumothorax.  Trachea is midline.  Heart is enlarged, stable. There is mild diffuse bilateral air space disease.  No definite pleural effusions.  IMPRESSION:  1.  Right IJ central line placement without pneumothorax. 2.  Pulmonary edema.  Original Report Authenticated By: Reyes Ivan, M.D.   Dg Abd Portable 1v  02/06/2012  *RADIOLOGY REPORT*  Clinical Data: Panda tube placement  PORTABLE ABDOMEN - 1 VIEW  Comparison: None.  Findings: Feeding tube is in the proximal stomach.  Mild ileus.  Gas is present in the stomach and colon.  Left lower lobe airspace disease  IMPRESSION: Feeding tube is in the proximal stomach.  Original Report Authenticated By: Camelia Phenes, M.D.   Dg Foot Complete Right  02/02/2012  *RADIOLOGY REPORT*  Clinical Data: Wound at heel question osteomyelitis, history diabetes, fell 2 weeks ago  RIGHT FOOT COMPLETE - 3+ VIEW  Comparison: None.  Findings: Diffuse soft  tissue swelling. Osseous demineralization. Joint spaces preserved. Soft tissue irregularity and dressing artifacts dorsal to the calcaneus and Achilles insertion. Suspicious area of potential bone destruction is seen at the posterior margin of the calcaneus suspicious for osteomyelitis. Small plantar calcaneal spur. No additional fracture, dislocation or bone destruction seen.  IMPRESSION: Significant soft tissue swelling diffusely in right foot with osseous demineralization. Suspected area of bone destruction at the posterior margin of the calcaneus deep to the known wound suspicious for osteomyelitis. If further imaging is required recommend MR imaging with and without contrast.  Original Report Authenticated By: Lollie Marrow, M.D.   Anti-infectives: Anti-infectives     Start     Dose/Rate Route Frequency Ordered Stop   02/08/12 1200   imipenem-cilastatin (PRIMAXIN) 250 mg in sodium chloride 0.9 % 100 mL IVPB        250 mg 200 mL/hr over 30 Minutes Intravenous  4 times per day 02/08/12 1046     02/07/12 1500   piperacillin-tazobactam (ZOSYN) IVPB 2.25 g  Status:  Discontinued        2.25 g 100 mL/hr over 30 Minutes Intravenous 3 times per day 02/07/12 1458 02/08/12 1046   02/07/12 0800   vancomycin (VANCOCIN) IVPB 1000 mg/200 mL premix        1,000 mg 200 mL/hr over 60 Minutes Intravenous Every 24 hours 02/07/12 1459     02/03/12 2000   vancomycin (VANCOCIN) IVPB 1000 mg/200 mL premix  Status:  Discontinued        1,000 mg 200 mL/hr over 60 Minutes Intravenous Every 12 hours 02/03/12 0919 02/07/12 1459   02/03/12 1730   piperacillin-tazobactam (ZOSYN) IVPB 3.375 g  Status:  Discontinued        3.375 g 100 mL/hr over 30 Minutes Intravenous  Once 02/03/12 1723 02/03/12 1725   02/03/12 1730   vancomycin (VANCOCIN) IVPB 1000 mg/200 mL premix  Status:  Discontinued        1,000 mg 200 mL/hr over 60 Minutes Intravenous  Once 02/03/12 1723 02/03/12 1725   02/03/12 0100    piperacillin-tazobactam (ZOSYN) IVPB 3.375 g  Status:  Discontinued        3.375 g 12.5 mL/hr over 240 Minutes Intravenous Every 8 hours 02/02/12 2233 02/07/12 1458   02/02/12 1700   vancomycin (VANCOCIN) 1,250 mg in sodium chloride 0.9 % 250 mL IVPB  Status:  Discontinued        1,250 mg 166.7 mL/hr over 90 Minutes Intravenous Every 12 hours 02/02/12 1613 02/03/12 0919   02/02/12 1545   piperacillin-tazobactam (ZOSYN) 3.375 g in dextrose 5 % 50 mL IVPB        3.375 g 100 mL/hr over 30 Minutes Intravenous  Once 02/02/12 1530 02/02/12 1937          Assessment/Plan: s/p Procedure(s) (LRB): AMPUTATION BELOW KNEE (Right) No concerning vasc issues.  Left foot stable.  WBC 23,000   LOS: 7 days   Rhyann Berton 02/09/2012, 7:24 AM

## 2012-02-09 NOTE — Progress Notes (Signed)
eLink Physician-Brief Progress Note Patient Name: TYYNE CLIETT DOB: September 06, 1946 MRN: [id number]  Date of Service  02/09/2012   HPI/Events of Note     eICU Interventions  Hypokalemia -repleted    Intervention Category Minor Interventions: Electrolytes abnormality - evaluation and management  Monique Gift V. 02/09/2012, 7:00 PM

## 2012-02-09 NOTE — Progress Notes (Signed)
Family Medicine Teaching Service Attending Note  I discussed patient Alexandra Wiley  with Dr. Losq and reviewed their note for today.  I agree with their assessment and plan.      

## 2012-02-09 NOTE — Progress Notes (Signed)
Nutrition Follow-up / Consult--TF initiation and management  Patient remains intubated.  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  Received consult for TF initiation and management.    Diet Order:  Osmolite 1.2 with goal rate of 40 ml/h will provide 1152 kcals, 53 grams protein, 787 ml free water daily.  Meds: Scheduled Meds:   . amLODipine  5 mg Oral Daily  . antiseptic oral rinse  15 mL Mouth Rinse BID  . atorvastatin  10 mg Oral Daily  . baclofen  10 mg Oral BID  . chlorhexidine  15 mL Mouth/Throat BID  . feeding supplement (OSMOLITE 1.2 CAL)  1,000 mL Per Tube Q24H  . furosemide  40 mg Intravenous Once  . gabapentin  300 mg Oral QID  . heparin  5,000 Units Subcutaneous Q8H  . hydrochlorothiazide  12.5 mg Oral Daily  . hydrocortisone sodium succinate  50 mg Intravenous Q8H  . imipenem-cilastatin  250 mg Intravenous Q6H  . insulin aspart  0-15 Units Subcutaneous Q4H  . insulin aspart protamine-insulin aspart  12 Units Subcutaneous Q supper  . iohexol  20 mL Oral Q1 Hr x 2  . levothyroxine  38 mcg Intravenous Daily  . pantoprazole (PROTONIX) IV  40 mg Intravenous QHS  . potassium chloride  40 mEq Per Tube Once  . sodium chloride  3 mL Intravenous Q12H  . vancomycin  1,000 mg Intravenous Q24H  . DISCONTD: insulin aspart  0-15 Units Subcutaneous TID WC   Continuous Infusions:   . sodium chloride 20 mL/hr at 02/09/12 0625  . propofol Stopped (02/07/12 0600)  . DISCONTD: feeding supplement (OSMOLITE 1.2 CAL)     PRN Meds:.acetaminophen, fentaNYL  Labs:  CMP     Component Value Date/Time   NA 141 02/09/2012 0600   K 3.0* 02/09/2012 0600   CL 104 02/09/2012 0600   CO2 24 02/09/2012 0600   GLUCOSE 150* 02/09/2012 0600   BUN 22 02/09/2012 0600   CREATININE 2.25* 02/09/2012 0600   CALCIUM 8.5 02/09/2012 0600   PROT 5.9* 02/09/2012 0600   ALBUMIN 2.3* 02/09/2012 0600   AST 17 02/09/2012 0600   ALT 16 02/09/2012 0600   ALKPHOS 71 02/09/2012 0600   BILITOT 0.2* 02/09/2012 0600   GFRNONAA 21* 02/09/2012 0600   GFRAA 25* 02/09/2012 0600   CBG (last 3)   Basename 02/09/12 1209 02/09/12 0752 02/09/12 0416  GLUCAP 164* 166* 135*     Intake/Output Summary (Last 24 hours) at 02/09/12 1305 Last data filed at 02/09/12 1000  Gross per 24 hour  Intake 1439.33 ml  Output   4325 ml  Net -2885.67 ml    Weight Status:  79.7 kg (down from 85.5 kg yesterday)  Re-estimated needs, unchanged:  1450 kcals, 90-100 grams protein, 1.6-1.8 liters fluid daily.  Nutrition Dx:  Inadequate oral intake, ongoing.  Goal:  Enteral nutrition to provide 60-70% of estimated calorie needs (22-25 kcals/kg ideal body weight) and 100% of estimated protein needs, based on ASPEN guidelines for permissive underfeeding in critically ill obese individuals, unmet, but progressing.  Intervention:  Change TF to Promote at 25 ml/h, increase by 10 ml every 4 hours to goal rate of 35 ml/h with Prostat 30 ml TID to provide 1140 kcals, 98 grams protein, 705 ml free water daily.  Monitor:  TF tolerance/adequacy, weight trend, labs, I/O   Hettie Holstein Pager #:  [phone]

## 2012-02-10 ENCOUNTER — Inpatient Hospital Stay (HOSPITAL_COMMUNITY): Payer: Medicare Other

## 2012-02-10 DIAGNOSIS — R197 Diarrhea, unspecified: Secondary | ICD-10-CM

## 2012-02-10 DIAGNOSIS — R404 Transient alteration of awareness: Secondary | ICD-10-CM

## 2012-02-10 LAB — GLUCOSE, CAPILLARY
Glucose-Capillary: 226 mg/dL — ABNORMAL HIGH (ref 70–99)
Glucose-Capillary: 215 mg/dL — ABNORMAL HIGH (ref 70–99)
Glucose-Capillary: 223 mg/dL — ABNORMAL HIGH (ref 70–99)

## 2012-02-10 LAB — CULTURE, BLOOD (ROUTINE X 2)
Culture  Setup Time: 201305160136
Culture: NO GROWTH

## 2012-02-10 LAB — COMPREHENSIVE METABOLIC PANEL
AST: 17 U/L (ref 0–37)
Albumin: 2.4 g/dL — ABNORMAL LOW (ref 3.5–5.2)
Alkaline Phosphatase: 90 U/L (ref 39–117)
BUN: 32 mg/dL — ABNORMAL HIGH (ref 6–23)
Potassium: 3.6 mEq/L (ref 3.5–5.1)
Sodium: 140 mEq/L (ref 135–145)
Total Protein: 6 g/dL (ref 6.0–8.3)

## 2012-02-10 LAB — CULTURE, RESPIRATORY W GRAM STAIN

## 2012-02-10 LAB — CBC
HCT: 27.2 % — ABNORMAL LOW (ref 36.0–46.0)
MCH: 26.5 pg (ref 26.0–34.0)
MCV: 81 fL (ref 78.0–100.0)
RDW: 15 % (ref 11.5–15.5)
WBC: 19.7 10*3/uL — ABNORMAL HIGH (ref 4.0–10.5)

## 2012-02-10 LAB — AMMONIA: Ammonia: 48 umol/L (ref 11–60)

## 2012-02-10 MED ORDER — CHLORHEXIDINE GLUCONATE 0.12 % MT SOLN
15.0000 mL | Freq: Two times a day (BID) | OROMUCOSAL | Status: DC
  Administered 2012-02-10 – 2012-02-15 (×11): 15 mL via OROMUCOSAL
  Filled 2012-02-10 (×13): qty 15

## 2012-02-10 MED ORDER — LEVOTHYROXINE SODIUM 100 MCG IV SOLR
25.0000 ug | Freq: Every day | INTRAVENOUS | Status: DC
  Administered 2012-02-10: 26 ug via INTRAVENOUS
  Filled 2012-02-10 (×2): qty 1.3

## 2012-02-10 MED ORDER — HYDRALAZINE HCL 25 MG PO TABS
25.0000 mg | ORAL_TABLET | Freq: Three times a day (TID) | ORAL | Status: DC
  Administered 2012-02-10 – 2012-02-14 (×10): 25 mg via ORAL
  Filled 2012-02-10 (×18): qty 1

## 2012-02-10 MED ORDER — BIOTENE DRY MOUTH MT LIQD
15.0000 mL | Freq: Four times a day (QID) | OROMUCOSAL | Status: DC
  Administered 2012-02-10 – 2012-02-22 (×36): 15 mL via OROMUCOSAL

## 2012-02-10 MED ORDER — SODIUM CHLORIDE 0.9 % IV SOLN
500.0000 mg | Freq: Two times a day (BID) | INTRAVENOUS | Status: DC
  Administered 2012-02-10 – 2012-02-15 (×10): 500 mg via INTRAVENOUS
  Filled 2012-02-10 (×14): qty 0.5

## 2012-02-10 MED ORDER — POTASSIUM CHLORIDE 20 MEQ/15ML (10%) PO LIQD
40.0000 meq | Freq: Once | ORAL | Status: AC
  Administered 2012-02-10: 40 meq
  Filled 2012-02-10: qty 30

## 2012-02-10 MED ORDER — INSULIN GLARGINE 100 UNIT/ML ~~LOC~~ SOLN
5.0000 [IU] | Freq: Every day | SUBCUTANEOUS | Status: DC
  Administered 2012-02-10: 5 [IU] via SUBCUTANEOUS

## 2012-02-10 MED ORDER — PANTOPRAZOLE SODIUM 40 MG PO PACK
40.0000 mg | PACK | Freq: Every day | ORAL | Status: DC
  Administered 2012-02-10 – 2012-02-18 (×8): 40 mg
  Filled 2012-02-10 (×13): qty 20

## 2012-02-10 MED ORDER — POTASSIUM CHLORIDE 20 MEQ/15ML (10%) PO LIQD
ORAL | Status: AC
  Filled 2012-02-10: qty 15

## 2012-02-10 MED ORDER — LACTULOSE 10 GM/15ML PO SOLN
30.0000 g | Freq: Once | ORAL | Status: AC
  Administered 2012-02-10: 30 g via ORAL
  Filled 2012-02-10: qty 45

## 2012-02-10 MED ORDER — HYDRALAZINE HCL 20 MG/ML IJ SOLN
10.0000 mg | INTRAMUSCULAR | Status: DC | PRN
  Filled 2012-02-10: qty 0.5

## 2012-02-10 MED ORDER — VALPROATE SODIUM 500 MG/5ML IV SOLN
500.0000 mg | Freq: Two times a day (BID) | INTRAVENOUS | Status: DC
  Administered 2012-02-10 – 2012-02-11 (×2): 500 mg via INTRAVENOUS
  Filled 2012-02-10 (×4): qty 5

## 2012-02-10 NOTE — Progress Notes (Signed)
Subjective: Patient has improved since initial evaluation but remains well below baseline.  MRI shows no acute changes.  EEG shows increasing triphasic waves with background slowing.  Although encephalopathy remains high on the differential, subclinical seizure activity is a consideration as well, especially since she seems to have reached a plateau.   Objective: Current vital signs: BP 145/83  Pulse 107  Temp(Src) 98.6 F (37 C) (Oral)  Resp 16  Ht 4\' 11"  (1.499 m)  Wt 76.5 kg (168 lb 10.4 oz)  BMI 34.06 kg/m2  SpO2 99% Vital signs in last 24 hours: Temp:  [98.6 F (37 C)-99.2 F (37.3 C)] 98.6 F (37 C) (05/22 1157) Pulse Rate:  [57-130] 107  (05/22 1600) Resp:  [15-33] 16  (05/22 1600) BP: (92-197)/(42-87) 145/83 mmHg (05/22 1600) SpO2:  [99 %-100 %] 99 % (05/22 1600) FiO2 (%):  [29.8 %-40.3 %] 30.3 % (05/22 1600) Weight:  [76.5 kg (168 lb 10.4 oz)] 76.5 kg (168 lb 10.4 oz) (05/22 0442)  Intake/Output from previous day: 05/21 0701 - 05/22 0700 In: 1584 [I.V.:300; NG/GT:670; IV Piggyback:614] Out: 4730 [Urine:4730] Intake/Output this shift: Total I/O In: 835 [I.V.:70; NG/GT:465; IV Piggyback:300] Out: 1005 [Urine:1005] Nutritional status: NPO  Neurologic Exam: Mental Status:  Patient alerts when name called and visually localizes.  Follows simple commands such as squeezing my hand bilaterally and sticking out her tongue.  Otherwise patient with eyes half open.  No spontaneous attempts at communication.   Motor:  Moves all extremities weakly.    Lab Results: Results for orders placed during the hospital encounter of 02/02/12 (from the past 48 hour(s))  GLUCOSE, CAPILLARY     Status: Abnormal   Collection Time   02/08/12  8:18 PM      Component Value Range Comment   Glucose-Capillary 148 (*) 70 - 99 (mg/dL)   GLUCOSE, CAPILLARY     Status: Abnormal   Collection Time   02/09/12 12:35 AM      Component Value Range Comment   Glucose-Capillary 155 (*) 70 - 99 (mg/dL)    Comment 1 Documented in Chart      Comment 2 Notify RN     POCT I-STAT 3, BLOOD GAS (G3+)     Status: Abnormal   Collection Time   02/09/12  3:29 AM      Component Value Range Comment   pH, Arterial 7.377  7.350 - 7.400     pCO2 arterial 39.5  35.0 - 45.0 (mmHg)    pO2, Arterial 115.0 (*) 80.0 - 100.0 (mmHg)    Bicarbonate 23.2  20.0 - 24.0 (mEq/L)    TCO2 24  0 - 100 (mmol/L)    O2 Saturation 98.0      Acid-base deficit 2.0  0.0 - 2.0 (mmol/L)    Patient temperature 98.6 F      Collection site RADIAL, ALLEN'S TEST ACCEPTABLE      Drawn by Operator      Sample type ARTERIAL     GLUCOSE, CAPILLARY     Status: Abnormal   Collection Time   02/09/12  4:16 AM      Component Value Range Comment   Glucose-Capillary 135 (*) 70 - 99 (mg/dL)   COMPREHENSIVE METABOLIC PANEL     Status: Abnormal   Collection Time   02/09/12  6:00 AM      Component Value Range Comment   Sodium 141  135 - 145 (mEq/L)    Potassium 3.0 (*) 3.5 - 5.1 (mEq/L)  Chloride 104  96 - 112 (mEq/L)    CO2 24  19 - 32 (mEq/L)    Glucose, Bld 150 (*) 70 - 99 (mg/dL)    BUN 22  6 - 23 (mg/dL)    Creatinine, Ser 1.61 (*) 0.50 - 1.10 (mg/dL)    Calcium 8.5  8.4 - 10.5 (mg/dL)    Total Protein 5.9 (*) 6.0 - 8.3 (g/dL)    Albumin 2.3 (*) 3.5 - 5.2 (g/dL)    AST 17  0 - 37 (U/L)    ALT 16  0 - 35 (U/L)    Alkaline Phosphatase 71  39 - 117 (U/L)    Total Bilirubin 0.2 (*) 0.3 - 1.2 (mg/dL)    GFR calc non Af Amer 21 (*) >90 (mL/min)    GFR calc Af Amer 25 (*) >90 (mL/min)   CBC     Status: Abnormal   Collection Time   02/09/12  6:00 AM      Component Value Range Comment   WBC 22.9 (*) 4.0 - 10.5 (K/uL)    RBC 3.18 (*) 3.87 - 5.11 (MIL/uL)    Hemoglobin 8.5 (*) 12.0 - 15.0 (g/dL)    HCT 09.6 (*) 04.5 - 46.0 (%)    MCV 81.4  78.0 - 100.0 (fL)    MCH 26.7  26.0 - 34.0 (pg)    MCHC 32.8  30.0 - 36.0 (g/dL)    RDW 40.9  81.1 - 91.4 (%)    Platelets 427 (*) 150 - 400 (K/uL)   DIFFERENTIAL     Status: Abnormal    Collection Time   02/09/12  6:00 AM      Component Value Range Comment   Neutrophils Relative 84 (*) 43 - 77 (%)    Neutro Abs 19.1 (*) 1.7 - 7.7 (K/uL)    Lymphocytes Relative 7 (*) 12 - 46 (%)    Lymphs Abs 1.6  0.7 - 4.0 (K/uL)    Monocytes Relative 9  3 - 12 (%)    Monocytes Absolute 2.1 (*) 0.1 - 1.0 (K/uL)    Eosinophils Relative 0  0 - 5 (%)    Eosinophils Absolute 0.0  0.0 - 0.7 (K/uL)    Basophils Relative 0  0 - 1 (%)    Basophils Absolute 0.0  0.0 - 0.1 (K/uL)   PRO B NATRIURETIC PEPTIDE     Status: Abnormal   Collection Time   02/09/12  6:00 AM      Component Value Range Comment   Pro B Natriuretic peptide (BNP) 9763.0 (*) 0 - 125 (pg/mL)   GLUCOSE, CAPILLARY     Status: Abnormal   Collection Time   02/09/12  7:52 AM      Component Value Range Comment   Glucose-Capillary 166 (*) 70 - 99 (mg/dL)   GLUCOSE, CAPILLARY     Status: Abnormal   Collection Time   02/09/12 12:09 PM      Component Value Range Comment   Glucose-Capillary 164 (*) 70 - 99 (mg/dL)   URINALYSIS, ROUTINE W REFLEX MICROSCOPIC     Status: Normal   Collection Time   02/09/12 12:36 PM      Component Value Range Comment   Color, Urine YELLOW  YELLOW     APPearance CLEAR  CLEAR     Specific Gravity, Urine 1.006  1.005 - 1.030     pH 7.0  5.0 - 8.0     Glucose, UA NEGATIVE  NEGATIVE (mg/dL)  Hgb urine dipstick NEGATIVE  NEGATIVE     Bilirubin Urine NEGATIVE  NEGATIVE     Ketones, ur NEGATIVE  NEGATIVE (mg/dL)    Protein, ur NEGATIVE  NEGATIVE (mg/dL)    Urobilinogen, UA 0.2  0.0 - 1.0 (mg/dL)    Nitrite NEGATIVE  NEGATIVE     Leukocytes, UA NEGATIVE  NEGATIVE  MICROSCOPIC NOT DONE ON URINES WITH NEGATIVE PROTEIN, BLOOD, LEUKOCYTES, NITRITE, OR GLUCOSE <1000 mg/dL.  CBC     Status: Abnormal   Collection Time   02/09/12 12:38 PM      Component Value Range Comment   WBC 22.1 (*) 4.0 - 10.5 (K/uL)    RBC 3.21 (*) 3.87 - 5.11 (MIL/uL)    Hemoglobin 8.7 (*) 12.0 - 15.0 (g/dL)    HCT 82.9 (*) 56.2 - 46.0  (%)    MCV 81.0  78.0 - 100.0 (fL)    MCH 27.1  26.0 - 34.0 (pg)    MCHC 33.5  30.0 - 36.0 (g/dL)    RDW 13.0  86.5 - 78.4 (%)    Platelets 431 (*) 150 - 400 (K/uL)   DIFFERENTIAL     Status: Abnormal   Collection Time   02/09/12 12:38 PM      Component Value Range Comment   Neutrophils Relative 83 (*) 43 - 77 (%)    Neutro Abs 18.4 (*) 1.7 - 7.7 (K/uL)    Lymphocytes Relative 8 (*) 12 - 46 (%)    Lymphs Abs 1.8  0.7 - 4.0 (K/uL)    Monocytes Relative 9  3 - 12 (%)    Monocytes Absolute 1.9 (*) 0.1 - 1.0 (K/uL)    Eosinophils Relative 0  0 - 5 (%)    Eosinophils Absolute 0.0  0.0 - 0.7 (K/uL)    Basophils Relative 0  0 - 1 (%)    Basophils Absolute 0.0  0.0 - 0.1 (K/uL)   AMMONIA     Status: Normal   Collection Time   02/09/12 12:39 PM      Component Value Range Comment   Ammonia 23  11 - 60 (umol/L)   GLUCOSE, CAPILLARY     Status: Abnormal   Collection Time   02/09/12  4:17 PM      Component Value Range Comment   Glucose-Capillary 147 (*) 70 - 99 (mg/dL)   BASIC METABOLIC PANEL     Status: Abnormal   Collection Time   02/09/12  5:52 PM      Component Value Range Comment   Sodium 142  135 - 145 (mEq/L)    Potassium 3.2 (*) 3.5 - 5.1 (mEq/L)    Chloride 104  96 - 112 (mEq/L)    CO2 28  19 - 32 (mEq/L)    Glucose, Bld 164 (*) 70 - 99 (mg/dL)    BUN 24 (*) 6 - 23 (mg/dL)    Creatinine, Ser 6.96 (*) 0.50 - 1.10 (mg/dL)    Calcium 8.8  8.4 - 10.5 (mg/dL)    GFR calc non Af Amer 23 (*) >90 (mL/min)    GFR calc Af Amer 27 (*) >90 (mL/min)   GLUCOSE, CAPILLARY     Status: Abnormal   Collection Time   02/09/12  8:05 PM      Component Value Range Comment   Glucose-Capillary 173 (*) 70 - 99 (mg/dL)   GLUCOSE, CAPILLARY     Status: Abnormal   Collection Time   02/10/12 12:04 AM      Component  Value Range Comment   Glucose-Capillary 183 (*) 70 - 99 (mg/dL)    Comment 1 Notify RN     COMPREHENSIVE METABOLIC PANEL     Status: Abnormal   Collection Time   02/10/12  3:55 AM       Component Value Range Comment   Sodium 140  135 - 145 (mEq/L)    Potassium 3.6  3.5 - 5.1 (mEq/L)    Chloride 103  96 - 112 (mEq/L)    CO2 28  19 - 32 (mEq/L)    Glucose, Bld 234 (*) 70 - 99 (mg/dL)    BUN 32 (*) 6 - 23 (mg/dL)    Creatinine, Ser 2.44 (*) 0.50 - 1.10 (mg/dL)    Calcium 8.8  8.4 - 10.5 (mg/dL)    Total Protein 6.0  6.0 - 8.3 (g/dL)    Albumin 2.4 (*) 3.5 - 5.2 (g/dL)    AST 17  0 - 37 (U/L)    ALT 13  0 - 35 (U/L)    Alkaline Phosphatase 90  39 - 117 (U/L)    Total Bilirubin 0.2 (*) 0.3 - 1.2 (mg/dL)    GFR calc non Af Amer 22 (*) >90 (mL/min)    GFR calc Af Amer 26 (*) >90 (mL/min)   CBC     Status: Abnormal   Collection Time   02/10/12  3:55 AM      Component Value Range Comment   WBC 19.7 (*) 4.0 - 10.5 (K/uL)    RBC 3.36 (*) 3.87 - 5.11 (MIL/uL)    Hemoglobin 8.9 (*) 12.0 - 15.0 (g/dL)    HCT 01.0 (*) 27.2 - 46.0 (%)    MCV 81.0  78.0 - 100.0 (fL)    MCH 26.5  26.0 - 34.0 (pg)    MCHC 32.7  30.0 - 36.0 (g/dL)    RDW 53.6  64.4 - 03.4 (%)    Platelets 427 (*) 150 - 400 (K/uL)   GLUCOSE, CAPILLARY     Status: Abnormal   Collection Time   02/10/12  4:24 AM      Component Value Range Comment   Glucose-Capillary 226 (*) 70 - 99 (mg/dL)    Comment 1 Notify RN     GLUCOSE, CAPILLARY     Status: Abnormal   Collection Time   02/10/12  7:38 AM      Component Value Range Comment   Glucose-Capillary 215 (*) 70 - 99 (mg/dL)   GLUCOSE, CAPILLARY     Status: Abnormal   Collection Time   02/10/12 11:05 AM      Component Value Range Comment   Glucose-Capillary 229 (*) 70 - 99 (mg/dL)   AMMONIA     Status: Normal   Collection Time   02/10/12 11:14 AM      Component Value Range Comment   Ammonia 48  11 - 60 (umol/L)   GLUCOSE, CAPILLARY     Status: Abnormal   Collection Time   02/10/12  3:14 PM      Component Value Range Comment   Glucose-Capillary 200 (*) 70 - 99 (mg/dL)     Recent Results (from the past 240 hour(s))  MRSA PCR SCREENING     Status: Normal    Collection Time   02/03/12  6:10 PM      Component Value Range Status Comment   MRSA by PCR NEGATIVE  NEGATIVE  Final   CULTURE, BLOOD (ROUTINE X 2)     Status: Normal   Collection  Time   02/03/12  6:15 PM      Component Value Range Status Comment   Specimen Description BLOOD CENTRAL LINE   Final    Special Requests     Final    Value: BOTTLES DRAWN AEROBIC AND ANAEROBIC 10CC LEFT IJ CVC   Culture  Setup Time [id number]   Final    Culture NO GROWTH 5 DAYS   Final    Report Status 02/10/2012 FINAL   Final   URINE CULTURE     Status: Normal   Collection Time   02/03/12  6:20 PM      Component Value Range Status Comment   Specimen Description URINE, CATHETERIZED   Final    Special Requests NONE   Final    Culture  Setup Time [id number]   Final    Colony Count NO GROWTH   Final    Culture NO GROWTH   Final    Report Status 02/05/2012 FINAL   Final   CULTURE, BLOOD (ROUTINE X 2)     Status: Normal   Collection Time   02/03/12  7:58 PM      Component Value Range Status Comment   Specimen Description BLOOD HAND RIGHT   Final    Special Requests BOTTLES DRAWN AEROBIC ONLY 3CC   Final    Culture  Setup Time [id number]   Final    Culture NO GROWTH 5 DAYS   Final    Report Status 02/10/2012 FINAL   Final   CULTURE, BLOOD (ROUTINE X 2)     Status: Normal (Preliminary result)   Collection Time   02/07/12 11:42 AM      Component Value Range Status Comment   Specimen Description BLOOD RIGHT HAND   Final    Special Requests BOTTLES DRAWN AEROBIC ONLY 3CC   Final    Culture  Setup Time [id number]   Final    Culture     Final    Value:        BLOOD CULTURE RECEIVED NO GROWTH TO DATE CULTURE WILL BE HELD FOR 5 DAYS BEFORE ISSUING A FINAL NEGATIVE REPORT   Report Status PENDING   Incomplete   CULTURE, BLOOD (ROUTINE X 2)     Status: Normal (Preliminary result)   Collection Time   02/07/12  1:37 PM      Component Value Range Status Comment   Specimen Description BLOOD LEFT HAND   Final      Special Requests BOTTLES DRAWN AEROBIC AND ANAEROBIC 3CC   Final    Culture  Setup Time [id number]   Final    Culture     Final    Value:        BLOOD CULTURE RECEIVED NO GROWTH TO DATE CULTURE WILL BE HELD FOR 5 DAYS BEFORE ISSUING A FINAL NEGATIVE REPORT   Report Status PENDING   Incomplete   CULTURE, RESPIRATORY     Status: Normal   Collection Time   02/07/12  2:22 PM      Component Value Range Status Comment   Specimen Description TRACHEAL ASPIRATE   Final    Special Requests NONE   Final    Gram Stain     Final    Value: FEW WBC PRESENT, PREDOMINANTLY PMN     NO SQUAMOUS EPITHELIAL CELLS SEEN     NO ORGANISMS SEEN   Culture Non-Pathogenic Oropharyngeal-type Flora Isolated.   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  Altered level of consciousness.  Question infection.  MRI OF THE RIGHT THIGH WITHOUT CONTRAST  Technique:  Multiplanar, multisequence MR imaging of the right thigh was performed.  No intravenous contrast was administered.  Comparison:  Limited comparison is made with pelvic CT 02/08/2012.  Findings:  Study was performed using the body coil.  Both thighs are included on the axial and coronal images.  The patient is status post distal femoral diaphyseal amputation.  The amputation site appears sharp.  Low-level marrow edema within the distal femoral marrow is nonspecific. No cortical destruction is seen.  As partially imaged on preceding pelvic CT, there is endosteal sclerosis in the left femoral neck extending into the subtrochanteric region.  There is no associated periosteal reaction, marrow edema or focal surrounding soft tissue abnormality.  This is likely a longstanding process.  There is diffuse muscular atrophy in both  thighs, especially within the right hamstring musculature.  There is nonspecific multi focal muscular edema, most notably within the right vastus medialis muscle.  Edema is present throughout the left thigh musculature as well.  There is subcutaneous edema in both thighs, greater on the left.  No focal fluid collections are identified.  The visualized bony pelvis demonstrates no significant findings. There is a moderate amount of free pelvic fluid.  Foley catheter is in place.  IMPRESSION:  1.  No specific findings of osteomyelitis status post right above- the-knee amputation.  The amputation site has an expected appearance. 2.  Muscular and subcutaneous edema in both thighs, nonspecific. Myofasciitis not excluded.  There is no focal soft tissue abscess. 3.  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  Original Report Authenticated By: Gerrianne Scale, M.D.   Mr Mra Ext Low Left W/o Cm  02/09/2012  *RADIOLOGY REPORT*  Clinical Data:  Sepsis and status post right above knee amputation on the 02/05/2012 for gangrene of the right lower extremity.  MRA OF THE RIGHT AND LEFT LOWER EXTREMITIES WITHOUT CONTRAST  Technique:  Multiplanar multisequence MR imaging of the extremity was performed.  MRA sequences were obtained utilizing 2-D time-of- flight technique.  No intravenous contrast was administered due to renal failure.  Comparison:   None.  Findings:  The right lower extremity demonstrates patent flow via external and common femoral arteries.  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  Profunda femoral artery and its branches are open into the thigh and to the level of the above knee amputation.  The left lower extremity demonstrates patent external iliac and common femoral arteries.  SFA and profunda femoral arteries are open and show no obstructing lesions.  The popliteal artery is open.  Within the calf, patent flow is demonstrated initially via anterior  tibial, posterior tibial and peroneal arteries.  These are able to be followed to the distal calf level.  The vessels are poorly defined at the level of the ankle and foot, likely due to slow flow and limitations of 2-D time-of-flight imaging.  IMPRESSION:  1.  Right lower extremity demonstrates SFA occlusion.  Profunda femoral artery branches are open to the level of above knee amputation. 2.  Left lower extremity demonstrates no significant proximal inflow disease.  Patent flow is demonstrated in the calf via tibial arteries.  Definition of tibial arteries  at the level of the ankle and foot is limited, likely reflecting slow flow.  Original Report Authenticated By: Reola Calkins, M.D.   Mr Maxine Glenn Ext Low Right W/o Cm  02/09/2012  *RADIOLOGY REPORT*  Clinical Data:  Sepsis and status post right above knee amputation on the 02/05/2012 for gangrene of the right lower extremity.  MRA OF THE RIGHT AND LEFT LOWER EXTREMITIES WITHOUT CONTRAST  Technique:  Multiplanar multisequence MR imaging of the extremity was performed.  MRA sequences were obtained utilizing 2-D time-of- flight technique.  No intravenous contrast was administered due to renal failure.  Comparison:   None.  Findings:  The right lower extremity demonstrates patent flow via external and common femoral arteries.  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  Profunda femoral artery and its branches are open into the thigh and to the level of the above knee amputation.  The left lower extremity demonstrates patent external iliac and common femoral arteries.  SFA and profunda femoral arteries are open and show no obstructing lesions.  The popliteal artery is open.  Within the calf, patent flow is demonstrated initially via anterior tibial, posterior tibial and peroneal arteries.  These are able to be followed to the distal calf level.  The vessels are poorly defined at the level of the ankle and foot, likely due to slow  flow and limitations of 2-D time-of-flight imaging.  IMPRESSION:  1.  Right lower extremity demonstrates SFA occlusion.  Profunda femoral artery branches are open to the level of above knee amputation. 2.  Left lower extremity demonstrates no significant proximal inflow disease.  Patent flow is demonstrated in the calf via tibial arteries.  Definition of tibial arteries at the level of the ankle and foot is limited, likely reflecting slow flow.  Original Report Authenticated By: Reola Calkins, M.D.   US Renal Port  02/09/2012  *RADIOLOGY REPORT*  Clinical Data: Elevated creatinine, evaluate for hydronephrosis  RENAL/URINARY TRACT ULTRASOUND COMPLETE  Comparison:  CT abdomen pelvis dated 02/08/2012  Findings:  Evaluation is mildly constrained due to portable technique.  Right Kidney:  Measures 11.6 cm.  No mass or hydronephrosis.  Left Kidney:  Measures 11.2 cm.  No mass or hydronephrosis.  Bladder:  Nonvisualized secondary to indwelling Foley catheter.  IMPRESSION: No hydronephrosis.  Original Report Authenticated By: Charline Bills, M.D.   Dg Chest Port 1 View  02/10/2012  *RADIOLOGY REPORT*  Clinical Data: Evaluate endotracheal tube placement.  PORTABLE CHEST - 1 VIEW  Comparison: Chest x-ray 02/09/2012.  Findings: Compared to the prior examination there has been interval advancement of the endotracheal tube which is now only 7 mm above the carina. A feeding tube is seen extending into the abdomen, tip within the body of the stomach.  Lung volumes are low.  There are persistent bibasilar opacities which may represent areas of atelectasis and/or consolidation, with superimposed small bilateral pleural effusions.  Overall, aeration has slightly improved.  No evidence of pulmonary edema.  Heart size is mildly enlarged.  The patient is rotated to the right on today's exam, resulting in distortion of the mediastinal contours and reduced diagnostic sensitivity and specificity for mediastinal pathology.  Atherosclerotic calcifications within the arch of the aorta.  IMPRESSION: 1.  Low-lying endotracheal tube.  Consider withdrawal 3-4 cm for more optimal placement. 2.  Slight improvement in aeration, however, there continues to be extensive areas of bibasilar atelectasis and/or consolidation with superimposed small bilateral pleural effusions. 3.  Atherosclerosis.  These results  were called by telephone on 02/10/2012 at 09:50 a.m. to nurse Lauren in the ICU, who verbally acknowledged these results.  Original Report Authenticated By: Florencia Reasons, M.D.   Dg Chest Port 1 View  02/09/2012  *RADIOLOGY REPORT*  Clinical Data: Evaluate endotracheal tube, shortness of breath  PORTABLE CHEST - 1 VIEW  Comparison: 02/08/2012; 02/07/2012; 02/06/2012; 02/05/2012; chest CT of 02/08/2012  Findings: Grossly unchanged enlarged cardiac silhouette and mediastinal contours.  Stable position of support apparatus.  No definite pneumothorax, though the right lung apex excluded secondary to overlying chin.  Small bilateral effusions and perihilar and basilar opacities are grossly unchanged.  Unchanged bones.  IMPRESSION: 1.  Stable positioning of support apparatus.  No pneumothorax. 2.  Unchanged small bilateral effusions and perihilar/bibasilar opacities, atelectasis versus infiltrate.  Original Report Authenticated By: Waynard Reeds, M.D.    Medications:  I have reviewed the patient's current medications. Scheduled:   . amLODipine  5 mg Oral Daily  . antiseptic oral rinse  15 mL Mouth Rinse QID  . atorvastatin  10 mg Oral Daily  . baclofen  10 mg Oral BID  . chlorhexidine  15 mL Mouth Rinse BID  . feeding supplement  30 mL Per Tube TID WC  . feeding supplement (PROMOTE)  1,000 mL Per Tube Q24H  . gabapentin  300 mg Oral QID  . heparin  5,000 Units Subcutaneous Q8H  . hydrALAZINE  25 mg Oral Q8H  . hydrochlorothiazide  12.5 mg Oral Daily  . insulin aspart  0-15 Units Subcutaneous Q4H  . insulin glargine  5  Units Subcutaneous Daily  . lactulose  30 g Oral Once  . levothyroxine  26 mcg Intravenous Daily  . pantoprazole sodium  40 mg Per Tube QHS  . potassium chloride  40 mEq Per Tube Once  . potassium chloride  40 mEq Per Tube Once  . potassium chloride      . sodium chloride  3 mL Intravenous Q12H  . vancomycin  1,000 mg Intravenous Q24H  . DISCONTD: antiseptic oral rinse  15 mL Mouth Rinse BID  . DISCONTD: chlorhexidine  15 mL Mouth/Throat BID  . DISCONTD: hydrocortisone sodium succinate  50 mg Intravenous Q8H  . DISCONTD: imipenem-cilastatin  250 mg Intravenous Q6H  . DISCONTD: insulin aspart protamine-insulin aspart  12 Units Subcutaneous Q supper  . DISCONTD: levothyroxine  38 mcg Intravenous Daily  . DISCONTD: pantoprazole (PROTONIX) IV  40 mg Intravenous QHS    Assessment/Plan:  Patient Active Hospital Problem List:  Altered mental status (02/03/2012)   Assessment: Would consider a trial of an anticonvulsant.  Will repeat EEG once levels therapeutic and follow patient clinically for improvement.  If no improvement seen, less likely a consequence of subclinical seizure disorder and will just need to give the patient more time.     Plan: 1. Depacon 500mg  IV q12 hours with level in the morning.     LOS: 8 days   Thana Farr, MD [facility] [phone] 02/10/2012  4:57 PM

## 2012-02-10 NOTE — Progress Notes (Signed)
Family Medicine Teaching Service Attending Note  I discussed patient Alexandra Wiley  with Dr. Losq and reviewed their note for today.  I agree with their assessment and plan.      

## 2012-02-10 NOTE — Progress Notes (Signed)
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   Episode of asymptomatic hypotension 5/15 prompted transfer to ICU and concern for sepsis.    Lines / Drains: IJ TLC 5/15>>> ETT 5/18>>>  Cultures: 5/15 BCx2>>> neg 5/18 Sputum>>>NF 5/15 UC>>>neg 5/18 BC>>>  Antibiotics: 5/14 Vanco (osteo)>>> 5/14 Zosyn (oseto)>>>5/20 iMIPENEM 5/20>>>  Tests / Events: 5/14 Foot XRAY>>>Significant soft tissue swelling diffusely in right foot with osseous demineralization. Suspected area of bone destruction at the posterior margin of the calcaneus deep to the known wound suspicious for osteomyelitis. If further imaging is required recommend MR imaging with and without contrast. 5/14 R FOOT MRI>>>Posterior calcaneal osteomyelitis with adjacent cellulitis.  Suspected low-level fasciitis tracking along the margins of the plantar fascia. Dorsal subcutaneous edema in the foot probably represents cellulitis given the low-level associated enhancement.  No drainable abscess observed  5/18 CT head: Negative 5/19 MRI brain>>> Negative for acute infarct. Generalized atrophy without acute  intracranial abnormality.  5/19- EEG -diffuse slowing all leads, no focus 5/20- slow improvement neurostatus 5/20 imipenem started 5/20- ct done, MRI done, neg balance, improved crt 5/21 renal US- no hydro 5/21 eeg - mod enceph, cannot r/o focus, may need benzo and repeat eeg 5/22- neg 3.1 liters, improved pcxr  Vital Signs: Temp:  [98.1 F (36.7 C)-99.4 F (37.4 C)] 98.6 F (37 C) (05/22 0748) Pulse Rate:  [57-99] 78  (05/22 0800) Resp:  [13-25] 21  (05/22 0800) BP: (92-182)/(42-87) 172/64  mmHg (05/22 0800) SpO2:  [98 %-100 %] 100 % (05/22 0800) FiO2 (%):  [30 %-40.3 %] 40 % (05/22 0820) Weight:  [76.5 kg (168 lb 10.4 oz)] 76.5 kg (168 lb 10.4 oz) (05/22 0442) I/O last 3 completed shifts: In: 2218.3 [I.V.:504.3; NG/GT:790; IV Piggyback:924] Out: 6855 [Urine:6855]  Physical Examination: General:awakens, follows commands, lethargic intermittent Neuro: nonfocal exam, more alert slow process however CV: bradycardia no longer , s1 s2 2/6 murmur  PULM: CTA GI: round/soft, bsx4 active Extremities: cool/dry, chronic LE venous changes s/p R BKA no chnags, dop pulses  Labs    CBC  Lab 02/10/12 0355 02/09/12 1238 02/09/12 0600  HGB 8.9* 8.7* 8.5*  HCT 27.2* 26.0* 25.9*  WBC 19.7* 22.1* 22.9*  PLT 427* 431* 427*   BMET  Lab 02/10/12 0355 02/09/12 1752 02/09/12 0600 02/08/12 0425 02/07/12 0320 02/04/12 0400 02/03/12 1825  NA 140 142 141 142 140 -- --  K 3.6 3.2* -- -- -- -- --  CL 103 104 104 111 108 -- --  CO2 28 28 24 21 24  -- --  GLUCOSE 234* 164* 150* 131* 189* -- --  BUN 32* 24* 22 17 10  -- --  CREATININE 2.15* 2.11* 2.25* 2.35* 2.19* -- --  CALCIUM 8.8 8.8 8.5 7.9* 7.6* -- --  MG -- -- -- -- -- 1.5 1.6  PHOS -- -- -- -- -- 3.3 3.5    Lab 02/05/12 0520 02/03/12 1825  INR 1.11 1.16     Lab 02/09/12 0329 02/08/12 0455 02/06/12 1021 02/03/12 2130 02/03/12 1910  PHART 7.377 7.311* 7.375 -- --  PCO2ART 39.5 40.2 45.1* -- --  PO2ART 115.0* 124.0* 74.0* -- --  HCO3 23.2 19.7* 26.4* -- --  TCO2 24 20.9 28 -- --  O2SAT 98.0 98.6 94.0 71.5 61.0    Radiology: CXR: bibasilr atx / edema, ett wnl Echo -HOCM, 65% 5/16 MR R Femur 5/20: negative for abscess MR/MRA L Lower ext 5/20: negative CT chest ABD Pelvis 5/20: Patchy left lower lobe airspace disease , negative otherwise  Renal US 5/21- neg hydro  Assessment and Plan: A:Sepsis secondary to osteomyelitis of right foot -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 Plan:  -unimpressed aspiration, also even after review of CT chest -Cont  Vanc and Imipenem - no fevers -renal US neg for hydro correlating with CT overall  A: SIRS, hypovolemia, severe spesis- resolved P: -volume up now, kvo -has benefited lasix, dc  Acute resp failure -CT unimpressive PNA -wean cpap 5 ps 5, assess rsbi, abg at 1 hrs if successful -likley need more improved neurostatus prior to extubation success -dc lasix, resolved edema and rise crt pcxr in am  Chair position  A: Acute renal failure sCr doubled over last 24 hours, unclear if this is due to intermittent hypotension, or sepsis  Fena .67% sugesting pre-renal cause.  Urine Osmo 272 Likely ATN, likely also hypovolemia contribution P: -slight over diuresis Dc lasix bmet in am  kvo Renal US reviewed  A: intermittent Bradycardia  P: -Tele asymptomatic Echo reviewed  A: Acute Encephalopathy secondary to unclear, likely this is multifactorial, sepsis, hypovolemia, narcotics, uremia CT and MRI and EEG #1 neg EEg 3@ reviewed , again likely all enceph, will d/w neuro if they want repeat after benzo? P: -f/u neuro recs -eeg reviewed, wil update family. Her entire clinical presentation appears as enceph -ct neg, MRI neg -treat metabolic / uremia/ toxic encephalopathy Slow improvement, does follow commands -no liver disease history to suggest a hepatic component: could consider an empiric lactulose? Ammonia diagnostic level, although correlation can be in question  tsh essentially wnl, slight reduction downward  A: Leukocytosis P: Wound clean WBC better even with over 4 lit neg hemocontentration affect  A: Hx of HTN / CHF P: -start home norvasc, HCTZ -Still HTN, add prn hydralzazine -add hydralazine oral, no home beta blocker with int brady prior Dc roids with HTN, no taper  A: Anemia- no indication for PRBCs at this time P: -monitor Lasix, watch HCT rise  A: Conus medullaris syndrome Plan:  -Per primary (family medicine), d/w with them this am , updated  A: DM Plan:    -Cont SSI - ACHS Dc steroids Dc 70-30, add lantus  A: Hx of Gout:  P: -monitor symptoms, if pain then steroids increase, not needed  BEST PRACTICE / DISPOSITION  - Level of Care: icu  - Primary Service: pccm  - Consultants: vascular surg, neuro, ID - Code Status: full  - Diet: if not extubated then feed - DVT Px: heparin SQ - GI Px: ppi  - Social / Family:family updated extensive 5/20, 5/21, 5/22  Ccm 30 min   [name]. [name], MD, FACP Pgr: [phone] [facility]

## 2012-02-10 NOTE — Progress Notes (Signed)
Family Medicine Teaching Service Daily Progress Note: [id number] Subjective: S/p right AKA post op day #5. Remains intubated but is able to follow command.    Objective: Vital signs in last 24 hours: Temp:  [98.6 F (37 C)-99.4 F (37.4 C)] 98.6 F (37 C) (05/22 1157) Pulse Rate:  [57-99] 97  (05/22 1100) Resp:  [13-27] 27  (05/22 1100) BP: (92-197)/(42-87) 197/68 mmHg (05/22 1100) SpO2:  [98 %-100 %] 99 % (05/22 1100) FiO2 (%):  [30 %-40.3 %] 30.4 % (05/22 1100) Weight:  [168 lb 10.4 oz (76.5 kg)] 168 lb 10.4 oz (76.5 kg) (05/22 0442) Weight change: -7 lb 0.9 oz (-3.2 kg) Last BM Date: 02/09/12  Intake/Output from previous day: 05/21 0701 - 05/22 0700 In: 1584 [I.V.:300; NG/GT:670; IV Piggyback:614] Out: 4730 [Urine:4730] Intake/Output this shift: Total I/O In: 430 [I.V.:30; NG/GT:200; IV Piggyback:200] Out: 415 [Urine:415] Constitutional: Intubated and able to open eyes when called by name. Follows commands when asked to squeeze fingers, but then repeatedly squeezes.  Cardiovascular: Normal rate and regular rhythm.  3/6 systolic murmur best heard at the right sternal border  Respiratory: Vent sounds.  GI: Soft. She exhibits no distension. There is no tenderness. There is no guarding.  Neurological: sedated Extremities: bandage clean and dry on right stump. Boot in place on left foot.   Lab Results:  Basename 02/10/12 0355 02/09/12 1238  WBC 19.7* 22.1*  HGB 8.9* 8.7*  HCT 27.2* 26.0*  PLT 427* 431*   BMET  Basename 02/10/12 0355 02/09/12 1752  NA 140 142  K 3.6 3.2*  CL 103 104  CO2 28 28  GLUCOSE 234* 164*  BUN 32* 24*  CREATININE 2.15* 2.11*  CALCIUM 8.8 8.8    Studies/Results: Ct Abdomen Pelvis Wo Contrast  02/08/2012  *RADIOLOGY REPORT*  Clinical Data:  Sepsis  CT CHEST, ABDOMEN AND PELVIS WITHOUT CONTRAST  Technique:  Multidetector CT imaging of the chest, abdomen and pelvis was performed following the standard protocol without IV contrast.  Comparison:   The 04/09/2004.  CT CHEST  Findings:  Endotracheal tube tip is seen in the mid trachea.  Right IJ central line tip projects at the mid SVC level.  Feeding tube tip is in the mid stomach.  No axillary lymphadenopathy.  Scattered small lymph nodes are seen in the mediastinum.  No overt hilar lymphadenopathy.  The heart is enlarged.  Coronary artery calcification is evident.  No pericardial effusion.  Probable tiny bilateral pleural effusions.  Evaluation of fine detail in the lungs is obscured by patient breathing motion.  There is patchy airspace disease in the left upper lobe with bibasilar collapse / consolidation.  IMPRESSION: Patchy airspace disease in the left upper lobe suggest pneumonia. This is associated bibasilar collapse / consolidation.  Feeding tube tip is in the mid stomach.  CT ABDOMEN AND PELVIS  Findings:  Low attenuation in the liver parenchyma along the falciform ligament may be related to an area of fatty infiltration. No focal abnormality in the spleen on this study performed without intravenous contrast material.  The duodenum, pancreas, and adrenal glands are unremarkable.  No stones are seen in either kidney. Mild fullness of the right intrarenal collecting system is evident and there appears to be some subtle right perinephric and proximal periureteric edema.  No abdominal aortic aneurysm.  No evidence for free fluid or lymphadenopathy in the abdomen.  There is no bowel obstruction.  There is a small amount of free fluid in the pelvis.  The patient  appears to have some extraperitoneal edema/inflammation in the right retroperitoneal tissues tracking down into the right pelvic sidewall.  Foley catheter decompresses the urinary bladder.  Uterus is unremarkable.  No definite adnexal mass.  No pelvic sidewall lymphadenopathy.  Scattered diverticuli are seen in the sigmoid colon without diverticulitis.  Terminal ileum is unremarkable.  The retrocecal appendix is normal.  Bone windows show a  sclerotic lesion in the left lesser trochanter, incompletely visualized.  Defect in the left iliac crest suggests site of prior bone harvest. There is some body wall edema in the region of the pelvis and gas in the anterior subcutaneous fat of the lower left abdominal wall is presumably from injection site.  IMPRESSION: No definite findings to account for the reported history of clinical sepsis.  There is some fullness of the right intrarenal collecting system with right periureteric edema/inflammation and apparent fluid or inflammation in the right retroperitoneal tissues tracking down to the right pelvic sidewall.  No evidence for right psoas enlargement suggests the presence of an underlying psoas abscess although the evaluation is limited by the lack of intravenous contrast material. Recent right urinary stone passage or right pyelonephritis would be a consideration.  Sclerotic lesion in the left proximal femurs incompletely visualized.  This has relatively well defined margins were visualized and is probably benign.  Left hip films may prove helpful to further evaluate.  Original Report Authenticated By: ERIC A. MANSELL, M.D.   Ct Chest Wo Contrast  02/08/2012  *RADIOLOGY REPORT*  Clinical Data:  Sepsis  CT CHEST, ABDOMEN AND PELVIS WITHOUT CONTRAST  Technique:  Multidetector CT imaging of the chest, abdomen and pelvis was performed following the standard protocol without IV contrast.  Comparison:  The 04/09/2004.  CT CHEST  Findings:  Endotracheal tube tip is seen in the mid trachea.  Right IJ central line tip projects at the mid SVC level.  Feeding tube tip is in the mid stomach.  No axillary lymphadenopathy.  Scattered small lymph nodes are seen in the mediastinum.  No overt hilar lymphadenopathy.  The heart is enlarged.  Coronary artery calcification is evident.  No pericardial effusion.  Probable tiny bilateral pleural effusions.  Evaluation of fine detail in the lungs is obscured by patient breathing  motion.  There is patchy airspace disease in the left upper lobe with bibasilar collapse / consolidation.  IMPRESSION: Patchy airspace disease in the left upper lobe suggest pneumonia. This is associated bibasilar collapse / consolidation.  Feeding tube tip is in the mid stomach.  CT ABDOMEN AND PELVIS  Findings:  Low attenuation in the liver parenchyma along the falciform ligament may be related to an area of fatty infiltration. No focal abnormality in the spleen on this study performed without intravenous contrast material.  The duodenum, pancreas, and adrenal glands are unremarkable.  No stones are seen in either kidney. Mild fullness of the right intrarenal collecting system is evident and there appears to be some subtle right perinephric and proximal periureteric edema.  No abdominal aortic aneurysm.  No evidence for free fluid or lymphadenopathy in the abdomen.  There is no bowel obstruction.  There is a small amount of free fluid in the pelvis.  The patient appears to have some extraperitoneal edema/inflammation in the right retroperitoneal tissues tracking down into the right pelvic sidewall.  Foley catheter decompresses the urinary bladder.  Uterus is unremarkable.  No definite adnexal mass.  No pelvic sidewall lymphadenopathy.  Scattered diverticuli are seen in the sigmoid colon without diverticulitis.  Terminal ileum is unremarkable.  The retrocecal appendix is normal.  Bone windows show a sclerotic lesion in the left lesser trochanter, incompletely visualized.  Defect in the left iliac crest suggests site of prior bone harvest. There is some body wall edema in the region of the pelvis and gas in the anterior subcutaneous fat of the lower left abdominal wall is presumably from injection site.  IMPRESSION: No definite findings to account for the reported history of clinical sepsis.  There is some fullness of the right intrarenal collecting system with right periureteric edema/inflammation and apparent  fluid or inflammation in the right retroperitoneal tissues tracking down to the right pelvic sidewall.  No evidence for right psoas enlargement suggests the presence of an underlying psoas abscess although the evaluation is limited by the lack of intravenous contrast material. Recent right urinary stone passage or right pyelonephritis would be a consideration.  Sclerotic lesion in the left proximal femurs incompletely visualized.  This has relatively well defined margins were visualized and is probably benign.  Left hip films may prove helpful to further evaluate.  Original Report Authenticated By: ERIC A. MANSELL, M.D.   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  Altered level of consciousness.  Question infection.  MRI OF THE RIGHT THIGH WITHOUT CONTRAST  Technique:  Multiplanar, multisequence MR imaging of the right thigh was performed.  No intravenous contrast was administered.  Comparison:  Limited comparison is made with pelvic CT 02/08/2012.  Findings:  Study was performed using the body coil.  Both thighs are included on the axial and coronal images.  The patient is status post distal femoral diaphyseal amputation.  The amputation site appears sharp.  Low-level marrow edema within the distal femoral marrow is nonspecific. No cortical destruction is seen.  As partially imaged on preceding pelvic CT, there is endosteal sclerosis in the left femoral neck extending into the subtrochanteric region.  There is no associated periosteal reaction, marrow edema or focal surrounding soft tissue abnormality.  This is likely a longstanding process.  There is diffuse muscular atrophy in both thighs, especially within the right hamstring musculature.  There is nonspecific multi focal muscular edema, most notably within the right vastus medialis muscle.  Edema is present throughout the left thigh musculature as well.  There is subcutaneous edema  in both thighs, greater on the left.  No focal fluid collections are identified.  The visualized bony pelvis demonstrates no significant findings. There is a moderate amount of free pelvic fluid.  Foley catheter is in place.  IMPRESSION:  1.  No specific findings of osteomyelitis status post right above- the-knee amputation.  The amputation site has an expected appearance. 2.  Muscular and subcutaneous edema in both thighs, nonspecific. Myofasciitis not excluded.  There is no focal soft tissue abscess. 3.  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  Original Report Authenticated By: Gerrianne Scale, M.D.   Mr Mra Ext Low Left W/o Cm  02/09/2012  *RADIOLOGY REPORT*  Clinical Data:  Sepsis and status post right above knee amputation on the 02/05/2012 for gangrene of the right lower extremity.  MRA OF THE RIGHT AND LEFT LOWER EXTREMITIES WITHOUT CONTRAST  Technique:  Multiplanar multisequence MR imaging of the extremity was performed.  MRA sequences were obtained utilizing 2-D time-of- flight technique.  No intravenous contrast was administered due to renal failure.  Comparison:   None.  Findings:  The right lower  extremity demonstrates patent flow via external and common femoral arteries.  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  Profunda femoral artery and its branches are open into the thigh and to the level of the above knee amputation.  The left lower extremity demonstrates patent external iliac and common femoral arteries.  SFA and profunda femoral arteries are open and show no obstructing lesions.  The popliteal artery is open.  Within the calf, patent flow is demonstrated initially via anterior tibial, posterior tibial and peroneal arteries.  These are able to be followed to the distal calf level.  The vessels are poorly defined at the level of the ankle and foot, likely due to slow flow and limitations of 2-D time-of-flight imaging.  IMPRESSION:   1.  Right lower extremity demonstrates SFA occlusion.  Profunda femoral artery branches are open to the level of above knee amputation. 2.  Left lower extremity demonstrates no significant proximal inflow disease.  Patent flow is demonstrated in the calf via tibial arteries.  Definition of tibial arteries at the level of the ankle and foot is limited, likely reflecting slow flow.  Original Report Authenticated By: Reola Calkins, M.D.   Mr Maxine Glenn Ext Low Right W/o Cm  02/09/2012  *RADIOLOGY REPORT*  Clinical Data:  Sepsis and status post right above knee amputation on the 02/05/2012 for gangrene of the right lower extremity.  MRA OF THE RIGHT AND LEFT LOWER EXTREMITIES WITHOUT CONTRAST  Technique:  Multiplanar multisequence MR imaging of the extremity was performed.  MRA sequences were obtained utilizing 2-D time-of- flight technique.  No intravenous contrast was administered due to renal failure.  Comparison:   None.  Findings:  The right lower extremity demonstrates patent flow via external and common femoral arteries.  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  Profunda femoral artery and its branches are open into the thigh and to the level of the above knee amputation.  The left lower extremity demonstrates patent external iliac and common femoral arteries.  SFA and profunda femoral arteries are open and show no obstructing lesions.  The popliteal artery is open.  Within the calf, patent flow is demonstrated initially via anterior tibial, posterior tibial and peroneal arteries.  These are able to be followed to the distal calf level.  The vessels are poorly defined at the level of the ankle and foot, likely due to slow flow and limitations of 2-D time-of-flight imaging.  IMPRESSION:  1.  Right lower extremity demonstrates SFA occlusion.  Profunda femoral artery branches are open to the level of above knee amputation. 2.  Left lower extremity demonstrates no significant  proximal inflow disease.  Patent flow is demonstrated in the calf via tibial arteries.  Definition of tibial arteries at the level of the ankle and foot is limited, likely reflecting slow flow.  Original Report Authenticated By: Reola Calkins, M.D.   US Renal Port  02/09/2012  *RADIOLOGY REPORT*  Clinical Data: Elevated creatinine, evaluate for hydronephrosis  RENAL/URINARY TRACT ULTRASOUND COMPLETE  Comparison:  CT abdomen pelvis dated 02/08/2012  Findings:  Evaluation is mildly constrained due to portable technique.  Right Kidney:  Measures 11.6 cm.  No mass or hydronephrosis.  Left Kidney:  Measures 11.2 cm.  No mass or hydronephrosis.  Bladder:  Nonvisualized secondary to indwelling Foley catheter.  IMPRESSION: No hydronephrosis.  Original Report Authenticated By: Charline Bills, M.D.   Dg Chest Port 1 View  02/10/2012  *RADIOLOGY REPORT*  Clinical Data:  Evaluate endotracheal tube placement.  PORTABLE CHEST - 1 VIEW  Comparison: Chest x-ray 02/09/2012.  Findings: Compared to the prior examination there has been interval advancement of the endotracheal tube which is now only 7 mm above the carina. A feeding tube is seen extending into the abdomen, tip within the body of the stomach.  Lung volumes are low.  There are persistent bibasilar opacities which may represent areas of atelectasis and/or consolidation, with superimposed small bilateral pleural effusions.  Overall, aeration has slightly improved.  No evidence of pulmonary edema.  Heart size is mildly enlarged.  The patient is rotated to the right on today's exam, resulting in distortion of the mediastinal contours and reduced diagnostic sensitivity and specificity for mediastinal pathology. Atherosclerotic calcifications within the arch of the aorta.  IMPRESSION: 1.  Low-lying endotracheal tube.  Consider withdrawal 3-4 cm for more optimal placement. 2.  Slight improvement in aeration, however, there continues to be extensive areas of bibasilar  atelectasis and/or consolidation with superimposed small bilateral pleural effusions. 3.  Atherosclerosis.  These results were called by telephone on 02/10/2012 at 09:50 a.m. to nurse Lauren in the ICU, who verbally acknowledged these results.  Original Report Authenticated By: Florencia Reasons, M.D.   Dg Chest Port 1 View  02/09/2012  *RADIOLOGY REPORT*  Clinical Data: Evaluate endotracheal tube, shortness of breath  PORTABLE CHEST - 1 VIEW  Comparison: 02/08/2012; 02/07/2012; 02/06/2012; 02/05/2012; chest CT of 02/08/2012  Findings: Grossly unchanged enlarged cardiac silhouette and mediastinal contours.  Stable position of support apparatus.  No definite pneumothorax, though the right lung apex excluded secondary to overlying chin.  Small bilateral effusions and perihilar and basilar opacities are grossly unchanged.  Unchanged bones.  IMPRESSION: 1.  Stable positioning of support apparatus.  No pneumothorax. 2.  Unchanged small bilateral effusions and perihilar/bibasilar opacities, atelectasis versus infiltrate.  Original Report Authenticated By: Waynard Reeds, M.D.    Medications:  Scheduled:    . amLODipine  5 mg Oral Daily  . antiseptic oral rinse  15 mL Mouth Rinse QID  . atorvastatin  10 mg Oral Daily  . baclofen  10 mg Oral BID  . chlorhexidine  15 mL Mouth Rinse BID  . feeding supplement  30 mL Per Tube TID WC  . feeding supplement (PROMOTE)  1,000 mL Per Tube Q24H  . gabapentin  300 mg Oral QID  . heparin  5,000 Units Subcutaneous Q8H  . hydrALAZINE  25 mg Oral Q8H  . hydrochlorothiazide  12.5 mg Oral Daily  . imipenem-cilastatin  250 mg Intravenous Q6H  . insulin aspart  0-15 Units Subcutaneous Q4H  . insulin glargine  5 Units Subcutaneous Daily  . lactulose  30 g Oral Once  . levothyroxine  26 mcg Intravenous Daily  . pantoprazole sodium  40 mg Per Tube QHS  . potassium chloride  40 mEq Per Tube Once  . potassium chloride  40 mEq Per Tube Once  . potassium chloride        . sodium chloride  3 mL Intravenous Q12H  . vancomycin  1,000 mg Intravenous Q24H  . DISCONTD: antiseptic oral rinse  15 mL Mouth Rinse BID  . DISCONTD: chlorhexidine  15 mL Mouth/Throat BID  . DISCONTD: feeding supplement (OSMOLITE 1.2 CAL)  1,000 mL Per Tube Q24H  . DISCONTD: hydrocortisone sodium succinate  50 mg Intravenous Q8H  . DISCONTD: insulin aspart protamine-insulin aspart  12 Units Subcutaneous Q supper  . DISCONTD: levothyroxine  38 mcg Intravenous Daily  .  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 Currently on CCM service due to concern for sepsis. Was re-intubated on 5/18 because of unresponsiveness, decreased sensorium.   # sepsis in the setting of left posterior calcaneal osteomyelitis:  S/p right AKA. Post op day 5 - renal US not showing any evidence of hydronephrosis - WBC improving - on vanc (start 5/14) and imipenem and cilastatin (start: 5/20)  # acute encephalopathy:  CCM managing. Patient intubated and sedated for airway protection. Neurology on board. Negative MRI, CT. Initial EEG showing moderate encephalopathy of nonspecific etiology. Repeat EEG showed confirmed encephalopathy of non specific etiology. There is question as to whether there is ongoing seizure activity. Neuro recommends trial of benzos to determine if there is clinical response.  - likely metabolic encephalopathy - follows commands   # Peripheral vascular disease:  - continue to monitor left heal and foot  - follow up vascular surgery recommendations for potential revascularization of left leg in the future.   # Acute renal insufficiency: likely from hypovolemia and ATN. Stable at 2.15  today from 2.25.   # Acute Respiratory failure: no plan for extubation until improvement of mental status.  # Diabetes:  CBG (last 3)   Basename 02/10/12 1105 02/10/12 0738 02/10/12 0424  GLUCAP 229* 215* 226*   On NPH at 12u daily and insulin sliding scale.  - sliding scale coverage  # h/o spinal cord infarct:  - continue home meds for spasms and pain: baclofen 10bid, gabapentin 300 qid # diastolic CHF grade 2 diastolic dysfunction with EF of 60-65% on May 2012 echo Elevated BNP at 9763. Received dose of IV lasix 40.  # h/o gout: hold allopurinol # h/o HTN: elevated BP: - on amlodipine 5, hctz 12.5 and hydralazine prn #h/o hypothyroid:  - continue home levothyroxine  - normal TSH PPx:  - heparin sub q  PPI Fen/Gi: npo Dispo: currently on CCM's service.    LOS: 8 days   Marena Chancy  MD 02/10/2012, 12:23 PM

## 2012-02-10 NOTE — Progress Notes (Addendum)
[facility]   Postoperative Visit - Amputation  Date of Surgery: 02/02/2012 - 02/05/2012 Procedure(s): AMPUTATION BELOW KNEE Right Surgeon: Surgeon(s): Larina Earthly, MD POD: 5 Days Post-Op  Subjective Alexandra Wiley is a 66 y.o. female who is S/P Right Procedure(s): AMPUTATION BELOW KNEE.  Pt sedated on vent but opens eyes to name.  Significant Diagnostic Studies: CBC Lab Results  Component Value Date   WBC 19.7* 02/10/2012   HGB 8.9* 02/10/2012   HCT 27.2* 02/10/2012   MCV 81.0 02/10/2012   PLT 427* 02/10/2012    BMET    Component Value Date/Time   NA 140 02/10/2012 0355   K 3.6 02/10/2012 0355   CL 103 02/10/2012 0355   CO2 28 02/10/2012 0355   GLUCOSE 234* 02/10/2012 0355   BUN 32* 02/10/2012 0355   CREATININE 2.15* 02/10/2012 0355   CALCIUM 8.8 02/10/2012 0355   GFRNONAA 22* 02/10/2012 0355   GFRAA 26* 02/10/2012 0355    COAG Lab Results  Component Value Date   INR 1.11 02/05/2012   INR 1.16 02/03/2012   INR 1.0 11/19/2008   No results found for this basename: PTT     Intake/Output Summary (Last 24 hours) at 02/10/12 0800 Last data filed at 02/10/12 0600  Gross per 24 hour  Intake   1314 ml  Output   4430 ml  Net  -3116 ml     Physical Examination  BP Readings from Last 3 Encounters:  02/10/12 161/56  02/10/12 161/56  02/10/12 161/56   Temp Readings from Last 3 Encounters:  02/10/12 98.6 F (37 C) Oral  02/10/12 98.6 F (37 C) Oral  02/10/12 98.6 F (37 C) Oral   SpO2 Readings from Last 3 Encounters:  02/10/12 100%  02/10/12 100%  02/10/12 100%   Pulse Readings from Last 3 Encounters:  02/10/12 76  02/10/12 76  02/10/12 76    Pt is sedate on vent  Right amputation wound is clean and dressed with no drainage.  There is good bone coverage in the stump Stump is warm and well perfused, without drainage; without erythema LLE warm and stable  Assessment/plan:  Alexandra Wiley is a 66 y.o. female who is s/p Right  Procedure(s): AMPUTATION BELOW KNEE  The patient's stump is viable.  May leave dressing off  Afebrile and white count down today   ROCZNIAK,REGINA J 8:00 AM 02/10/2012 [phone]  I have examined the patient, reviewed and agree with above.  Chaddrick Brue, MD 02/10/2012 2:50 PM

## 2012-02-10 NOTE — Progress Notes (Signed)
INFECTIOUS DISEASE PROGRESS NOTE  ID: Alexandra Wiley is a 66 y.o. female with   Principal Problem:  *Osteomyelitis of right foot Active Problems:  Conus medullaris syndrome  Septic shock  Oliguria  Diabetes mellitus  Altered mental status  Acute respiratory failure  Subjective: On vent. No response.   Abtx:  Anti-infectives     Start     Dose/Rate Route Frequency Ordered Stop   02/08/12 1200   imipenem-cilastatin (PRIMAXIN) 250 mg in sodium chloride 0.9 % 100 mL IVPB        250 mg 200 mL/hr over 30 Minutes Intravenous 4 times per day 02/08/12 1046     02/07/12 1500   piperacillin-tazobactam (ZOSYN) IVPB 2.25 g  Status:  Discontinued        2.25 g 100 mL/hr over 30 Minutes Intravenous 3 times per day 02/07/12 1458 02/08/12 1046   02/07/12 0800   vancomycin (VANCOCIN) IVPB 1000 mg/200 mL premix        1,000 mg 200 mL/hr over 60 Minutes Intravenous Every 24 hours 02/07/12 1459     02/03/12 2000   vancomycin (VANCOCIN) IVPB 1000 mg/200 mL premix  Status:  Discontinued        1,000 mg 200 mL/hr over 60 Minutes Intravenous Every 12 hours 02/03/12 0919 02/07/12 1459   02/03/12 1730   piperacillin-tazobactam (ZOSYN) IVPB 3.375 g  Status:  Discontinued        3.375 g 100 mL/hr over 30 Minutes Intravenous  Once 02/03/12 1723 02/03/12 1725   02/03/12 1730   vancomycin (VANCOCIN) IVPB 1000 mg/200 mL premix  Status:  Discontinued        1,000 mg 200 mL/hr over 60 Minutes Intravenous  Once 02/03/12 1723 02/03/12 1725   02/03/12 0100   piperacillin-tazobactam (ZOSYN) IVPB 3.375 g  Status:  Discontinued        3.375 g 12.5 mL/hr over 240 Minutes Intravenous Every 8 hours 02/02/12 2233 02/07/12 1458   02/02/12 1700   vancomycin (VANCOCIN) 1,250 mg in sodium chloride 0.9 % 250 mL IVPB  Status:  Discontinued        1,250 mg 166.7 mL/hr over 90 Minutes Intravenous Every 12 hours 02/02/12 1613 02/03/12 0919   02/02/12 1545  piperacillin-tazobactam (ZOSYN) 3.375 g in dextrose 5 % 50  mL IVPB       3.375 g 100 mL/hr over 30 Minutes Intravenous  Once 02/02/12 1530 02/02/12 1937          Medications:  Scheduled:   . amLODipine  5 mg Oral Daily  . antiseptic oral rinse  15 mL Mouth Rinse QID  . atorvastatin  10 mg Oral Daily  . baclofen  10 mg Oral BID  . chlorhexidine  15 mL Mouth Rinse BID  . feeding supplement  30 mL Per Tube TID WC  . feeding supplement (PROMOTE)  1,000 mL Per Tube Q24H  . gabapentin  300 mg Oral QID  . heparin  5,000 Units Subcutaneous Q8H  . hydrALAZINE  25 mg Oral Q8H  . hydrochlorothiazide  12.5 mg Oral Daily  . imipenem-cilastatin  250 mg Intravenous Q6H  . insulin aspart  0-15 Units Subcutaneous Q4H  . insulin glargine  5 Units Subcutaneous Daily  . lactulose  30 g Oral Once  . levothyroxine  26 mcg Intravenous Daily  . pantoprazole sodium  40 mg Per Tube QHS  . potassium chloride  40 mEq Per Tube Once  . potassium chloride  40 mEq Per Tube Once  .  potassium chloride      . sodium chloride  3 mL Intravenous Q12H  . vancomycin  1,000 mg Intravenous Q24H  . DISCONTD: antiseptic oral rinse  15 mL Mouth Rinse BID  . DISCONTD: chlorhexidine  15 mL Mouth/Throat BID  . DISCONTD: hydrocortisone sodium succinate  50 mg Intravenous Q8H  . DISCONTD: insulin aspart protamine-insulin aspart  12 Units Subcutaneous Q supper  . DISCONTD: levothyroxine  38 mcg Intravenous Daily  . DISCONTD: pantoprazole (PROTONIX) IV  40 mg Intravenous QHS    Objective: Vital signs in last 24 hours: Temp:  [98.6 F (37 C)-99.2 F (37.3 C)] 98.6 F (37 C) (05/22 1157) Pulse Rate:  [57-130] 107  (05/22 1600) Resp:  [15-33] 16  (05/22 1600) BP: (92-197)/(42-87) 145/83 mmHg (05/22 1600) SpO2:  [99 %-100 %] 99 % (05/22 1600) FiO2 (%):  [29.8 %-40.3 %] 30.3 % (05/22 1600) Weight:  [76.5 kg (168 lb 10.4 oz)] 76.5 kg (168 lb 10.4 oz) (05/22 0442)   Resp: rhonchi bilaterally Cardio: regular rate and rhythm GI: normal findings: bowel sounds normal and soft,  non-tender Extremities: L heal is clean, no d/c. r stump is dressed. LUE peripheral IV.   Lab Results  Basename 02/10/12 0355 02/09/12 1752 02/09/12 1238  WBC 19.7* -- 22.1*  HGB 8.9* -- 8.7*  HCT 27.2* -- 26.0*  NA 140 142 --  K 3.6 3.2* --  CL 103 104 --  CO2 28 28 --  BUN 32* 24* --  CREATININE 2.15* 2.11* --  GLU -- -- --   Liver Panel  Basename 02/10/12 0355 02/09/12 0600  PROT 6.0 5.9*  ALBUMIN 2.4* 2.3*  AST 17 17  ALT 13 16  ALKPHOS 90 71  BILITOT 0.2* 0.2*  BILIDIR -- --  IBILI -- --   Sedimentation Rate No results found for this basename: ESRSEDRATE in the last 72 hours C-Reactive Protein No results found for this basename: CRP:2 in the last 72 hours  Microbiology: Recent Results (from the past 240 hour(s))  MRSA PCR SCREENING     Status: Normal   Collection Time   02/03/12  6:10 PM      Component Value Range Status Comment   MRSA by PCR NEGATIVE  NEGATIVE  Final   CULTURE, BLOOD (ROUTINE X 2)     Status: Normal   Collection Time   02/03/12  6:15 PM      Component Value Range Status Comment   Specimen Description BLOOD CENTRAL LINE   Final    Special Requests     Final    Value: BOTTLES DRAWN AEROBIC AND ANAEROBIC 10CC LEFT IJ CVC   Culture  Setup Time [id number]   Final    Culture NO GROWTH 5 DAYS   Final    Report Status 02/10/2012 FINAL   Final   URINE CULTURE     Status: Normal   Collection Time   02/03/12  6:20 PM      Component Value Range Status Comment   Specimen Description URINE, CATHETERIZED   Final    Special Requests NONE   Final    Culture  Setup Time [id number]   Final    Colony Count NO GROWTH   Final    Culture NO GROWTH   Final    Report Status 02/05/2012 FINAL   Final   CULTURE, BLOOD (ROUTINE X 2)     Status: Normal   Collection Time   02/03/12  7:58 PM      Component  Value Range Status Comment   Specimen Description BLOOD HAND RIGHT   Final    Special Requests BOTTLES DRAWN AEROBIC ONLY [facility]   Final    Culture  Setup  Time [id number]   Final    Culture NO GROWTH 5 DAYS   Final    Report Status 02/10/2012 FINAL   Final   CULTURE, BLOOD (ROUTINE X 2)     Status: Normal (Preliminary result)   Collection Time   02/07/12 11:42 AM      Component Value Range Status Comment   Specimen Description BLOOD RIGHT HAND   Final    Special Requests BOTTLES DRAWN AEROBIC ONLY 3CC   Final    Culture  Setup Time [id number]   Final    Culture     Final    Value:        BLOOD CULTURE RECEIVED NO GROWTH TO DATE CULTURE WILL BE HELD FOR 5 DAYS BEFORE ISSUING A FINAL NEGATIVE REPORT   Report Status PENDING   Incomplete   CULTURE, BLOOD (ROUTINE X 2)     Status: Normal (Preliminary result)   Collection Time   02/07/12  1:37 PM      Component Value Range Status Comment   Specimen Description BLOOD LEFT HAND   Final    Special Requests BOTTLES DRAWN AEROBIC AND ANAEROBIC 3CC   Final    Culture  Setup Time [id number]   Final    Culture     Final    Value:        BLOOD CULTURE RECEIVED NO GROWTH TO DATE CULTURE WILL BE HELD FOR 5 DAYS BEFORE ISSUING A FINAL NEGATIVE REPORT   Report Status PENDING   Incomplete   CULTURE, RESPIRATORY     Status: Normal   Collection Time   02/07/12  2:22 PM      Component Value Range Status Comment   Specimen Description TRACHEAL ASPIRATE   Final    Special Requests NONE   Final    Gram Stain     Final    Value: FEW WBC PRESENT, PREDOMINANTLY PMN     NO SQUAMOUS EPITHELIAL CELLS SEEN     NO ORGANISMS SEEN   Culture Non-Pathogenic Oropharyngeal-type Flora Isolated.   Final    Report Status 02/10/2012 FINAL   Final     Studies/Results: Mr Femur Right Wo Contrast  02/08/2012  *RADIOLOGY REPORT*  Clinical Data: Right above the knee amputation 02/05/2012.  Altered level of consciousness.  Question infection.  MRI OF THE RIGHT THIGH WITHOUT CONTRAST  Technique:  Multiplanar, multisequence MR imaging of the right thigh was performed.  No intravenous contrast was administered.  Comparison:   Limited comparison is made with pelvic CT 02/08/2012.  Findings:  Study was performed using the body coil.  Both thighs are included on the axial and coronal images.  The patient is status post distal femoral diaphyseal amputation.  The amputation site appears sharp.  Low-level marrow edema within the distal femoral marrow is nonspecific. No cortical destruction is seen.  As partially imaged on preceding pelvic CT, there is endosteal sclerosis in the left femoral neck extending into the subtrochanteric region.  There is no associated periosteal reaction, marrow edema or focal surrounding soft tissue abnormality.  This is likely a longstanding process.  There is diffuse muscular atrophy in both thighs, especially within the right hamstring musculature.  There is nonspecific multi focal muscular edema, most notably within the right vastus medialis muscle.  Edema is present  throughout the left thigh musculature as well.  There is subcutaneous edema in both thighs, greater on the left.  No focal fluid collections are identified.  The visualized bony pelvis demonstrates no significant findings. There is a moderate amount of free pelvic fluid.  Foley catheter is in place.  IMPRESSION:  1.  No specific findings of osteomyelitis status post right above- the-knee amputation.  The amputation site has an expected appearance. 2.  Muscular and subcutaneous edema in both thighs, nonspecific. Myofasciitis not excluded.  There is no focal soft tissue abscess. 3.  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  Original Report Authenticated By: Gerrianne Scale, M.D.   Mr Mra Ext Low Left W/o Cm  02/09/2012  *RADIOLOGY REPORT*  Clinical Data:  Sepsis and status post right above knee amputation on the 02/05/2012 for gangrene of the right lower extremity.  MRA OF THE RIGHT AND LEFT LOWER EXTREMITIES WITHOUT CONTRAST  Technique:  Multiplanar multisequence MR imaging of the extremity was performed.  MRA  sequences were obtained utilizing 2-D time-of- flight technique.  No intravenous contrast was administered due to renal failure.  Comparison:   None.  Findings:  The right lower extremity demonstrates patent flow via external and common femoral arteries.  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  Profunda femoral artery and its branches are open into the thigh and to the level of the above knee amputation.  The left lower extremity demonstrates patent external iliac and common femoral arteries.  SFA and profunda femoral arteries are open and show no obstructing lesions.  The popliteal artery is open.  Within the calf, patent flow is demonstrated initially via anterior tibial, posterior tibial and peroneal arteries.  These are able to be followed to the distal calf level.  The vessels are poorly defined at the level of the ankle and foot, likely due to slow flow and limitations of 2-D time-of-flight imaging.  IMPRESSION:  1.  Right lower extremity demonstrates SFA occlusion.  Profunda femoral artery branches are open to the level of above knee amputation. 2.  Left lower extremity demonstrates no significant proximal inflow disease.  Patent flow is demonstrated in the calf via tibial arteries.  Definition of tibial arteries at the level of the ankle and foot is limited, likely reflecting slow flow.  Original Report Authenticated By: Reola Calkins, M.D.   Mr Maxine Glenn Ext Low Right W/o Cm  02/09/2012  *RADIOLOGY REPORT*  Clinical Data:  Sepsis and status post right above knee amputation on the 02/05/2012 for gangrene of the right lower extremity.  MRA OF THE RIGHT AND LEFT LOWER EXTREMITIES WITHOUT CONTRAST  Technique:  Multiplanar multisequence MR imaging of the extremity was performed.  MRA sequences were obtained utilizing 2-D time-of- flight technique.  No intravenous contrast was administered due to renal failure.  Comparison:   None.  Findings:  The right lower extremity  demonstrates patent flow via external and common femoral arteries.  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  Profunda femoral artery and its branches are open into the thigh and to the level of the above knee amputation.  The left lower extremity demonstrates patent external iliac and common femoral arteries.  SFA and profunda femoral arteries are open and show no obstructing lesions.  The popliteal artery is open.  Within the calf, patent flow is demonstrated initially via anterior tibial, posterior tibial and peroneal arteries.  These are able to be followed to the  distal calf level.  The vessels are poorly defined at the level of the ankle and foot, likely due to slow flow and limitations of 2-D time-of-flight imaging.  IMPRESSION:  1.  Right lower extremity demonstrates SFA occlusion.  Profunda femoral artery branches are open to the level of above knee amputation. 2.  Left lower extremity demonstrates no significant proximal inflow disease.  Patent flow is demonstrated in the calf via tibial arteries.  Definition of tibial arteries at the level of the ankle and foot is limited, likely reflecting slow flow.  Original Report Authenticated By: Reola Calkins, M.D.   US Renal Port  02/09/2012  *RADIOLOGY REPORT*  Clinical Data: Elevated creatinine, evaluate for hydronephrosis  RENAL/URINARY TRACT ULTRASOUND COMPLETE  Comparison:  CT abdomen pelvis dated 02/08/2012  Findings:  Evaluation is mildly constrained due to portable technique.  Right Kidney:  Measures 11.6 cm.  No mass or hydronephrosis.  Left Kidney:  Measures 11.2 cm.  No mass or hydronephrosis.  Bladder:  Nonvisualized secondary to indwelling Foley catheter.  IMPRESSION: No hydronephrosis.  Original Report Authenticated By: Charline Bills, M.D.   Dg Chest Port 1 View  02/10/2012  *RADIOLOGY REPORT*  Clinical Data: Evaluate endotracheal tube placement.  PORTABLE CHEST - 1 VIEW  Comparison: Chest x-ray  02/09/2012.  Findings: Compared to the prior examination there has been interval advancement of the endotracheal tube which is now only 7 mm above the carina. A feeding tube is seen extending into the abdomen, tip within the body of the stomach.  Lung volumes are low.  There are persistent bibasilar opacities which may represent areas of atelectasis and/or consolidation, with superimposed small bilateral pleural effusions.  Overall, aeration has slightly improved.  No evidence of pulmonary edema.  Heart size is mildly enlarged.  The patient is rotated to the right on today's exam, resulting in distortion of the mediastinal contours and reduced diagnostic sensitivity and specificity for mediastinal pathology. Atherosclerotic calcifications within the arch of the aorta.  IMPRESSION: 1.  Low-lying endotracheal tube.  Consider withdrawal 3-4 cm for more optimal placement. 2.  Slight improvement in aeration, however, there continues to be extensive areas of bibasilar atelectasis and/or consolidation with superimposed small bilateral pleural effusions. 3.  Atherosclerosis.  These results were called by telephone on 02/10/2012 at 09:50 a.m. to nurse Lauren in the ICU, who verbally acknowledged these results.  Original Report Authenticated By: Florencia Reasons, M.D.   Dg Chest Port 1 View  02/09/2012  *RADIOLOGY REPORT*  Clinical Data: Evaluate endotracheal tube, shortness of breath  PORTABLE CHEST - 1 VIEW  Comparison: 02/08/2012; 02/07/2012; 02/06/2012; 02/05/2012; chest CT of 02/08/2012  Findings: Grossly unchanged enlarged cardiac silhouette and mediastinal contours.  Stable position of support apparatus.  No definite pneumothorax, though the right lung apex excluded secondary to overlying chin.  Small bilateral effusions and perihilar and basilar opacities are grossly unchanged.  Unchanged bones.  IMPRESSION: 1.  Stable positioning of support apparatus.  No pneumothorax. 2.  Unchanged small bilateral effusions and  perihilar/bibasilar opacities, atelectasis versus infiltrate.  Original Report Authenticated By: Waynard Reeds, M.D.     Assessment/Plan: Sepsis/SIRS  Day 9 Anbx (imipenem/vanco)  MRI with ? Of myositis in thighs  MRI/A does not show significant ischemia- has slow distal flow on L. Will defer to surgery.  BCx are negative (5-15, 5-19) WBC down slightly, Cr slightly up.  Central line changed to peripheral today.  Renal u/s no hydro (as f/u to her CT scan).  If  seizures are a ? Could change her imipenem to merrem (does not lower seizure threshold as much).  Johny Sax Infectious Diseases [phone] 02/10/2012, 4:34 PM   LOS: 8 days

## 2012-02-10 NOTE — Progress Notes (Signed)
ANTIBIOTIC CONSULT NOTE - Follow-Up  Pharmacy Consult for Meropenem Indication: Osteomyelitis R foot 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  No Known Allergies  Patient Measurements: Height: 4\' 11"  (149.9 cm) Weight: 168 lb 10.4 oz (76.5 kg) IBW/kg (Calculated) : 43.2    Vital Signs: Temp: 98.6 F (37 C) (05/22 1157) Temp src: Oral (05/22 1157) BP: 145/83 mmHg (05/22 1600) Pulse Rate: 107  (05/22 1600) Intake/Output from previous day: 05/21 0701 - 05/22 0700 In: 1584 [I.V.:300; NG/GT:670; IV Piggyback:614] Out: 4730 [Urine:4730] Intake/Output from this shift: Total I/O In: 835 [I.V.:70; NG/GT:465; IV Piggyback:300] Out: 1005 [Urine:1005]  Labs:  [facility] 02/10/12 0355 02/09/12 1752 02/09/12 1238 02/09/12 0600  WBC 19.7* -- 22.1* 22.9*  HGB 8.9* -- 8.7* 8.5*  PLT 427* -- 431* 427*  LABCREA -- -- -- --  CREATININE 2.15* 2.11* -- 2.25*   Estimated Creatinine Clearance: 22.3 ml/min (by C-G formula based on Cr of 2.15). No results found for this basename: VANCOTROUGH:2,VANCOPEAK:2,VANCORANDOM:2,GENTTROUGH:2,GENTPEAK:2,GENTRANDOM:2,TOBRATROUGH:2,TOBRAPEAK:2,TOBRARND:2,AMIKACINPEAK:2,AMIKACINTROU:2,AMIKACIN:2, in the last 72 hours   Microbiology: Recent Results (from the past 720 hour(s))  MRSA PCR SCREENING     Status: Normal   Collection Time   02/03/12  6:10 PM      Component Value Range Status Comment   MRSA by PCR NEGATIVE  NEGATIVE  Final   CULTURE, BLOOD (ROUTINE X 2)     Status: Normal   Collection Time   02/03/12  6:15 PM      Component Value Range Status Comment   Specimen Description BLOOD CENTRAL LINE   Final    Special Requests     Final    Value: BOTTLES DRAWN AEROBIC AND ANAEROBIC 10CC LEFT IJ CVC   Culture  Setup Time [id number]   Final    Culture NO GROWTH 5 DAYS   Final    Report Status 02/10/2012 FINAL   Final   URINE CULTURE     Status: Normal   Collection Time   02/03/12  6:20 PM      Component Value Range Status Comment   Specimen Description URINE, CATHETERIZED    Final    Special Requests NONE   Final    Culture  Setup Time [id number]   Final    Colony Count NO GROWTH   Final    Culture NO GROWTH   Final    Report Status 02/05/2012 FINAL   Final   CULTURE, BLOOD (ROUTINE X 2)     Status: Normal   Collection Time   02/03/12  7:58 PM      Component Value Range Status Comment   Specimen Description BLOOD HAND RIGHT   Final    Special Requests BOTTLES DRAWN AEROBIC ONLY [facility]   Final    Culture  Setup Time [id number]   Final    Culture NO GROWTH 5 DAYS   Final    Report Status 02/10/2012 FINAL   Final   CULTURE, BLOOD (ROUTINE X 2)     Status: Normal (Preliminary result)   Collection Time   02/07/12 11:42 AM      Component Value Range Status Comment   Specimen Description BLOOD RIGHT HAND   Final    Special Requests BOTTLES DRAWN AEROBIC ONLY 3CC   Final    Culture  Setup Time [id number]   Final    Culture     Final    Value:        BLOOD CULTURE RECEIVED NO GROWTH TO DATE CULTURE WILL BE HELD FOR 5 DAYS BEFORE ISSUING A  FINAL NEGATIVE REPORT   Report Status PENDING   Incomplete   CULTURE, BLOOD (ROUTINE X 2)     Status: Normal (Preliminary result)   Collection Time   02/07/12  1:37 PM      Component Value Range Status Comment   Specimen Description BLOOD LEFT HAND   Final    Special Requests BOTTLES DRAWN AEROBIC AND ANAEROBIC 3CC   Final    Culture  Setup Time [id number]   Final    Culture     Final    Value:        BLOOD CULTURE RECEIVED NO GROWTH TO DATE CULTURE WILL BE HELD FOR 5 DAYS BEFORE ISSUING A FINAL NEGATIVE REPORT   Report Status PENDING   Incomplete   CULTURE, RESPIRATORY     Status: Normal   Collection Time   02/07/12  2:22 PM      Component Value Range Status Comment   Specimen Description TRACHEAL ASPIRATE   Final    Special Requests NONE   Final    Gram Stain     Final    Value: FEW WBC PRESENT, PREDOMINANTLY PMN     NO SQUAMOUS EPITHELIAL CELLS SEEN     NO ORGANISMS SEEN   Culture Non-Pathogenic  Oropharyngeal-type Flora Isolated.   Final    Report Status 02/10/2012 FINAL   Final     Assessment: 66 y.o. 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 Currently on Vancomycin and Imipenem. Noted changing imipenem to Meropenem due to possible subclinical seizure activity. Noted pt with CrCl ~22 ml/min.  Abx:  5/14: Vanc >> 5/14: Zosyn >> 5/20 5/20: Imipenem >> Cx:  5/19 Resp- NPF 5/19 Blood -ngtd 5/15: MRSA PCR: neg 5/15: Blood x2: neg 5/15: Urine: neg  Goal of Therapy:  Eradication of infection  Plan:  1. Meropenem 500 mg IV q12h.  2. F/u renal function and microbiological data.  Christoper Fabian, PharmD, BCPS Clinical pharmacist, pager [phone] 02/10/2012,5:13 PM

## 2012-02-10 NOTE — Progress Notes (Signed)
Inpatient Diabetes Program Recommendations  AACE/ADA: New Consensus Statement on Inpatient Glycemic Control (2009)  Target Ranges:  Prepandial:   less than 140 mg/dL      Peak postprandial:   less than 180 mg/dL (1-2 hours)      Critically ill patients:  140 - 180 mg/dL    Noted 16/10 insulin d/c'd today and Lantus 5 units daily started today at 10am.  Per home records, patient takes 70/30 insulin 12 units bid with meals.  Will likely need more basal insulin to cover her needs.  70% of the long-acting insulin in her home 70/30 insulin dose is ~16 units.  Inpatient Diabetes Program Recommendations Insulin - Basal: Please increase Lantus to 16 units daily.   Note: Will follow. Ambrose Finland RN, MSN, CDE Diabetes Coordinator Inpatient Diabetes Program [phone]

## 2012-02-10 NOTE — Procedures (Signed)
EEG NUMBER:  [id number].  This routine EEG was requested on this 66 year old woman who has a history of encephalopathy who had a recent EEG revealing background slowing and triphasic waves.  Her medications include gabapentin.  The EEG was done with the patient unresponsive.  Background activities were characterized by poorly organized delta activities with some intermixed theta activities that were of medium amplitude.  Superimposed upon this were frequent triphasic waves that occurred with a frequency up to 1 per second.  They seemed to wax and wane somewhat but were relatively persistent throughout the recording.  There were short periods of suppression at times lasting 2-3 seconds that interrupted the triphasic waves as well as the background.  Painful stimulation was applied to the patient and there did seem to be an interruption in the triphasic waves and an increase in the background frequency to the low frequency theta range.  Photic stimulation did not produce a driving response.  The patient could not be hyperventilated.  No normal activities of sleep were seen.  CLINICAL INTERPRETATION:  This routine EEG done with the patient unresponsive is abnormal.  Background activities mainly in the delta range with some reactivity suggest a moderate encephalopathy of nonspecific etiology.  The overlying triphasic waves are typically seen in toxic and metabolic encephalopathies.  They are much more persistent than the previous EEG, although the seem to have some reactivity to stimulation. Again, it is possible this represents ongoing seizure activity.  It is recommended that the patient have a trial of a benzodiazepine during the EEG to determine if there is an electrophysiologic as well as clinical response.          ______________________________ Denton Meek, MD    [id number] D:  02/10/2012 06:48:12  T:  02/10/2012 07:25:52  Job #:  [id number]

## 2012-02-11 ENCOUNTER — Inpatient Hospital Stay (HOSPITAL_COMMUNITY): Payer: Medicare Other

## 2012-02-11 DIAGNOSIS — R197 Diarrhea, unspecified: Secondary | ICD-10-CM

## 2012-02-11 DIAGNOSIS — R404 Transient alteration of awareness: Secondary | ICD-10-CM

## 2012-02-11 DIAGNOSIS — E119 Type 2 diabetes mellitus without complications: Secondary | ICD-10-CM

## 2012-02-11 LAB — DIFFERENTIAL
Basophils Relative: 0 % (ref 0–1)
Eosinophils Absolute: 0 10*3/uL (ref 0.0–0.7)
Eosinophils Relative: 0 % (ref 0–5)
Lymphs Abs: 3.6 10*3/uL (ref 0.7–4.0)
Monocytes Absolute: 3.2 10*3/uL — ABNORMAL HIGH (ref 0.1–1.0)

## 2012-02-11 LAB — BASIC METABOLIC PANEL
BUN: 45 mg/dL — ABNORMAL HIGH (ref 6–23)
CO2: 31 mEq/L (ref 19–32)
Calcium: 9.3 mg/dL (ref 8.4–10.5)
Glucose, Bld: 192 mg/dL — ABNORMAL HIGH (ref 70–99)
Sodium: 148 mEq/L — ABNORMAL HIGH (ref 135–145)

## 2012-02-11 LAB — CBC
MCH: 27.4 pg (ref 26.0–34.0)
MCHC: 33.1 g/dL (ref 30.0–36.0)
MCV: 82.9 fL (ref 78.0–100.0)
Platelets: 467 10*3/uL — ABNORMAL HIGH (ref 150–400)
RBC: 3.5 MIL/uL — ABNORMAL LOW (ref 3.87–5.11)

## 2012-02-11 LAB — GLUCOSE, CAPILLARY
Glucose-Capillary: 182 mg/dL — ABNORMAL HIGH (ref 70–99)
Glucose-Capillary: 184 mg/dL — ABNORMAL HIGH (ref 70–99)
Glucose-Capillary: 200 mg/dL — ABNORMAL HIGH (ref 70–99)

## 2012-02-11 MED ORDER — ASPIRIN 81 MG PO CHEW
81.0000 mg | CHEWABLE_TABLET | Freq: Every day | ORAL | Status: DC
  Administered 2012-02-11 – 2012-02-23 (×13): 81 mg via ORAL
  Filled 2012-02-11 (×13): qty 1

## 2012-02-11 MED ORDER — LEVOTHYROXINE SODIUM 75 MCG PO TABS
75.0000 ug | ORAL_TABLET | Freq: Every day | ORAL | Status: DC
  Administered 2012-02-11 – 2012-02-23 (×13): 75 ug via ORAL
  Filled 2012-02-11 (×15): qty 1

## 2012-02-11 MED ORDER — VALPROATE SODIUM 500 MG/5ML IV SOLN
1000.0000 mg | Freq: Two times a day (BID) | INTRAVENOUS | Status: DC
  Administered 2012-02-11 – 2012-02-12 (×2): 1000 mg via INTRAVENOUS
  Filled 2012-02-11 (×3): qty 10

## 2012-02-11 MED ORDER — INSULIN GLARGINE 100 UNIT/ML ~~LOC~~ SOLN
10.0000 [IU] | Freq: Every day | SUBCUTANEOUS | Status: DC
  Administered 2012-02-11 – 2012-02-12 (×2): 10 [IU] via SUBCUTANEOUS

## 2012-02-11 MED ORDER — VANCOMYCIN HCL 1000 MG IV SOLR
1500.0000 mg | INTRAVENOUS | Status: DC
  Administered 2012-02-11 – 2012-02-14 (×3): 1500 mg via INTRAVENOUS
  Filled 2012-02-11 (×4): qty 1500

## 2012-02-11 NOTE — Progress Notes (Signed)
Family Medicine Teaching Service [facility] Progress Note  Patient name: Alexandra Wiley Medical record number: [id number] Date of birth: 1945/10/28 Age: 67 y.o. Gender: female    LOS: 9 days   Primary Care Provider: Laurena Slimmer, MD, MD  Overnight Events:    Objective: Vital signs in last 24 hours: Temp:  [97.9 F (36.6 C)-99.7 F (37.6 C)] 97.9 F (36.6 C) (05/23 0756) Pulse Rate:  [71-139] 76  (05/23 1515) Resp:  [15-24] 17  (05/23 1515) BP: (116-188)/(42-101) 116/42 mmHg (05/23 1515) SpO2:  [97 %-100 %] 100 % (05/23 1515) FiO2 (%):  [29.9 %-40.1 %] 40 % (05/23 1515) Weight:  [167 lb 15.9 oz (76.2 kg)] 167 lb 15.9 oz (76.2 kg) (05/23 0400)  Wt Readings from Last 3 Encounters:  02/11/12 167 lb 15.9 oz (76.2 kg)  02/11/12 167 lb 15.9 oz (76.2 kg)  02/11/12 167 lb 15.9 oz (76.2 kg)     Current Facility-Administered Medications  Medication Dose Route Frequency Provider Last Rate Last Dose  . 0.45 % sodium chloride infusion   Intravenous Continuous [name], MD 10 mL/hr at 02/09/12 2000 10 mL at 02/09/12 2000  . acetaminophen (TYLENOL) tablet 650 mg  650 mg Oral Q6H PRN Lonia Skinner, MD   650 mg at 02/04/12 0804  . amLODipine (NORVASC) tablet 5 mg  5 mg Oral Daily Simonne Martinet, NP   5 mg at 02/11/12 1047  . antiseptic oral rinse (BIOTENE) solution 15 mL  15 mL Mouth Rinse QID Alyson Reedy, MD   15 mL at 02/11/12 1200  . aspirin chewable tablet 81 mg  81 mg Oral Daily Coralyn Helling, MD   81 mg at 02/11/12 1048  . atorvastatin (LIPITOR) tablet 10 mg  10 mg Oral Daily Lonia Skinner, MD   10 mg at 02/11/12 1047  . baclofen (LIORESAL) tablet 10 mg  10 mg Oral BID Lonia Skinner, MD   10 mg at 02/11/12 1047  . chlorhexidine (PERIDEX) 0.12 % solution 15 mL  15 mL Mouth Rinse BID Alyson Reedy, MD   15 mL at 02/11/12 0808  . feeding supplement (PRO-STAT SUGAR FREE 64) liquid 30 mL  30 mL Per Tube TID WC Hettie Holstein, RD   30 mL at 02/11/12 1157  .  feeding supplement (PROMOTE) liquid 1,000 mL  1,000 mL Per Tube Q24H Hettie Holstein, RD   1,000 mL at 02/11/12 1523  . fentaNYL (SUBLIMAZE) injection 100 mcg  100 mcg Intravenous Q2H PRN Lonia Farber, MD   100 mcg at 02/08/12 0004  . gabapentin (NEURONTIN) capsule 300 mg  300 mg Oral QID Alyson Reedy, MD   300 mg at 02/11/12 1517  . heparin injection 5,000 Units  5,000 Units Subcutaneous Q8H Lonia Skinner, MD   5,000 Units at 02/11/12 1518  . hydrALAZINE (APRESOLINE) injection 10 mg  10 mg Intravenous Q4H PRN [name], MD      . hydrALAZINE (APRESOLINE) tablet 25 mg  25 mg Oral Q8H [name], MD   25 mg at 02/11/12 0545  . hydrochlorothiazide (MICROZIDE) capsule 12.5 mg  12.5 mg Oral Daily [name], MD   12.5 mg at 02/11/12 1047  . insulin aspart (novoLOG) injection 0-15 Units  0-15 Units Subcutaneous Q4H Leslye Peer, MD   3 Units at 02/11/12 1156  . insulin glargine (LANTUS) injection 10 Units  10 Units Subcutaneous Daily Coralyn Helling, MD   10 Units at  02/11/12 1049  . levothyroxine (SYNTHROID, LEVOTHROID) tablet 75 mcg  75 mcg Oral QAC breakfast Coralyn Helling, MD   75 mcg at 02/11/12 1047  . meropenem (MERREM) 500 mg in sodium chloride 0.9 % 50 mL IVPB  500 mg Intravenous Q12H Hilario Quarry Amend, PHARMD   500 mg at 02/11/12 0541  . pantoprazole sodium (PROTONIX) 40 mg/20 mL oral suspension 40 mg  40 mg Per Tube QHS Fayne Norrie, PHARMD   40 mg at 02/10/12 2215  . potassium chloride 20 MEQ/15ML (10%) liquid           . sodium chloride 0.9 % injection 3 mL  3 mL Intravenous Q12H Lonia Skinner, MD   3 mL at 02/11/12 1058  . valproate (DEPACON) 1,000 mg in dextrose 5 % 50 mL IVPB  1,000 mg Intravenous Q12H Thana Farr, MD      . vancomycin (VANCOCIN) 1,500 mg in sodium chloride 0.9 % 500 mL IVPB  1,500 mg Intravenous Q36H Meera Concha Se, PHARMD      . DISCONTD: imipenem-cilastatin (PRIMAXIN) 250 mg in sodium chloride 0.9 % 100 mL  IVPB  250 mg Intravenous Q6H [name], MD   250 mg at 02/10/12 1144  . DISCONTD: insulin glargine (LANTUS) injection 5 Units  5 Units Subcutaneous Daily [name], MD   5 Units at 02/10/12 1051  . DISCONTD: levothyroxine (SYNTHROID, LEVOTHROID) injection 26 mcg  26 mcg Intravenous Daily [name], MD   26 mcg at 02/10/12 1051  . DISCONTD: valproate (DEPACON) 500 mg in dextrose 5 % 50 mL IVPB  500 mg Intravenous Q12H Thana Farr, MD   500 mg at 02/11/12 0545  . DISCONTD: vancomycin (VANCOCIN) IVPB 1000 mg/200 mL premix  1,000 mg Intravenous Q24H [place], PHARMD   1,000 mg at 02/10/12 0809     PE: Gen: HEENT: CV: Res: Abd: Ext/Musc: Neuro:  Labs/Studies:  Basic Metabolic Panel:    Component Value Date/Time   NA 148* 02/11/2012 0415   K 3.6 02/11/2012 0415   CL 108 02/11/2012 0415   CO2 31 02/11/2012 0415   BUN 45* 02/11/2012 0415   CREATININE 2.04* 02/11/2012 0415   GLUCOSE 192* 02/11/2012 0415   CALCIUM 9.3 02/11/2012 0415   CBC:    Component Value Date/Time   WBC 21.1* 02/11/2012 0415   HGB 9.6* 02/11/2012 0415   HCT 29.0* 02/11/2012 0415   PLT 467* 02/11/2012 0415   MCV 82.9 02/11/2012 0415   NEUTROABS 14.3* 02/11/2012 0415   LYMPHSABS 3.6 02/11/2012 0415   MONOABS 3.2* 02/11/2012 0415   EOSABS 0.0 02/11/2012 0415   BASOSABS 0.0 02/11/2012 0415    Chest X-ray: Endotracheal tube terminates 4.5 cm above the carina. Pulmonary vascular congestion without frank interstitial edema, improved.   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 Currently on CCM service due to concern for sepsis. Was re-intubated on 5/18 because of unresponsiveness, decreased sensorium.   # sepsis in the setting of left posterior calcaneal osteomyelitis:  S/p  right AKA. Post op day 6 - WBC improving  - on vanc (start 5/14) and imipenem and cilastatin (start: 5/20)  - Wound care team  # acute encephalopathy:  CCM managing. Patient intubated and sedated for airway protection. Pt likely with metabolic encephalopathy, but follows commands. Neurology on board. Negative MRI, CT. Initial  EEG showing moderate encephalopathy of nonspecific etiology. Repeat EEG showed confirmed encephalopathy of non specific etiology. There is question as to whether there is ongoing seizure activity. Neuro recommends following and appreciate input.  - Increase Depakote to 1000mg  BID - Depakote level in am  # Peripheral vascular disease:  - continue to monitor left heal and foot  - follow up vascular surgery recommendations for potential revascularization of left leg in the future.   # Acute renal insufficiency: likely from hypovolemia and ATN. Stable at 2.04 today from 2.15 yesterday.   # Acute Respiratory failure: no plan for extubation until improvement of mental status.   # Diabetes:  CBG (last 3)   CBG (last 3)   Basename 02/11/12 1116 02/11/12 0750 02/11/12 0419  GLUCAP 182* 208* 184*  On NPH SSI - sliding scale coverage  - Adding Lantus 10units per CCM  # h/o spinal cord infarct:  - continue home meds for spasms and pain: baclofen 10bid, gabapentin 300 qid   # diastolic CHF grade 2 diastolic dysfunction with EF of 60-65% on May 2012 echo  Elevated BNP at 9763.   - Start ASA 81mg  - Hold on bblocker due to occasional bradycardia - continue current therapy w/ amlodipine, hydralazine, and HCTZ  # h/o gout: hold allopurinol. No ss of flare.  # h/o HTN: elevated BP:  - on amlodipine 5, hctz 12.5 and hydralazine prn   #h/o hypothyroid:  - continue home levothyroxine  - normal TSH   PPx:  - heparin sub q  PPI   Fen/Gi: npo, intubated  Dispo: currently on CCM's service. - Pt is FULL CODE   LOS 9  Signed: Maximillion Gill, MD Family Medicine  Resident PGY-1 [phone] 02/11/2012 3:31 PM

## 2012-02-11 NOTE — Progress Notes (Signed)
Family Medicine Teaching Service Attending Note  I discussed patient Alexandra Wiley  with Dr. Konrad Dolores and reviewed their note for today.  I agree with their assessment and plan.

## 2012-02-11 NOTE — Progress Notes (Signed)
INFECTIOUS DISEASE PROGRESS NOTE  ID: Alexandra Wiley is a 66 y.o. female with   Principal Problem:  *Osteomyelitis of right foot Active Problems:  Conus medullaris syndrome  Septic shock  Oliguria  Diabetes mellitus  Altered mental status  Acute respiratory failure  Subjective: transiently awake. Responds to commands  Abtx:  Anti-infectives     Start     Dose/Rate Route Frequency Ordered Stop   02/11/12 2000   vancomycin (VANCOCIN) 1,500 mg in sodium chloride 0.9 % 500 mL IVPB        1,500 mg 250 mL/hr over 120 Minutes Intravenous Every 36 hours 02/11/12 0911     02/10/12 1800   meropenem (MERREM) 500 mg in sodium chloride 0.9 % 50 mL IVPB        500 mg 100 mL/hr over 30 Minutes Intravenous Every 12 hours 02/10/12 1724     02/08/12 1200   imipenem-cilastatin (PRIMAXIN) 250 mg in sodium chloride 0.9 % 100 mL IVPB  Status:  Discontinued        250 mg 200 mL/hr over 30 Minutes Intravenous 4 times per day 02/08/12 1046 02/10/12 1647   02/07/12 1500   piperacillin-tazobactam (ZOSYN) IVPB 2.25 g  Status:  Discontinued        2.25 g 100 mL/hr over 30 Minutes Intravenous 3 times per day 02/07/12 1458 02/08/12 1046   02/07/12 0800   vancomycin (VANCOCIN) IVPB 1000 mg/200 mL premix  Status:  Discontinued        1,000 mg 200 mL/hr over 60 Minutes Intravenous Every 24 hours 02/07/12 1459 02/11/12 0840   02/03/12 2000   vancomycin (VANCOCIN) IVPB 1000 mg/200 mL premix  Status:  Discontinued        1,000 mg 200 mL/hr over 60 Minutes Intravenous Every 12 hours 02/03/12 0919 02/07/12 1459   02/03/12 1730   piperacillin-tazobactam (ZOSYN) IVPB 3.375 g  Status:  Discontinued        3.375 g 100 mL/hr over 30 Minutes Intravenous  Once 02/03/12 1723 02/03/12 1725   02/03/12 1730   vancomycin (VANCOCIN) IVPB 1000 mg/200 mL premix  Status:  Discontinued        1,000 mg 200 mL/hr over 60 Minutes Intravenous  Once 02/03/12 1723 02/03/12 1725   02/03/12 0100   piperacillin-tazobactam  (ZOSYN) IVPB 3.375 g  Status:  Discontinued        3.375 g 12.5 mL/hr over 240 Minutes Intravenous Every 8 hours 02/02/12 2233 02/07/12 1458   02/02/12 1700   vancomycin (VANCOCIN) 1,250 mg in sodium chloride 0.9 % 250 mL IVPB  Status:  Discontinued        1,250 mg 166.7 mL/hr over 90 Minutes Intravenous Every 12 hours 02/02/12 1613 02/03/12 0919   02/02/12 1545  piperacillin-tazobactam (ZOSYN) 3.375 g in dextrose 5 % 50 mL IVPB       3.375 g 100 mL/hr over 30 Minutes Intravenous  Once 02/02/12 1530 02/02/12 1937          Medications:  Scheduled:   . amLODipine  5 mg Oral Daily  . antiseptic oral rinse  15 mL Mouth Rinse QID  . aspirin  81 mg Oral Daily  . atorvastatin  10 mg Oral Daily  . baclofen  10 mg Oral BID  . chlorhexidine  15 mL Mouth Rinse BID  . feeding supplement  30 mL Per Tube TID WC  . feeding supplement (PROMOTE)  1,000 mL Per Tube Q24H  . gabapentin  300 mg Oral QID  .  heparin  5,000 Units Subcutaneous Q8H  . hydrALAZINE  25 mg Oral Q8H  . hydrochlorothiazide  12.5 mg Oral Daily  . insulin aspart  0-15 Units Subcutaneous Q4H  . insulin glargine  10 Units Subcutaneous Daily  . lactulose  30 g Oral Once  . levothyroxine  75 mcg Oral QAC breakfast  . meropenem (MERREM) IV  500 mg Intravenous Q12H  . pantoprazole sodium  40 mg Per Tube QHS  . potassium chloride  40 mEq Per Tube Once  . potassium chloride      . sodium chloride  3 mL Intravenous Q12H  . valproate sodium  500 mg Intravenous Q12H  . vancomycin  1,500 mg Intravenous Q36H  . DISCONTD: hydrocortisone sodium succinate  50 mg Intravenous Q8H  . DISCONTD: imipenem-cilastatin  250 mg Intravenous Q6H  . DISCONTD: insulin aspart protamine-insulin aspart  12 Units Subcutaneous Q supper  . DISCONTD: insulin glargine  5 Units Subcutaneous Daily  . DISCONTD: levothyroxine  26 mcg Intravenous Daily  . DISCONTD: levothyroxine  38 mcg Intravenous Daily  . DISCONTD: pantoprazole (PROTONIX) IV  40 mg  Intravenous QHS  . DISCONTD: vancomycin  1,000 mg Intravenous Q24H    Objective: Vital signs in last 24 hours: Temp:  [97.9 F (36.6 C)-99.7 F (37.6 C)] 97.9 F (36.6 C) (05/23 0756) Pulse Rate:  [76-130] 116  (05/23 0859) Resp:  [15-33] 24  (05/23 0859) BP: (116-197)/(53-83) 149/59 mmHg (05/23 0859) SpO2:  [97 %-100 %] 100 % (05/23 0859) FiO2 (%):  [29.8 %-40 %] 40 % (05/23 0859) Weight:  [76.2 kg (167 lb 15.9 oz)] 76.2 kg (167 lb 15.9 oz) (05/23 0400)   General appearance: mild distress Resp: rhonchi bilaterally and mild Cardio: regular rate and rhythm GI: normal findings: bowel sounds normal and soft, non-tender Extremities: LLE wound is clean, no discharge.   Lab Results  Basename 02/11/12 0415 02/10/12 0355  WBC 21.1* 19.7*  HGB 9.6* 8.9*  HCT 29.0* 27.2*  NA 148* 140  K 3.6 3.6  CL 108 103  CO2 31 28  BUN 45* 32*  CREATININE 2.04* 2.15*  GLU -- --   Liver Panel  Basename 02/10/12 0355 02/09/12 0600  PROT 6.0 5.9*  ALBUMIN 2.4* 2.3*  AST 17 17  ALT 13 16  ALKPHOS 90 71  BILITOT 0.2* 0.2*  BILIDIR -- --  IBILI -- --   Sedimentation Rate No results found for this basename: ESRSEDRATE in the last 72 hours C-Reactive Protein No results found for this basename: CRP:2 in the last 72 hours  Microbiology: Recent Results (from the past 240 hour(s))  MRSA PCR SCREENING     Status: Normal   Collection Time   02/03/12  6:10 PM      Component Value Range Status Comment   MRSA by PCR NEGATIVE  NEGATIVE  Final   CULTURE, BLOOD (ROUTINE X 2)     Status: Normal   Collection Time   02/03/12  6:15 PM      Component Value Range Status Comment   Specimen Description BLOOD CENTRAL LINE   Final    Special Requests     Final    Value: BOTTLES DRAWN AEROBIC AND ANAEROBIC 10CC LEFT IJ CVC   Culture  Setup Time [id number]   Final    Culture NO GROWTH 5 DAYS   Final    Report Status 02/10/2012 FINAL   Final   URINE CULTURE     Status: Normal   Collection Time  02/03/12  6:20 PM      Component Value Range Status Comment   Specimen Description URINE, CATHETERIZED   Final    Special Requests NONE   Final    Culture  Setup Time [id number]   Final    Colony Count NO GROWTH   Final    Culture NO GROWTH   Final    Report Status 02/05/2012 FINAL   Final   CULTURE, BLOOD (ROUTINE X 2)     Status: Normal   Collection Time   02/03/12  7:58 PM      Component Value Range Status Comment   Specimen Description BLOOD HAND RIGHT   Final    Special Requests BOTTLES DRAWN AEROBIC ONLY 3CC   Final    Culture  Setup Time [id number]   Final    Culture NO GROWTH 5 DAYS   Final    Report Status 02/10/2012 FINAL   Final   CULTURE, BLOOD (ROUTINE X 2)     Status: Normal (Preliminary result)   Collection Time   02/07/12 11:42 AM      Component Value Range Status Comment   Specimen Description BLOOD RIGHT HAND   Final    Special Requests BOTTLES DRAWN AEROBIC ONLY 3CC   Final    Culture  Setup Time [id number]   Final    Culture     Final    Value:        BLOOD CULTURE RECEIVED NO GROWTH TO DATE CULTURE WILL BE HELD FOR 5 DAYS BEFORE ISSUING A FINAL NEGATIVE REPORT   Report Status PENDING   Incomplete   CULTURE, BLOOD (ROUTINE X 2)     Status: Normal (Preliminary result)   Collection Time   02/07/12  1:37 PM      Component Value Range Status Comment   Specimen Description BLOOD LEFT HAND   Final    Special Requests BOTTLES DRAWN AEROBIC AND ANAEROBIC 3CC   Final    Culture  Setup Time [id number]   Final    Culture     Final    Value:        BLOOD CULTURE RECEIVED NO GROWTH TO DATE CULTURE WILL BE HELD FOR 5 DAYS BEFORE ISSUING A FINAL NEGATIVE REPORT   Report Status PENDING   Incomplete   CULTURE, RESPIRATORY     Status: Normal   Collection Time   02/07/12  2:22 PM      Component Value Range Status Comment   Specimen Description TRACHEAL ASPIRATE   Final    Special Requests NONE   Final    Gram Stain     Final    Value: FEW WBC PRESENT, PREDOMINANTLY PMN      NO SQUAMOUS EPITHELIAL CELLS SEEN     NO ORGANISMS SEEN   Culture Non-Pathogenic Oropharyngeal-type Flora Isolated.   Final    Report Status 02/10/2012 FINAL   Final     Studies/Results: US Renal Port  02/09/2012  *RADIOLOGY REPORT*  Clinical Data: Elevated creatinine, evaluate for hydronephrosis  RENAL/URINARY TRACT ULTRASOUND COMPLETE  Comparison:  CT abdomen pelvis dated 02/08/2012  Findings:  Evaluation is mildly constrained due to portable technique.  Right Kidney:  Measures 11.6 cm.  No mass or hydronephrosis.  Left Kidney:  Measures 11.2 cm.  No mass or hydronephrosis.  Bladder:  Nonvisualized secondary to indwelling Foley catheter.  IMPRESSION: No hydronephrosis.  Original Report Authenticated By: Charline Bills, M.D.   Dg Chest Port 1 View  02/11/2012  *RADIOLOGY REPORT*  Clinical Data: Check ET tube position  PORTABLE CHEST - 1 VIEW  Comparison: 02/10/2012  Findings: Endotracheal tube terminates 4.5 cm above the carina. Weighted feeding tube terminates in the stomach.  Pulmonary vascular congestion without frank interstitial edema, improved.  Bibasilar opacities, likely atelectasis.  No pneumothorax.  The heart is normal in size.  IMPRESSION: Endotracheal tube terminates 4.5 cm above the carina.  Pulmonary vascular congestion without frank interstitial edema, improved.  Original Report Authenticated By: Charline Bills, M.D.   Dg Chest Port 1 View  02/10/2012  *RADIOLOGY REPORT*  Clinical Data: Evaluate endotracheal tube placement.  PORTABLE CHEST - 1 VIEW  Comparison: Chest x-ray 02/09/2012.  Findings: Compared to the prior examination there has been interval advancement of the endotracheal tube which is now only 7 mm above the carina. A feeding tube is seen extending into the abdomen, tip within the body of the stomach.  Lung volumes are low.  There are persistent bibasilar opacities which may represent areas of atelectasis and/or consolidation, with superimposed small bilateral  pleural effusions.  Overall, aeration has slightly improved.  No evidence of pulmonary edema.  Heart size is mildly enlarged.  The patient is rotated to the right on today's exam, resulting in distortion of the mediastinal contours and reduced diagnostic sensitivity and specificity for mediastinal pathology. Atherosclerotic calcifications within the arch of the aorta.  IMPRESSION: 1.  Low-lying endotracheal tube.  Consider withdrawal 3-4 cm for more optimal placement. 2.  Slight improvement in aeration, however, there continues to be extensive areas of bibasilar atelectasis and/or consolidation with superimposed small bilateral pleural effusions. 3.  Atherosclerosis.  These results were called by telephone on 02/10/2012 at 09:50 a.m. to nurse Lauren in the ICU, who verbally acknowledged these results.  Original Report Authenticated By: Florencia Reasons, M.D.     Assessment/Plan: Sepsis/SIRS  Day 10 Anbx (Merrem/vanco)  MRI with ? Of myositis in thighs  MRI/A does not show significant ischemia- has slow distal flow on L. Will defer to surgery.  BCx and resp Cx are negative (5-15, 5-19)  WBC up slightly, Cr better.  Central line changed to peripheral  Renal u/s no hydro (as f/u to her CT scan).  My appreciation to pharmacy for assitance with Merrem dosing.  Will check C diff (although her bowel problems are chronic since her stroke per family).  Family updated.    Johny Sax Infectious Diseases [phone] 02/11/2012, 9:45 AM   LOS: 9 days

## 2012-02-11 NOTE — Progress Notes (Signed)
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  No Known Allergies  Patient Measurements: Height: 4\' 11"  (149.9 cm) Weight: 167 lb 15.9 oz (76.2 kg) IBW/kg (Calculated) : 43.2   Vital Signs: Temp: 97.9 F (36.6 C) (05/23 0756) Temp src: Oral (05/23 0756) BP: 133/53 mmHg (05/23 0800) Pulse Rate: 89  (05/23 0800) Intake/Output from previous day: 05/22 0701 - 05/23 0700 In: 1893 [I.V.:210; NG/GT:1170; IV Piggyback:510] Out: 1990 [Urine:1990] Intake/Output from this shift:    Labs:  Basename 02/11/12 0415 02/10/12 0355 02/09/12 1752 02/09/12 1238  WBC 21.1* 19.7* -- 22.1*  HGB 9.6* 8.9* -- 8.7*  PLT 467* 427* -- 431*  LABCREA -- -- -- --  CREATININE 2.04* 2.15* 2.11* --   Estimated Creatinine Clearance: 23.5 ml/min (by C-G formula based on Cr of 2.04).  Basename 02/11/12 0730  VANCOTROUGH 29.8*  VANCOPEAK --  Drue Dun --  GENTTROUGH --  GENTPEAK --  GENTRANDOM --  TOBRATROUGH --  TOBRAPEAK --  TOBRARND --  AMIKACINPEAK --  AMIKACINTROU --  AMIKACIN --     Microbiology: Recent Results (from the past 720 hour(s))  MRSA PCR SCREENING     Status: Normal   Collection Time   02/03/12  6:10 PM      Component Value Range Status Comment   MRSA by PCR NEGATIVE  NEGATIVE  Final   CULTURE, BLOOD (ROUTINE X 2)     Status: Normal   Collection Time   02/03/12  6:15 PM      Component Value Range Status Comment   Specimen Description BLOOD CENTRAL LINE   Final    Special Requests     Final    Value: BOTTLES DRAWN AEROBIC AND ANAEROBIC 10CC LEFT IJ CVC   Culture  Setup Time [id number]   Final    Culture NO GROWTH 5 DAYS   Final    Report Status 02/10/2012 FINAL   Final   URINE CULTURE     Status: Normal   Collection Time   02/03/12  6:20 PM      Component Value Range Status Comment   Specimen Description URINE, CATHETERIZED   Final    Special Requests NONE   Final    Culture  Setup  Time [id number]   Final    Colony Count NO GROWTH   Final    Culture NO GROWTH   Final    Report Status 02/05/2012 FINAL   Final   CULTURE, BLOOD (ROUTINE X 2)     Status: Normal   Collection Time   02/03/12  7:58 PM      Component Value Range Status Comment   Specimen Description BLOOD HAND RIGHT   Final    Special Requests BOTTLES DRAWN AEROBIC ONLY [facility]   Final    Culture  Setup Time [id number]   Final    Culture NO GROWTH 5 DAYS   Final    Report Status 02/10/2012 FINAL   Final   CULTURE, BLOOD (ROUTINE X 2)     Status: Normal (Preliminary result)   Collection Time   02/07/12 11:42 AM      Component Value Range Status Comment   Specimen Description BLOOD RIGHT HAND   Final    Special Requests BOTTLES DRAWN AEROBIC ONLY [facility]   Final    Culture  Setup Time [id number]   Final    Culture     Final    Value:  BLOOD CULTURE RECEIVED NO GROWTH TO DATE CULTURE WILL BE HELD FOR 5 DAYS BEFORE ISSUING A FINAL NEGATIVE REPORT   Report Status PENDING   Incomplete   CULTURE, BLOOD (ROUTINE X 2)     Status: Normal (Preliminary result)   Collection Time   02/07/12  1:37 PM      Component Value Range Status Comment   Specimen Description BLOOD LEFT HAND   Final    Special Requests BOTTLES DRAWN AEROBIC AND ANAEROBIC 3CC   Final    Culture  Setup Time [id number]   Final    Culture     Final    Value:        BLOOD CULTURE RECEIVED NO GROWTH TO DATE CULTURE WILL BE HELD FOR 5 DAYS BEFORE ISSUING A FINAL NEGATIVE REPORT   Report Status PENDING   Incomplete   CULTURE, RESPIRATORY     Status: Normal   Collection Time   02/07/12  2:22 PM      Component Value Range Status Comment   Specimen Description TRACHEAL ASPIRATE   Final    Special Requests NONE   Final    Gram Stain     Final    Value: FEW WBC PRESENT, PREDOMINANTLY PMN     NO SQUAMOUS EPITHELIAL CELLS SEEN     NO ORGANISMS SEEN   Culture Non-Pathogenic Oropharyngeal-type Flora Isolated.   Final    Report Status 02/10/2012  FINAL   Final     Assessment: 66 y.o. 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   Imipenem changed to Meropenem due to possible subclinical seizure activity.   Pt with CrCl ~30 ml/min. UOP 1.1 ml/kg/hr, good. Vancomycin trough elevated for goal-suspect accumulation was d/t recent SCr elevation, noted Scr has been decreasing over the past few days.  While it is unreliable to estimate PK parameters on changing renal function, based on current renal status, ke is ~ 0.03, yielding a t1/2 of 23hrs. Likely needs dosing interval of 36h, and slightly higher dose (1500mg ). This will attain a peak of ~ 40 and trough of 14 mcg/ml.  Abx:  5/14: Vanc >> 5/14: Zosyn >> 5/20 5/20: Imipenem >>5/22 5/22: Merrem >>  Cx:  5/19 Resp- Normal flora 5/19 Blood -ngtd 5/15: MRSA PCR: neg 5/15: Blood x2: neg 5/15: Urine: neg  Goal of Therapy:  Vanc trough 15-20 mcg/ml  Plan:  - D/c current Vanc dose, will restart 1500mg  IV q 36h at 8pm today. - Plan to recheck Vanc trough at Css; if renal function continues to improve, may consider changing dose/interval - Continue Meropenem 500 mg IV q12h.  - F/u renal function and microbiological data. - f/up decision for abx LOT.  Javanna Patin K. Allena Katz, PharmD, BCPS.  Clinical Pharmacist Pager [phone]. 02/11/2012 8:51 AM

## 2012-02-11 NOTE — Progress Notes (Signed)
Patient ID: Alexandra Wiley, female   DOB: June 20, 1946, 66 y.o.   MRN: [id number] Right AKA healing.  Left heel stable with partial thickness loss. Intubated and sedated

## 2012-02-11 NOTE — Progress Notes (Signed)
Subjective: Patient remains poorly responsive.  Depakote started and seems to be tolerating well.  Level this AM less than 10.  Objective: Current vital signs: BP 137/64  Pulse 109  Temp(Src) 97.9 F (36.6 C) (Oral)  Resp 21  Ht 4\' 11"  (1.499 m)  Wt 76.2 kg (167 lb 15.9 oz)  BMI 33.93 kg/m2  SpO2 100% Vital signs in last 24 hours: Temp:  [97.9 F (36.6 C)-99.7 F (37.6 C)] 97.9 F (36.6 C) (05/23 0756) Pulse Rate:  [76-139] 109  (05/23 1300) Resp:  [15-24] 21  (05/23 1300) BP: (116-188)/(53-101) 137/64 mmHg (05/23 1300) SpO2:  [97 %-100 %] 100 % (05/23 1300) FiO2 (%):  [29.9 %-40.1 %] 40 % (05/23 1300) Weight:  [76.2 kg (167 lb 15.9 oz)] 76.2 kg (167 lb 15.9 oz) (05/23 0400)  Intake/Output from previous day: 05/22 0701 - 05/23 0700 In: 1903 [I.V.:220; NG/GT:1170; IV Piggyback:510] Out: 1990 [Urine:1990] Intake/Output this shift: Total I/O In: 465 [I.V.:60; NG/GT:405] Out: 575 [Urine:575] Nutritional status: NPO  Neurologic Exam: Mental Status: Lethargic.  Follows simple commands. Cranial Nerves: II: Pupils reactive III,IV, VI: ptosis not present, extra-ocular motions intact bilaterally V,VII: Moves face symmetrically VIII: Unable to test IX,X: Unable to test XI: Unable to test XII: tongue strength normal  Motor: Moves all extremities weakly and does not lift off the bed.  Able to grip my hands  Bilaterally to command Tone and bulk:normal tone throughout; no atrophy noted Plantars: Right: AKA   Left: mute  Lab Results: Results for orders placed during the hospital encounter of 02/02/12 (from the past 48 hour(s))  GLUCOSE, CAPILLARY     Status: Abnormal   Collection Time   02/09/12  4:17 PM      Component Value Range Comment   Glucose-Capillary 147 (*) 70 - 99 (mg/dL)   BASIC METABOLIC PANEL     Status: Abnormal   Collection Time   02/09/12  5:52 PM      Component Value Range Comment   Sodium 142  135 - 145 (mEq/L)    Potassium 3.2 (*) 3.5 - 5.1 (mEq/L)      Chloride 104  96 - 112 (mEq/L)    CO2 28  19 - 32 (mEq/L)    Glucose, Bld 164 (*) 70 - 99 (mg/dL)    BUN 24 (*) 6 - 23 (mg/dL)    Creatinine, Ser 1.61 (*) 0.50 - 1.10 (mg/dL)    Calcium 8.8  8.4 - 10.5 (mg/dL)    GFR calc non Af Amer 23 (*) >90 (mL/min)    GFR calc Af Amer 27 (*) >90 (mL/min)   GLUCOSE, CAPILLARY     Status: Abnormal   Collection Time   02/09/12  8:05 PM      Component Value Range Comment   Glucose-Capillary 173 (*) 70 - 99 (mg/dL)   GLUCOSE, CAPILLARY     Status: Abnormal   Collection Time   02/10/12 12:04 AM      Component Value Range Comment   Glucose-Capillary 183 (*) 70 - 99 (mg/dL)    Comment 1 Notify RN     COMPREHENSIVE METABOLIC PANEL     Status: Abnormal   Collection Time   02/10/12  3:55 AM      Component Value Range Comment   Sodium 140  135 - 145 (mEq/L)    Potassium 3.6  3.5 - 5.1 (mEq/L)    Chloride 103  96 - 112 (mEq/L)    CO2 28  19 - 32 (mEq/L)  Glucose, Bld 234 (*) 70 - 99 (mg/dL)    BUN 32 (*) 6 - 23 (mg/dL)    Creatinine, Ser 9.60 (*) 0.50 - 1.10 (mg/dL)    Calcium 8.8  8.4 - 10.5 (mg/dL)    Total Protein 6.0  6.0 - 8.3 (g/dL)    Albumin 2.4 (*) 3.5 - 5.2 (g/dL)    AST 17  0 - 37 (U/L)    ALT 13  0 - 35 (U/L)    Alkaline Phosphatase 90  39 - 117 (U/L)    Total Bilirubin 0.2 (*) 0.3 - 1.2 (mg/dL)    GFR calc non Af Amer 22 (*) >90 (mL/min)    GFR calc Af Amer 26 (*) >90 (mL/min)   CBC     Status: Abnormal   Collection Time   02/10/12  3:55 AM      Component Value Range Comment   WBC 19.7 (*) 4.0 - 10.5 (K/uL)    RBC 3.36 (*) 3.87 - 5.11 (MIL/uL)    Hemoglobin 8.9 (*) 12.0 - 15.0 (g/dL)    HCT 45.4 (*) 09.8 - 46.0 (%)    MCV 81.0  78.0 - 100.0 (fL)    MCH 26.5  26.0 - 34.0 (pg)    MCHC 32.7  30.0 - 36.0 (g/dL)    RDW 11.9  14.7 - 82.9 (%)    Platelets 427 (*) 150 - 400 (K/uL)   GLUCOSE, CAPILLARY     Status: Abnormal   Collection Time   02/10/12  4:24 AM      Component Value Range Comment   Glucose-Capillary 226 (*) 70 - 99  (mg/dL)    Comment 1 Notify RN     GLUCOSE, CAPILLARY     Status: Abnormal   Collection Time   02/10/12  7:38 AM      Component Value Range Comment   Glucose-Capillary 215 (*) 70 - 99 (mg/dL)   GLUCOSE, CAPILLARY     Status: Abnormal   Collection Time   02/10/12 11:05 AM      Component Value Range Comment   Glucose-Capillary 229 (*) 70 - 99 (mg/dL)   AMMONIA     Status: Normal   Collection Time   02/10/12 11:14 AM      Component Value Range Comment   Ammonia 48  11 - 60 (umol/L)   GLUCOSE, CAPILLARY     Status: Abnormal   Collection Time   02/10/12  3:14 PM      Component Value Range Comment   Glucose-Capillary 200 (*) 70 - 99 (mg/dL)   GLUCOSE, CAPILLARY     Status: Abnormal   Collection Time   02/10/12  8:56 PM      Component Value Range Comment   Glucose-Capillary 223 (*) 70 - 99 (mg/dL)   GLUCOSE, CAPILLARY     Status: Abnormal   Collection Time   02/11/12 12:11 AM      Component Value Range Comment   Glucose-Capillary 200 (*) 70 - 99 (mg/dL)    Comment 1 Documented in Chart      Comment 2 Notify RN     BASIC METABOLIC PANEL     Status: Abnormal   Collection Time   02/11/12  4:15 AM      Component Value Range Comment   Sodium 148 (*) 135 - 145 (mEq/L) DELTA CHECK NOTED   Potassium 3.6  3.5 - 5.1 (mEq/L)    Chloride 108  96 - 112 (mEq/L)    CO2 31  19 - 32 (mEq/L)    Glucose, Bld 192 (*) 70 - 99 (mg/dL)    BUN 45 (*) 6 - 23 (mg/dL)    Creatinine, Ser 1.61 (*) 0.50 - 1.10 (mg/dL)    Calcium 9.3  8.4 - 10.5 (mg/dL)    GFR calc non Af Amer 24 (*) >90 (mL/min)    GFR calc Af Amer 28 (*) >90 (mL/min)   CBC     Status: Abnormal   Collection Time   02/11/12  4:15 AM      Component Value Range Comment   WBC 21.1 (*) 4.0 - 10.5 (K/uL)    RBC 3.50 (*) 3.87 - 5.11 (MIL/uL)    Hemoglobin 9.6 (*) 12.0 - 15.0 (g/dL)    HCT 09.6 (*) 04.5 - 46.0 (%)    MCV 82.9  78.0 - 100.0 (fL)    MCH 27.4  26.0 - 34.0 (pg)    MCHC 33.1  30.0 - 36.0 (g/dL)    RDW 40.9  81.1 - 91.4 (%)     Platelets 467 (*) 150 - 400 (K/uL)   DIFFERENTIAL     Status: Abnormal   Collection Time   02/11/12  4:15 AM      Component Value Range Comment   Neutrophils Relative 68  43 - 77 (%)    Lymphocytes Relative 17  12 - 46 (%)    Monocytes Relative 15 (*) 3 - 12 (%)    Eosinophils Relative 0  0 - 5 (%)    Basophils Relative 0  0 - 1 (%)    Neutro Abs 14.3 (*) 1.7 - 7.7 (K/uL)    Lymphs Abs 3.6  0.7 - 4.0 (K/uL)    Monocytes Absolute 3.2 (*) 0.1 - 1.0 (K/uL)    Eosinophils Absolute 0.0  0.0 - 0.7 (K/uL)    Basophils Absolute 0.0  0.0 - 0.1 (K/uL)    RBC Morphology POLYCHROMASIA PRESENT   TARGET CELLS   Smear Review PLATELETS APPEAR ADEQUATE   LARGE PLATELETS PRESENT  VALPROIC ACID LEVEL     Status: Abnormal   Collection Time   02/11/12  4:15 AM      Component Value Range Comment   Valproic Acid Lvl <10.0 (*) 50.0 - 100.0 (ug/mL)   GLUCOSE, CAPILLARY     Status: Abnormal   Collection Time   02/11/12  4:19 AM      Component Value Range Comment   Glucose-Capillary 184 (*) 70 - 99 (mg/dL)    Comment 1 Documented in Chart      Comment 2 Notify RN     VANCOMYCIN, [place]     Status: Abnormal   Collection Time   02/11/12  7:30 AM      Component Value Range Comment   Vancomycin Tr 29.8 (*) 10.0 - 20.0 (ug/mL)   GLUCOSE, CAPILLARY     Status: Abnormal   Collection Time   02/11/12  7:50 AM      Component Value Range Comment   Glucose-Capillary 208 (*) 70 - 99 (mg/dL)   GLUCOSE, CAPILLARY     Status: Abnormal   Collection Time   02/11/12 11:16 AM      Component Value Range Comment   Glucose-Capillary 182 (*) 70 - 99 (mg/dL)     Recent Results (from the past 240 hour(s))  MRSA PCR SCREENING     Status: Normal   Collection Time   02/03/12  6:10 PM      Component Value Range Status Comment  MRSA by PCR NEGATIVE  NEGATIVE  Final   CULTURE, BLOOD (ROUTINE X 2)     Status: Normal   Collection Time   02/03/12  6:15 PM      Component Value Range Status Comment   Specimen Description BLOOD  CENTRAL LINE   Final    Special Requests     Final    Value: BOTTLES DRAWN AEROBIC AND ANAEROBIC 10CC LEFT IJ CVC   Culture  Setup Time [id number]   Final    Culture NO GROWTH 5 DAYS   Final    Report Status 02/10/2012 FINAL   Final   URINE CULTURE     Status: Normal   Collection Time   02/03/12  6:20 PM      Component Value Range Status Comment   Specimen Description URINE, CATHETERIZED   Final    Special Requests NONE   Final    Culture  Setup Time [id number]   Final    Colony Count NO GROWTH   Final    Culture NO GROWTH   Final    Report Status 02/05/2012 FINAL   Final   CULTURE, BLOOD (ROUTINE X 2)     Status: Normal   Collection Time   02/03/12  7:58 PM      Component Value Range Status Comment   Specimen Description BLOOD HAND RIGHT   Final    Special Requests BOTTLES DRAWN AEROBIC ONLY 3CC   Final    Culture  Setup Time [id number]   Final    Culture NO GROWTH 5 DAYS   Final    Report Status 02/10/2012 FINAL   Final   CULTURE, BLOOD (ROUTINE X 2)     Status: Normal (Preliminary result)   Collection Time   02/07/12 11:42 AM      Component Value Range Status Comment   Specimen Description BLOOD RIGHT HAND   Final    Special Requests BOTTLES DRAWN AEROBIC ONLY 3CC   Final    Culture  Setup Time [id number]   Final    Culture     Final    Value:        BLOOD CULTURE RECEIVED NO GROWTH TO DATE CULTURE WILL BE HELD FOR 5 DAYS BEFORE ISSUING A FINAL NEGATIVE REPORT   Report Status PENDING   Incomplete   CULTURE, BLOOD (ROUTINE X 2)     Status: Normal (Preliminary result)   Collection Time   02/07/12  1:37 PM      Component Value Range Status Comment   Specimen Description BLOOD LEFT HAND   Final    Special Requests BOTTLES DRAWN AEROBIC AND ANAEROBIC 3CC   Final    Culture  Setup Time [id number]   Final    Culture     Final    Value:        BLOOD CULTURE RECEIVED NO GROWTH TO DATE CULTURE WILL BE HELD FOR 5 DAYS BEFORE ISSUING A FINAL NEGATIVE REPORT   Report Status  PENDING   Incomplete   CULTURE, RESPIRATORY     Status: Normal   Collection Time   02/07/12  2:22 PM      Component Value Range Status Comment   Specimen Description TRACHEAL ASPIRATE   Final    Special Requests NONE   Final    Gram Stain     Final    Value: FEW WBC PRESENT, PREDOMINANTLY PMN     NO SQUAMOUS EPITHELIAL CELLS SEEN     NO ORGANISMS SEEN  Culture Non-Pathogenic Oropharyngeal-type Flora Isolated.   Final    Report Status 02/10/2012 FINAL   Final     Studies/Results: US Renal Port  02/09/2012  *RADIOLOGY REPORT*  Clinical Data: Elevated creatinine, evaluate for hydronephrosis  RENAL/URINARY TRACT ULTRASOUND COMPLETE  Comparison:  CT abdomen pelvis dated 02/08/2012  Findings:  Evaluation is mildly constrained due to portable technique.  Right Kidney:  Measures 11.6 cm.  No mass or hydronephrosis.  Left Kidney:  Measures 11.2 cm.  No mass or hydronephrosis.  Bladder:  Nonvisualized secondary to indwelling Foley catheter.  IMPRESSION: No hydronephrosis.  Original Report Authenticated By: Charline Bills, M.D.   Dg Chest Port 1 View  02/11/2012  *RADIOLOGY REPORT*  Clinical Data: Check ET tube position  PORTABLE CHEST - 1 VIEW  Comparison: 02/10/2012  Findings: Endotracheal tube terminates 4.5 cm above the carina. Weighted feeding tube terminates in the stomach.  Pulmonary vascular congestion without frank interstitial edema, improved.  Bibasilar opacities, likely atelectasis.  No pneumothorax.  The heart is normal in size.  IMPRESSION: Endotracheal tube terminates 4.5 cm above the carina.  Pulmonary vascular congestion without frank interstitial edema, improved.  Original Report Authenticated By: Charline Bills, M.D.   Dg Chest Port 1 View  02/10/2012  *RADIOLOGY REPORT*  Clinical Data: Evaluate endotracheal tube placement.  PORTABLE CHEST - 1 VIEW  Comparison: Chest x-ray 02/09/2012.  Findings: Compared to the prior examination there has been interval advancement of the  endotracheal tube which is now only 7 mm above the carina. A feeding tube is seen extending into the abdomen, tip within the body of the stomach.  Lung volumes are low.  There are persistent bibasilar opacities which may represent areas of atelectasis and/or consolidation, with superimposed small bilateral pleural effusions.  Overall, aeration has slightly improved.  No evidence of pulmonary edema.  Heart size is mildly enlarged.  The patient is rotated to the right on today's exam, resulting in distortion of the mediastinal contours and reduced diagnostic sensitivity and specificity for mediastinal pathology. Atherosclerotic calcifications within the arch of the aorta.  IMPRESSION: 1.  Low-lying endotracheal tube.  Consider withdrawal 3-4 cm for more optimal placement. 2.  Slight improvement in aeration, however, there continues to be extensive areas of bibasilar atelectasis and/or consolidation with superimposed small bilateral pleural effusions. 3.  Atherosclerosis.  These results were called by telephone on 02/10/2012 at 09:50 a.m. to nurse Lauren in the ICU, who verbally acknowledged these results.  Original Report Authenticated By: Florencia Reasons, M.D.    Medications:  I have reviewed the patient's current medications. Scheduled:   . amLODipine  5 mg Oral Daily  . antiseptic oral rinse  15 mL Mouth Rinse QID  . aspirin  81 mg Oral Daily  . atorvastatin  10 mg Oral Daily  . baclofen  10 mg Oral BID  . chlorhexidine  15 mL Mouth Rinse BID  . feeding supplement  30 mL Per Tube TID WC  . feeding supplement (PROMOTE)  1,000 mL Per Tube Q24H  . gabapentin  300 mg Oral QID  . heparin  5,000 Units Subcutaneous Q8H  . hydrALAZINE  25 mg Oral Q8H  . hydrochlorothiazide  12.5 mg Oral Daily  . insulin aspart  0-15 Units Subcutaneous Q4H  . insulin glargine  10 Units Subcutaneous Daily  . levothyroxine  75 mcg Oral QAC breakfast  . meropenem (MERREM) IV  500 mg Intravenous Q12H  . pantoprazole  sodium  40 mg Per Tube QHS  . potassium  chloride      . sodium chloride  3 mL Intravenous Q12H  . valproate sodium  500 mg Intravenous Q12H  . vancomycin  1,500 mg Intravenous Q36H  . DISCONTD: imipenem-cilastatin  250 mg Intravenous Q6H  . DISCONTD: insulin glargine  5 Units Subcutaneous Daily  . DISCONTD: levothyroxine  26 mcg Intravenous Daily  . DISCONTD: vancomycin  1,000 mg Intravenous Q24H    Assessment/Plan:  Patient Active Hospital Problem List:  Altered mental status (02/03/2012)   Assessment: Depakote started and MS none improved but level is subtherapeutic.     Plan:  1.  Increase Depakote to 1000mg  BID  2.  Depakote level in AM  3.  Will continue to follow with you   LOS: 9 days   Thana Farr, MD [facility] [phone] 02/11/2012  1:23 PM

## 2012-02-11 NOTE — Progress Notes (Signed)
Name: Alexandra Wiley MRN: [id number] DOB: 04-Oct-1945    LOS: 9  [facility] / Critical Care Note   History of Present Illness:  66 yo female smoker admitted by FPTS on 02/02/2012 non-healing ulcer 1st Rt toe and heel.  Seen by VVS and felt to have non-viable Rt lower extremity.  Transferred to ICU 5/16 for low BP, acute encephalopathy, possible sepsis, and PCCM consulted.  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  Intubated 5/18 due to altered mental status from hyperkalemia, acidosis, and shock.  Neurology consulted 5/18.  ID consulted 5/20. PMHx CVA, Paraplegia form spinal cord infarct, DM, CAD, HTN, Gout, PVD, Hypothyroidism  Tests / Events: 5/20- slow improvement neurostatus 5/20 imipenem started 5/22 Add depacon for possible seizures  Subjective: Follows simple commands.  Tolerates some pressure support.  Vital Signs: Temp:  [97.9 F (36.6 C)-99.7 F (37.6 C)] 97.9 F (36.6 C) (05/23 0756) Pulse Rate:  [76-130] 89  (05/23 0700) Resp:  [15-33] 20  (05/23 0700) BP: (116-197)/(50-83) 138/58 mmHg (05/23 0700) SpO2:  [97 %-100 %] 100 % (05/23 0700) FiO2 (%):  [29.8 %-40 %] 40 % (05/23 0700) Weight:  [167 lb 15.9 oz (76.2 kg)] 167 lb 15.9 oz (76.2 kg) (05/23 0400) I/O last 3 completed shifts: In: 2683 [I.V.:330; Other:3; NG/GT:1630; IV Piggyback:720] Out: 3520 [Urine:3520]  Physical Examination: General - no distress HEENT - ETT in place Cardiac - s1s2 regular Chest - scattered rhonchi Abd - soft, + bowel sounds Ext - Rt AKA stump wound clean Neuro - somnolent, arousable, follows simple commands   CBC  Lab 02/11/12 0415 02/10/12 0355 02/09/12 1238  HGB 9.6* 8.9* 8.7*  HCT 29.0* 27.2* 26.0*  WBC 21.1* 19.7* 22.1*  PLT 467* 427* 431*   BMET  Lab 02/11/12 0415 02/10/12 0355 02/09/12 1752 02/09/12 0600 02/08/12 0425  NA 148* 140 142 141 142  K 3.6 3.6 -- -- --  CL 108 103 104 104 111  CO2 31 28 28 24 21   GLUCOSE 192* 234* 164* 150* 131*  BUN 45* 32* 24* 22 17  CREATININE 2.04*  2.15* 2.11* 2.25* 2.35*  CALCIUM 9.3 8.8 8.8 8.5 7.9*  MG -- -- -- -- --  PHOS -- -- -- -- --    Lab Results  Component Value Date   ALT 13 02/10/2012   AST 17 02/10/2012   ALKPHOS 90 02/10/2012   BILITOT 0.2* 02/10/2012     US Renal Port  02/09/2012  *RADIOLOGY REPORT*  Clinical Data: Elevated creatinine, evaluate for hydronephrosis  RENAL/URINARY TRACT ULTRASOUND COMPLETE  Comparison:  CT abdomen pelvis dated 02/08/2012  Findings:  Evaluation is mildly constrained due to portable technique.  Right Kidney:  Measures 11.6 cm.  No mass or hydronephrosis.  Left Kidney:  Measures 11.2 cm.  No mass or hydronephrosis.  Bladder:  Nonvisualized secondary to indwelling Foley catheter.  IMPRESSION: No hydronephrosis.  Original Report Authenticated By: Charline Bills, M.D.   Dg Chest Port 1 View  02/11/2012  *RADIOLOGY REPORT*  Clinical Data: Check ET tube position  PORTABLE CHEST - 1 VIEW  Comparison: 02/10/2012  Findings: Endotracheal tube terminates 4.5 cm above the carina. Weighted feeding tube terminates in the stomach.  Pulmonary vascular congestion without frank interstitial edema, improved.  Bibasilar opacities, likely atelectasis.  No pneumothorax.  The heart is normal in size.  IMPRESSION: Endotracheal tube terminates 4.5 cm above the carina.  Pulmonary vascular congestion without frank interstitial edema, improved.  Original Report Authenticated By: Charline Bills, M.D.   Dg Chest  Port 1 View  02/10/2012  *RADIOLOGY REPORT*  Clinical Data: Evaluate endotracheal tube placement.  PORTABLE CHEST - 1 VIEW  Comparison: Chest x-ray 02/09/2012.  Findings: Compared to the prior examination there has been interval advancement of the endotracheal tube which is now only 7 mm above the carina. A feeding tube is seen extending into the abdomen, tip within the body of the stomach.  Lung volumes are low.  There are persistent bibasilar opacities which may represent areas of atelectasis and/or consolidation,  with superimposed small bilateral pleural effusions.  Overall, aeration has slightly improved.  No evidence of pulmonary edema.  Heart size is mildly enlarged.  The patient is rotated to the right on today's exam, resulting in distortion of the mediastinal contours and reduced diagnostic sensitivity and specificity for mediastinal pathology. Atherosclerotic calcifications within the arch of the aorta.  IMPRESSION: 1.  Low-lying endotracheal tube.  Consider withdrawal 3-4 cm for more optimal placement. 2.  Slight improvement in aeration, however, there continues to be extensive areas of bibasilar atelectasis and/or consolidation with superimposed small bilateral pleural effusions. 3.  Atherosclerosis.  These results were called by telephone on 02/10/2012 at 09:50 a.m. to nurse Lauren in the ICU, who verbally acknowledged these results.  Original Report Authenticated By: Florencia Reasons, M.D.     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  Has hx of smoking. Plan: -pressure support wean as tolerated>>mental status, and debility are barriers to further weaning -f/u CXR -may need trach to assist with vent weaning  CARDIAC  5/15 Rt IJ TLC>>> 5/16 Echo>>EF 60 to 65%, grade 2 diastolic dysfx, mild MR, mild/mod LA dilation  Chronic diastolic heart failure, CAD, HTN, hyperlipidemia. Plan: -continue amlodipine, atorvastatin, hydralazine, HCTZ -no beta blocker due to intermittent bradycardia -resume ASA 81 mg daily  PVD s/p Rt AKA 5/17.  Lt heel partial thickness loss. Plan: -post-op care per VVS  Intermittent bradycardia.  Resolved.  RENAL  5/21 Renal u/s>>no hydronephrosis  Acute renal failure 2nd to ATN in setting of sepsis, hypotension with hx of CVA.  Baseline creatinine 0.81 from 02/02/12.  Making urine. Plan: -keep in even to negative fluid  balance -monitor urine outpt, renal fx  NEURO  5/18 CT head>>no acute findings 5/18 MRI head>>negative for acute infarct.  Generalized atrophy. 5/20 EEG>>delta background with triphasic waves 5/22 EEG>>delta background with triphasic waves  Acute metabolic encephalopathy.  EEG shows triphasic waves>>LFT's normal.  Has acute renal failure, sepsis.  MRI head unremarkable.   Plan: -depacon added 5/22 by neuro for possible seizure  Paraplegia. Plan: -continue baclofen, neurotin  ID  5/14 MR Rt foot>>posterior calcaneal osteomyelitis with cellulitis and low level fasciitis 5/18 CT abd/pelvis>>no source of infection 5/20 MR Rt thigh>>no findings for osteo  Cultures: 5/15 BCx2>>> negative 5/15 UC>>>negative 5/19 BC>>> 5/19 Sputum>>negative  Antibiotics: 5/14 Vanco (osteo)>>> 5/14 Zosyn (oseto)>>>5/20 5/20 Imipenem>>>5/23 5/20 Meropenem>>  Sepsis for Rt lower extremity osteomyelitis. Plan: -ABx per ID>>change imipenem to meropenem 5/23 due to concern for seizures.  HEMATOLOGY  Anemia of critical illness, and renal failure. Plan: -f/u CBC -transfuse for Hb < 7  GI  Nutrition. Plan: -tube feeds while on vent  ENDOCRINE  CBG (last 3)   Basename 02/11/12 0419 02/11/12 0011 02/10/12 2056  GLUCAP 184* 200* 223*  DM type II. Plan: -SSI  -increase lantus to 10 units 5/23  Hypothyroidism. Plan: -continue synthroid>>change to  per tube  Hx of Gout.  Not active issue. -monitor clinically   [facility]ST PRACTICE -Protonix for SUP -SQ heparin for DVT prophylaxis -Full code  Critical care time 40 minutes  Coralyn Helling, MD 02/11/2012, 9:37 AM Pager:  [phone]

## 2012-02-11 NOTE — Significant Event (Signed)
Had extensive conversation with several of pt's family members.  They expressed a great deal of concern and confusion about Alexandra Wiley current medical status.  I explained that she initially presented with non-healing foot wound with osteomyelitis.  This was complicated by elevated blood sugars.  As a result she required AKA.  Her post-op course was complicated by hypotension, possible PNA, and acute renal failure.  She developed metabolic encephalopathy and ?sub-clinical seizures.    Explained that infections are improving with current Abx managed by ID.  Explained that respiratory status has improved, and she is tolerating SBT.  However, mental status limits ability to proceed with extubation.  Explained that she may require tracheostomy if she does not have further improvement in mental status.  Explained that renal function has stabilized, and she has adequate urine outpt.  Main issues is mental status.  Awaiting response to depacon, and further improvement in metabolic derangements.  Additional critical care time 50 minutes.

## 2012-02-12 ENCOUNTER — Encounter: Payer: BC Managed Care – PPO | Admitting: Physical Medicine & Rehabilitation

## 2012-02-12 ENCOUNTER — Inpatient Hospital Stay (HOSPITAL_COMMUNITY): Payer: Medicare Other

## 2012-02-12 DIAGNOSIS — R197 Diarrhea, unspecified: Secondary | ICD-10-CM

## 2012-02-12 DIAGNOSIS — R404 Transient alteration of awareness: Secondary | ICD-10-CM

## 2012-02-12 LAB — GLUCOSE, CAPILLARY
Glucose-Capillary: 162 mg/dL — ABNORMAL HIGH (ref 70–99)
Glucose-Capillary: 136 mg/dL — ABNORMAL HIGH (ref 70–99)
Glucose-Capillary: 159 mg/dL — ABNORMAL HIGH (ref 70–99)
Glucose-Capillary: 126 mg/dL — ABNORMAL HIGH (ref 70–99)
Glucose-Capillary: 165 mg/dL — ABNORMAL HIGH (ref 70–99)

## 2012-02-12 LAB — BASIC METABOLIC PANEL
GFR calc non Af Amer: 21 mL/min — ABNORMAL LOW (ref >90)
Glucose, Bld: 192 mg/dL — ABNORMAL HIGH (ref 70–99)
Potassium: 3.2 mEq/L — ABNORMAL LOW (ref 3.5–5.1)
Sodium: 149 mEq/L — ABNORMAL HIGH (ref 135–145)

## 2012-02-12 LAB — CBC
Hemoglobin: 8.7 g/dL — ABNORMAL LOW (ref 12.0–15.0)
MCH: 26.4 pg (ref 26.0–34.0)
Platelets: 399 10*3/uL (ref 150–400)
RBC: 3.3 MIL/uL — ABNORMAL LOW (ref 3.87–5.11)
WBC: 21.7 10*3/uL — ABNORMAL HIGH (ref 4.0–10.5)

## 2012-02-12 LAB — CLOSTRIDIUM DIFFICILE BY PCR: Toxigenic C. Difficile by PCR: NEGATIVE

## 2012-02-12 MED ORDER — FREE WATER
250.0000 mL | Freq: Four times a day (QID) | Status: DC
  Administered 2012-02-12 – 2012-02-13 (×4): 250 mL

## 2012-02-12 MED ORDER — POTASSIUM CHLORIDE 20 MEQ/15ML (10%) PO LIQD
40.0000 meq | Freq: Once | ORAL | Status: AC
  Administered 2012-02-12: 40 meq via OROGASTRIC

## 2012-02-12 MED ORDER — POTASSIUM CHLORIDE 20 MEQ PO PACK
40.0000 meq | PACK | Freq: Once | ORAL | Status: DC
  Filled 2012-02-12: qty 2

## 2012-02-12 MED ORDER — INSULIN GLARGINE 100 UNIT/ML ~~LOC~~ SOLN
15.0000 [IU] | Freq: Every day | SUBCUTANEOUS | Status: DC
  Administered 2012-02-13 – 2012-02-23 (×10): 15 [IU] via SUBCUTANEOUS

## 2012-02-12 MED ORDER — VALPROATE SODIUM 500 MG/5ML IV SOLN
1500.0000 mg | Freq: Two times a day (BID) | INTRAVENOUS | Status: DC
  Administered 2012-02-12 – 2012-02-16 (×8): 1500 mg via INTRAVENOUS
  Filled 2012-02-12 (×13): qty 15

## 2012-02-12 MED ORDER — POTASSIUM CHLORIDE CRYS ER 20 MEQ PO TBCR: 40.0000 meq | EXTENDED_RELEASE_TABLET | Freq: Once | ORAL | Status: DC

## 2012-02-12 MED ORDER — POTASSIUM CHLORIDE 20 MEQ/15ML (10%) PO LIQD
ORAL | Status: AC
  Administered 2012-02-12: 40 meq via OROGASTRIC
  Filled 2012-02-12: qty 30

## 2012-02-12 NOTE — Progress Notes (Signed)
Family Medicine Teaching Service [facility] Progress Note  Patient name: Alexandra Wiley Medical record number: [id number] Date of birth: 20-Sep-1946 Age: 66 y.o. Gender: female    LOS: 10 days   Primary Care Provider: Laurena Slimmer, MD, MD  Overnight Events:  No acute events overnight.   Objective: Vital signs in last 24 hours: Temp:  [98.4 F (36.9 C)-99.6 F (37.6 C)] 98.8 F (37.1 C) (05/24 0805) Pulse Rate:  [63-126] 71  (05/24 1000) Resp:  [14-24] 15  (05/24 1000) BP: (72-155)/(34-101) 141/49 mmHg (05/24 1000) SpO2:  [100 %] 100 % (05/24 1000) FiO2 (%):  [39.6 %-40.5 %] 40 % (05/24 1000) Weight:  [174 lb 2.6 oz (79 kg)] 174 lb 2.6 oz (79 kg) (05/24 0500)  Wt Readings from Last 3 Encounters:  02/12/12 174 lb 2.6 oz (79 kg)  02/12/12 174 lb 2.6 oz (79 kg)  02/12/12 174 lb 2.6 oz (79 kg)     Current Facility-Administered Medications  Medication Dose Route Frequency Provider Last Rate Last Dose  . 0.45 % sodium chloride infusion   Intravenous Continuous [name], MD 10 mL/hr at 02/09/12 2000 10 mL at 02/09/12 2000  . acetaminophen (TYLENOL) tablet 650 mg  650 mg Oral Q6H PRN Lonia Skinner, MD   650 mg at 02/04/12 0804  . amLODipine (NORVASC) tablet 5 mg  5 mg Oral Daily Simonne Martinet, NP   5 mg at 02/12/12 1005  . antiseptic oral rinse (BIOTENE) solution 15 mL  15 mL Mouth Rinse QID Alyson Reedy, MD   15 mL at 02/12/12 0400  . aspirin chewable tablet 81 mg  81 mg Oral Daily Coralyn Helling, MD   81 mg at 02/12/12 1005  . atorvastatin (LIPITOR) tablet 10 mg  10 mg Oral Daily Lonia Skinner, MD   10 mg at 02/12/12 1005  . baclofen (LIORESAL) tablet 10 mg  10 mg Oral BID Lonia Skinner, MD   10 mg at 02/12/12 1005  . chlorhexidine (PERIDEX) 0.12 % solution 15 mL  15 mL Mouth Rinse BID Alyson Reedy, MD   15 mL at 02/12/12 0831  . feeding supplement (PRO-STAT SUGAR FREE 64) liquid 30 mL  30 mL Per Tube TID WC Hettie Holstein, RD   30 mL at 02/12/12  0831  . feeding supplement (PROMOTE) liquid 1,000 mL  1,000 mL Per Tube Q24H Hettie Holstein, RD   1,000 mL at 02/11/12 1523  . fentaNYL (SUBLIMAZE) injection 100 mcg  100 mcg Intravenous Q2H PRN Lonia Farber, MD   100 mcg at 02/12/12 0551  . free water 250 mL  250 mL Per Tube Q6H Simonne Martinet, NP      . gabapentin (NEURONTIN) capsule 300 mg  300 mg Oral QID Alyson Reedy, MD   300 mg at 02/12/12 1005  . heparin injection 5,000 Units  5,000 Units Subcutaneous Q8H Lonia Skinner, MD   5,000 Units at 02/12/12 0551  . hydrALAZINE (APRESOLINE) injection 10 mg  10 mg Intravenous Q4H PRN [name], MD      . hydrALAZINE (APRESOLINE) tablet 25 mg  25 mg Oral Q8H [name], MD   25 mg at 02/12/12 0559  . hydrochlorothiazide (MICROZIDE) capsule 12.5 mg  12.5 mg Oral Daily [name], MD   12.5 mg at 02/12/12 1005  . insulin aspart (novoLOG) injection 0-15 Units  0-15 Units Subcutaneous Q4H Leslye Peer, MD   2 Units at  02/12/12 0831  . insulin glargine (LANTUS) injection 10 Units  10 Units Subcutaneous Daily Coralyn Helling, MD   10 Units at 02/12/12 1000  . levothyroxine (SYNTHROID, LEVOTHROID) tablet 75 mcg  75 mcg Oral QAC breakfast Coralyn Helling, MD   75 mcg at 02/12/12 0831  . meropenem (MERREM) 500 mg in sodium chloride 0.9 % 50 mL IVPB  500 mg Intravenous Q12H Hilario Quarry Amend, PHARMD   500 mg at 02/12/12 0551  . pantoprazole sodium (PROTONIX) 40 mg/20 mL oral suspension 40 mg  40 mg Per Tube QHS Zigmund Gottron, MD   40 mg at 02/11/12 2119  . potassium chloride 20 MEQ/15ML (10%) liquid 40 mEq  40 mEq Per Tube Once Zigmund Gottron, MD   40 mEq at 02/12/12 0609  . sodium chloride 0.9 % injection 3 mL  3 mL Intravenous Q12H Lonia Skinner, MD   3 mL at 02/11/12 2124  . valproate (DEPACON) 1,500 mg in dextrose 5 % 50 mL IVPB  1,500 mg Intravenous Q12H Thana Farr, MD      . vancomycin (VANCOCIN) 1,500 mg in sodium chloride 0.9 % 500 mL  IVPB  1,500 mg Intravenous Q36H Christella Hartigan, PHARMD   1,500 mg at 02/11/12 2247  . DISCONTD: potassium chloride (KLOR-CON) packet 40 mEq  40 mEq Oral Once Zigmund Gottron, MD      . DISCONTD: potassium chloride SA (K-DUR,KLOR-CON) CR tablet 40 mEq  40 mEq Oral Once Zigmund Gottron, MD      . DISCONTD: valproate (DEPACON) 1,000 mg in dextrose 5 % 50 mL IVPB  1,000 mg Intravenous Q12H Thana Farr, MD   1,000 mg at 02/12/12 0551  . DISCONTD: valproate (DEPACON) 500 mg in dextrose 5 % 50 mL IVPB  500 mg Intravenous Q12H Thana Farr, MD   500 mg at 02/11/12 0545     PE: Filed Vitals:   02/12/12 0800 02/12/12 0805 02/12/12 0900 02/12/12 1000  BP: 109/44 109/44 141/49 141/49  Pulse: 71 73 63 71  Temp:  98.8 F (37.1 C)    TempSrc:  Oral    Resp: 17 19 15 15   Height:      Weight:      SpO2: 100% 100% 100% 100%   Gen: opens her eyes and follows command to squeeze hands. HEENT: moist mucous membranes, NG in place CV: S1S2, RRR, 3/6 systolic murmur unchanged from previous Res: coarse breath sounds from vent Abd: soft, non distended, present bowel sounds Ext/Musc: left heel elevated with protective bandage, right stump clean and dry.   Labs/Studies:  Basic Metabolic Panel:    Component Value Date/Time   NA 149* 02/12/2012 0420   K 3.2* 02/12/2012 0420   CL 108 02/12/2012 0420   CO2 34* 02/12/2012 0420   BUN 60* 02/12/2012 0420   CREATININE 2.28* 02/12/2012 0420   GLUCOSE 192* 02/12/2012 0420   CALCIUM 8.8 02/12/2012 0420   CBC:    Component Value Date/Time   WBC 21.7* 02/12/2012 0420   HGB 8.7* 02/12/2012 0420   HCT 27.6* 02/12/2012 0420   PLT 399 02/12/2012 0420   MCV 83.6 02/12/2012 0420   NEUTROABS 14.3* 02/11/2012 0415   LYMPHSABS 3.6 02/11/2012 0415   MONOABS 3.2* 02/11/2012 0415   EOSABS 0.0 02/11/2012 0415   BASOSABS 0.0 02/11/2012 0415    Chest X-ray: Endotracheal tube terminates 4.5 cm above the carina. Pulmonary vascular congestion without frank interstitial  edema, improved.   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 Initially on CCM service due to concern for sepsis. Was re-intubated on 5/18 because of unresponsiveness, decreased sensorium.   # sepsis in the setting of left posterior calcaneal osteomyelitis:  S/p right AKA. Post op day 7 - WBC improving  - on vanc (start 5/14) and meropenem (start 5/20) - cultures negative   # acute encephalopathy:  CCM managing. Patient intubated for airway preotection. No longer getting sedation. Pt likely with metabolic encephalopathy, but follows commands. Neurology on board. Negative MRI, CT. Initial EEG showing moderate encephalopathy of nonspecific etiology. Repeat EEG showed confirmed encephalopathy of non specific etiology. There is question as to whether there is ongoing seizure activity. Neuro recommends following and appreciate input.  - Increased to Depakote to 1000mg  BID - Depakote level this am was sub therapeutic.   # Peripheral vascular disease:  - continue to monitor left heal and foot  - follow up vascular surgery recommendations for potential revascularization of left leg in the future.   # Acute renal insufficiency: likely from hypovolemia and ATN. Stable.   # Acute Respiratory failure: no plan for extubation until improvement of mental status or plan to trach for assistance with vent weaning, per CCM.  # Diabetes:  CBG (last 3)   CBG (last 3)   Basename 02/12/12 0431 02/12/12 0021 02/11/12 2011  GLUCAP 162* 157* 212*  On NPH SSI - sliding scale coverage  - on Lantus 10units per CCM  # h/o spinal cord infarct:  - continue home meds for spasms and pain: baclofen 10bid, gabapentin 300 qid   # diastolic CHF grade 2 diastolic dysfunction with EF of 60-65% on May 2012 echo    Elevated BNP at 9763.   - Start ASA 81mg  - Hold on bblocker due to occasional bradycardia - continue current therapy w/ amlodipine, hydralazine, and HCTZ  # h/o gout: hold allopurinol. No ss of flare.  # h/o HTN: elevated BP:  - on amlodipine 5, hctz 12.5 and hydralazine prn   #h/o hypothyroid:  - continue home levothyroxine  - normal TSH   PPx:  - heparin sub q  PPI   Fen/Gi: npo, intubated  Dispo: currently on CCM's service. - Pt is FULL CODE   LOS 10  Signed: Marena Chancy, MD Family Medicine Resident PGY-1 [phone] 02/12/2012 11:22 AM

## 2012-02-12 NOTE — Progress Notes (Addendum)
[facility] [place]  Postoperative Visit - Amputation  Date of Surgery: 02/02/2012 - 02/05/2012 Procedure(s): AMPUTATION BELOW KNEE Right Surgeon: Surgeon(s): Larina Earthly, MD POD: 7 Days Post-Op  Subjective Alexandra Wiley is a 66 y.o. female who is S/P Right Procedure(s): AMPUTATION BELOW KNEE.  Pt.is intubated on ventalator. Significant Diagnostic Studies: CBC Lab Results  Component Value Date   WBC 21.7* 02/12/2012   HGB 8.7* 02/12/2012   HCT 27.6* 02/12/2012   MCV 83.6 02/12/2012   PLT 399 02/12/2012    BMET    Component Value Date/Time   NA 149* 02/12/2012 0420   K 3.2* 02/12/2012 0420   CL 108 02/12/2012 0420   CO2 34* 02/12/2012 0420   GLUCOSE 192* 02/12/2012 0420   BUN 60* 02/12/2012 0420   CREATININE 2.28* 02/12/2012 0420   CALCIUM 8.8 02/12/2012 0420   GFRNONAA 21* 02/12/2012 0420   GFRAA 24* 02/12/2012 0420    COAG Lab Results  Component Value Date   INR 1.11 02/05/2012   INR 1.16 02/03/2012   INR 1.0 11/19/2008   No results found for this basename: PTT     Intake/Output Summary (Last 24 hours) at 02/12/12 0720 Last data filed at 02/12/12 0700  Gross per 24 hour  Intake   2520 ml  Output   1200 ml  Net   1320 ml   Patient Vitals for the past 24 hrs:  Stool Color  02/11/12 2000 Brown  02/11/12 0800 Brown     Physical Examination  BP Readings from Last 3 Encounters:  02/12/12 72/38  02/12/12 72/38  02/12/12 72/38   Temp Readings from Last 3 Encounters:  02/12/12 99.6 F (37.6 C) Oral  02/12/12 99.6 F (37.6 C) Oral  02/12/12 99.6 F (37.6 C) Oral   SpO2 Readings from Last 3 Encounters:  02/12/12 100%  02/12/12 100%  02/12/12 100%   Pulse Readings from Last 3 Encounters:  02/12/12 65  02/12/12 65  02/12/12 65    Pt is A&Ox3  WDWN female with no complaints  Right amputation wound is clean, dry, no drainage and healing well.  There is good bone coverage in the stump Stump is warm and well perfused, without  drainage; without erythema   Assessment/plan:  Alexandra Wiley is a 66 y.o. female who is s/p Right Procedure(s): AMPUTATION BELOW KNEE  The patient's stump is clean, dry, intact or viable.  Follow-up 4 weeks from surgery  Clinton Gallant [facility] 7:20 AM 02/12/[phone]   Agree. Cont to watch left heel.   Will see again next week.  Call if we can help over weekend

## 2012-02-12 NOTE — Progress Notes (Signed)
Nutrition Follow-up  Patient remains on ventilator.  Tolerating TF change to high-protein formula without difficulty.  Increased protein needs are being provided with current TF regimen.  Diet Order:  Promote (via nasoenteric feeding tube) at 35 ml/h with Prostat 30 ml TID providing 1140 kcals, 98 grams protein, 705 ml free water daily.  Free water:  250 ml QID  Meds: Scheduled Meds:   . amLODipine  5 mg Oral Daily  . antiseptic oral rinse  15 mL Mouth Rinse QID  . aspirin  81 mg Oral Daily  . atorvastatin  10 mg Oral Daily  . baclofen  10 mg Oral BID  . chlorhexidine  15 mL Mouth Rinse BID  . feeding supplement  30 mL Per Tube TID WC  . feeding supplement (PROMOTE)  1,000 mL Per Tube Q24H  . free water  250 mL Per Tube Q6H  . gabapentin  300 mg Oral QID  . heparin  5,000 Units Subcutaneous Q8H  . hydrALAZINE  25 mg Oral Q8H  . hydrochlorothiazide  12.5 mg Oral Daily  . insulin aspart  0-15 Units Subcutaneous Q4H  . insulin glargine  15 Units Subcutaneous Daily  . levothyroxine  75 mcg Oral QAC breakfast  . meropenem (MERREM) IV  500 mg Intravenous Q12H  . pantoprazole sodium  40 mg Per Tube QHS  . potassium chloride  40 mEq Per Tube Once  . sodium chloride  3 mL Intravenous Q12H  . valproate sodium  1,500 mg Intravenous Q12H  . vancomycin  1,500 mg Intravenous Q36H  . DISCONTD: insulin glargine  10 Units Subcutaneous Daily  . DISCONTD: potassium chloride  40 mEq Oral Once  . DISCONTD: potassium chloride  40 mEq Oral Once  . DISCONTD: valproate sodium  1,000 mg Intravenous Q12H  . DISCONTD: valproate sodium  500 mg Intravenous Q12H   Continuous Infusions:   . sodium chloride 10 mL (02/09/12 2000)   PRN Meds:.acetaminophen, fentaNYL, hydrALAZINE  Labs:  CMP     Component Value Date/Time   NA 149* 02/12/2012 0420   K 3.2* 02/12/2012 0420   CL 108 02/12/2012 0420   CO2 34* 02/12/2012 0420   GLUCOSE 192* 02/12/2012 0420   BUN 60* 02/12/2012 0420   CREATININE 2.28* 02/12/2012  0420   CALCIUM 8.8 02/12/2012 0420   PROT 6.0 02/10/2012 0355   ALBUMIN 2.4* 02/10/2012 0355   AST 17 02/10/2012 0355   ALT 13 02/10/2012 0355   ALKPHOS 90 02/10/2012 0355   BILITOT 0.2* 02/10/2012 0355   GFRNONAA 21* 02/12/2012 0420   GFRAA 24* 02/12/2012 0420    CBG (last 3)   Basename 02/12/12 0431 02/12/12 0021 02/11/12 2011  GLUCAP 162* 157* 212*      Intake/Output Summary (Last 24 hours) at 02/12/12 1257 Last data filed at 02/12/12 1200  Gross per 24 hour  Intake   2700 ml  Output    985 ml  Net   1715 ml    Weight Status:  79 kg  BMI=35.2 (stable since 5/21)  Re-estimated needs:  1450 kcals, 90-100 grams protein, 1.6-1.8 liters fluid daily.  Nutrition Dx:  Inadequate oral intake, ongoing.  Goal:  Enteral nutrition to provide 60-70% of estimated calorie needs (22-25 kcals/kg ideal body weight) and 100% of estimated protein needs, based on ASPEN guidelines for permissive underfeeding in critically ill obese individuals.  Intervention:  Continue Promote at 35 ml/h with Prostat 30 ml TID to meet nutrition goal.  Monitor:  TF tolerance/adequacy, labs, weight trend.  Hettie Holstein Pager #:  [phone]

## 2012-02-12 NOTE — Progress Notes (Signed)
Name: Alexandra Wiley MRN: [id number] DOB: 19-Feb-1946    LOS: 10  [facility] Critical Care Note   History of Present Illness:  66 yo female smoker admitted by [facility] on 02/02/2012 non-healing ulcer 1st Rt toe and heel.  Seen by VVS and felt to have non-viable Rt lower extremity.  Transferred to ICU 5/16 for low BP, acute encephalopathy, possible sepsis, and PCCM consulted.  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  Intubated 5/18 due to altered mental status from hyperkalemia, acidosis, and shock.  Neurology consulted 5/18.  ID consulted 5/20. PMHx CVA, Paraplegia form spinal cord infarct, DM, CAD, HTN, Gout, PVD, Hypothyroidism  Tests / Events: 5/20- slow improvement neurostatus 5/20 imipenem started 5/22 Add depacon for possible seizures  Subjective: Follows simple commands. Tolerating PS   Vital Signs: Temp:  [98.4 F (36.9 C)-99.6 F (37.6 C)] 98.8 F (37.1 C) (05/24 0805) Pulse Rate:  [63-139] 73  (05/24 0805) Resp:  [14-24] 19  (05/24 0805) BP: (72-158)/(34-101) 109/44 mmHg (05/24 0805) SpO2:  [100 %] 100 % (05/24 0805) FiO2 (%):  [39.8 %-40.5 %] 40 % (05/24 0805) Weight:  [79 kg (174 lb 2.6 oz)] 79 kg (174 lb 2.6 oz) (05/24 0500) I/O last 3 completed shifts: In: 3423 [I.V.:320; Other:3; NG/GT:1730; IV Piggyback:1370] Out: 1985 [Urine:1985]  Physical Examination: General - no distress HEENT - ETT in place Cardiac - s1s2 regular Chest - clear  Abd - soft, + bowel sounds Ext - Rt AKA stump wound clean Neuro - more awake today, follows simple commands   CBC  Lab 02/12/12 0420 02/11/12 0415 02/10/12 0355  HGB 8.7* 9.6* 8.9*  HCT 27.6* 29.0* 27.2*  WBC 21.7* 21.1* 19.7*  PLT 399 467* 427*   BMET  Lab 02/12/12 0420 02/11/12 0415 02/10/12 0355 02/09/12 1752 02/09/12 0600  NA 149* 148* 140 142 141  K 3.2* 3.6 -- -- --  CL 108 108 103 104 104  CO2 34* 31 28 28 24   GLUCOSE 192* 192* 234* 164* 150*  BUN 60* 45* 32* 24* 22  CREATININE 2.28* 2.04* 2.15* 2.11* 2.25*  CALCIUM 8.8  9.3 8.8 8.8 8.5  MG -- -- -- -- --  PHOS -- -- -- -- --    Lab Results  Component Value Date   ALT 13 02/10/2012   AST 17 02/10/2012   ALKPHOS 90 02/10/2012   BILITOT 0.2* 02/10/2012     Dg Chest Port 1 View  02/12/2012  *RADIOLOGY REPORT*  Clinical Data: Respiratory failure.  Evaluate endotracheal tube position.  PORTABLE CHEST - 1 VIEW  Comparison: Chest x-ray 02/11/2012.  Findings: An endotracheal tube is in place with tip 6.2 cm above the carina. A feeding tube is seen extending into the abdomen, with tip in the proximal stomach.  Lung volumes are lower limits of normal.  No definite focal consolidative airspace disease.  Trace bilateral pleural effusions.  Mild pulmonary venous congestion without frank pulmonary edema.  Minimal bibasilar linear opacities are favored to represent subsegmental atelectasis (slightly improved).  Heart size is within normal limits. The patient is rotated to the left on today's exam, resulting in distortion of the mediastinal contours and reduced diagnostic sensitivity and specificity for mediastinal pathology.  Atherosclerotic calcifications within the arch of the aorta.  IMPRESSION: 1.  Support apparatus, as above. 2.  Slight improved aeration with decreasing bibasilar subsegmental atelectasis and decreasing trace bilateral pleural effusions. 3.  Atherosclerosis.  Original Report Authenticated By: Florencia Reasons, M.D.   Dg Chest Port 1  View  02/11/2012  *RADIOLOGY REPORT*  Clinical Data: Check ET tube position  PORTABLE CHEST - 1 VIEW  Comparison: 02/10/2012  Findings: Endotracheal tube terminates 4.5 cm above the carina. Weighted feeding tube terminates in the stomach.  Pulmonary vascular congestion without frank interstitial edema, improved.  Bibasilar opacities, likely atelectasis.  No pneumothorax.  The heart is normal in size.  IMPRESSION: Endotracheal tube terminates 4.5 cm above the carina.  Pulmonary vascular congestion without frank interstitial edema,  improved.  Original Report Authenticated By: Charline Bills, M.D.     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  Has hx of smoking. Plan: -pressure support wean as tolerated>>mental status, and debility are barriers to further weaning -f/u CXR -may need trach to assist with vent weaning  CARDIAC  5/15 Rt IJ TLC>>>5/22 5/16 Echo>>EF 60 to 65%, grade 2 diastolic dysfx, mild MR, mild/mod LA dilation  Chronic diastolic heart failure, CAD, HTN, hyperlipidemia. Plan: -continue amlodipine, atorvastatin, hydralazine, HCTZ -no beta blocker due to intermittent bradycardia -resumed ASA 81 mg daily  PVD s/p Rt AKA 5/17.  Lt heel partial thickness loss. Plan: -post-op care per VVS  Intermittent bradycardia.  Resolved.  RENAL  5/21 Renal u/s>>no hydronephrosis  Acute renal failure 2nd to ATN in setting of sepsis, hypotension with hx of CVA.  Baseline creatinine 0.81 from 02/02/12.  Making urine. Plan: -keep in even to negative fluid balance -monitor urine outpt, renal fx  Hypernatremia Plan: -free water flushes  NEURO  5/18 CT head>>no acute findings 5/18 MRI head>>negative for acute infarct.  Generalized atrophy. 5/20 EEG>>delta background with triphasic waves 5/22 EEG>>delta background with triphasic waves 5/24 EEG >>>  Acute metabolic encephalopathy.  EEG shows triphasic waves>>LFT's normal.  Has acute renal failure, sepsis.  MRI head unremarkable.  Improved mental status 5/24. Plan: -depacon added 5/22 by neuro for possible seizure -f/u EEG 5/24  Paraplegia. Plan: -continue baclofen, neurotin  ID  5/14 MR Rt foot>>posterior calcaneal osteomyelitis with cellulitis and low level fasciitis 5/18 CT abd/pelvis>>no source of infection 5/20 MR Rt thigh>>no findings for osteo 5/24 MR C-spine >>>  Cultures: 5/15 BCx2>>>  negative 5/15 UC>>>negative 5/19 BC>>> 5/19 Sputum>>negative  Antibiotics: 5/14 Vanco (osteo)>>> 5/14 Zosyn (oseto)>>>5/20 5/20 Imipenem>>>5/23 5/20 Meropenem>>  Sepsis for Rt lower extremity osteomyelitis. Plan: -ABx per ID>>change imipenem to meropenem 5/23 due to concern for seizures.  HEMATOLOGY  Anemia of critical illness, and renal failure. Plan: -f/u CBC -transfuse for Hb < 7  GI  Nutrition. Plan: -tube feeds while on vent  ENDOCRINE  CBG (last 3)   Basename 02/12/12 0431 02/12/12 0021 02/11/12 2011  GLUCAP 162* 157* 212*  DM type II. Plan: -SSI  -increase lantus to 15 units 5/24  Hypothyroidism. Plan: -continue synthroid>>change to per tube  Hx of Gout.  Not active issue. -monitor clinically   BEST PRACTICE -Protonix for SUP -SQ heparin for DVT prophylaxis -Full code  Reviewed above, examined patient and agree with assessment/plan.  Critical care time 35 minutes.  Coralyn Helling, MD 02/12/2012, 11:37 AM Pager:  [phone]

## 2012-02-12 NOTE — Progress Notes (Signed)
INFECTIOUS DISEASE PROGRESS NOTE  ID: MESHAWN OCONNOR is a 66 y.o. female with   Principal Problem:  *Osteomyelitis of right foot Active Problems:  Conus medullaris syndrome  Septic shock  Oliguria  Diabetes mellitus  Altered mental status  Acute respiratory failure  Subjective: Awake and alert. Nods to questions  Abtx:  Anti-infectives     Start     Dose/Rate Route Frequency Ordered Stop   02/11/12 2000   vancomycin (VANCOCIN) 1,500 mg in sodium chloride 0.9 % 500 mL IVPB        1,500 mg 250 mL/hr over 120 Minutes Intravenous Every 36 hours 02/11/12 0911     02/10/12 1800   meropenem (MERREM) 500 mg in sodium chloride 0.9 % 50 mL IVPB        500 mg 100 mL/hr over 30 Minutes Intravenous Every 12 hours 02/10/12 1724     02/08/12 1200   imipenem-cilastatin (PRIMAXIN) 250 mg in sodium chloride 0.9 % 100 mL IVPB  Status:  Discontinued        250 mg 200 mL/hr over 30 Minutes Intravenous 4 times per day 02/08/12 1046 02/10/12 1647   02/07/12 1500   piperacillin-tazobactam (ZOSYN) IVPB 2.25 g  Status:  Discontinued        2.25 g 100 mL/hr over 30 Minutes Intravenous 3 times per day 02/07/12 1458 02/08/12 1046   02/07/12 0800   vancomycin (VANCOCIN) IVPB 1000 mg/200 mL premix  Status:  Discontinued        1,000 mg 200 mL/hr over 60 Minutes Intravenous Every 24 hours 02/07/12 1459 02/11/12 0840   02/03/12 2000   vancomycin (VANCOCIN) IVPB 1000 mg/200 mL premix  Status:  Discontinued        1,000 mg 200 mL/hr over 60 Minutes Intravenous Every 12 hours 02/03/12 0919 02/07/12 1459   02/03/12 1730   piperacillin-tazobactam (ZOSYN) IVPB 3.375 g  Status:  Discontinued        3.375 g 100 mL/hr over 30 Minutes Intravenous  Once 02/03/12 1723 02/03/12 1725   02/03/12 1730   vancomycin (VANCOCIN) IVPB 1000 mg/200 mL premix  Status:  Discontinued        1,000 mg 200 mL/hr over 60 Minutes Intravenous  Once 02/03/12 1723 02/03/12 1725   02/03/12 0100   piperacillin-tazobactam (ZOSYN)  IVPB 3.375 g  Status:  Discontinued        3.375 g 12.5 mL/hr over 240 Minutes Intravenous Every 8 hours 02/02/12 2233 02/07/12 1458   02/02/12 1700   vancomycin (VANCOCIN) 1,250 mg in sodium chloride 0.9 % 250 mL IVPB  Status:  Discontinued        1,250 mg 166.7 mL/hr over 90 Minutes Intravenous Every 12 hours 02/02/12 1613 02/03/12 0919   02/02/12 1545  piperacillin-tazobactam (ZOSYN) 3.375 g in dextrose 5 % 50 mL IVPB       3.375 g 100 mL/hr over 30 Minutes Intravenous  Once 02/02/12 1530 02/02/12 1937          Medications:  Scheduled:   . amLODipine  5 mg Oral Daily  . antiseptic oral rinse  15 mL Mouth Rinse QID  . aspirin  81 mg Oral Daily  . atorvastatin  10 mg Oral Daily  . baclofen  10 mg Oral BID  . chlorhexidine  15 mL Mouth Rinse BID  . feeding supplement  30 mL Per Tube TID WC  . feeding supplement (PROMOTE)  1,000 mL Per Tube Q24H  . free water  250 mL Per  Tube Q6H  . gabapentin  300 mg Oral QID  . heparin  5,000 Units Subcutaneous Q8H  . hydrALAZINE  25 mg Oral Q8H  . hydrochlorothiazide  12.5 mg Oral Daily  . insulin aspart  0-15 Units Subcutaneous Q4H  . insulin glargine  10 Units Subcutaneous Daily  . levothyroxine  75 mcg Oral QAC breakfast  . meropenem (MERREM) IV  500 mg Intravenous Q12H  . pantoprazole sodium  40 mg Per Tube QHS  . potassium chloride  40 mEq Per Tube Once  . sodium chloride  3 mL Intravenous Q12H  . valproate sodium  1,500 mg Intravenous Q12H  . vancomycin  1,500 mg Intravenous Q36H  . DISCONTD: potassium chloride  40 mEq Oral Once  . DISCONTD: potassium chloride  40 mEq Oral Once  . DISCONTD: valproate sodium  1,000 mg Intravenous Q12H  . DISCONTD: valproate sodium  500 mg Intravenous Q12H    Objective: Vital signs in last 24 hours: Temp:  [98.4 F (36.9 C)-99.6 F (37.6 C)] 98.8 F (37.1 C) (05/24 0805) Pulse Rate:  [63-126] 63  (05/24 0900) Resp:  [14-24] 15  (05/24 0900) BP: (72-158)/(34-101) 141/49 mmHg (05/24  0900) SpO2:  [100 %] 100 % (05/24 0900) FiO2 (%):  [39.6 %-40.5 %] 39.6 % (05/24 0900) Weight:  [79 kg (174 lb 2.6 oz)] 79 kg (174 lb 2.6 oz) (05/24 0500)   General appearance: alert, cooperative and no distress Eyes: EOMI Resp: clear to auscultation bilaterally Cardio: regular rate and rhythm GI: normal findings: bowel sounds normal and soft, non-tender Extremities: LLE wound is unchanged, clean.  Neurologic: Motor: she grips with both hands to command.   Lab Results  Basename 02/12/12 0420 02/11/12 0415  WBC 21.7* 21.1*  HGB 8.7* 9.6*  HCT 27.6* 29.0*  NA 149* 148*  K 3.2* 3.6  CL 108 108  CO2 34* 31  BUN 60* 45*  CREATININE 2.28* 2.04*  GLU -- --   Liver Panel  Basename 02/10/12 0355  PROT 6.0  ALBUMIN 2.4*  AST 17  ALT 13  ALKPHOS 90  BILITOT 0.2*  BILIDIR --  IBILI --   Sedimentation Rate No results found for this basename: ESRSEDRATE in the last 72 hours C-Reactive Protein No results found for this basename: CRP:2 in the last 72 hours  Microbiology: Recent Results (from the past 240 hour(s))  MRSA PCR SCREENING     Status: Normal   Collection Time   02/03/12  6:10 PM      Component Value Range Status Comment   MRSA by PCR NEGATIVE  NEGATIVE  Final   CULTURE, BLOOD (ROUTINE X 2)     Status: Normal   Collection Time   02/03/12  6:15 PM      Component Value Range Status Comment   Specimen Description BLOOD CENTRAL LINE   Final    Special Requests     Final    Value: BOTTLES DRAWN AEROBIC AND ANAEROBIC 10CC LEFT IJ CVC   Culture  Setup Time [id number]   Final    Culture NO GROWTH 5 DAYS   Final    Report Status 02/10/2012 FINAL   Final   URINE CULTURE     Status: Normal   Collection Time   02/03/12  6:20 PM      Component Value Range Status Comment   Specimen Description URINE, CATHETERIZED   Final    Special Requests NONE   Final    Culture  Setup Time [id number]  Final    Colony Count NO GROWTH   Final    Culture NO GROWTH   Final    Report  Status 02/05/2012 FINAL   Final   CULTURE, BLOOD (ROUTINE X 2)     Status: Normal   Collection Time   02/03/12  7:58 PM      Component Value Range Status Comment   Specimen Description BLOOD HAND RIGHT   Final    Special Requests BOTTLES DRAWN AEROBIC ONLY 3CC   Final    Culture  Setup Time [id number]   Final    Culture NO GROWTH 5 DAYS   Final    Report Status 02/10/2012 FINAL   Final   CULTURE, BLOOD (ROUTINE X 2)     Status: Normal (Preliminary result)   Collection Time   02/07/12 11:42 AM      Component Value Range Status Comment   Specimen Description BLOOD RIGHT HAND   Final    Special Requests BOTTLES DRAWN AEROBIC ONLY 3CC   Final    Culture  Setup Time [id number]   Final    Culture     Final    Value:        BLOOD CULTURE RECEIVED NO GROWTH TO DATE CULTURE WILL BE HELD FOR 5 DAYS BEFORE ISSUING A FINAL NEGATIVE REPORT   Report Status PENDING   Incomplete   CULTURE, BLOOD (ROUTINE X 2)     Status: Normal (Preliminary result)   Collection Time   02/07/12  1:37 PM      Component Value Range Status Comment   Specimen Description BLOOD LEFT HAND   Final    Special Requests BOTTLES DRAWN AEROBIC AND ANAEROBIC 3CC   Final    Culture  Setup Time [id number]   Final    Culture     Final    Value:        BLOOD CULTURE RECEIVED NO GROWTH TO DATE CULTURE WILL BE HELD FOR 5 DAYS BEFORE ISSUING A FINAL NEGATIVE REPORT   Report Status PENDING   Incomplete   CULTURE, RESPIRATORY     Status: Normal   Collection Time   02/07/12  2:22 PM      Component Value Range Status Comment   Specimen Description TRACHEAL ASPIRATE   Final    Special Requests NONE   Final    Gram Stain     Final    Value: FEW WBC PRESENT, PREDOMINANTLY PMN     NO SQUAMOUS EPITHELIAL CELLS SEEN     NO ORGANISMS SEEN   Culture Non-Pathogenic Oropharyngeal-type Flora Isolated.   Final    Report Status 02/10/2012 FINAL   Final     Studies/Results: Dg Chest Port 1 View  02/12/2012  *RADIOLOGY REPORT*  Clinical  Data: Respiratory failure.  Evaluate endotracheal tube position.  PORTABLE CHEST - 1 VIEW  Comparison: Chest x-ray 02/11/2012.  Findings: An endotracheal tube is in place with tip 6.2 cm above the carina. A feeding tube is seen extending into the abdomen, with tip in the proximal stomach.  Lung volumes are lower limits of normal.  No definite focal consolidative airspace disease.  Trace bilateral pleural effusions.  Mild pulmonary venous congestion without frank pulmonary edema.  Minimal bibasilar linear opacities are favored to represent subsegmental atelectasis (slightly improved).  Heart size is within normal limits. The patient is rotated to the left on today's exam, resulting in distortion of the mediastinal contours and reduced diagnostic sensitivity and specificity for mediastinal pathology.  Atherosclerotic  calcifications within the arch of the aorta.  IMPRESSION: 1.  Support apparatus, as above. 2.  Slight improved aeration with decreasing bibasilar subsegmental atelectasis and decreasing trace bilateral pleural effusions. 3.  Atherosclerosis.  Original Report Authenticated By: Florencia Reasons, M.D.   Dg Chest Port 1 View  02/11/2012  *RADIOLOGY REPORT*  Clinical Data: Check ET tube position  PORTABLE CHEST - 1 VIEW  Comparison: 02/10/2012  Findings: Endotracheal tube terminates 4.5 cm above the carina. Weighted feeding tube terminates in the stomach.  Pulmonary vascular congestion without frank interstitial edema, improved.  Bibasilar opacities, likely atelectasis.  No pneumothorax.  The heart is normal in size.  IMPRESSION: Endotracheal tube terminates 4.5 cm above the carina.  Pulmonary vascular congestion without frank interstitial edema, improved.  Original Report Authenticated By: Charline Bills, M.D.     Assessment/Plan: Sepsis/SIRS  L heel ulcer Leukocytosis Encephalopathy Day 11 Anbx (Merrem/vanco)  MRI with ? Of myositis in thighs  MRI/A does not show significant ischemia- has  slow distal flow on L. Will defer to surgery.  BCx and resp Cx are negative (5-15, 5-19)  She has made some progress. Will cont to watch.  To go for MRI of spine today to see if she has further infarct of her spine.    Johny Sax Infectious Diseases [phone] 02/12/2012, 10:09 AM   LOS: 10 days

## 2012-02-12 NOTE — Progress Notes (Signed)
Family Medicine Teaching Service Attending Note  I discussed patient Alexandra Wiley  with Dr. Gwenlyn Saran and reviewed their note for today.  I agree with their assessment and plan.

## 2012-02-12 NOTE — Progress Notes (Signed)
Subjective: Patient seems somewhat more alert today.  Tolerating weaning procedures.    Objective: Current vital signs: BP 109/44  Pulse 73  Temp(Src) 99.6 F (37.6 C) (Oral)  Resp 19  Ht 4\' 11"  (1.499 m)  Wt 79 kg (174 lb 2.6 oz)  BMI 35.18 kg/m2  SpO2 100% Vital signs in last 24 hours: Temp:  [98.4 F (36.9 C)-99.6 F (37.6 C)] 99.6 F (37.6 C) (05/24 0451) Pulse Rate:  [63-139] 73  (05/24 0805) Resp:  [14-24] 19  (05/24 0805) BP: (72-158)/(34-101) 109/44 mmHg (05/24 0805) SpO2:  [100 %] 100 % (05/24 0805) FiO2 (%):  [39.8 %-40.5 %] 40 % (05/24 0805) Weight:  [79 kg (174 lb 2.6 oz)] 79 kg (174 lb 2.6 oz) (05/24 0500)  Intake/Output from previous day: 05/23 0701 - 05/24 0700 In: 2520 [I.V.:200; NG/GT:1160; IV Piggyback:1160] Out: 1200 [Urine:1200] Intake/Output this shift:   Nutritional status: NPO  Neurologic Exam: Mental Status:  Opens eyes when name called. Follows simple commands. Moves lips at questioning as if attempting to speak. Cranial Nerves:  II: Pupils reactive  III,IV, VI: ptosis not present, extra-ocular motions intact bilaterally with patient tracking as I move around the room. V,VII: Moves face symmetrically  VIII: Grossly intact  IX,X: Unable to test  XI: Unable to test  XII: tongue strength normal  Motor:  Although patient able to squeeze my hands bilaterally to command, unable to function against gravity.  Makes attempts to move arms and legs with muscle contraction seen but does not lift off the bed.  Tone and bulk:normal tone throughout; no atrophy noted  DTR's: Trace in the upper extremities and absent in the left lower extemity Plantars:  Right: Amputation   Left: mute Cerebellar: Unable to perform  Lab Results: Results for orders placed during the hospital encounter of 02/02/12 (from the past 48 hour(s))  GLUCOSE, CAPILLARY     Status: Abnormal   Collection Time   02/10/12 11:05 AM      Component Value Range Comment   Glucose-Capillary 229 (*) 70 - 99 (mg/dL)   AMMONIA     Status: Normal   Collection Time   02/10/12 11:14 AM      Component Value Range Comment   Ammonia 48  11 - 60 (umol/L)   GLUCOSE, CAPILLARY     Status: Abnormal   Collection Time   02/10/12  3:14 PM      Component Value Range Comment   Glucose-Capillary 200 (*) 70 - 99 (mg/dL)   GLUCOSE, CAPILLARY     Status: Abnormal   Collection Time   02/10/12  8:56 PM      Component Value Range Comment   Glucose-Capillary 223 (*) 70 - 99 (mg/dL)   GLUCOSE, CAPILLARY     Status: Abnormal   Collection Time   02/11/12 12:11 AM      Component Value Range Comment   Glucose-Capillary 200 (*) 70 - 99 (mg/dL)    Comment 1 Documented in Chart      Comment 2 Notify RN     BASIC METABOLIC PANEL     Status: Abnormal   Collection Time   02/11/12  4:15 AM      Component Value Range Comment   Sodium 148 (*) 135 - 145 (mEq/L) DELTA CHECK NOTED   Potassium 3.6  3.5 - 5.1 (mEq/L)    Chloride 108  96 - 112 (mEq/L)    CO2 31  19 - 32 (mEq/L)    Glucose, Bld 192 (*)  70 - 99 (mg/dL)    BUN 45 (*) 6 - 23 (mg/dL)    Creatinine, Ser 9.81 (*) 0.50 - 1.10 (mg/dL)    Calcium 9.3  8.4 - 10.5 (mg/dL)    GFR calc non Af Amer 24 (*) >90 (mL/min)    GFR calc Af Amer 28 (*) >90 (mL/min)   CBC     Status: Abnormal   Collection Time   02/11/12  4:15 AM      Component Value Range Comment   WBC 21.1 (*) 4.0 - 10.5 (K/uL)    RBC 3.50 (*) 3.87 - 5.11 (MIL/uL)    Hemoglobin 9.6 (*) 12.0 - 15.0 (g/dL)    HCT 19.1 (*) 47.8 - 46.0 (%)    MCV 82.9  78.0 - 100.0 (fL)    MCH 27.4  26.0 - 34.0 (pg)    MCHC 33.1  30.0 - 36.0 (g/dL)    RDW 29.5  62.1 - 30.8 (%)    Platelets 467 (*) 150 - 400 (K/uL)   DIFFERENTIAL     Status: Abnormal   Collection Time   02/11/12  4:15 AM      Component Value Range Comment   Neutrophils Relative 68  43 - 77 (%)    Lymphocytes Relative 17  12 - 46 (%)    Monocytes Relative 15 (*) 3 - 12 (%)    Eosinophils Relative 0  0 - 5 (%)    Basophils  Relative 0  0 - 1 (%)    Neutro Abs 14.3 (*) 1.7 - 7.7 (K/uL)    Lymphs Abs 3.6  0.7 - 4.0 (K/uL)    Monocytes Absolute 3.2 (*) 0.1 - 1.0 (K/uL)    Eosinophils Absolute 0.0  0.0 - 0.7 (K/uL)    Basophils Absolute 0.0  0.0 - 0.1 (K/uL)    RBC Morphology POLYCHROMASIA PRESENT   TARGET CELLS   Smear Review PLATELETS APPEAR ADEQUATE   LARGE PLATELETS PRESENT  VALPROIC ACID LEVEL     Status: Abnormal   Collection Time   02/11/12  4:15 AM      Component Value Range Comment   Valproic Acid Lvl <10.0 (*) 50.0 - 100.0 (ug/mL)   GLUCOSE, CAPILLARY     Status: Abnormal   Collection Time   02/11/12  4:19 AM      Component Value Range Comment   Glucose-Capillary 184 (*) 70 - 99 (mg/dL)    Comment 1 Documented in Chart      Comment 2 Notify RN     VANCOMYCIN, [place]     Status: Abnormal   Collection Time   02/11/12  7:30 AM      Component Value Range Comment   Vancomycin Tr 29.8 (*) 10.0 - 20.0 (ug/mL)   GLUCOSE, CAPILLARY     Status: Abnormal   Collection Time   02/11/12  7:50 AM      Component Value Range Comment   Glucose-Capillary 208 (*) 70 - 99 (mg/dL)   GLUCOSE, CAPILLARY     Status: Abnormal   Collection Time   02/11/12 11:16 AM      Component Value Range Comment   Glucose-Capillary 182 (*) 70 - 99 (mg/dL)   GLUCOSE, CAPILLARY     Status: Abnormal   Collection Time   02/11/12  3:55 PM      Component Value Range Comment   Glucose-Capillary 199 (*) 70 - 99 (mg/dL)   GLUCOSE, CAPILLARY     Status: Abnormal   Collection Time  02/11/12  8:11 PM      Component Value Range Comment   Glucose-Capillary 212 (*) 70 - 99 (mg/dL)   GLUCOSE, CAPILLARY     Status: Abnormal   Collection Time   02/12/12 12:21 AM      Component Value Range Comment   Glucose-Capillary 157 (*) 70 - 99 (mg/dL)    Comment 1 Documented in Chart      Comment 2 Notify RN     BASIC METABOLIC PANEL     Status: Abnormal   Collection Time   02/12/12  4:20 AM      Component Value Range Comment   Sodium 149 (*) 135 - 145  (mEq/L)    Potassium 3.2 (*) 3.5 - 5.1 (mEq/L)    Chloride 108  96 - 112 (mEq/L)    CO2 34 (*) 19 - 32 (mEq/L)    Glucose, Bld 192 (*) 70 - 99 (mg/dL)    BUN 60 (*) 6 - 23 (mg/dL)    Creatinine, Ser 1.61 (*) 0.50 - 1.10 (mg/dL)    Calcium 8.8  8.4 - 10.5 (mg/dL)    GFR calc non Af Amer 21 (*) >90 (mL/min)    GFR calc Af Amer 24 (*) >90 (mL/min)   CBC     Status: Abnormal   Collection Time   02/12/12  4:20 AM      Component Value Range Comment   WBC 21.7 (*) 4.0 - 10.5 (K/uL)    RBC 3.30 (*) 3.87 - 5.11 (MIL/uL)    Hemoglobin 8.7 (*) 12.0 - 15.0 (g/dL)    HCT 09.6 (*) 04.5 - 46.0 (%)    MCV 83.6  78.0 - 100.0 (fL)    MCH 26.4  26.0 - 34.0 (pg)    MCHC 31.5  30.0 - 36.0 (g/dL)    RDW 40.9  81.1 - 91.4 (%)    Platelets 399  150 - 400 (K/uL)   VALPROIC ACID LEVEL     Status: Abnormal   Collection Time   02/12/12  4:20 AM      Component Value Range Comment   Valproic Acid Lvl <10.0 (*) 50.0 - 100.0 (ug/mL)   GLUCOSE, CAPILLARY     Status: Abnormal   Collection Time   02/12/12  4:31 AM      Component Value Range Comment   Glucose-Capillary 162 (*) 70 - 99 (mg/dL)    Comment 1 Notify RN      Comment 2 Documented in Chart       Recent Results (from the past 240 hour(s))  MRSA PCR SCREENING     Status: Normal   Collection Time   02/03/12  6:10 PM      Component Value Range Status Comment   MRSA by PCR NEGATIVE  NEGATIVE  Final   CULTURE, BLOOD (ROUTINE X 2)     Status: Normal   Collection Time   02/03/12  6:15 PM      Component Value Range Status Comment   Specimen Description BLOOD CENTRAL LINE   Final    Special Requests     Final    Value: BOTTLES DRAWN AEROBIC AND ANAEROBIC 10CC LEFT IJ CVC   Culture  Setup Time [id number]   Final    Culture NO GROWTH 5 DAYS   Final    Report Status 02/10/2012 FINAL   Final   URINE CULTURE     Status: Normal   Collection Time   02/03/12  6:20 PM  Component Value Range Status Comment   Specimen Description URINE, CATHETERIZED    Final    Special Requests NONE   Final    Culture  Setup Time [id number]   Final    Colony Count NO GROWTH   Final    Culture NO GROWTH   Final    Report Status 02/05/2012 FINAL   Final   CULTURE, BLOOD (ROUTINE X 2)     Status: Normal   Collection Time   02/03/12  7:58 PM      Component Value Range Status Comment   Specimen Description BLOOD HAND RIGHT   Final    Special Requests BOTTLES DRAWN AEROBIC ONLY 3CC   Final    Culture  Setup Time [id number]   Final    Culture NO GROWTH 5 DAYS   Final    Report Status 02/10/2012 FINAL   Final   CULTURE, BLOOD (ROUTINE X 2)     Status: Normal (Preliminary result)   Collection Time   02/07/12 11:42 AM      Component Value Range Status Comment   Specimen Description BLOOD RIGHT HAND   Final    Special Requests BOTTLES DRAWN AEROBIC ONLY 3CC   Final    Culture  Setup Time [id number]   Final    Culture     Final    Value:        BLOOD CULTURE RECEIVED NO GROWTH TO DATE CULTURE WILL BE HELD FOR 5 DAYS BEFORE ISSUING A FINAL NEGATIVE REPORT   Report Status PENDING   Incomplete   CULTURE, BLOOD (ROUTINE X 2)     Status: Normal (Preliminary result)   Collection Time   02/07/12  1:37 PM      Component Value Range Status Comment   Specimen Description BLOOD LEFT HAND   Final    Special Requests BOTTLES DRAWN AEROBIC AND ANAEROBIC 3CC   Final    Culture  Setup Time [id number]   Final    Culture     Final    Value:        BLOOD CULTURE RECEIVED NO GROWTH TO DATE CULTURE WILL BE HELD FOR 5 DAYS BEFORE ISSUING A FINAL NEGATIVE REPORT   Report Status PENDING   Incomplete   CULTURE, RESPIRATORY     Status: Normal   Collection Time   02/07/12  2:22 PM      Component Value Range Status Comment   Specimen Description TRACHEAL ASPIRATE   Final    Special Requests NONE   Final    Gram Stain     Final    Value: FEW WBC PRESENT, PREDOMINANTLY PMN     NO SQUAMOUS EPITHELIAL CELLS SEEN     NO ORGANISMS SEEN   Culture Non-Pathogenic  Oropharyngeal-type [place] Isolated.   Final    Report Status 02/10/2012 FINAL   Final     Studies/Results: Dg Chest Port 1 View  02/12/2012  *RADIOLOGY REPORT*  Clinical Data: Respiratory failure.  Evaluate endotracheal tube position.  PORTABLE CHEST - 1 VIEW  Comparison: Chest x-ray 02/11/2012.  Findings: An endotracheal tube is in place with tip 6.2 cm above the carina. A feeding tube is seen extending into the abdomen, with tip in the proximal stomach.  Lung volumes are lower limits of normal.  No definite focal consolidative airspace disease.  Trace bilateral pleural effusions.  Mild pulmonary venous congestion without frank pulmonary edema.  Minimal bibasilar linear opacities are favored to represent subsegmental atelectasis (slightly improved).  Heart  size is within normal limits. The patient is rotated to the left on today's exam, resulting in distortion of the mediastinal contours and reduced diagnostic sensitivity and specificity for mediastinal pathology.  Atherosclerotic calcifications within the arch of the aorta.  IMPRESSION: 1.  Support apparatus, as above. 2.  Slight improved aeration with decreasing bibasilar subsegmental atelectasis and decreasing trace bilateral pleural effusions. 3.  Atherosclerosis.  Original Report Authenticated By: Florencia Reasons, M.D.   Dg Chest Port 1 View  02/11/2012  *RADIOLOGY REPORT*  Clinical Data: Check ET tube position  PORTABLE CHEST - 1 VIEW  Comparison: 02/10/2012  Findings: Endotracheal tube terminates 4.5 cm above the carina. Weighted feeding tube terminates in the stomach.  Pulmonary vascular congestion without frank interstitial edema, improved.  Bibasilar opacities, likely atelectasis.  No pneumothorax.  The heart is normal in size.  IMPRESSION: Endotracheal tube terminates 4.5 cm above the carina.  Pulmonary vascular congestion without frank interstitial edema, improved.  Original Report Authenticated By: Charline Bills, M.D.    Medications:    I have reviewed the patient's current medications. Scheduled:   . amLODipine  5 mg Oral Daily  . antiseptic oral rinse  15 mL Mouth Rinse QID  . aspirin  81 mg Oral Daily  . atorvastatin  10 mg Oral Daily  . baclofen  10 mg Oral BID  . chlorhexidine  15 mL Mouth Rinse BID  . feeding supplement  30 mL Per Tube TID WC  . feeding supplement (PROMOTE)  1,000 mL Per Tube Q24H  . gabapentin  300 mg Oral QID  . heparin  5,000 Units Subcutaneous Q8H  . hydrALAZINE  25 mg Oral Q8H  . hydrochlorothiazide  12.5 mg Oral Daily  . insulin aspart  0-15 Units Subcutaneous Q4H  . insulin glargine  10 Units Subcutaneous Daily  . levothyroxine  75 mcg Oral QAC breakfast  . meropenem (MERREM) IV  500 mg Intravenous Q12H  . pantoprazole sodium  40 mg Per Tube QHS  . potassium chloride  40 mEq Per Tube Once  . sodium chloride  3 mL Intravenous Q12H  . valproate sodium  1,000 mg Intravenous Q12H  . vancomycin  1,500 mg Intravenous Q36H  . DISCONTD: insulin glargine  5 Units Subcutaneous Daily  . DISCONTD: levothyroxine  26 mcg Intravenous Daily  . DISCONTD: potassium chloride  40 mEq Oral Once  . DISCONTD: potassium chloride  40 mEq Oral Once  . DISCONTD: valproate sodium  500 mg Intravenous Q12H    Assessment/Plan:  Patient Active Hospital Problem List:  Altered mental status (02/03/2012)   Assessment: Patient slightly improved.  Remains on Depakote.  Level remains less than 10.     Plan:  1. Continue Depakote but increase to 1500mg  BID.  Level in AM.    2. EEG-have already spoken to technician Generalized weakness (02/06/2012)   Assessment: Patient with history of spinal cord infarct but it seems patient was much stronger prior to admission.  Although a critical care neuropathy is on the differential, patient has not been on the usual medications concomitantly that are usually associated.  Would rule out a higher cord event.      Plan: MRI of the cervical spine.      LOS: 10 days   Thana Farr, MD [facility] [phone] 02/12/2012  8:53 AM

## 2012-02-13 ENCOUNTER — Inpatient Hospital Stay (HOSPITAL_COMMUNITY): Payer: Medicare Other

## 2012-02-13 LAB — POCT I-STAT 3, ART BLOOD GAS (G3+)
Bicarbonate: 36.3 mEq/L — ABNORMAL HIGH (ref 20.0–24.0)
TCO2: 38 mmol/L (ref 0–100)
pCO2 arterial: 50.3 mmHg — ABNORMAL HIGH (ref 35.0–45.0)
pH, Arterial: 7.465 — ABNORMAL HIGH (ref 7.350–7.400)
pO2, Arterial: 83 mmHg (ref 80.0–100.0)

## 2012-02-13 LAB — CULTURE, BLOOD (ROUTINE X 2)
Culture  Setup Time: 201305191708
Culture: NO GROWTH
Culture: NO GROWTH

## 2012-02-13 LAB — BASIC METABOLIC PANEL
Calcium: 9.1 mg/dL (ref 8.4–10.5)
Creatinine, Ser: 2.36 mg/dL — ABNORMAL HIGH (ref 0.50–1.10)
GFR calc Af Amer: 23 mL/min — ABNORMAL LOW (ref >90)
GFR calc non Af Amer: 20 mL/min — ABNORMAL LOW (ref >90)

## 2012-02-13 LAB — CBC
MCH: 27.2 pg (ref 26.0–34.0)
MCHC: 31.9 g/dL (ref 30.0–36.0)
MCV: 85.2 fL (ref 78.0–100.0)
Platelets: 403 10*3/uL — ABNORMAL HIGH (ref 150–400)
RDW: 15.6 % — ABNORMAL HIGH (ref 11.5–15.5)

## 2012-02-13 LAB — GLUCOSE, CAPILLARY
Glucose-Capillary: 157 mg/dL — ABNORMAL HIGH (ref 70–99)
Glucose-Capillary: 151 mg/dL — ABNORMAL HIGH (ref 70–99)
Glucose-Capillary: 112 mg/dL — ABNORMAL HIGH (ref 70–99)

## 2012-02-13 MED ORDER — FREE WATER
250.0000 mL | Status: DC
  Administered 2012-02-13 – 2012-02-15 (×11): 250 mL

## 2012-02-13 MED ORDER — FOOD THICKENER (THICKENUP CLEAR)
ORAL | Status: DC | PRN
  Filled 2012-02-13: qty 120

## 2012-02-13 NOTE — Evaluation (Signed)
Clinical/Bedside Swallow Evaluation Patient Details  Name: Alexandra Wiley MRN: [id number] Date of Birth: 1945/11/06  Today's Date: 02/13/2012 Time:  -     Past Medical History:  Past Medical History  Diagnosis Date  . Ulcer   . Hypertension   . Heart murmur   . Gout   . PVD (peripheral vascular disease)   . Pain in limb   . CHF (congestive heart failure)   . 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 Conus medullaris syndrome   . Arthritis   . Breast cancer ~ 1975    right  . Carpal tunnel syndrome of right wrist   . High cholesterol   . Angina   . Myocardial infarction 1996  . Pneumonia ~ 2002; ~1975  . Type II diabetes mellitus   . Exertional dyspnea   . Hypothyroidism   . Seizures 02/02/12    "used to; a long time ago"  . Stroke 10/2008    "spinal cord"   Past Surgical History:  Past Surgical History  Procedure Date  . Foot tendon surgery 2000    Left foot  . Tubal ligation   . Femur fracture surgery 1960's    Left leg, repair of non-union femur fracture by Dr. Orland Jarred  . Breast lumpectomy ~1975    Right breast carcinoma in situ  . Tonsillectomy and adenoidectomy ~ 1961  . Dilation and curettage of uterus   . Fracture surgery   . 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 Seen by VVS and felt to have non-viable Rt lower extremity. Transferred to ICU 5/16 for low BP, acute encephalopathy, possible sepsis, and PCCM consulted. 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 Intubated 5/18 due to altered mental status from hyperkalemia, acidosis, and shock. Neurology consulted 5/18. ID consulted 5/20. Extubated on 5/25. PMHx CVA, Paraplegia form spinal cord infarct, DM, CAD, HTN, Gout, PVD, Hypothyroidism  BSE indicated following current extubation to assess risk for  aspiration.      Assessment / Plan / Recommendation Clinical Impression  Moderate oropharyngeal dysphagia secondary to lethargy and possibly due to cervical involvement.  Dysphagia characterized by delay in swallow with + s/s of aspiration moderately after swallow of thin water by cup possibly due to residuals vs. reflux. Recommend to proceed with  dysphagia 1 (puree) consistency  and nectar thick liquids sparingly  to maintain function of the swallow and administer only when patient is alert.  Recommend full supervision with strict aspiration precautions as aspiration risk remains high. ST to reassess swallow for possible diet upgrade on 02/14/12. Recommend to continue with temporary means of nutrition and hydration secondary to fluctuating LOA.     Aspiration Risk  Severe    Diet Recommendation Dysphagia 1 (Puree);Nectar-thick liquid   Other  Recommendations   Administer sparingly only when patient is full alert Upright position and for 30 minutes following Small bites/sips Cue patient to swallow x2 with each bite and sip Clear throat intermittently Oral care before and after PO intake  Follow Up Recommendations  Skilled Nursing facility    Frequency and Duration min 2x/week  2 weeks       SLP Swallow Goals Patient will consume recommended diet without observed clinical signs of aspiration with: Moderate  assistance Patient will utilize recommended strategies during swallow to increase swallowing safety with: Moderate assistance   Swallow Study Prior Functional Status   Consumed regular consistency and thin liquid without difficulty     General Date of Onset: 02/02/12 HPI: 66 yo female with h/o DM2 on insulin, 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    Type of Study: Bedside swallow evaluation Previous Swallow Assessment: no prior reports Diet Prior to this Study:  NPO Temperature Spikes Noted: No Respiratory Status: Supplemental O2 delivered via (comment) (nasal cannula) History of Intubation: Yes Date extubated: 02/13/12 Behavior/Cognition: Lethargic;Requires cueing Oral Cavity - Dentition: Poor condition;Missing dentition Vision: Impaired for self-feeding Patient Positioning: Upright in bed Baseline Vocal Quality: Breathy;Hoarse;Low vocal intensity Volitional Cough: Weak Volitional Swallow: Able to elicit    Oral/Motor/Sensory Function Overall Oral Motor/Sensory Function: Impaired Lingual Strength: Reduced Lingual Sensation: Reduced   Ice Chips Ice chips: Impaired Presentation: Spoon Pharyngeal Phase Impairments: Delayed Swallow;Decreased hyoid-laryngeal movement;Cough - Delayed   Thin Liquid Thin Liquid: Impaired Presentation: Cup;Spoon Pharyngeal  Phase Impairments: Delayed Swallow;Decreased hyoid-laryngeal movement;Cough - Delayed    Nectar Thick Nectar Thick Liquid: Impaired Presentation: Cup Pharyngeal Phase Impairments: Delayed Swallow;Decreased hyoid-laryngeal movement   Honey Thick Honey Thick Liquid: Not tested   Puree Puree: Impaired Pharyngeal Phase Impairments: Delayed Swallow;Decreased hyoid-laryngeal movement   Solid Solid: Not tested    Moreen Fowler MS, CCC-SLP [phone] [facility] 02/13/2012,4:32 PM

## 2012-02-13 NOTE — Procedures (Signed)
Extubated pt. Per MD order, placed on 3L [place], pt tolerated well.  HR 69, POx 100%, RR 18, BP 120/47 pt able to speak, RT to monitor.

## 2012-02-13 NOTE — Progress Notes (Signed)
Pt becomes very HYPOtensive when asleep. BP will decrease into the 60's ans 70' s when asleep but rebounds when she awakened, BP will increase to 1T's to 120's. Pt sometimes difficult to arouse from sleep,  but this no change from the previous night.

## 2012-02-13 NOTE — Progress Notes (Signed)
Family Medicine Teaching Service Attending Note  I discussed patient Alexandra Wiley  with Dr. Ree Edman and reviewed their note for today.  I agree with their assessment and plan.

## 2012-02-13 NOTE — Progress Notes (Signed)
Family Medicine Teaching Service [facility] Progress Note  Patient name: Alexandra Wiley Medical record number: [id number] Date of birth: 1946/05/15 Age: 66 y.o. Gender: female    LOS: 11 days   Primary Care Provider: Laurena Slimmer, MD, MD  Overnight Events:  Mental status improving and patient successfully extubated this AM!   Objective: Vital signs in last 24 hours: Temp:  [98.1 F (36.7 C)-99.2 F (37.3 C)] 98.2 F (36.8 C) (05/25 0441) Pulse Rate:  [55-112] 71  (05/25 0700) Resp:  [13-20] 15  (05/25 0700) BP: (67-163)/(34-107) 120/47 mmHg (05/25 0700) SpO2:  [99 %-100 %] 100 % (05/25 0700) FiO2 (%):  [39.6 %-40.4 %] 40 % (05/25 0700) Weight:  [178 lb 9.2 oz (81 kg)] 178 lb 9.2 oz (81 kg) (05/25 0500)  Wt Readings from Last 3 Encounters:  02/13/12 178 lb 9.2 oz (81 kg)  02/13/12 178 lb 9.2 oz (81 kg)  02/13/12 178 lb 9.2 oz (81 kg)     PE: Filed Vitals:   02/13/12 0441 02/13/12 0500 02/13/12 0600 02/13/12 0700  BP:  136/81 117/51 120/47  Pulse:  112 79 71  Temp: 98.2 F (36.8 C)     TempSrc: Oral     Resp:  15 16 15   Height:      Weight:  178 lb 9.2 oz (81 kg)    SpO2:  99% 100% 100%   Gen: awake, alert, oriented to place HEENT: moist mucous membranes, NG in place CV: S1S2, RRR, 3/6 systolic murmur unchanged from previous Res: coarse breath sounds from vent Abd: soft, non distended, present bowel sounds Ext/Musc: left heel elevated with protective bandage, right stump clean and dry.   Labs/Studies:  Basic Metabolic Panel:    Component Value Date/Time   NA 149* 02/13/2012 0443   K 3.6 02/13/2012 0443   CL 106 02/13/2012 0443   CO2 33* 02/13/2012 0443   BUN 72* 02/13/2012 0443   CREATININE 2.36* 02/13/2012 0443   GLUCOSE 160* 02/13/2012 0443   CALCIUM 9.1 02/13/2012 0443   CBC:    Component Value Date/Time   WBC 26.1* 02/13/2012 0443   HGB 9.2* 02/13/2012 0443   HCT 28.8* 02/13/2012 0443   PLT 403* 02/13/2012 0443   MCV 85.2 02/13/2012 0443   NEUTROABS  14.3* 02/11/2012 0415   LYMPHSABS 3.6 02/11/2012 0415   MONOABS 3.2* 02/11/2012 0415   EOSABS 0.0 02/11/2012 0415   BASOSABS 0.0 02/11/2012 0415    Chest X-ray: Endotracheal tube terminates 4.5 cm above the carina. Pulmonary vascular congestion without frank interstitial edema, improved.   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 Initially on CCM service due to concern for sepsis. 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 Was re-intubated on 5/18 because of unresponsiveness, decreased sensorium.   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 - on vanc (start 5/14) and meropenem (start 5/20) - cultures negative   # acute encephalopathy:  CCM managing. Patient intubated 5/18; ikely with metabolic encephalopathy, but follows commands. Neurology on board. Negative MRI, CT. Initial EEG showing moderate encephalopathy of nonspecific etiology. Repeat EEG showed confirmed encephalopathy of non specific etiology. There is question as to whether there is ongoing seizure activity. Neuro recommends following and appreciate input.  - Depakote to 1000mg  BID - improving and patient extubated  # Peripheral vascular disease:  - continue  to monitor left heal and foot  - follow up vascular surgery recommendations for potential revascularization of left leg in the future.   # Acute renal insufficiency: likely from hypovolemia and ATN. Stable.   # Acute Respiratory failure 2/2 acute encephalopathy: resolved s/p extubation 5/25  # Diabetes:   CBG (last 3)   Basename 02/13/12 0440 02/13/12 0015 02/12/12 1947  GLUCAP 157* 164* 165*   - novolog sliding scale coverage  - on Lantus 15 units   # h/o spinal cord infarct:  - continue home meds for spasms and pain: baclofen 10bid, gabapentin  300 qid   # diastolic CHF grade 2 diastolic dysfunction with EF of 60-65% on May 2012 echo  Elevated BNP at 9763.   - Start ASA 81mg  - Hold on bblocker due to occasional bradycardia - continue current therapy w/ amlodipine, hydralazine, and HCTZ  # h/o gout: hold allopurinol. No ss of flare.  # h/o HTN: elevated BP:  - on amlodipine 5, hctz 12.5 and hydralazine prn   #h/o hypothyroid:  - continue home levothyroxine  - normal TSH   PPx:  - heparin sub q  PPI   Fen/GI:  Has been getting tube feeds, will try for swallowing eval today  Dispo: currently on CCM's service. likely move to floor under family medicine teaching service on 5/26 - Pt is FULL CODE   LOS 11  Signed: Mat Carne, MD Family Medicine Resident PGY-1 [phone] 02/13/2012 8:26 AM

## 2012-02-13 NOTE — Progress Notes (Signed)
Name: Alexandra Wiley MRN: [id number] DOB: 1946/07/01    LOS: 11  [facility] / Critical Care Note   History of Present Illness:  66 yo female smoker admitted by [facility] on 02/02/2012 non-healing ulcer 1st Rt toe and heel.  Seen by [facility] and felt to have non-viable Rt lower extremity.  Transferred to ICU 5/16 for low BP, acute encephalopathy, possible sepsis, and PCCM consulted.  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  Intubated 5/18 due to altered mental status from hyperkalemia, acidosis, and shock.  Neurology consulted 5/18.  ID consulted 5/20. PMHx CVA, Paraplegia form spinal cord infarct, DM, CAD, HTN, Gout, PVD, Hypothyroidism  Tests / Events: 5/20- slow improvement neurostatus 5/20 imipenem started 5/22 Add depacon for possible seizures  Subjective: Follows simple commands. Tolerating PS.  More alert.  Vital Signs: Temp:  [98.1 F (36.7 C)-99.2 F (37.3 C)] 98.2 F (36.8 C) (05/25 0441) Pulse Rate:  [55-112] 71  (05/25 0700) Resp:  [13-20] 15  (05/25 0700) BP: (67-163)/(34-107) 120/47 mmHg (05/25 0700) SpO2:  [99 %-100 %] 100 % (05/25 0700) FiO2 (%):  [39.6 %-40.4 %] 40 % (05/25 0700) Weight:  [178 lb 9.2 oz (81 kg)] 178 lb 9.2 oz (81 kg) (05/25 0500) I/O last 3 completed shifts: In: 3730 [I.V.:290; Other:200; NG/GT:1960; IV Piggyback:1280] Out: 1990 [Urine:1990]  Physical Examination: General - no distress HEENT - ETT in place Cardiac - s1s2 regular Chest - clear  Abd - soft, + bowel sounds Ext - Rt AKA stump wound clean Neuro - more awake today, follows simple commands, moves upper extremities   CBC  Lab 02/13/12 0443 02/12/12 0420 02/11/12 0415  HGB 9.2* 8.7* 9.6*  HCT 28.8* 27.6* 29.0*  WBC 26.1* 21.7* 21.1*  PLT 403* 399 467*   BMET  Lab 02/13/12 0443 02/12/12 0420 02/11/12 0415 02/10/12 0355 02/09/12 1752  NA 149* 149* 148* 140 142  K 3.6 3.2* -- -- --  CL 106 108 108 103 104  CO2 33* 34* 31 28 28   GLUCOSE 160* 192* 192* 234* 164*  BUN 72* 60* 45* 32* 24*    CREATININE 2.36* 2.28* 2.04* 2.15* 2.11*  CALCIUM 9.1 8.8 9.3 8.8 8.8  MG -- -- -- -- --  PHOS -- -- -- -- --    Lab Results  Component Value Date   ALT 13 02/10/2012   AST 17 02/10/2012   ALKPHOS 90 02/10/2012   BILITOT 0.2* 02/10/2012     Mr Cervical Spine Wo Contrast  02/12/2012  *RADIOLOGY REPORT*  Clinical Data: History of spinal cord infarct  MRI CERVICAL SPINE WITHOUT CONTRAST  Technique:  Multiplanar and multiecho pulse sequences of the cervical spine, to include the craniocervical junction and cervicothoracic junction, were obtained according to standard protocol without intravenous contrast.  Comparison: No comparison MR of the cervical spine.  Findings: Cervical medullary junction unremarkable.  The cervical cord and upper thoracic cord are of normal signal without findings of increased signal to suggest infarct.  The patient is intubated.  Pooled secretions.  Vertebral arteries are patent.  C2-3:  Mild facet joint degenerative changes.  Mild bulge with tiny central protrusion.  No mass effect.  C3-4:  Facet joint degenerative changes greater on the left.  Very mild foraminal narrowing greater on the left.  Mild bulge.  C4-5:  Congenital fusion.  No spinal stenosis or foraminal narrowing.  C5-6:  Small to moderate sized broad-based protrusion left posterior lateral position with mild flattening of the left aspect of the cord.  Minimal  left foraminal narrowing.  C6-7:  Small to moderate broad-based disc protrusion slightly greater to the left with mass effect upon the ventral nerve roots and minimal left-sided cord contact.  Minimal left foraminal narrowing.  C7-T1:  Mild facet joint degenerative changes.  Mild bilateral foraminal narrowing greater on the left.  Sagittal imaging of the upper thoracic spine without significant spinal stenosis or foraminal narrowing.  IMPRESSION: No evidence of findings to suggest cervical cord infarct.  Cervical spondylotic changes most notable C5-6 followed  by the C6-7 level as detailed above.  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  Original Report Authenticated By: Fuller Canada, M.D.   Dg Chest Port 1 View  02/12/2012  *RADIOLOGY REPORT*  Clinical Data: Respiratory failure.  Evaluate endotracheal tube position.  PORTABLE CHEST - 1 VIEW  Comparison: Chest x-ray 02/11/2012.  Findings: An endotracheal tube is in place with tip 6.2 cm above the carina. A feeding tube is seen extending into the abdomen, with tip in the proximal stomach.  Lung volumes are lower limits of normal.  No definite focal consolidative airspace disease.  Trace bilateral pleural effusions.  Mild pulmonary venous congestion without frank pulmonary edema.  Minimal bibasilar linear opacities are favored to represent subsegmental atelectasis (slightly improved).  Heart size is within normal limits. The patient is rotated to the left on today's exam, resulting in distortion of the mediastinal contours and reduced diagnostic sensitivity and specificity for mediastinal pathology.  Atherosclerotic calcifications within the arch of the aorta.  IMPRESSION: 1.  Support apparatus, as above. 2.  Slight improved aeration with decreasing bibasilar subsegmental atelectasis and decreasing trace bilateral pleural effusions. 3.  Atherosclerosis.  Original Report Authenticated By: Florencia Reasons, M.D.     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  Has hx of smoking. Plan: -proceed with extubation 5/25 -f/u CXR -titrate oxygen to keep SpO2 > 92%  CARDIAC  5/15 Rt IJ TLC>>>5/22 5/16 Echo>>EF 60 to 65%, grade 2 diastolic dysfx, mild MR, mild/mod LA dilation  Chronic diastolic heart failure, CAD, HTN, hyperlipidemia.  Intermittent episodes of bradycardia, hypotension while asleep>>quickly resolves when awake. 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  Lt heel partial thickness loss. Plan: -post-op care per VVS  RENAL  5/21 Renal u/s>>no hydronephrosis  Acute renal failure 2nd to ATN in setting of sepsis, hypotension with hx of CVA.  Baseline creatinine 0.81 from 02/02/12.  Making urine. Plan: -keep in even fluid balance -monitor urine outpt, renal fx -d/c HCTZ  Hypernatremia Plan: -increase free water  NEURO  5/18 CT head>>no acute findings 5/18 MRI head>>negative for acute infarct.  Generalized atrophy. 5/20 EEG>>delta background with triphasic waves 5/22 EEG>>delta background with triphasic waves 5/24 EEG >>> 5/24 MRI C spine>>No evidence of findings to suggest cervical cord infarct. Cervical spondylotic changes most notable C5-6 followed by the C6-7.  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   Acute metabolic encephalopathy.  EEG shows triphasic waves>>LFT's normal.  Has acute renal failure, sepsis.  MRI head unremarkable.  Improved mental status since starting anti-sz meds. Plan: -depacon added 5/22 by neuro for possible seizure -f/u EEG 5/24  Paraplegia. Plan: -continue baclofen, neurotin  ID  5/14 MR Rt foot>>posterior calcaneal osteomyelitis with cellulitis and low level fasciitis 5/18 CT abd/pelvis>>no source of infection 5/20 MR Rt thigh>>no findings for osteo 5/24 MR C-spine >>>  Cultures: 5/15 BCx2>>> negative 5/15 UC>>>negative 5/19 BC>>> 5/19 Sputum>>negative  Antibiotics: 5/14 Vanco (osteo)>>> 5/14 Zosyn (oseto)>>>5/20 5/20 Imipenem>>>5/23 5/20 Meropenem>>  Sepsis for Rt lower extremity osteomyelitis.  No fever and clinically improving, but WBC increased. Plan: -ABx per ID>>change imipenem to meropenem 5/23 due to concern for seizures.  HEMATOLOGY  Anemia of critical illness, and renal failure. Plan: -f/u CBC -transfuse for Hb < 7  GI  Nutrition. Plan: -will need speech eval after extubation before advancing diet -keep Panda  with tube feeds until passes swallow eval  ENDOCRINE  CBG (last 3)   Basename 02/13/12 0440 02/13/12 0015 02/12/12 1947  GLUCAP 157* 164* 165*  DM type II. Plan: -SSI  -increase lantus to 15 units 5/24  Hypothyroidism. Plan: -continue synthroid  Hx of Gout.  Not active issue. -monitor clinically   BEST PRACTICE -Protonix for SUP -SQ heparin for DVT prophylaxis -Full code  Critical care time 35 minutes.  Coralyn Helling, MD 02/13/2012, 7:18 AM Pager:  [phone]

## 2012-02-13 NOTE — Progress Notes (Signed)
Subjective: Patient extubated this morning.  Mental status continuing to improve.  EEG performed on yesterday improved as well with less triphasic activity.  Remains on Depakote.   Objective: Current vital signs: BP 120/47  Pulse 71  Temp(Src) 98.4 F (36.9 C) (Oral)  Resp 15  Ht 4\' 11"  (1.499 m)  Wt 81 kg (178 lb 9.2 oz)  BMI 36.07 kg/m2  SpO2 100% Vital signs in last 24 hours: Temp:  [98.1 F (36.7 C)-99.2 F (37.3 C)] 98.4 F (36.9 C) (05/25 0828) Pulse Rate:  [55-112] 71  (05/25 0700) Resp:  [13-20] 15  (05/25 0700) BP: (67-163)/(34-107) 120/47 mmHg (05/25 0700) SpO2:  [99 %-100 %] 100 % (05/25 0700) FiO2 (%):  [39.6 %-40.4 %] 40 % (05/25 0700) Weight:  [81 kg (178 lb 9.2 oz)] 81 kg (178 lb 9.2 oz) (05/25 0500)  Intake/Output from previous day: 05/24 0701 - 05/25 0700 In: 2110 [I.V.:200; NG/GT:1480; IV Piggyback:230] Out: 1585 [Urine:1585] Intake/Output this shift:   Nutritional status: NPO  Neurologic Exam: Mental Status:  Eyes open.  Making one word responses to questions being asked.  Follows commands.  Speech slurred. Cranial Nerves:  II: Pupils reactive.  Blinks to bilateral confrontation III,IV, VI: ptosis not present, extra-ocular motions intact bilaterally with patient tracking as I move around the room.  V,VII: Moves face symmetrically  VIII: Grossly intact  IX,X: Unable to test  XI: Unable to test  XII: tongue strength normal  Motor:  Patient lifts both arms off the bed to command to about 90 degrees.  Lifts left leg minimally.   Tone and bulk:normal tone throughout; no atrophy noted  DTR's:  Trace in the upper extremities and absent in the left lower extemity  Plantars:  Right: Amputation    Left: mute  Cerebellar:  Unable to perform   Lab Results: Results for orders placed during the hospital encounter of 02/02/12 (from the past 48 hour(s))  GLUCOSE, CAPILLARY     Status: Abnormal   Collection Time   02/11/12 11:16 AM      Component Value  Range Comment   Glucose-Capillary 182 (*) 70 - 99 (mg/dL)   GLUCOSE, CAPILLARY     Status: Abnormal   Collection Time   02/11/12  3:55 PM      Component Value Range Comment   Glucose-Capillary 199 (*) 70 - 99 (mg/dL)   GLUCOSE, CAPILLARY     Status: Abnormal   Collection Time   02/11/12  8:11 PM      Component Value Range Comment   Glucose-Capillary 212 (*) 70 - 99 (mg/dL)   GLUCOSE, CAPILLARY     Status: Abnormal   Collection Time   02/12/12 12:21 AM      Component Value Range Comment   Glucose-Capillary 157 (*) 70 - 99 (mg/dL)    Comment 1 Documented in Chart      Comment 2 Notify RN     BASIC METABOLIC PANEL     Status: Abnormal   Collection Time   02/12/12  4:20 AM      Component Value Range Comment   Sodium 149 (*) 135 - 145 (mEq/L)    Potassium 3.2 (*) 3.5 - 5.1 (mEq/L)    Chloride 108  96 - 112 (mEq/L)    CO2 34 (*) 19 - 32 (mEq/L)    Glucose, Bld 192 (*) 70 - 99 (mg/dL)    BUN 60 (*) 6 - 23 (mg/dL)    Creatinine, Ser 4.54 (*) 0.50 - 1.10 (mg/dL)  Calcium 8.8  8.4 - 10.5 (mg/dL)    GFR calc non Af Amer 21 (*) >90 (mL/min)    GFR calc Af Amer 24 (*) >90 (mL/min)   CBC     Status: Abnormal   Collection Time   02/12/12  4:20 AM      Component Value Range Comment   WBC 21.7 (*) 4.0 - 10.5 (K/uL)    RBC 3.30 (*) 3.87 - 5.11 (MIL/uL)    Hemoglobin 8.7 (*) 12.0 - 15.0 (g/dL)    HCT 16.1 (*) 09.6 - 46.0 (%)    MCV 83.6  78.0 - 100.0 (fL)    MCH 26.4  26.0 - 34.0 (pg)    MCHC 31.5  30.0 - 36.0 (g/dL)    RDW 04.5  40.9 - 81.1 (%)    Platelets 399  150 - 400 (K/uL)   VALPROIC ACID LEVEL     Status: Abnormal   Collection Time   02/12/12  4:20 AM      Component Value Range Comment   Valproic Acid Lvl <10.0 (*) 50.0 - 100.0 (ug/mL)   GLUCOSE, CAPILLARY     Status: Abnormal   Collection Time   02/12/12  4:31 AM      Component Value Range Comment   Glucose-Capillary 162 (*) 70 - 99 (mg/dL)    Comment 1 Notify RN      Comment 2 Documented in Chart     GLUCOSE, CAPILLARY      Status: Abnormal   Collection Time   02/12/12  7:37 AM      Component Value Range Comment   Glucose-Capillary 136 (*) 70 - 99 (mg/dL)   CLOSTRIDIUM DIFFICILE BY PCR     Status: Normal   Collection Time   02/12/12 10:31 AM      Component Value Range Comment   C difficile by pcr NEGATIVE  NEGATIVE    GLUCOSE, CAPILLARY     Status: Abnormal   Collection Time   02/12/12 12:26 PM      Component Value Range Comment   Glucose-Capillary 159 (*) 70 - 99 (mg/dL)   GLUCOSE, CAPILLARY     Status: Abnormal   Collection Time   02/12/12  4:47 PM      Component Value Range Comment   Glucose-Capillary 126 (*) 70 - 99 (mg/dL)   GLUCOSE, CAPILLARY     Status: Abnormal   Collection Time   02/12/12  7:47 PM      Component Value Range Comment   Glucose-Capillary 165 (*) 70 - 99 (mg/dL)   GLUCOSE, CAPILLARY     Status: Abnormal   Collection Time   02/13/12 12:15 AM      Component Value Range Comment   Glucose-Capillary 164 (*) 70 - 99 (mg/dL)    Comment 1 Documented in Chart      Comment 2 Notify RN     GLUCOSE, CAPILLARY     Status: Abnormal   Collection Time   02/13/12  4:40 AM      Component Value Range Comment   Glucose-Capillary 157 (*) 70 - 99 (mg/dL)    Comment 1 Documented in Chart      Comment 2 Notify RN     VALPROIC ACID LEVEL     Status: Abnormal   Collection Time   02/13/12  4:43 AM      Component Value Range Comment   Valproic Acid Lvl 20.3 (*) 50.0 - 100.0 (ug/mL)   CBC     Status: Abnormal  Collection Time   02/13/12  4:43 AM      Component Value Range Comment   WBC 26.1 (*) 4.0 - 10.5 (K/uL)    RBC 3.38 (*) 3.87 - 5.11 (MIL/uL)    Hemoglobin 9.2 (*) 12.0 - 15.0 (g/dL)    HCT 45.4 (*) 09.8 - 46.0 (%)    MCV 85.2  78.0 - 100.0 (fL)    MCH 27.2  26.0 - 34.0 (pg)    MCHC 31.9  30.0 - 36.0 (g/dL)    RDW 11.9 (*) 14.7 - 15.5 (%)    Platelets 403 (*) 150 - 400 (K/uL)   BASIC METABOLIC PANEL     Status: Abnormal   Collection Time   02/13/12  4:43 AM      Component Value Range  Comment   Sodium 149 (*) 135 - 145 (mEq/L)    Potassium 3.6  3.5 - 5.1 (mEq/L)    Chloride 106  96 - 112 (mEq/L)    CO2 33 (*) 19 - 32 (mEq/L)    Glucose, Bld 160 (*) 70 - 99 (mg/dL)    BUN 72 (*) 6 - 23 (mg/dL)    Creatinine, Ser 8.29 (*) 0.50 - 1.10 (mg/dL)    Calcium 9.1  8.4 - 10.5 (mg/dL)    GFR calc non Af Amer 20 (*) >90 (mL/min)    GFR calc Af Amer 23 (*) >90 (mL/min)   POCT I-STAT 3, BLOOD GAS (G3+)     Status: Abnormal   Collection Time   02/13/12  5:05 AM      Component Value Range Comment   pH, Arterial 7.465 (*) 7.350 - 7.400     pCO2 arterial 50.3 (*) 35.0 - 45.0 (mmHg)    pO2, Arterial 83.0  80.0 - 100.0 (mmHg)    Bicarbonate 36.3 (*) 20.0 - 24.0 (mEq/L)    TCO2 38  0 - 100 (mmol/L)    O2 Saturation 97.0      Acid-Base Excess 11.0 (*) 0.0 - 2.0 (mmol/L)    Patient temperature 98.2 F      Collection site RADIAL, ALLEN'S TEST ACCEPTABLE      Drawn by Operator      Sample type ARTERIAL     GLUCOSE, CAPILLARY     Status: Abnormal   Collection Time   02/13/12  8:21 AM      Component Value Range Comment   Glucose-Capillary 151 (*) 70 - 99 (mg/dL)     Recent Results (from the past 240 hour(s))  MRSA PCR SCREENING     Status: Normal   Collection Time   02/03/12  6:10 PM      Component Value Range Status Comment   MRSA by PCR NEGATIVE  NEGATIVE  Final   CULTURE, BLOOD (ROUTINE X 2)     Status: Normal   Collection Time   02/03/12  6:15 PM      Component Value Range Status Comment   Specimen Description BLOOD CENTRAL LINE   Final    Special Requests     Final    Value: BOTTLES DRAWN AEROBIC AND ANAEROBIC 10CC LEFT IJ CVC   Culture  Setup Time [id number]   Final    Culture NO GROWTH 5 DAYS   Final    Report Status 02/10/2012 FINAL   Final   URINE CULTURE     Status: Normal   Collection Time   02/03/12  6:20 PM      Component Value Range Status Comment   Specimen  Description URINE, CATHETERIZED   Final    Special Requests NONE   Final    Culture  Setup Time  [id number]   Final    Colony Count NO GROWTH   Final    Culture NO GROWTH   Final    Report Status 02/05/2012 FINAL   Final   CULTURE, BLOOD (ROUTINE X 2)     Status: Normal   Collection Time   02/03/12  7:58 PM      Component Value Range Status Comment   Specimen Description BLOOD HAND RIGHT   Final    Special Requests BOTTLES DRAWN AEROBIC ONLY 3CC   Final    Culture  Setup Time [id number]   Final    Culture NO GROWTH 5 DAYS   Final    Report Status 02/10/2012 FINAL   Final   CULTURE, BLOOD (ROUTINE X 2)     Status: Normal (Preliminary result)   Collection Time   02/07/12 11:42 AM      Component Value Range Status Comment   Specimen Description BLOOD RIGHT HAND   Final    Special Requests BOTTLES DRAWN AEROBIC ONLY 3CC   Final    Culture  Setup Time [id number]   Final    Culture     Final    Value:        BLOOD CULTURE RECEIVED NO GROWTH TO DATE CULTURE WILL BE HELD FOR 5 DAYS BEFORE ISSUING A FINAL NEGATIVE REPORT   Report Status PENDING   Incomplete   CULTURE, BLOOD (ROUTINE X 2)     Status: Normal (Preliminary result)   Collection Time   02/07/12  1:37 PM      Component Value Range Status Comment   Specimen Description BLOOD LEFT HAND   Final    Special Requests BOTTLES DRAWN AEROBIC AND ANAEROBIC 3CC   Final    Culture  Setup Time [id number]   Final    Culture     Final    Value:        BLOOD CULTURE RECEIVED NO GROWTH TO DATE CULTURE WILL BE HELD FOR 5 DAYS BEFORE ISSUING A FINAL NEGATIVE REPORT   Report Status PENDING   Incomplete   CULTURE, RESPIRATORY     Status: Normal   Collection Time   02/07/12  2:22 PM      Component Value Range Status Comment   Specimen Description TRACHEAL ASPIRATE   Final    Special Requests NONE   Final    Gram Stain     Final    Value: FEW WBC PRESENT, PREDOMINANTLY PMN     NO SQUAMOUS EPITHELIAL CELLS SEEN     NO ORGANISMS SEEN   Culture Non-Pathogenic Oropharyngeal-type Flora Isolated.   Final    Report Status 02/10/2012 FINAL    Final   CLOSTRIDIUM DIFFICILE BY PCR     Status: Normal   Collection Time   02/12/12 10:31 AM      Component Value Range Status Comment   C difficile by pcr NEGATIVE  NEGATIVE  Final     Lipid Panel No results found for this basename: CHOL,TRIG,HDL,CHOLHDL,VLDL,LDLCALC in the last 72 hours  Studies/Results: Mr Cervical Spine Wo Contrast  02/12/2012  *RADIOLOGY REPORT*  Clinical Data: History of spinal cord infarct  MRI CERVICAL SPINE WITHOUT CONTRAST  Technique:  Multiplanar and multiecho pulse sequences of the cervical spine, to include the craniocervical junction and cervicothoracic junction, were obtained according to standard protocol without intravenous contrast.  Comparison: No  comparison MR of the cervical spine.  Findings: Cervical medullary junction unremarkable.  The cervical cord and upper thoracic cord are of normal signal without findings of increased signal to suggest infarct.  The patient is intubated.  Pooled secretions.  Vertebral arteries are patent.  C2-3:  Mild facet joint degenerative changes.  Mild bulge with tiny central protrusion.  No mass effect.  C3-4:  Facet joint degenerative changes greater on the left.  Very mild foraminal narrowing greater on the left.  Mild bulge.  C4-5:  Congenital fusion.  No spinal stenosis or foraminal narrowing.  C5-6:  Small to moderate sized broad-based protrusion left posterior lateral position with mild flattening of the left aspect of the cord.  Minimal left foraminal narrowing.  C6-7:  Small to moderate broad-based disc protrusion slightly greater to the left with mass effect upon the ventral nerve roots and minimal left-sided cord contact.  Minimal left foraminal narrowing.  C7-T1:  Mild facet joint degenerative changes.  Mild bilateral foraminal narrowing greater on the left.  Sagittal imaging of the upper thoracic spine without significant spinal stenosis or foraminal narrowing.  IMPRESSION: No evidence of findings to suggest cervical cord  infarct.  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  Original Report Authenticated By: Fuller Canada, M.D.   Dg Chest Port 1 View  02/12/2012  *RADIOLOGY REPORT*  Clinical Data: Respiratory failure.  Evaluate endotracheal tube position.  PORTABLE CHEST - 1 VIEW  Comparison: Chest x-ray 02/11/2012.  Findings: An endotracheal tube is in place with tip 6.2 cm above the carina. A feeding tube is seen extending into the abdomen, with tip in the proximal stomach.  Lung volumes are lower limits of normal.  No definite focal consolidative airspace disease.  Trace bilateral pleural effusions.  Mild pulmonary venous congestion without frank pulmonary edema.  Minimal bibasilar linear opacities are favored to represent subsegmental atelectasis (slightly improved).  Heart size is within normal limits. The patient is rotated to the left on today's exam, resulting in distortion of the mediastinal contours and reduced diagnostic sensitivity and specificity for mediastinal pathology.  Atherosclerotic calcifications within the arch of the aorta.  IMPRESSION: 1.  Support apparatus, as above. 2.  Slight improved aeration with decreasing bibasilar subsegmental atelectasis and decreasing trace bilateral pleural effusions. 3.  Atherosclerosis.  Original Report Authenticated By: Florencia Reasons, M.D.    Medications:  I have reviewed the patient's current medications. Scheduled:   . amLODipine  5 mg Oral Daily  . antiseptic oral rinse  15 mL Mouth Rinse QID  . aspirin  81 mg Oral Daily  . atorvastatin  10 mg Oral Daily  . baclofen  10 mg Oral BID  . chlorhexidine  15 mL Mouth Rinse BID  . feeding supplement  30 mL Per Tube TID WC  . feeding supplement (PROMOTE)  1,000 mL Per Tube Q24H  . free water  250 mL Per Tube Q4H  . gabapentin  300 mg Oral QID  . heparin  5,000 Units Subcutaneous Q8H  . hydrALAZINE  25 mg Oral Q8H  . insulin aspart   0-15 Units Subcutaneous Q4H  . insulin glargine  15 Units Subcutaneous Daily  . levothyroxine  75 mcg Oral QAC breakfast  . meropenem (MERREM) IV  500 mg Intravenous Q12H  . pantoprazole sodium  40 mg Per Tube QHS  . sodium chloride  3 mL Intravenous Q12H  . valproate sodium  1,500 mg  Intravenous Q12H  . vancomycin  1,500 mg Intravenous Q36H  . DISCONTD: free water  250 mL Per Tube Q6H  . DISCONTD: hydrochlorothiazide  12.5 mg Oral Daily  . DISCONTD: insulin glargine  10 Units Subcutaneous Daily  . DISCONTD: valproate sodium  1,000 mg Intravenous Q12H    Assessment/Plan:  Patient Active Hospital Problem List: Altered mental status (02/03/2012)   Assessment: Mental status improving slowly.  EEG improved as well.  Unclear at this point if both are improving because patient is medically improving or if Depakote may be helping.  Depakote level 20.3 today.      Plan:  1.  Would continue Depakote at current dose and change to po once patient able to take po.  As patient comes clearer to baseline will make a decision as to when to consider its discontinuation.   Generalized weakness   Assessment: MRI of the cervical spine performed and although there is evidence of disc and degenerative joint disease there is no significant cord impingement nor is there evidence of cord pathology.  Clinically patient is improved today and is able to lift her upper extremities against gravity.     Plan:  PT   LOS: 11 days   Thana Farr, MD [facility] [phone] 02/13/2012  8:58 AM

## 2012-02-14 ENCOUNTER — Inpatient Hospital Stay (HOSPITAL_COMMUNITY): Payer: Medicare Other

## 2012-02-14 LAB — CBC
HCT: 28.1 % — ABNORMAL LOW (ref 36.0–46.0)
Hemoglobin: 8.7 g/dL — ABNORMAL LOW (ref 12.0–15.0)
MCH: 26 pg (ref 26.0–34.0)
MCV: 83.9 fL (ref 78.0–100.0)
RBC: 3.35 MIL/uL — ABNORMAL LOW (ref 3.87–5.11)
WBC: 21.5 10*3/uL — ABNORMAL HIGH (ref 4.0–10.5)

## 2012-02-14 LAB — BASIC METABOLIC PANEL
CO2: 34 mEq/L — ABNORMAL HIGH (ref 19–32)
Chloride: 101 mEq/L (ref 96–112)
Glucose, Bld: 155 mg/dL — ABNORMAL HIGH (ref 70–99)
Potassium: 3.3 mEq/L — ABNORMAL LOW (ref 3.5–5.1)
Sodium: 142 mEq/L (ref 135–145)

## 2012-02-14 LAB — GLUCOSE, CAPILLARY
Glucose-Capillary: 120 mg/dL — ABNORMAL HIGH (ref 70–99)
Glucose-Capillary: 150 mg/dL — ABNORMAL HIGH (ref 70–99)
Glucose-Capillary: 151 mg/dL — ABNORMAL HIGH (ref 70–99)
Glucose-Capillary: 176 mg/dL — ABNORMAL HIGH (ref 70–99)

## 2012-02-14 MED ORDER — FLEET ENEMA 7-19 GM/118ML RE ENEM
1.0000 | ENEMA | Freq: Every day | RECTAL | Status: DC | PRN
  Administered 2012-02-14: 1 via RECTAL
  Filled 2012-02-14 (×3): qty 1

## 2012-02-14 MED ORDER — FENTANYL CITRATE 0.05 MG/ML IJ SOLN: 25.0000 ug | INTRAMUSCULAR | Status: DC | PRN

## 2012-02-14 MED ORDER — ONDANSETRON HCL 4 MG/2ML IJ SOLN
4.0000 mg | Freq: Four times a day (QID) | INTRAMUSCULAR | Status: DC | PRN
  Administered 2012-02-14: 4 mg via INTRAVENOUS

## 2012-02-14 MED ORDER — ONDANSETRON HCL 4 MG/2ML IJ SOLN
INTRAMUSCULAR | Status: AC
  Filled 2012-02-14: qty 2

## 2012-02-14 NOTE — Progress Notes (Signed)
Family Medicine Teaching Service [facility] Progress Note  Patient name: Alexandra Wiley Medical record number: [id number] Date of birth: [birth year] Age: 66 y.o. Gender: female    LOS: 12 days   Primary Care Provider: Laurena Slimmer, MD, MD  Overnight Events:  Extubated since yesterday. Has been alert and oriented but is not acting like she used to prior to this, per family. She tried suctioning the back of her throat with the suction probe and was put on soft restraints.   Objective: Vital signs in last 24 hours: Temp:  [97.6 F (36.4 C)-98.4 F (36.9 C)] 97.9 F (36.6 C) (05/26 0400) Pulse Rate:  [55-98] 64  (05/26 0310) Resp:  [11-16] 13  (05/26 0310) BP: (72-175)/(37-62) 135/46 mmHg (05/26 0310) SpO2:  [95 %-100 %] 100 % (05/26 0310) FiO2 (%):  [2 %-40 %] 2 % (05/26 0200)  Wt Readings from Last 3 Encounters:  02/13/12 178 lb 9.2 oz (81 kg)  02/13/12 178 lb 9.2 oz (81 kg)  02/13/12 178 lb 9.2 oz (81 kg)     PE: Filed Vitals:   02/14/12 0200 02/14/12 0250 02/14/12 0310 02/14/12 0400  BP: 82/41 88/47 135/46   Pulse: 55 55 64   Temp:    97.9 F (36.6 C)  TempSrc:    Oral  Resp: 11 11 13    Height:      Weight:      SpO2: 100% 100% 100%    Gen: awake, alert and oriented to person, place. Thought it was 2012 but could say who the president is.  HEENT: moist mucous membranes, NG in place CV: S1S2, RRR, 3/6 systolic murmur unchanged from previous Res: cta b/l anteriorly Abd: soft, non distended, present bowel sounds Ext/Musc: left heel elevated with protective bandage, right stump clean and dry. No drainage or erythema. Staples in place.   Labs/Studies:  Basic Metabolic Panel:    Component Value Date/Time   NA 142 02/14/2012 0330   K 3.3* 02/14/2012 0330   CL 101 02/14/2012 0330   CO2 34* 02/14/2012 0330   BUN 69* 02/14/2012 0330   CREATININE 2.06* 02/14/2012 0330   GLUCOSE 155* 02/14/2012 0330   CALCIUM 8.9 02/14/2012 0330   CBC:    Component Value Date/Time   WBC 21.5* 02/14/2012 0330   HGB 8.7* 02/14/2012 0330   HCT 28.1* 02/14/2012 0330   PLT 352 02/14/2012 0330   MCV 83.9 02/14/2012 0330   NEUTROABS 14.3* 02/11/2012 0415   LYMPHSABS 3.6 02/11/2012 0415   MONOABS 3.2* 02/11/2012 0415   EOSABS 0.0 02/11/2012 0415   BASOSABS 0.0 02/11/2012 0415    Chest X-ray: Endotracheal tube terminates 4.5 cm above the carina. Pulmonary vascular congestion without frank interstitial edema, improved.   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 On CCM service due to concern for sepsis. 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 Was re-intubated on 5/18 because of unresponsiveness, decreased sensorium and extubated on 02/13/12.   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 -blood cultures neg x5days.  - sputum cultures negative - on vanc (start 5/14) and meropenem (start 5/20) - wbc at 21.5 from 26.1 yesterday  # acute encephalopathy:   Patient intubated 5/18; likely with metabolic encephalopathy. Was extubated on 05/25 Neurology on board. Negative MRI, CT. Initial EEG showing moderate encephalopathy of nonspecific etiology.  Latest EEG showed  improvement compared to previous.  - Depakote at 1500mg  bid IV with level at 20.3. Per neuro, consider switching to po when able to swallow.   # Peripheral vascular disease:  - continue to monitor left heal and foot  - follow up vascular surgery recommendations for potential revascularization of left leg in the future.   # Acute renal insufficiency: likely from hypovolemia and ATN. Stable at 2.06  # Acute Respiratory failure 2/2 acute encephalopathy: resolved s/p extubation 5/25. Currently on 2L [place]  # Diabetes:   CBG (last 3)   Basename 02/14/12 0342 02/14/12 0007 02/13/12 1949  GLUCAP 150* 151* 189*     - novolog sliding scale coverage  - on Lantus 15 units   # h/o spinal cord infarct:  - continue home meds for spasms and pain: baclofen 10bid, gabapentin 300 qid  - MRI from 05/24 did not show any evidence of spinal cord infarct. 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   # diastolic CHF grade 2 diastolic dysfunction with EF of 60-65% on May 2012 echo  Elevated BNP at 9763.   -  ASA 81mg  - Hold on bblocker due to occasional bradycardia - continue current therapy w/ amlodipine, hydralazine, and HCTZ  # h/o gout: hold allopurinol. No ss of flare.  # h/o HTN: has fluctuating BP depending on which arm is used for measurement. HCTZ held yesterday for concern of hypotension. - on amlodipine 5 and hydralazine prn   #h/o hypothyroid:  - continue home levothyroxine  - normal TSH   PPx:  - heparin sub q  PPI   Fen/GI:  Has been getting tube feeds but had speech eval that recommends dysphagia 1 diet, thick nectar. To be reevaluated today.   Dispo: CCM signing off today. Will be on FMTS service starting tomorrow. - Pt is FULL CODE   LOS 12  Signed: Marena Chancy, MD Family Medicine Resident PGY-1 [phone] 02/14/2012 6:19 AM

## 2012-02-14 NOTE — Progress Notes (Signed)
Speech Language Pathology Dysphagia Treatment Patient Details Name: Alexandra Wiley MRN: [id number] DOB: [birth year] Today's Date: 02/14/2012 Time: 4098-1191 SLP Time Calculation (min): 33 min  Assessment / Plan / Recommendation Clinical Impression  Purpose of treatment for diet tolerance following initial BSE completed on 5/25. Patient with increased LOA this treatment  but more confused. Recommendations from BSE to initiate dysphagia 1 (puree) and NTL sparingly  to maintain swallow function. RN reports no outward s/s of aspiration however patient refused am meal due to nausea and patient with increased attempts  to self suction secretions with oral suction. Patient observed with trial thin water with noted delayed cough and slight wet vocal quality. Slight  increase in RR s/p swallow.  Recommend objective assessment before initiating PO diet due to risk factors for aspiration.  Recommend the following:   NPO  May have ice chips sparingly after oral care for comfort if MD agrees Continue temporary means of nutrition/hydration  ST to reassess on 5/27 for MBS vs. FEES readiness      Continue with Current Diet: NPO    SLP Plan New goals to be determined pending objective testing      Swallowing Goals  SLP Swallowing Goals Swallow Study Goal #1 - Progress: Not Met Swallow Study Goal #2 - Progress: Not met  General Temperature Spikes Noted: No Respiratory Status: Supplemental O2 delivered via (comment) (nasal cannula) Behavior/Cognition: Confused;Lethargic;Requires cueing Oral Cavity - Dentition: Dentures, top;Dentures, bottom Patient Positioning: Upright in bed     Dysphagia Treatment Treatment focused on: Skilled observation of diet tolerance;Upgraded PO texture trials;Patient/family/caregiver education;Facilitation of pharyngeal phase Family/Caregiver Educated: Son Treatment Methods/Modalities: Skilled observation;Differential diagnosis Patient observed directly with PO's:  Yes Feeding: Needs assist Liquids provided via: Teaspoon;Cup Pharyngeal Phase Signs & Symptoms: Delayed cough Type of cueing: Verbal;Visual Amount of cueing: Moderate  Moreen Fowler MS, CCC-SLP [phone] [facility] 02/14/2012, 12:14 PM

## 2012-02-14 NOTE — Progress Notes (Signed)
Family Medicine Teaching Service Attending Note  I discussed patient Alexandra Wiley  with Dr. Losq and reviewed their note for today.  I agree with their assessment and plan.      

## 2012-02-14 NOTE — Progress Notes (Signed)
Name: Alexandra Wiley MRN: [id number] DOB: [birth year]    LOS: 12  [facility] Critical Care Note   History of Present Illness:  66 yo female smoker admitted by FPTS on 02/02/2012 non-healing ulcer 1st Rt toe and heel.  Seen by VVS and felt to have non-viable Rt lower extremity.  Transferred to ICU 5/16 for low BP, acute encephalopathy, possible sepsis, and PCCM consulted.  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  Intubated 5/18 due to altered mental status from hyperkalemia, acidosis, and shock.  Neurology consulted 5/18.  ID consulted 5/20. PMHx CVA, Paraplegia form spinal cord infarct, DM, CAD, HTN, Gout, PVD, Hypothyroidism  Tests / Events: 5/20- slow improvement neurostatus 5/20 imipenem started 5/22 Add depacon for possible seizures 5/25 Extubated  Subjective: Still confused.  Asking for Ice chips.  Follows simple commands.  Vital Signs: Temp:  [97.6 F (36.4 C)-98.4 F (36.9 C)] 98.2 F (36.8 C) (05/26 0800) Pulse Rate:  [55-98] 73  (05/26 0600) Resp:  [11-16] 14  (05/26 0600) BP: (72-175)/(37-72) 126/58 mmHg (05/26 0800) SpO2:  [95 %-100 %] 100 % (05/26 0600) FiO2 (%):  [2 %-3 %] 2 % (05/26 0200) Weight:  [173 lb 11.6 oz (78.8 kg)] 173 lb 11.6 oz (78.8 kg) (05/26 0500) I/O last 3 completed shifts: In: 4335 [P.O.:480; I.V.:310; NG/GT:2585; IV Piggyback:960] Out: 2835 [Urine:2535; Stool:300]  Physical Examination: General - no distress HEENT - Panda in place Cardiac - s1s2 regular with 2/6 SM Chest - clear  Abd - soft, + bowel sounds Ext - Rt AKA stump wound clean, Lt foot cool Neuro - agitated at times   CBC  Lab 02/14/12 0330 02/13/12 0443 02/12/12 0420  HGB 8.7* 9.2* 8.7*  HCT 28.1* 28.8* 27.6*  WBC 21.5* 26.1* 21.7*  PLT 352 403* 399   BMET  Lab 02/14/12 0330 02/13/12 0443 02/12/12 0420 02/11/12 0415 02/10/12 0355  NA 142 149* 149* 148* 140  K 3.3* 3.6 -- -- --  CL 101 106 108 108 103  CO2 34* 33* 34* 31 28  GLUCOSE 155* 160* 192* 192* 234*  BUN 69* 72* 60* 45*  32*  CREATININE 2.06* 2.36* 2.28* 2.04* 2.15*  CALCIUM 8.9 9.1 8.8 9.3 8.8  MG -- -- -- -- --  PHOS -- -- -- -- --    Lab Results  Component Value Date   ALT 13 02/10/2012   AST 17 02/10/2012   ALKPHOS 90 02/10/2012   BILITOT 0.2* 02/10/2012     Mr Cervical Spine Wo Contrast  02/12/2012  *RADIOLOGY REPORT*  Clinical Data: History of spinal cord infarct  MRI CERVICAL SPINE WITHOUT CONTRAST  Technique:  Multiplanar and multiecho pulse sequences of the cervical spine, to include the craniocervical junction and cervicothoracic junction, were obtained according to standard protocol without intravenous contrast.  Comparison: No comparison MR of the cervical spine.  Findings: Cervical medullary junction unremarkable.  The cervical cord and upper thoracic cord are of normal signal without findings of increased signal to suggest infarct.  The patient is intubated.  Pooled secretions.  Vertebral arteries are patent.  C2-3:  Mild facet joint degenerative changes.  Mild bulge with tiny central protrusion.  No mass effect.  C3-4:  Facet joint degenerative changes greater on the left.  Very mild foraminal narrowing greater on the left.  Mild bulge.  C4-5:  Congenital fusion.  No spinal stenosis or foraminal narrowing.  C5-6:  Small to moderate sized broad-based protrusion left posterior lateral position with mild flattening of the left aspect  of the cord.  Minimal left foraminal narrowing.  C6-7:  Small to moderate broad-based disc protrusion slightly greater to the left with mass effect upon the ventral nerve roots and minimal left-sided cord contact.  Minimal left foraminal narrowing.  C7-T1:  Mild facet joint degenerative changes.  Mild bilateral foraminal narrowing greater on the left.  Sagittal imaging of the upper thoracic spine without significant spinal stenosis or foraminal narrowing.  IMPRESSION: No evidence of findings to suggest cervical cord infarct.  Cervical spondylotic changes most notable C5-6  followed by the C6-7 level as detailed above.  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  Original Report Authenticated By: Fuller Canada, M.D.   Dg Chest Port 1 View  02/13/2012  *RADIOLOGY REPORT*  Clinical Data: Endotracheal tube  PORTABLE CHEST - 1 VIEW  Comparison: Yesterday  Findings: Moderate cardiomegaly.  Vascular congestion.  Increasing basilar atelectasis.  No pneumothorax.  Endotracheal tube is stable.  Feeding tube remains with its tip in the fundus of the stomach.  IMPRESSION: Increasing vascular congestion and basilar atelectasis.  Original Report Authenticated By: Donavan Burnet, M.D.   Dg Abd Portable 1v  02/13/2012  *RADIOLOGY REPORT*  Clinical Data: Panda tube placement.  PORTABLE ABDOMEN - 1 VIEW  Comparison: Radiographs dated 02/07/2012  Findings: Panda tube is looped in the stomach with the tip in the fundus.  Bowel gas pattern is normal.  Consolidation and/or effusion at the left lung base.  IMPRESSION: Panda tube looped in the stomach as described.  Original Report Authenticated By: Gwynn Burly, M.D.     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  Has hx of smoking. Plan: -titrate oxygen to keep SpO2 > 92%  CARDIAC  5/15 Rt IJ TLC>>>5/22 5/16 Echo>>EF 60 to 65%, grade 2 diastolic dysfx, mild MR, mild/mod LA dilation  Chronic diastolic heart failure, CAD, HTN, hyperlipidemia.  Intermittent episodes of bradycardia, hypotension while asleep>>quickly resolves when awake while on vent.  Now with intermittent episodes of tachycardia. 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  Lt heel partial thickness loss. Plan: -post-op care per VVS  RENAL  5/21 Renal u/s>>no hydronephrosis  Acute renal failure 2nd to ATN in setting of sepsis, hypotension with hx of CVA.  Baseline  creatinine 0.81 from 02/02/12.  Making urine. Plan: -keep in even fluid balance -monitor urine outpt, renal fx -d/c HCTZ  Hypernatremia>>improving. Plan: -continue free water  NEURO  5/18 CT head>>no acute findings 5/18 MRI head>>negative for acute infarct.  Generalized atrophy. 5/20 EEG>>delta background with triphasic waves 5/22 EEG>>delta background with triphasic waves 5/24 EEG >>> 5/24 MRI C spine>>No evidence of findings to suggest cervical cord infarct. Cervical spondylotic changes most notable C5-6 followed by the C6-7.  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   Acute metabolic encephalopathy.  EEG shows triphasic waves>>LFT's normal.  Has acute renal failure, sepsis.  MRI head unremarkable.  Improved mental status since starting anti-sz meds. Plan: -depacon added 5/22 by neuro for possible seizure  Paraplegia. Plan: -continue baclofen, neurotin  ID  5/14 MR Rt foot>>posterior calcaneal osteomyelitis with cellulitis and low level fasciitis 5/18 CT abd/pelvis>>no source of infection 5/20 MR Rt thigh>>no findings for osteo 5/24 MR C-spine >>>  Cultures: 5/15 BCx2>>> negative 5/15 UC>>>negative 5/19 BC>>> 5/19 Sputum>>negative  Antibiotics: 5/14 Vanco (osteo)>>> 5/14 Zosyn (oseto)>>>5/20 5/20 Imipenem>>>5/23 5/20 Meropenem>>  Sepsis for Rt lower extremity  osteomyelitis.  No fever and clinically improving, but WBC increased. Plan: -ABx per ID>>change imipenem to meropenem 5/23 due to concern for seizures.  HEMATOLOGY  Anemia of critical illness, and renal failure. Plan: -f/u CBC -transfuse for Hb < 7  GI  Nutrition. Plan: -Speech 5/25>>D2 diet  -keep Panda with tube feeds until able to have adequate oral intake  Nausea. Plan: -prn zofran  ENDOCRINE  CBG (last 3)   Basename 02/14/12 0757 02/14/12 0342 02/14/12 0007  GLUCAP 181* 150* 151*  DM type II. Plan: -SSI  -increased lantus to 15 units 5/24  Hypothyroidism. Plan: -continue synthroid  Hx of  Gout.  Not active issue. -monitor clinically   BEST PRACTICE -Protonix for SUP -SQ heparin for DVT prophylaxis -Full code -Transfer to SDU  Will have FPTS assume primary care from 5/27 and PCCM sign off.   Coralyn Helling, MD 02/14/2012, 8:26 AM Pager:  [phone]

## 2012-02-14 NOTE — Progress Notes (Signed)
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  No Known Allergies  Patient Measurements: Height: 4\' 11"  (149.9 cm) Weight: 173 lb 11.6 oz (78.8 kg) IBW/kg (Calculated) : 43.2   Vital Signs: Temp: 98.2 F (36.8 C) (05/26 0800) Temp src: Oral (05/26 0800) BP: 132/95 mmHg (05/26 1100) Pulse Rate: 91  (05/26 1100) Intake/Output from previous day: 05/25 0701 - 05/26 0700 In: 3805 [P.O.:480; I.V.:210; NG/GT:2270; IV Piggyback:845] Out: 2260 [Urine:1960; Stool:300] Intake/Output from this shift: Total I/O In: 550 [I.V.:40; NG/GT:510] Out: 550 [Urine:550]  Labs:  [facility] 02/14/12 0330 02/13/12 0443 02/12/12 0420  WBC 21.5* 26.1* 21.7*  HGB 8.7* 9.2* 8.7*  PLT 352 403* 399  LABCREA -- -- --  CREATININE 2.06* 2.36* 2.28*   Estimated Creatinine Clearance: 23.7 ml/min (by C-G formula based on Cr of 2.06). No results found for this basename: VANCOTROUGH:2,VANCOPEAK:2,VANCORANDOM:2,GENTTROUGH:2,GENTPEAK:2,GENTRANDOM:2,TOBRATROUGH:2,TOBRAPEAK:2,TOBRARND:2,AMIKACINPEAK:2,AMIKACINTROU:2,AMIKACIN:2, in the last 72 hours   Microbiology: Recent Results (from the past 720 hour(s))  MRSA PCR SCREENING     Status: Normal   Collection Time   02/03/12  6:10 PM      Component Value Range Status Comment   MRSA by PCR NEGATIVE  NEGATIVE  Final   CULTURE, BLOOD (ROUTINE X 2)     Status: Normal   Collection Time   02/03/12  6:15 PM      Component Value Range Status Comment   Specimen Description BLOOD CENTRAL LINE   Final    Special Requests     Final    Value: BOTTLES DRAWN AEROBIC AND ANAEROBIC 10CC LEFT IJ CVC   Culture  Setup Time [id number]   Final    Culture NO GROWTH 5 DAYS   Final    Report Status 02/10/2012 FINAL   Final   URINE CULTURE     Status: Normal   Collection Time   02/03/12  6:20 PM      Component Value Range Status Comment   Specimen Description URINE, CATHETERIZED   Final    Special  Requests NONE   Final    Culture  Setup Time [id number]   Final    Colony Count NO GROWTH   Final    Culture NO GROWTH   Final    Report Status 02/05/2012 FINAL   Final   CULTURE, BLOOD (ROUTINE X 2)     Status: Normal   Collection Time   02/03/12  7:58 PM      Component Value Range Status Comment   Specimen Description BLOOD HAND RIGHT   Final    Special Requests BOTTLES DRAWN AEROBIC ONLY [facility]   Final    Culture  Setup Time [id number]   Final    Culture NO GROWTH 5 DAYS   Final    Report Status 02/10/2012 FINAL   Final   CULTURE, BLOOD (ROUTINE X 2)     Status: Normal   Collection Time   02/07/12 11:42 AM      Component Value Range Status Comment   Specimen Description BLOOD RIGHT HAND   Final    Special Requests BOTTLES DRAWN AEROBIC ONLY [facility]   Final    Culture  Setup Time [id number]   Final    Culture NO GROWTH 5 DAYS   Final    Report Status 02/13/2012 FINAL   Final   CULTURE, BLOOD (ROUTINE X 2)     Status: Normal   Collection Time   02/07/12  1:37 PM  Component Value Range Status Comment   Specimen Description BLOOD LEFT HAND   Final    Special Requests BOTTLES DRAWN AEROBIC AND ANAEROBIC [facility]   Final    Culture  Setup Time [id number]   Final    Culture NO GROWTH 5 DAYS   Final    Report Status 02/13/2012 FINAL   Final   CULTURE, RESPIRATORY     Status: Normal   Collection Time   02/07/12  2:22 PM      Component Value Range Status Comment   Specimen Description TRACHEAL ASPIRATE   Final    Special Requests NONE   Final    Gram Stain     Final    Value: FEW WBC PRESENT, PREDOMINANTLY PMN     NO SQUAMOUS EPITHELIAL CELLS SEEN     NO ORGANISMS SEEN   Culture Non-Pathogenic Oropharyngeal-type Flora Isolated.   Final    Report Status 02/10/2012 FINAL   Final   CLOSTRIDIUM DIFFICILE BY PCR     Status: Normal   Collection Time   02/12/12 10:31 AM      Component Value Range Status Comment   C difficile by pcr NEGATIVE  NEGATIVE  Final     Assessment: 66 y.o.  female on broad spectrum antibiotics (Day #13) for osteo and low level fascititis s/p R BKA 5/17.   Pt with CrCl ~24 ml/min. UOP 1 ml/kg/hr, good. Vancomycin trough was elevated 5/23 (suspect accumulation was d/t recent SCr elevation).  SCr same as 5/23 (was incr the past couple of days), uop has remained good though.  Abx:  5/14: Vanc >> 5/14: Zosyn >> 5/20 5/20: Imipenem >>5/22 5/22: Merrem >>  Cx:  5/24 Cdiff - Neg 5/19 Resp- NPF 5/19 Blood -neg 5/15: MRSA PCR: neg 5/15: Blood x2: neg 5/15: Urine: neg  Goal of Therapy:  Vanc trough 15-20 mcg/ml  Plan:  - Cont vancomycin 1500mg  IV q 36h  - Plan to recheck Vanc trough at Css (would be due 5/28 AM) - Continue Meropenem 500 mg IV q12h.  - F/u renal function and microbiological data. - f/up decision for abx LOT.  Rolland Porter, Pharm.D., BCPS Clinical Pharmacist Pager: [phone] 02/14/2012 11:55 AM

## 2012-02-14 NOTE — Progress Notes (Addendum)
Subjective: Patient continues to improve.  More alert.  More conversant.  Does not yet seem to be at baseline.    Objective: Current vital signs: BP 126/58  Pulse 73  Temp(Src) 98.2 F (36.8 C) (Oral)  Resp 14  Ht 4\' 11"  (1.499 m)  Wt 78.8 kg (173 lb 11.6 oz)  BMI 35.09 kg/m2  SpO2 100% Vital signs in last 24 hours: Temp:  [97.6 F (36.4 C)-98.2 F (36.8 C)] 98.2 F (36.8 C) (05/26 0800) Pulse Rate:  [55-98] 73  (05/26 0600) Resp:  [11-16] 14  (05/26 0600) BP: (72-175)/(37-72) 126/58 mmHg (05/26 0800) SpO2:  [95 %-100 %] 100 % (05/26 0600) FiO2 (%):  [2 %-3 %] 2 % (05/26 0200) Weight:  [78.8 kg (173 lb 11.6 oz)] 78.8 kg (173 lb 11.6 oz) (05/26 0500)  Intake/Output from previous day: 05/25 0701 - 05/26 0700 In: 3805 [P.O.:480; I.V.:210; NG/GT:2270; IV Piggyback:845] Out: 2260 [Urine:1960; Stool:300] Intake/Output this shift: Total I/O In: 165 [I.V.:10; NG/GT:155] Out: 200 [Urine:200] Nutritional status: Dysphagia  Neurologic Exam: Mental Status:  Sitting up in bed. Alert. Follows commands.  Speaks in full sentences but speech slurred.  Does not always seem to be appropriate. Cranial Nerves:  II: Pupils reactive. Blinks to bilateral confrontation  III,IV, VI: ptosis not present, extra-ocular motions intact bilaterally with patient tracking as I move around the room.  V,VII: Moves face symmetrically  VIII: Grossly intact  IX,X: Unable to test  XI: Unable to test  XII: tongue strength normal  Motor:  Patient lifts both arms off the bed to command to about 90 degrees. Lifts left leg minimally.  Tone and bulk:normal tone throughout; no atrophy noted  DTR's:  Trace in the upper extremities and absent in the left lower extemity  Plantars:  Right: Amputation    Left: mute  Cerebellar:  Not tested   Lab Results: Results for orders placed during the hospital encounter of 02/02/12 (from the past 48 hour(s))  CLOSTRIDIUM DIFFICILE BY PCR     Status: Normal   Collection  Time   02/12/12 10:31 AM      Component Value Range Comment   C difficile by pcr NEGATIVE  NEGATIVE    GLUCOSE, CAPILLARY     Status: Abnormal   Collection Time   02/12/12 12:26 PM      Component Value Range Comment   Glucose-Capillary 159 (*) 70 - 99 (mg/dL)   GLUCOSE, CAPILLARY     Status: Abnormal   Collection Time   02/12/12  4:47 PM      Component Value Range Comment   Glucose-Capillary 126 (*) 70 - 99 (mg/dL)   GLUCOSE, CAPILLARY     Status: Abnormal   Collection Time   02/12/12  7:47 PM      Component Value Range Comment   Glucose-Capillary 165 (*) 70 - 99 (mg/dL)   GLUCOSE, CAPILLARY     Status: Abnormal   Collection Time   02/13/12 12:15 AM      Component Value Range Comment   Glucose-Capillary 164 (*) 70 - 99 (mg/dL)    Comment 1 Documented in Chart      Comment 2 Notify RN     GLUCOSE, CAPILLARY     Status: Abnormal   Collection Time   02/13/12  4:40 AM      Component Value Range Comment   Glucose-Capillary 157 (*) 70 - 99 (mg/dL)    Comment 1 Documented in Chart      Comment 2 Notify RN  VALPROIC ACID LEVEL     Status: Abnormal   Collection Time   02/13/12  4:43 AM      Component Value Range Comment   Valproic Acid Lvl 20.3 (*) 50.0 - 100.0 (ug/mL)   CBC     Status: Abnormal   Collection Time   02/13/12  4:43 AM      Component Value Range Comment   WBC 26.1 (*) 4.0 - 10.5 (K/uL)    RBC 3.38 (*) 3.87 - 5.11 (MIL/uL)    Hemoglobin 9.2 (*) 12.0 - 15.0 (g/dL)    HCT 11.9 (*) 14.7 - 46.0 (%)    MCV 85.2  78.0 - 100.0 (fL)    MCH 27.2  26.0 - 34.0 (pg)    MCHC 31.9  30.0 - 36.0 (g/dL)    RDW 82.9 (*) 56.2 - 15.5 (%)    Platelets 403 (*) 150 - 400 (K/uL)   BASIC METABOLIC PANEL     Status: Abnormal   Collection Time   02/13/12  4:43 AM      Component Value Range Comment   Sodium 149 (*) 135 - 145 (mEq/L)    Potassium 3.6  3.5 - 5.1 (mEq/L)    Chloride 106  96 - 112 (mEq/L)    CO2 33 (*) 19 - 32 (mEq/L)    Glucose, Bld 160 (*) 70 - 99 (mg/dL)    BUN 72 (*) 6  - 23 (mg/dL)    Creatinine, Ser 1.30 (*) 0.50 - 1.10 (mg/dL)    Calcium 9.1  8.4 - 10.5 (mg/dL)    GFR calc non Af Amer 20 (*) >90 (mL/min)    GFR calc Af Amer 23 (*) >90 (mL/min)   POCT I-STAT 3, BLOOD GAS (G3+)     Status: Abnormal   Collection Time   02/13/12  5:05 AM      Component Value Range Comment   pH, Arterial 7.465 (*) 7.350 - 7.400     pCO2 arterial 50.3 (*) 35.0 - 45.0 (mmHg)    pO2, Arterial 83.0  80.0 - 100.0 (mmHg)    Bicarbonate 36.3 (*) 20.0 - 24.0 (mEq/L)    TCO2 38  0 - 100 (mmol/L)    O2 Saturation 97.0      Acid-Base Excess 11.0 (*) 0.0 - 2.0 (mmol/L)    Patient temperature 98.2 F      Collection site RADIAL, ALLEN'S TEST ACCEPTABLE      Drawn by Operator      Sample type ARTERIAL     GLUCOSE, CAPILLARY     Status: Abnormal   Collection Time   02/13/12  8:21 AM      Component Value Range Comment   Glucose-Capillary 151 (*) 70 - 99 (mg/dL)   GLUCOSE, CAPILLARY     Status: Abnormal   Collection Time   02/13/12 11:55 AM      Component Value Range Comment   Glucose-Capillary 112 (*) 70 - 99 (mg/dL)   GLUCOSE, CAPILLARY     Status: Abnormal   Collection Time   02/13/12  3:00 PM      Component Value Range Comment   Glucose-Capillary 163 (*) 70 - 99 (mg/dL)   GLUCOSE, CAPILLARY     Status: Abnormal   Collection Time   02/13/12  7:49 PM      Component Value Range Comment   Glucose-Capillary 189 (*) 70 - 99 (mg/dL)    Comment 1 Notify RN     GLUCOSE, CAPILLARY     Status:  Abnormal   Collection Time   02/14/12 12:07 AM      Component Value Range Comment   Glucose-Capillary 151 (*) 70 - 99 (mg/dL)    Comment 1 Notify RN     BASIC METABOLIC PANEL     Status: Abnormal   Collection Time   02/14/12  3:30 AM      Component Value Range Comment   Sodium 142  135 - 145 (mEq/L)    Potassium 3.3 (*) 3.5 - 5.1 (mEq/L)    Chloride 101  96 - 112 (mEq/L)    CO2 34 (*) 19 - 32 (mEq/L)    Glucose, Bld 155 (*) 70 - 99 (mg/dL)    BUN 69 (*) 6 - 23 (mg/dL)    Creatinine, Ser  1.61 (*) 0.50 - 1.10 (mg/dL)    Calcium 8.9  8.4 - 10.5 (mg/dL)    GFR calc non Af Amer 24 (*) >90 (mL/min)    GFR calc Af Amer 27 (*) >90 (mL/min)   CBC     Status: Abnormal   Collection Time   02/14/12  3:30 AM      Component Value Range Comment   WBC 21.5 (*) 4.0 - 10.5 (K/uL)    RBC 3.35 (*) 3.87 - 5.11 (MIL/uL)    Hemoglobin 8.7 (*) 12.0 - 15.0 (g/dL)    HCT 09.6 (*) 04.5 - 46.0 (%)    MCV 83.9  78.0 - 100.0 (fL)    MCH 26.0  26.0 - 34.0 (pg)    MCHC 31.0  30.0 - 36.0 (g/dL)    RDW 40.9 (*) 81.1 - 15.5 (%)    Platelets 352  150 - 400 (K/uL)   GLUCOSE, CAPILLARY     Status: Abnormal   Collection Time   02/14/12  3:42 AM      Component Value Range Comment   Glucose-Capillary 150 (*) 70 - 99 (mg/dL)   GLUCOSE, CAPILLARY     Status: Abnormal   Collection Time   02/14/12  7:57 AM      Component Value Range Comment   Glucose-Capillary 181 (*) 70 - 99 (mg/dL)     Recent Results (from the past 240 hour(s))  CULTURE, BLOOD (ROUTINE X 2)     Status: Normal   Collection Time   02/07/12 11:42 AM      Component Value Range Status Comment   Specimen Description BLOOD RIGHT HAND   Final    Special Requests BOTTLES DRAWN AEROBIC ONLY [facility]   Final    Culture  Setup Time [id number]   Final    Culture NO GROWTH 5 DAYS   Final    Report Status 02/13/2012 FINAL   Final   CULTURE, BLOOD (ROUTINE X 2)     Status: Normal   Collection Time   02/07/12  1:37 PM      Component Value Range Status Comment   Specimen Description BLOOD LEFT HAND   Final    Special Requests BOTTLES DRAWN AEROBIC AND ANAEROBIC [facility]   Final    Culture  Setup Time [id number]   Final    Culture NO GROWTH 5 DAYS   Final    Report Status 02/13/2012 FINAL   Final   CULTURE, RESPIRATORY     Status: Normal   Collection Time   02/07/12  2:22 PM      Component Value Range Status Comment   Specimen Description TRACHEAL ASPIRATE   Final    Special Requests NONE   Final  Gram Stain     Final    Value: FEW WBC PRESENT,  PREDOMINANTLY PMN     NO SQUAMOUS EPITHELIAL CELLS SEEN     NO ORGANISMS SEEN   Culture Non-Pathogenic Oropharyngeal-type Flora Isolated.   Final    Report Status 02/10/2012 FINAL   Final   CLOSTRIDIUM DIFFICILE BY PCR     Status: Normal   Collection Time   02/12/12 10:31 AM      Component Value Range Status Comment   C difficile by pcr NEGATIVE  NEGATIVE  Final     Lipid Panel No results found for this basename: CHOL,TRIG,HDL,CHOLHDL,VLDL,LDLCALC in the last 72 hours  Studies/Results: Mr Cervical Spine Wo Contrast  02/12/2012  *RADIOLOGY REPORT*  Clinical Data: History of spinal cord infarct  MRI CERVICAL SPINE WITHOUT CONTRAST  Technique:  Multiplanar and multiecho pulse sequences of the cervical spine, to include the craniocervical junction and cervicothoracic junction, were obtained according to standard protocol without intravenous contrast.  Comparison: No comparison MR of the cervical spine.  Findings: Cervical medullary junction unremarkable.  The cervical cord and upper thoracic cord are of normal signal without findings of increased signal to suggest infarct.  The patient is intubated.  Pooled secretions.  Vertebral arteries are patent.  C2-3:  Mild facet joint degenerative changes.  Mild bulge with tiny central protrusion.  No mass effect.  C3-4:  Facet joint degenerative changes greater on the left.  Very mild foraminal narrowing greater on the left.  Mild bulge.  C4-5:  Congenital fusion.  No spinal stenosis or foraminal narrowing.  C5-6:  Small to moderate sized broad-based protrusion left posterior lateral position with mild flattening of the left aspect of the cord.  Minimal left foraminal narrowing.  C6-7:  Small to moderate broad-based disc protrusion slightly greater to the left with mass effect upon the ventral nerve roots and minimal left-sided cord contact.  Minimal left foraminal narrowing.  C7-T1:  Mild facet joint degenerative changes.  Mild bilateral foraminal narrowing  greater on the left.  Sagittal imaging of the upper thoracic spine without significant spinal stenosis or foraminal narrowing.  IMPRESSION: No evidence of findings to suggest cervical cord infarct.  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  Original Report Authenticated By: Fuller Canada, M.D.   Dg Chest Port 1 View  02/13/2012  *RADIOLOGY REPORT*  Clinical Data: Endotracheal tube  PORTABLE CHEST - 1 VIEW  Comparison: Yesterday  Findings: Moderate cardiomegaly.  Vascular congestion.  Increasing basilar atelectasis.  No pneumothorax.  Endotracheal tube is stable.  Feeding tube remains with its tip in the fundus of the stomach.  IMPRESSION: Increasing vascular congestion and basilar atelectasis.  Original Report Authenticated By: Donavan Burnet, M.D.   Dg Abd Portable 1v  02/13/2012  *RADIOLOGY REPORT*  Clinical Data: Panda tube placement.  PORTABLE ABDOMEN - 1 VIEW  Comparison: Radiographs dated 02/07/2012  Findings: Panda tube is looped in the stomach with the tip in the fundus.  Bowel gas pattern is normal.  Consolidation and/or effusion at the left lung base.  IMPRESSION: Panda tube looped in the stomach as described.  Original Report Authenticated By: Gwynn Burly, M.D.    Medications:  I have reviewed the patient's current medications. Scheduled:   . amLODipine  5 mg Oral Daily  . antiseptic oral rinse  15 mL Mouth Rinse QID  . aspirin  81 mg Oral Daily  . atorvastatin  10  mg Oral Daily  . baclofen  10 mg Oral BID  . chlorhexidine  15 mL Mouth Rinse BID  . feeding supplement  30 mL Per Tube TID WC  . feeding supplement (PROMOTE)  1,000 mL Per Tube Q24H  . free water  250 mL Per Tube Q4H  . gabapentin  300 mg Oral QID  . heparin  5,000 Units Subcutaneous Q8H  . hydrALAZINE  25 mg Oral Q8H  . insulin aspart  0-15 Units Subcutaneous Q4H  . insulin glargine  15 Units Subcutaneous Daily  . levothyroxine  75  mcg Oral QAC breakfast  . meropenem (MERREM) IV  500 mg Intravenous Q12H  . ondansetron      . pantoprazole sodium  40 mg Per Tube QHS  . sodium chloride  3 mL Intravenous Q12H  . valproate sodium  1,500 mg Intravenous Q12H  . vancomycin  1,500 mg Intravenous Q36H    Assessment/Plan:  Patient Active Hospital Problem List: Altered mental status (02/03/2012)   Assessment: Mental status continues to improve, although patient may not yet be back to baseline.  Seems to be tolerating Depakote well.     Plan: 1. Would continue Depakote at current dose. As patient comes closer to baseline will make a decision as to when to consider its discontinuation.  Generalized weakness    Assessment: Resolving.    Plan: PT  Case discussed with Dr. Craige Cotta   LOS: 12 days   Thana Farr, MD [facility] [phone] 02/14/2012  8:48 AM

## 2012-02-15 ENCOUNTER — Inpatient Hospital Stay (HOSPITAL_COMMUNITY): Payer: Medicare Other

## 2012-02-15 LAB — CBC
Hemoglobin: 8.1 g/dL — ABNORMAL LOW (ref 12.0–15.0)
MCH: 27.1 pg (ref 26.0–34.0)
MCHC: 32.9 g/dL (ref 30.0–36.0)
RDW: 15.8 % — ABNORMAL HIGH (ref 11.5–15.5)

## 2012-02-15 LAB — GLUCOSE, CAPILLARY
Glucose-Capillary: 140 mg/dL — ABNORMAL HIGH (ref 70–99)
Glucose-Capillary: 124 mg/dL — ABNORMAL HIGH (ref 70–99)
Glucose-Capillary: 148 mg/dL — ABNORMAL HIGH (ref 70–99)
Glucose-Capillary: 195 mg/dL — ABNORMAL HIGH (ref 70–99)

## 2012-02-15 LAB — BASIC METABOLIC PANEL
BUN: 60 mg/dL — ABNORMAL HIGH (ref 6–23)
Calcium: 8.4 mg/dL (ref 8.4–10.5)
GFR calc Af Amer: 30 mL/min — ABNORMAL LOW (ref >90)
GFR calc non Af Amer: 26 mL/min — ABNORMAL LOW (ref >90)
Glucose, Bld: 116 mg/dL — ABNORMAL HIGH (ref 70–99)
Potassium: 3.2 mEq/L — ABNORMAL LOW (ref 3.5–5.1)
Sodium: 138 mEq/L (ref 135–145)

## 2012-02-15 MED ORDER — METOPROLOL SUCCINATE ER 25 MG PO TB24
25.0000 mg | ORAL_TABLET | Freq: Every day | ORAL | Status: DC
  Administered 2012-02-15 – 2012-02-17 (×3): 25 mg via ORAL
  Filled 2012-02-15 (×3): qty 1

## 2012-02-15 MED ORDER — INSULIN ASPART 100 UNIT/ML ~~LOC~~ SOLN
0.0000 [IU] | Freq: Three times a day (TID) | SUBCUTANEOUS | Status: DC
  Administered 2012-02-15: 2 [IU] via SUBCUTANEOUS
  Administered 2012-02-16: 3 [IU] via SUBCUTANEOUS
  Administered 2012-02-20: 2 [IU] via SUBCUTANEOUS

## 2012-02-15 MED ORDER — INSULIN ASPART 100 UNIT/ML ~~LOC~~ SOLN
0.0000 [IU] | Freq: Three times a day (TID) | SUBCUTANEOUS | Status: DC
  Administered 2012-02-15: 2 [IU] via SUBCUTANEOUS

## 2012-02-15 NOTE — Progress Notes (Signed)
CNA went it to pts room @ 0645 feeding tube in, I went to pt room at 0655 feeding tube out in floor intact, pt for swallow tudy will await MD rounding

## 2012-02-15 NOTE — Progress Notes (Addendum)
Physical Therapy Evaluation Patient Details Name: Alexandra Wiley MRN: [id number] DOB: 07-29-1946 Today's Date: 02/15/2012 Time: 2841-3244 PT Time Calculation (min): 32 min  PT Assessment / Plan / Recommendation Clinical Impression  Pt is a 66 y/o female admitted s/p right AKA with respiratory distress and re-intubation along with the below PT problem list.  Pt limited by premorbid left sided weakness.  Pt would benefit from acute PT to maximize indepedence/safety to facilitate d/c to SNF.    PT Assessment  Patient needs continued PT services    Follow Up Recommendations  Skilled nursing facility    Barriers to Discharge None      lEquipment Recommendations  Defer to next venue    Recommendations for Other Services     Frequency Min 3X/week    Precautions / Restrictions Precautions Precautions: Fall Restrictions Weight Bearing Restrictions: No Other Position/Activity Restrictions: Right AKA   Pertinent Vitals/Pain N/A      Mobility  Bed Mobility Bed Mobility: Supine to Sit;Sit to Supine;Rolling Right;Rolling Left Rolling Right: 4: Min assist;With rail Rolling Left: 4: Min assist;With rail Supine to Sit: 1: +2 Total assist;HOB flat Supine to Sit: Patient Percentage: 20% Sit to Supine: 1: +2 Total assist;HOB flat Sit to Supine: Patient Percentage: 20% Details for Bed Mobility Assistance: Assist to left LE and trunk to facilitate rotation and anterior translate to EOB with max cues for sequence.  Facilitation to trunk/pelvis to rotate during rolling.  Cues for sequence. Transfers Transfers: Not assessed Ambulation/Gait Ambulation/Gait Assistance: Not tested (comment) Stairs: No Wheelchair Mobility Wheelchair Mobility: No    Exercises  Educated pt on desensitization exercises for R residual limb.    PT Diagnosis: Generalized weakness  PT Problem List: Decreased strength;Decreased activity tolerance;Decreased balance;Decreased mobility;Decreased cognition PT  Treatment Interventions: Functional mobility training;Therapeutic activities;Therapeutic exercise;Balance training;Patient/family education   PT Goals Acute Rehab PT Goals PT Goal Formulation: With patient Time For Goal Achievement: 02/29/12 Potential to Achieve Goals: Good Pt will Roll Supine to Right Side: with supervision PT Goal: Rolling Supine to Right Side - Progress: Goal set today Pt will Roll Supine to Left Side: with supervision PT Goal: Rolling Supine to Left Side - Progress: Goal set today Pt will go Supine/Side to Sit: with mod assist PT Goal: Supine/Side to Sit - Progress: Goal set today Pt will Sit at Edge of Bed: with supervision;3-5 min;with bilateral upper extremity support PT Goal: Sit at Edge Of Bed - Progress: Goal set today Pt will go Sit to Stand: with mod assist PT Goal: Sit to Stand - Progress: Goal set today Pt will Transfer Bed to Chair/Chair to Bed: with mod assist PT Transfer Goal: Bed to Chair/Chair to Bed - Progress: Goal set today Pt will Propel Wheelchair: > 150 feet;with supervision;with cues (comment type and amount) PT Goal: Propel Wheelchair - Progress: Goal set today  Visit Information  Last PT Received On: 02/15/12 Assistance Needed: +2 PT/OT Co-Evaluation/Treatment: Yes    Subjective Data  Subjective: "Let's go ahead and give it a try." Patient Stated Goal: Get strong.   Prior Functioning  Home Living Lives With: Son (Husband and grandson) Available Help at Discharge: Family Type of Home: House Home Access: Stairs to enter Secretary/administrator of Steps:   Home Layout: One level Bathroom Shower/Tub: Tub/shower unit;Curtain Firefighter: Standard Home Adaptive Equipment: Wheelchair - manual Prior Function Level of Independence: Needs assistance Communication Communication: No difficulties    Cognition  Overall Cognitive Status: Impaired Area of Impairment: Awareness of errors;Awareness of deficits;Problem solving  Orientation  Level: Oriented X4 / Intact Awareness of Errors: Assistance required to identify errors made;Assistance required to correct errors made Problem Solving: Slow to process information.    Extremity/Trunk Assessment Right Upper Extremity Assessment RUE ROM/Strength/Tone:  (Defer to OT Evaluation.) Left Upper Extremity Assessment LUE ROM/Strength/Tone:  (Defer to OT evaluation.) Right Lower Extremity Assessment RLE ROM/Strength/Tone: Deficits RLE ROM/Strength/Tone Deficits: 2/5 in hip. RLE Sensation: WFL - Light Touch RLE Coordination: WFL - gross motor Left Lower Extremity Assessment LLE ROM/Strength/Tone: Deficits LLE ROM/Strength/Tone Deficits: 2/5 throughout. LLE Sensation: WFL - Light Touch LLE Coordination: WFL - gross motor Trunk Assessment Trunk Assessment: Other exceptions (Right lateral curvature.)   Balance Balance Balance Assessed: Yes Static Sitting Balance Static Sitting - Balance Support: Bilateral upper extremity supported;Feet supported Static Sitting - Level of Assistance: 3: Mod assist (Up to mod assist with pt progressing to min assist.) Static Sitting - Comment/# of Minutes: Pt able to sit EOB for 10-15 minutes focusing on midline allignment of trunk with neutral scapular allignment.  Pt with increased right lateral curvature with cues/facilitation to elongate right side of trunk while contracting left side as well as rolling shoulders back with scapulae retracted.  Practiced down on left elbow to increase flexion on left side.  Cues for sequence and proper allignment throughout.  End of Session PT - End of Session Activity Tolerance: Patient tolerated treatment well Patient left: in bed;with call bell/phone within reach;with family/visitor present Nurse Communication: Mobility status   Cephus Shelling 02/15/2012, 9:50 AM  02/15/2012 Cephus Shelling, PT, DPT [phone]

## 2012-02-15 NOTE — Progress Notes (Signed)
Pt tx 3700 per MD order, pt VSS, family at [facility], pt and family verbalized understanding of tx, foley d/c at 1200 pm currently with no post void, report called to RN, all questions answered

## 2012-02-15 NOTE — Procedures (Signed)
Objective Swallowing Evaluation: Modified Barium Swallowing Study  Patient Details  Name: Alexandra Wiley MRN: [id number] Date of Birth: 1946-05-26  Today's Date: 02/15/2012 Time: 8295-6213 SLP Time Calculation (min): 15 min  Past Medical History:  Past Medical History  Diagnosis Date  . Ulcer   . Hypertension   . Heart murmur   . Gout   . PVD (peripheral vascular disease)   . Pain in limb   . CHF (congestive heart failure)   . 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 Conus medullaris syndrome   . Arthritis   . Breast cancer ~ 1975    right  . Carpal tunnel syndrome of right wrist   . High cholesterol   . Angina   . Myocardial infarction 1996  . Pneumonia ~ 2002; ~1975  . Type II diabetes mellitus   . Exertional dyspnea   . Hypothyroidism   . Seizures 02/02/12    "used to; a long time ago"  . Stroke 10/2008    "spinal cord"   Past Surgical History:  Past Surgical History  Procedure Date  . Foot tendon surgery 2000    Left foot  . Tubal ligation   . Femur fracture surgery 1960's    Left leg, repair of non-union femur fracture by Dr. Orland Jarred  . Breast lumpectomy ~1975    Right breast carcinoma in situ  . Tonsillectomy and adenoidectomy ~ 1961  . Dilation and curettage of uterus   . Fracture surgery   . Amputation 02/05/2012    Procedure: AMPUTATION BELOW KNEE;  Surgeon: Larina Earthly, MD;  Location: [facility] OR;  Service: Vascular;  Laterality: Right;  right  above knee amputation   HPI:  66 yo female smoker admitted by FPTS on 02/02/2012 non-healing ulcer 1st Rt toe and heel.  Seen by VVS and felt to have non-viable Rt lower extremity.  Transferred to ICU 5/16 for low BP, acute encephalopathy, possible sepsis, and PCCM consulted.  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  Intubated 5/18 due to altered mental status from hyperkalemia, acidosis, and shock.  Extubated 5/25     Assessment / Plan / Recommendation Clinical Impression  Dysphagia Diagnosis: Within Functional Limits Clinical  impression: Patient presents with a safe and functional oropharyngeal swallow characterized only by intermittently delayed swallow initiation (likely age related) but with full airway protection and bolus clearance. Possible small osteophytes noted at C5-6 (MD not present to confirm) however this did not significantly impact overall function. Patient judged safe to initiate a regular diet at this time. SLP will f/u briefly at bedside to ensure tolerance.     Treatment Recommendation  Therapy as outlined in treatment plan below    Diet Recommendation Regular;Thin liquid      Follow Up Recommendations  None    Frequency and Duration min 2x/week  2 weeks       SLP Swallow Goals Patient will consume recommended diet without observed clinical signs of aspiration with: Moderate assistance Swallow Study Goal #1 - Progress: Progressing toward goal Patient will utilize recommended strategies during swallow to increase swallowing safety with: Moderate assistance Swallow Study Goal #2 - Progress: Progressing toward goal   General HPI: 66 yo female smoker admitted by FPTS on 02/02/2012 non-healing ulcer 1st Rt toe and heel.  Seen by VVS and felt to have non-viable Rt lower extremity.  Transferred to ICU 5/16 for low BP, acute encephalopathy, possible sepsis, and PCCM consulted.  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  Intubated 5/18 due to altered  mental status from hyperkalemia, acidosis, and shock.  Extubated 5/25 Type of Study: Modified Barium Swallowing Study Previous Swallow Assessment: Bedside swallow evaluation complete on this admission recommended objective evaluation due to s/s of aspiration observed with po trials.  Diet Prior to this Study: NPO;IV Temperature Spikes Noted: No Respiratory Status: Supplemental O2 delivered via (comment) (nasal cannula) History of Intubation: Yes Length of Intubations (days): 10 days Date extubated: 02/13/12 Behavior/Cognition: Alert;Cooperative;Pleasant mood Oral Cavity -  Dentition: Dentures, top;Dentures, bottom Oral Motor / Sensory Function: Within functional limits Vision: Functional for self-feeding Patient Positioning: Upright in chair Baseline Vocal Quality: Clear Volitional Cough: Strong Volitional Swallow: Able to elicit Anatomy: Other (Comment) (possbile osteophytes at C5-6; MD not present to confirm) Pharyngeal Secretions: Not observed secondary MBS       Ferdinand Lango MA, CCC-SLP [phone]           Marliss Buttacavoli Meryl 02/15/2012, 9:52 AM

## 2012-02-15 NOTE — Plan of Care (Signed)
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

## 2012-02-15 NOTE — Progress Notes (Signed)
Family Medicine Teaching Service Attending Note  I interviewed and examined patient Alexandra Wiley and reviewed their tests and x-rays.  I discussed with Dr. Gwenlyn Saran and reviewed their note for today.  I agree with their assessment and plan.     Additionally  Awake oriented and conversant Thighs non tender Stop antibiotics and observe for signs of infection Physical Therapy

## 2012-02-15 NOTE — Progress Notes (Signed)
INFECTIOUS DISEASE PROGRESS NOTE  ID: Alexandra Wiley is a 66 y.o. female with   Principal Problem:  *Osteomyelitis of right foot Active Problems:  Conus medullaris syndrome  Septic shock  Oliguria  Diabetes mellitus  Altered mental status  Acute respiratory failure  Subjective: Awake and alert  Abtx:  Anti-infectives     Start     Dose/Rate Route Frequency Ordered Stop   02/11/12 2000   vancomycin (VANCOCIN) 1,500 mg in sodium chloride 0.9 % 500 mL IVPB  Status:  Discontinued        1,500 mg 250 mL/hr over 120 Minutes Intravenous Every 36 hours 02/11/12 0911 02/15/12 1139   02/10/12 1800   meropenem (MERREM) 500 mg in sodium chloride 0.9 % 50 mL IVPB  Status:  Discontinued        500 mg 100 mL/hr over 30 Minutes Intravenous Every 12 hours 02/10/12 1724 02/15/12 1139   02/08/12 1200   imipenem-cilastatin (PRIMAXIN) 250 mg in sodium chloride 0.9 % 100 mL IVPB  Status:  Discontinued        250 mg 200 mL/hr over 30 Minutes Intravenous 4 times per day 02/08/12 1046 02/10/12 1647   02/07/12 1500   piperacillin-tazobactam (ZOSYN) IVPB 2.25 g  Status:  Discontinued        2.25 g 100 mL/hr over 30 Minutes Intravenous 3 times per day 02/07/12 1458 02/08/12 1046   02/07/12 0800   vancomycin (VANCOCIN) IVPB 1000 mg/200 mL premix  Status:  Discontinued        1,000 mg 200 mL/hr over 60 Minutes Intravenous Every 24 hours 02/07/12 1459 02/11/12 0840   02/03/12 2000   vancomycin (VANCOCIN) IVPB 1000 mg/200 mL premix  Status:  Discontinued        1,000 mg 200 mL/hr over 60 Minutes Intravenous Every 12 hours 02/03/12 0919 02/07/12 1459   02/03/12 1730   piperacillin-tazobactam (ZOSYN) IVPB 3.375 g  Status:  Discontinued        3.375 g 100 mL/hr over 30 Minutes Intravenous  Once 02/03/12 1723 02/03/12 1725   02/03/12 1730   vancomycin (VANCOCIN) IVPB 1000 mg/200 mL premix  Status:  Discontinued        1,000 mg 200 mL/hr over 60 Minutes Intravenous  Once 02/03/12 1723 02/03/12 1725     02/03/12 0100   piperacillin-tazobactam (ZOSYN) IVPB 3.375 g  Status:  Discontinued        3.375 g 12.5 mL/hr over 240 Minutes Intravenous Every 8 hours 02/02/12 2233 02/07/12 1458   02/02/12 1700   vancomycin (VANCOCIN) 1,250 mg in sodium chloride 0.9 % 250 mL IVPB  Status:  Discontinued        1,250 mg 166.7 mL/hr over 90 Minutes Intravenous Every 12 hours 02/02/12 1613 02/03/12 0919   02/02/12 1545  piperacillin-tazobactam (ZOSYN) 3.375 g in dextrose 5 % 50 mL IVPB       3.375 g 100 mL/hr over 30 Minutes Intravenous  Once 02/02/12 1530 02/02/12 1937          Medications:  Scheduled:   . amLODipine  5 mg Oral Daily  . antiseptic oral rinse  15 mL Mouth Rinse QID  . aspirin  81 mg Oral Daily  . atorvastatin  10 mg Oral Daily  . baclofen  10 mg Oral BID  . gabapentin  300 mg Oral QID  . heparin  5,000 Units Subcutaneous Q8H  . insulin aspart  0-15 Units Subcutaneous TID WC  . insulin glargine  15 Units  Subcutaneous Daily  . levothyroxine  75 mcg Oral QAC breakfast  . metoprolol succinate  25 mg Oral Daily  . ondansetron      . pantoprazole sodium  40 mg Per Tube QHS  . sodium chloride  3 mL Intravenous Q12H  . valproate sodium  1,500 mg Intravenous Q12H  . DISCONTD: chlorhexidine  15 mL Mouth Rinse BID  . DISCONTD: feeding supplement  30 mL Per Tube TID WC  . DISCONTD: feeding supplement (PROMOTE)  1,000 mL Per Tube Q24H  . DISCONTD: free water  250 mL Per Tube Q4H  . DISCONTD: hydrALAZINE  25 mg Oral Q8H  . DISCONTD: insulin aspart  0-15 Units Subcutaneous Q4H  . DISCONTD: insulin aspart  0-15 Units Subcutaneous TID WC  . DISCONTD: meropenem (MERREM) IV  500 mg Intravenous Q12H  . DISCONTD: vancomycin  1,500 mg Intravenous Q36H    Objective: Vital signs in last 24 hours: Temp:  [97.2 F (36.2 C)-98.9 F (37.2 C)] 97.6 F (36.4 C) (05/27 1400) Pulse Rate:  [79-106] 84  (05/27 1400) Resp:  [14-22] 18  (05/27 1400) BP: (87-133)/(39-63) 114/63 mmHg (05/27  1400) SpO2:  [94 %-100 %] 94 % (05/27 1400) Weight:  [77.5 kg (170 lb 13.7 oz)] 77.5 kg (170 lb 13.7 oz) (05/27 0500)   General appearance: alert, appears stated age and no distress Resp: clear to auscultation bilaterally Cardio: regular rate and rhythm GI: normal findings: bowel sounds normal and soft, non-tender her stump is well healed. her L foot ulcer is unchanged, her L foot is warm.  MSE 3/3  Lab Results  Basename 02/15/12 0350 02/14/12 0330  WBC 18.4* 21.5*  HGB 8.1* 8.7*  HCT 24.6* 28.1*  NA 138 142  K 3.2* 3.3*  CL 99 101  CO2 30 34*  BUN 60* 69*  CREATININE 1.89* 2.06*  GLU -- --   Liver Panel No results found for this basename: PROT:2,ALBUMIN:2,AST:2,ALT:2,ALKPHOS:2,BILITOT:2,BILIDIR:2,IBILI:2 in the last 72 hours Sedimentation Rate No results found for this basename: ESRSEDRATE in the last 72 hours C-Reactive Protein No results found for this basename: CRP:2 in the last 72 hours  Microbiology: Recent Results (from the past 240 hour(s))  CULTURE, BLOOD (ROUTINE X 2)     Status: Normal   Collection Time   02/07/12 11:42 AM      Component Value Range Status Comment   Specimen Description BLOOD RIGHT HAND   Final    Special Requests BOTTLES DRAWN AEROBIC ONLY [facility]   Final    Culture  Setup Time [id number]   Final    Culture NO GROWTH 5 DAYS   Final    Report Status 02/13/2012 FINAL   Final   CULTURE, BLOOD (ROUTINE X 2)     Status: Normal   Collection Time   02/07/12  1:37 PM      Component Value Range Status Comment   Specimen Description BLOOD LEFT HAND   Final    Special Requests BOTTLES DRAWN AEROBIC AND ANAEROBIC [facility]   Final    Culture  Setup Time [id number]   Final    Culture NO GROWTH 5 DAYS   Final    Report Status 02/13/2012 FINAL   Final   CULTURE, RESPIRATORY     Status: Normal   Collection Time   02/07/12  2:22 PM      Component Value Range Status Comment   Specimen Description TRACHEAL ASPIRATE   Final    Special Requests NONE   Final  Gram Stain     Final    Value: FEW WBC PRESENT, PREDOMINANTLY PMN     NO SQUAMOUS EPITHELIAL CELLS SEEN     NO ORGANISMS SEEN   Culture Non-Pathogenic Oropharyngeal-type Flora Isolated.   Final    Report Status 02/10/2012 FINAL   Final   CLOSTRIDIUM DIFFICILE BY PCR     Status: Normal   Collection Time   02/12/12 10:31 AM      Component Value Range Status Comment   C difficile by pcr NEGATIVE  NEGATIVE  Final     Studies/Results: Dg Abd Portable 1v  02/14/2012  *RADIOLOGY REPORT*  Clinical Data: Abdominal pain.  Panda tube.  PORTABLE ABDOMEN - 1 VIEW  Comparison: 02/13/2012  Findings: The feeding tube is in place, coiled back upon itself with tip in the region of the fundus.  Bowel gas pattern is nonobstructive.  Degenerative changes are seen in the lower spine.  IMPRESSION: Feeding tube tip to the level of the gastric fundus.  Original Report Authenticated By: Patterson Hammersmith, M.D.     Assessment/Plan: Sepsis/SIRS  L heel ulcer  Leukocytosis  Encephalopathy  Day 14 Anbx (Merrem/vanco)  MRI with ? Of myositis in thighs  Ischemia LLE BCx and resp Cx are negative (5-15, 5-19)  anbx being d/c by primary service, she has made dramatic improvement Available as needed.    Johny Sax Infectious Diseases [phone] 02/15/2012, 3:36 PM   LOS: 13 days

## 2012-02-15 NOTE — Progress Notes (Signed)
Family Medicine Teaching Service [facility] Progress Note  Patient name: Alexandra Wiley Medical record number: [id number] Date of birth: Dec 24, 1945 Age: 66 y.o. Gender: female    LOS: 13 days   Primary Care Provider: Laurena Slimmer, MD, MD  Overnight Events:  Patient pulled her feeding tube out this morning. Son at bedside says that her mental status is improving but that she is not entirely back to her normal self.  Back to NPO yesterday after swallow eval. Today to get barium swallow.  Objective: Vital signs in last 24 hours: Temp:  [97.8 F (36.6 C)-98.9 F (37.2 C)] 98.5 F (36.9 C) (05/27 0732) Pulse Rate:  [79-106] 89  (05/27 0841) Resp:  [13-22] 22  (05/27 0841) BP: (82-145)/(39-95) 125/57 mmHg (05/27 0841) SpO2:  [93 %-100 %] 100 % (05/27 0841) Weight:  [170 lb 13.7 oz (77.5 kg)] 170 lb 13.7 oz (77.5 kg) (05/27 0500)  Wt Readings from Last 3 Encounters:  02/15/12 170 lb 13.7 oz (77.5 kg)  02/15/12 170 lb 13.7 oz (77.5 kg)  02/15/12 170 lb 13.7 oz (77.5 kg)     PE: Filed Vitals:   02/15/12 0500 02/15/12 0732 02/15/12 0836 02/15/12 0841  BP:   112/52 125/57  Pulse:   84 89  Temp:  98.5 F (36.9 C)    TempSrc:      Resp:   17 22  Height:      Weight: 170 lb 13.7 oz (77.5 kg)     SpO2:   99% 100%   Gen: awake, alert and talking on the phone with her brother.  HEENT: moist mucous membranes, nasal canula in place.  CV: S1S2, RRR, 3/6 systolic murmur unchanged from previous Res: cta b/l anteriorly Abd: soft, non distended, present bowel sounds Ext/Musc: left heel elevated with boot in place. right stump clean and dry. No drainage or erythema. Staples in place.   Labs/Studies:  Basic Metabolic Panel:    Component Value Date/Time   NA 138 02/15/2012 0350   K 3.2* 02/15/2012 0350   CL 99 02/15/2012 0350   CO2 30 02/15/2012 0350   BUN 60* 02/15/2012 0350   CREATININE 1.89* 02/15/2012 0350   GLUCOSE 116* 02/15/2012 0350   CALCIUM 8.4 02/15/2012 0350   CBC:      Component Value Date/Time   WBC 18.4* 02/15/2012 0350   HGB 8.1* 02/15/2012 0350   HCT 24.6* 02/15/2012 0350   PLT 303 02/15/2012 0350   MCV 82.3 02/15/2012 0350   NEUTROABS 14.3* 02/11/2012 0415   LYMPHSABS 3.6 02/11/2012 0415   MONOABS 3.2* 02/11/2012 0415   EOSABS 0.0 02/11/2012 0415   BASOSABS 0.0 02/11/2012 0415    Chest X-ray: Endotracheal tube terminates 4.5 cm above the carina. Pulmonary vascular congestion without frank interstitial edema, improved.   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 Was transferred to [facility] service due to concern for sepsis. 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 Was re-intubated on 5/18 because of unresponsiveness, decreased sensorium and extubated on 02/13/12.   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 MRI on 5/20 did not show any findings of osteomyelitis post right above knee. Questionable myofasciitis in both thighs.  - blood cultures neg x5days.  - sputum cultures negative - Currently on van (start 5/14) and meropenem (start 5/20). (was on zosyn from 5/14-5/20,  as well as imipenem from 5/20 to 5/23). Will d/c antibiotics since improving and no focal sign of infection.  - wbc improving at 18.4 compared to 21.5 yesterday  # acute encephalopathy:  Patient intubated 5/18; likely with metabolic encephalopathy. Was extubated on 05/25 Neurology on board. Negative MRI, CT. Initial EEG showing moderate encephalopathy of nonspecific etiology.  Latest EEG showed improvement compared to previous.  - continue Depakote at 1500mg  bid IV, per neuro recs  # Peripheral vascular disease:  - continue to monitor left heal and foot  - follow up vascular surgery recommendations for potential revascularization of left leg in the future.   #  Acute renal insufficiency: likely from hypovolemia and ATN. Improving at 1.89 today from 2.06 yesterday.   # Acute Respiratory failure 2/2 acute encephalopathy:  - CT chest on 5/20 showed patchy airspace disease in the left upper lobe with basilar consolidation suggestive of pneumonia. - extubation on 5/25. Currently on 3L [place].   # Diabetes:   CBG (last 3)   Basename 02/15/12 1009 02/15/12 0336 02/14/12 2311  GLUCAP 114* 124* 140*   - novolog sliding scale coverage  - on Lantus 15 units  - continue monitoring especially as diet is advanced.   # h/o spinal cord infarct:  - continue home meds for spasms and pain: baclofen 10bid, gabapentin 300 qid  - MRI from 05/24 did not show any evidence of spinal cord infarct. 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   # diastolic CHF grade 2 diastolic dysfunction with EF of 60-65% on May 2012 echo   -  current therapy w/ amlodipine, hydralazine, ASA 81mg . HCTZ was stopped in the setting of renal failure.  - Will d/c hydralazine and start her back on low dose metoprolol 25mg  daily.   # h/o gout: hold allopurinol. No ss of flare.  # h/o HTN: has fluctuating BP depending on which arm is used for measurement.  - continue amlodipine  - d/c hydralazine and start metoprolol 25mg  #h/o hypothyroid:  - continue home levothyroxine  PPx:  - heparin sub q  PPI  Fen/GI:  Barium swallow study: recs: regular thin liquids  Dispo: transfer to tele today - Pt is FULL CODE   LOS 13  Signed: Marena Chancy, MD Family Medicine Resident PGY-1 [phone] 02/15/2012 10:13 AM

## 2012-02-15 NOTE — Progress Notes (Signed)
Chaplain provided patient with a Bible as requested. Follow up as needed.

## 2012-02-16 ENCOUNTER — Telehealth: Payer: Self-pay | Admitting: Vascular Surgery

## 2012-02-16 LAB — BASIC METABOLIC PANEL
CO2: 31 mEq/L (ref 19–32)
Calcium: 8.6 mg/dL (ref 8.4–10.5)
Glucose, Bld: 87 mg/dL (ref 70–99)
Potassium: 3.6 mEq/L (ref 3.5–5.1)
Sodium: 139 mEq/L (ref 135–145)

## 2012-02-16 LAB — CBC
Hemoglobin: 8 g/dL — ABNORMAL LOW (ref 12.0–15.0)
MCH: 26.9 pg (ref 26.0–34.0)
Platelets: 305 10*3/uL (ref 150–400)
RBC: 2.97 MIL/uL — ABNORMAL LOW (ref 3.87–5.11)
WBC: 15.1 10*3/uL — ABNORMAL HIGH (ref 4.0–10.5)

## 2012-02-16 LAB — GLUCOSE, CAPILLARY

## 2012-02-16 MED ORDER — DIVALPROEX SODIUM 500 MG PO DR TAB
1000.0000 mg | DELAYED_RELEASE_TABLET | Freq: Two times a day (BID) | ORAL | Status: DC
  Administered 2012-02-17 – 2012-02-20 (×8): 1000 mg via ORAL
  Filled 2012-02-16 (×8): qty 2

## 2012-02-16 MED ORDER — LEVETIRACETAM 750 MG PO TABS
1500.0000 mg | ORAL_TABLET | Freq: Two times a day (BID) | ORAL | Status: DC
  Administered 2012-02-16: 1500 mg via ORAL
  Filled 2012-02-16 (×2): qty 2

## 2012-02-16 MED ORDER — ALBUTEROL SULFATE HFA 108 (90 BASE) MCG/ACT IN AERS
2.0000 | INHALATION_SPRAY | RESPIRATORY_TRACT | Status: DC | PRN
  Filled 2012-02-16: qty 6.7

## 2012-02-16 NOTE — Clinical Social Work Psychosocial (Signed)
     Clinical Social Work Department BRIEF PSYCHOSOCIAL ASSESSMENT 02/16/2012  Patient:  Alexandra Wiley, Alexandra Wiley     Account Number:  [id number]     Admit date:  02/02/2012  Clinical Social Worker:  Alexandra Wiley  Date/Time:  02/16/2012 10:14 AM  Referred by:  Physician  Date Referred:  02/16/2012 Referred for  SNF Placement   Other Referral:   Interview type:  Patient Other interview type:   CSW also spoke with pt spouse Alexandra Wiley [phone]    PSYCHOSOCIAL DATA Living Status:  FAMILY Admitted from facility:   Level of care:   Primary support name:  Alexandra Wiley Primary support relationship to patient:  SPOUSE Degree of support available:   Pt lives at home with her husband and daughter. Pt spouse is able to provide 24hr care.    CURRENT CONCERNS Current Concerns  Post-Acute Placement   Other Concerns:    SOCIAL WORK ASSESSMENT / PLAN CSW received referral for SNF placement. CSW visited pt room and spoke with pt regarding disposition options. Pt stated she would not be agreeable to going to a SNF as she is able to go home with her spouse and daughter who are able to provide 24hr care. Pt stated she would prefer PT/OT at home and stated she has had [facility] in the past.    CSW received consent to speak to pt spouse to confirm he is able to provide care. CSW spoke with spouse over the phone and stated that he is retired, stays home and is able to care for pt. Spouse stated he too prefers that pt return home at dc with PT/OT following. CSW informed spouse and pt that a RNCM would follow up and assist with getting PT/OT at home.   Assessment/plan status:  No Further Intervention Required Other assessment/ plan:   Information/referral to community resources:    PATIENTS/FAMILYS RESPONSE TO PLAN OF CARE: Pt was sitting in the recliner alert and appeared oriented. Pt stated she preferred to return home with PT/OT following. Pt spouse was also agreeable with pt  returning home at dc.

## 2012-02-16 NOTE — Progress Notes (Signed)
Physical Therapy Treatment Patient Details Name: Alexandra Wiley MRN: [id number] DOB: Jan 01, 1946 Today's Date: 02/16/2012 Time: 0727-0742 PT Time Calculation (min): 15 min  PT Assessment / Plan / Recommendation Comments on Treatment Session  Pt progressing extremely well with majori improvements in activity today. Pt able to complete A-P transfer with minimal assistance x 2. Pt also able to maintain sitting balance without any physical assistance. Attempt WC transfer next session with WC education.     Follow Up Recommendations  Skilled nursing facility    Barriers to Discharge        Equipment Recommendations  Defer to next venue    Recommendations for Other Services    Frequency Min 3X/week   Plan Discharge plan remains appropriate;Frequency remains appropriate    Precautions / Restrictions Precautions Precautions: Fall Restrictions Weight Bearing Restrictions: Yes RLE Weight Bearing: Non weight bearing       Mobility  Bed Mobility Bed Mobility: Supine to Sit;Sit to Supine;Rolling Right;Rolling Left Rolling Right: 4: Min guard;With rail Rolling Left: 4: Min guard;With rail Supine to Sit: 3: Mod assist;4: Min assist Sit to Supine: 3: Mod assist Details for Bed Mobility Assistance: VC for sequencing throughout transfer. Upon sitting, noted pt with BM, therefore layed back down and completed hygiene. Pt able to complete majority of transfer with assist of LLE and minimal trunk support. Pt able to complete second supine to sit with only min assist of LLE, no trunk support Transfers Transfers: Risk manager: 1: +2 Total assist Anterior-Posterior Transfers: Patient Percentage: 70% Details for Transfer Assistance: +2 for support, although pt completed majority of transfer. Pt with good UE strength. Assist with LLE control. VC throughout for proper sequencing.  Ambulation/Gait Ambulation/Gait Assistance: Not tested (comment)      Exercises Amputee Exercises Straight Leg Raises: Right;Strengthening;AROM;10 reps (pt able to complete 70% of range)   PT Goals Acute Rehab PT Goals PT Goal Formulation: With patient PT Goal: Rolling Supine to Right Side - Progress: Progressing toward goal PT Goal: Rolling Supine to Left Side - Progress: Progressing toward goal PT Goal: Supine/Side to Sit - Progress: Met PT Goal: Sit at Edge Of Bed - Progress: Progressing toward goal PT Transfer Goal: Bed to Chair/Chair to Bed - Progress: Progressing toward goal  Visit Information  Last PT Received On: 02/16/12 Assistance Needed: +1    Subjective Data  Subjective: Pt extremely willing to participate this morning, thanking god that we were there to get her up   Cognition  Overall Cognitive Status: Appears within functional limits for tasks assessed/performed Orientation Level: Oriented X4 / Intact Behavior During Session: [facility] for tasks performed    Balance  Balance Balance Assessed: Yes Static Sitting Balance Static Sitting - Balance Support: Bilateral upper extremity supported;Feet supported Static Sitting - Level of Assistance: 5: Stand by assistance Static Sitting - Comment/# of Minutes: Pt able to sit EOB without any physical assist.  End of Session PT - End of Session Equipment Utilized During Treatment: Left ankle foot orthosis Activity Tolerance: Patient tolerated treatment well Patient left: in chair;with call bell/phone within reach;with nursing in room Nurse Communication: Mobility status    Milana Kidney 02/16/2012, 8:34 AM  02/16/2012 Milana Kidney DPT PAGER: [phone] OFFICE: [phone]

## 2012-02-16 NOTE — Progress Notes (Signed)
Pt having difficulty voiding post foley. Bladder scanner showed 360cc of urine, bladder is distended. Per pt and family, pt catheterizes self about 4 times a day. MD called and PRN cath order initiated.   Pt had 900cc urine output with I/O cath.

## 2012-02-16 NOTE — Consult Note (Signed)
Consult received and appreciated. Pt however is too sleepy for consult at this time. Will return to visit pt later.

## 2012-02-16 NOTE — Procedures (Signed)
EEG NUMBER:  REFERRING PHYSICIAN:  Veto Kemps, MD  HISTORY:  A 66 year old female with altered mental status.  MEDICATIONS:  Norvasc, Lipitor, Neurontin, Synthroid, OxyIR.  CONDITIONS OF RECORDING:  This is a 16 channel EEG carried out with the patient in the poorly responsive state.  DESCRIPTION:  Background activity is slow and poorly organized.  There is a mixture of delta and theta rhythms noted over both hemispheres. Superimposed, there is some low-voltage triphasic transients noted but these are only occasional and fairly rare throughout the tracing.  More frequently sharp activity that is more resembling a vertex central sharp transient.  Other sleep transients are noted during the tracing as well. There are periods during the tracing when the patient is stimulated and becomes more alert.  During these episodes, theta activity is more prominent with a maximum frequency of approximately 6 Hz noted.  There is artifact noted during these periods as well.  Hypoventilation was not performed.  Intermittent photic stimulation failed to elicit any change in the tracing.  IMPRESSION:  This is an abnormal EEG secondary to general background slowing.  Some occasional triphasic transients were noted as well. Despite the abnormality of the triphasic and the general background slowing, this record is improved from the previous two EEGs that were performed during this hospitalization.          ______________________________ Thana Farr, MD    [id number] D:  02/12/2012 18:37:14  T:  02/12/2012 21:12:40  Job #:  [id number]

## 2012-02-16 NOTE — Progress Notes (Signed)
Called by RN to assess patient for second set of eyes.  Upon arrival to patients room, RN at bedside.   Patient lying in bed.  Patient is extremely lethargic and easily arousable, patient unable to complete sentences or answer questions without falling back to sleep.  Patient is oriented, MAE, denies pain or SOB.  Patient states she is just tired.  VSS.  MD paged and notified.  No RRT interventions, RN to call if assistance needed

## 2012-02-16 NOTE — Progress Notes (Signed)
Speech Language Pathology Dysphagia Treatment Patient Details Name: Alexandra Wiley MRN: [id number] DOB: 1946-09-03 Today's Date: 02/16/2012 Time: 4696-2952 SLP Time Calculation (min): 18 min  Assessment / Plan / Recommendation Clinical Impression  F/u diet tolerance assessment after MBS revealed no overt s/s of aspiration with patient self feeding clinician provided po trials with independent use of general safe swallowing strategies. Overall patient appears to be tolerating current diet which is now mechanical soft per patient request. No further skilled SLP f/u indicated at this time. Based on results of MBS, patient is safe to advance to a regular diet if she chooses. SLP signing off.     Diet Recommendation  Continue with Current Diet: Dysphagia 3 (mechanical soft);Thin liquid    SLP Plan All goals met   Pertinent Vitals/Pain n/a   Swallowing Goals  SLP Swallowing Goals Patient will consume recommended diet without observed clinical signs of aspiration with: Moderate assistance Swallow Study Goal #1 - Progress: Met Patient will utilize recommended strategies during swallow to increase swallowing safety with: Moderate assistance Swallow Study Goal #2 - Progress: Met  General Temperature Spikes Noted: No Respiratory Status: Room air Behavior/Cognition: Alert;Cooperative;Pleasant mood;Confused Oral Cavity - Dentition: Dentures, top;Dentures, bottom Patient Positioning: Upright in chair      Dysphagia Treatment Treatment focused on: Skilled observation of diet tolerance Treatment Methods/Modalities: Skilled observation Patient observed directly with PO's: Yes Type of PO's observed: Regular;Thin liquids Feeding: Able to feed self Liquids provided via: [employer] MA, CCC-SLP [phone]  Alexandra Wiley Alexandra Wiley 02/16/2012, 10:40 AM

## 2012-02-16 NOTE — Progress Notes (Signed)
Subjective:  Patient awake and alert. Able to follow commands. Oriented.  Objective: BP 122/46  Pulse 56  Temp(Src) 97.8 F (36.6 C) (Oral)  Resp 18  Ht 4\' 11"  (1.499 m)  Wt 77.5 kg (170 lb 13.7 oz)  BMI 34.51 kg/m2  SpO2 100%  CBGs  Basename 02/16/12 0740 02/15/12 2117 02/15/12 1635 02/15/12 1240 02/15/12 1009 02/15/12 0336 02/14/12 2311 02/14/12 1951 02/14/12 1615 02/14/12 1142  GLUCAP 90 195* 148* 148* 114* 124* 140* 183* 120* 176*     Medications: Scheduled:   . amLODipine  5 mg Oral Daily  . antiseptic oral rinse  15 mL Mouth Rinse QID  . aspirin  81 mg Oral Daily  . atorvastatin  10 mg Oral Daily  . baclofen  10 mg Oral BID  . gabapentin  300 mg Oral QID  . heparin  5,000 Units Subcutaneous Q8H  . insulin aspart  0-15 Units Subcutaneous TID WC  . insulin glargine  15 Units Subcutaneous Daily  . levothyroxine  75 mcg Oral QAC breakfast  . metoprolol succinate  25 mg Oral Daily  . pantoprazole sodium  40 mg Per Tube QHS  . sodium chloride  3 mL Intravenous Q12H  . valproate sodium  1,500 mg Intravenous Q12H  . DISCONTD: chlorhexidine  15 mL Mouth Rinse BID  . DISCONTD: feeding supplement  30 mL Per Tube TID WC  . DISCONTD: feeding supplement (PROMOTE)  1,000 mL Per Tube Q24H  . DISCONTD: free water  250 mL Per Tube Q4H  . DISCONTD: hydrALAZINE  25 mg Oral Q8H  . DISCONTD: insulin aspart  0-15 Units Subcutaneous Q4H  . DISCONTD: insulin aspart  0-15 Units Subcutaneous TID WC  . DISCONTD: meropenem (MERREM) IV  500 mg Intravenous Q12H  . DISCONTD: vancomycin  1,500 mg Intravenous Q36H    History obtained from the patient  General ROS: negative for - chills, fatigue, fever, night sweats, weight gain or weight loss Psychological ROS: negative for - behavioral disorder, hallucinations, memory difficulties, mood swings or suicidal ideation Ophthalmic ROS: negative for - blurry vision, double vision, eye pain or loss of vision ENT ROS: negative for - epistaxis,  nasal discharge, oral lesions, sore throat, tinnitus or vertigo Allergy and Immunology ROS: negative for - hives or itchy/watery eyes Hematological and Lymphatic ROS: negative for - bleeding problems, bruising or swollen lymph nodes Endocrine ROS: negative for - galactorrhea, hair pattern changes, polydipsia/polyuria or temperature intolerance Respiratory ROS: negative for - cough, hemoptysis, shortness of breath or wheezing Cardiovascular ROS: negative for - chest pain, dyspnea on exertion, edema or irregular heartbeat Gastrointestinal ROS: negative for - abdominal pain, diarrhea, hematemesis, nausea/vomiting or stool incontinence Genito-Urinary ROS: negative for - dysuria, hematuria, incontinence or urinary frequency/urgency Musculoskeletal ROS: negative for - joint swelling or muscular weakness Neurological ROS: as noted in HPI Dermatological ROS: negative for rash and skin lesion changes    Neurologic Exam: Mental Status: Alert, oriented, thought content appropriate.  Speech fluent without evidence of aphasia. Able to follow 3 step commands without difficulty. Cranial Nerves: II- Visual fields grossly intact. III/IV/VI-Extraocular movements intact.  Pupils reactive bilaterally. V/VII-Smile symmetric VIII-hearing grossly intact IX/X-normal gag XI-bilateral shoulder shrug XII-midline tongue extension Motor: 5/5 bilaterally with normal tone and bulk Sensory: Light touch intact throughout, bilaterally Deep Tendon Reflexes: 2+ and symmetric throughout Plantars: Downgoing bilaterally Cerebellar: Normal finger-to-nose  Lab Results: CBC:  Lab 02/16/12 0647 02/15/12 0350 02/11/12 0415 02/09/12 1238  WBC 15.1* 18.4* -- --  NEUTROABS -- -- 14.3*  18.4*  HGB 8.0* 8.1* -- --  HCT 24.6* 24.6* -- --  MCV 82.8 82.3 -- --  PLT 305 303 -- --   Basic Metabolic Panel:  Lab 02/16/12 7829 02/15/12 0350  NA 139 138  K 3.6 3.2*  CL 99 99  CO2 31 30  GLUCOSE 87 116*  BUN 55* 60*    CREATININE 2.01* 1.89*  CALCIUM 8.6 8.4  MG -- --  PHOS -- --   Liver Function Tests:  Lab 02/10/12 0355  AST 17  ALT 13  ALKPHOS 90  BILITOT 0.2*  PROT 6.0  ALBUMIN 2.4*    Study Results: Dg Abd Portable 1v  02/14/2012  *RADIOLOGY REPORT*  Clinical Data: Abdominal pain.  Panda tube.  PORTABLE ABDOMEN - 1 VIEW  Comparison: 02/13/2012  Findings: The feeding tube is in place, coiled back upon itself with tip in the region of the fundus.  Bowel gas pattern is nonobstructive.  Degenerative changes are seen in the lower spine.  IMPRESSION: Feeding tube tip to the level of the gastric fundus.  Original Report Authenticated By: Patterson Hammersmith, M.D.      Assessment/Plan:  66yo multiple medical problems with acute AMS now appears cleared. Continue current Depakote dose and change to po when able.   No further neurologic intervention is recommended at this time.  If further questions arise, please call or page at that time.  Thank you for allowing neurology to participate in the care of this patient.  02/16/2012  9:40 AM      LOS: 14 days   Guy Franco [facility] Pager [phone]  02/16/2012  9:34 AM

## 2012-02-16 NOTE — Progress Notes (Addendum)
  Called to bedside by nursing staff due to pt unresponsiveness. Pt evaluated for the second time this evening. Pts husband and daughter at bedside. Spent ~74min w/ family members answering questions and concerns. Pt vs, labs, and notes reviewed w/ family and nursing staff.   Of note pt given Keppra 1500mg  PO today at 1445 and Depakote was DCd. Pt AFVSS. O2 sat 96%. Pt awoke while in room and engaged in meaningful conversation. No CN deficits. Moves UE symmetrically.   Symptoms likely from Keppra. DCd future Keppra orders restarted Depakote as PO (starting tomorrow at 10:00). Neuro checks w/ VS and pulse ox Q4hrs tonight.   Shelly Flatten, MD 02/16/12

## 2012-02-16 NOTE — Progress Notes (Signed)
Seen and examined.  Agree with Dr. Gwenlyn Saran.  She was quite sleepy during my afternoon visit but finally awoke and conversed normally.  All indications are that she is returning to her baseline from her encephalopathy of uncertain cause.

## 2012-02-16 NOTE — Telephone Encounter (Signed)
I spoke with patients daughter to confirm appointment date and time.

## 2012-02-16 NOTE — Progress Notes (Signed)
In and out cath done, 700 cc of clear yellow urine out using sterile technique

## 2012-02-16 NOTE — Telephone Encounter (Signed)
Message copied by Sara Chu on Tue Feb 16, 2012  3:18 PM ------      Message from: Melene Plan      Created: Tue Feb 16, 2012 12:57 PM                   ----- Message -----         From: Lars Mage, PA         Sent: 02/16/2012  11:46 AM           To: Melene Plan, RN            Right AKA needs f/u in 2-3 weeks with Dr. Arbie Cookey.

## 2012-02-16 NOTE — Progress Notes (Signed)
Family Medicine Teaching Service [facility] Progress Note  Patient name: Alexandra Wiley Medical record number: [id number] Date of birth: [birth year] Age: 66 y.o. Gender: female    LOS: 14 days   Primary Care Provider: Laurena Slimmer, MD, MD  Overnight Events:  No acute events overnight. Patient feeling well and back to her normal self. Had modified barium swallow study yesterday and she has been taking soft food without trouble. Says she wants to start back slow.  Objective: Vital signs in last 24 hours: Temp:  [97.2 F (36.2 C)-98.8 F (37.1 C)] 97.8 F (36.6 C) (05/28 0500) Pulse Rate:  [56-84] 56  (05/28 0500) Resp:  [17-18] 18  (05/28 0500) BP: (99-128)/(46-63) 122/46 mmHg (05/28 0500) SpO2:  [94 %-100 %] 100 % (05/28 0500)  Wt Readings from Last 3 Encounters:  02/15/12 170 lb 13.7 oz (77.5 kg)  02/15/12 170 lb 13.7 oz (77.5 kg)  02/15/12 170 lb 13.7 oz (77.5 kg)     PE: Filed Vitals:   02/15/12 1242 02/15/12 1400 02/15/12 2100 02/16/12 0500  BP: 128/56 114/63 99/61 122/46  Pulse:  84 69 56  Temp: 97.2 F (36.2 C) 97.6 F (36.4 C) 98.8 F (37.1 C) 97.8 F (36.6 C)  TempSrc:  Oral Oral Oral  Resp: 17 18 18 18   Height:      Weight:      SpO2: 95% 94% 96% 100%   Gen: awake, alert and oriented. Talkative and in good spirits. HEENT: moist mucous membranes CV: S1S2, RRR, 3/6 systolic murmur unchanged from previous Res: scattered expiratory wheezing.  Abd: soft, non distended, present bowel sounds Ext/Musc: left heel elevated with boot in place. right stump clean and dry. No drainage or erythema. Staples in place. No pain with palpation  Labs/Studies:  Basic Metabolic Panel:    Component Value Date/Time   NA 139 02/16/2012 0647   K 3.6 02/16/2012 0647   CL 99 02/16/2012 0647   CO2 31 02/16/2012 0647   BUN 55* 02/16/2012 0647   CREATININE 2.01* 02/16/2012 0647   GLUCOSE 87 02/16/2012 0647   CALCIUM 8.6 02/16/2012 0647   CBC:    Component Value Date/Time   WBC  15.1* 02/16/2012 0647   HGB 8.0* 02/16/2012 0647   HCT 24.6* 02/16/2012 0647   PLT 305 02/16/2012 0647   MCV 82.8 02/16/2012 0647   NEUTROABS 14.3* 02/11/2012 0415   LYMPHSABS 3.6 02/11/2012 0415   MONOABS 3.2* 02/11/2012 0415   EOSABS 0.0 02/11/2012 0415   BASOSABS 0.0 02/11/2012 0415    Chest X-ray: Endotracheal tube terminates 4.5 cm above the carina. Pulmonary vascular congestion without frank interstitial edema, improved.   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 Was transferred to [facility] service due to concern for sepsis. 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 Was re-intubated on 5/18 because of unresponsiveness, decreased sensorium and extubated on 02/13/12.   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 MRI on 5/20 did not show any findings of osteomyelitis post right above knee. Questionable myofasciitis in both thighs.  - blood cultures neg x5days.  - sputum cultures negative - vanc and meropenem stopped yesterday (vanc: 5/14-5/27; meropenem 5/20-5/27; was on zosyn from 5/14-5/20, as well as imipenem from 5/20 to 5/23). No evidence of infection at this time. Afebrile with steadily improving WBC.    #  acute encephalopathy:  Patient intubated 5/18; likely with metabolic encephalopathy. Was extubated on 05/25 Neurology on board. Negative MRI, CT. Initial EEG showing moderate encephalopathy of nonspecific etiology.  Latest EEG showed improvement compared to previous. Patient appears at baseline mental status  - will switch to oral depakote today   # Peripheral vascular disease:  - continue to monitor left heal and foot  - follow up vascular surgery recommendations for potential revascularization of left leg in the future as an outpatient - reiterated to patient  importance of smoking cessation  - will order smoking cessation education   # Acute renal insufficiency: likely from hypovolemia and ATN. Today at 2.01 compared to 1.89 yesterday. Continue monitoring. Will not start lisinopril at this time although she would most likely benefit from it since she is diabetic.  # Acute Respiratory failure 2/2 acute encephalopathy:  - CT chest on 5/20 showed patchy airspace disease in the left upper lobe with basilar consolidation suggestive of pneumonia. - extubation on 5/25. Currently on RA - will order albuterol as needed given wheezing on exam.   # Diabetes:   CBG (last 3)   Basename 02/16/12 1121 02/16/12 0740 02/15/12 2117  GLUCAP 165* 90 195*   - novolog sliding scale coverage  - on Lantus 15 units   # h/o spinal cord infarct:  - continue home meds for spasms and pain: baclofen 10bid, gabapentin 300 qid  - MRI from 05/24 did not show any evidence of spinal cord infarct. 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   # diastolic CHF grade 2 diastolic dysfunction with EF of 60-65% on May 2012 echo   -  current therapy w/ amlodipine, hydralazine, ASA 81mg . HCTZ was stopped in the setting of renal failure.  - hydralazine was stopped yesterday and she was started on 25 mg metoprolol (home dose: metoprolol 50mg ). Will continue to monitor HR as she has intermittent bradycardia. Could also switch to aceI when kidney function improved.   # h/o gout: hold allopurinol. No ss of flare.  # h/o HTN: has fluctuating BP depending on which arm is used for measurement.  - continue amlodipine and metoprolol 25mg  for now #h/o hypothyroid:  - continue home levothyroxine  PPx:  - heparin sub q  PPI  Fen/GI:  Carb modified diet. KVO  Dispo: family and patient do not want SNF. Her husband is retired and can take care of her. Her daughter is available as well. They are comfortable bringing her home with home health  PT/OT. Given extensive and lengthy hospital course, will monitor patient on oral depakote and monitor vitals as BP medicines are being adjusted.  - Pt is FULL CODE   LOS 14  Signed: Marena Chancy, MD Family Medicine Resident PGY-1 [phone] 02/16/2012 12:10 PM

## 2012-02-16 NOTE — Progress Notes (Signed)
Pt is arousable but very sleepy. She will open her eyes and respond but quickly fall back asleep. VS stable. Dr. Margot Ables notified and he wanted me to reassess situation and call back if not improved for him to reassess. I called paitents daughter and she stated this has been a problem this admission. Rapid response rn notified to come and assess. Will cont to closely monitor

## 2012-02-16 NOTE — Progress Notes (Signed)
[facility] [place]  Postoperative Visit - Amputation  Date of Surgery: 02/02/2012 - 02/05/2012 Procedure(s): AMPUTATION BELOW KNEE Right Surgeon: Surgeon(s): Larina Earthly, MD POD: 11 Days Post-Op  Subjective Alexandra Wiley is a 66 y.o. female who is S/P Right Procedure(s): 02-05-2012 AMPUTATION BELOW KNEE.  Pt.denies increased pain in the stump. The patient notes pain is well controlled. Pt. denies phantom pain.  Significant Diagnostic Studies: CBC Lab Results  Component Value Date   WBC 15.1* 02/16/2012   HGB 8.0* 02/16/2012   HCT 24.6* 02/16/2012   MCV 82.8 02/16/2012   PLT 305 02/16/2012    BMET    Component Value Date/Time   NA 139 02/16/2012 0647   K 3.6 02/16/2012 0647   CL 99 02/16/2012 0647   CO2 31 02/16/2012 0647   GLUCOSE 87 02/16/2012 0647   BUN 55* 02/16/2012 0647   CREATININE 2.01* 02/16/2012 0647   CALCIUM 8.6 02/16/2012 0647   GFRNONAA 24* 02/16/2012 0647   GFRAA 28* 02/16/2012 0647    COAG Lab Results  Component Value Date   INR 1.11 02/05/2012   INR 1.16 02/03/2012   INR 1.0 11/19/2008   No results found for this basename: PTT     Intake/Output Summary (Last 24 hours) at 02/16/12 1143 Last data filed at 02/16/12 0617  Gross per 24 hour  Intake    310 ml  Output   2500 ml  Net  -2190 ml   Patient Vitals for the past 24 hrs:  Urine Occurrence Stool Color  02/16/12 0617 1  Brown  02/15/12 2030 - Brown     Physical Examination  BP Readings from Last 3 Encounters:  02/16/12 122/46  02/16/12 122/46  02/16/12 122/46   Temp Readings from Last 3 Encounters:  02/16/12 97.8 F (36.6 C) Oral  02/16/12 97.8 F (36.6 C) Oral  02/16/12 97.8 F (36.6 C) Oral   SpO2 Readings from Last 3 Encounters:  02/16/12 100%  02/16/12 100%  02/16/12 100%   Pulse Readings from Last 3 Encounters:  02/16/12 56  02/16/12 56  02/16/12 56    Pt is A&Ox3  WDWN female with no complaints  Right amputation wound is healing well.  There is  good  bone coverage in the stump Stump is warm and well perfused, without drainage; without erythema   Assessment/plan:  Alexandra Wiley is a 66 y.o. female who is s/p Right Procedure(s): AMPUTATION BELOW KNEE  The patient's stump is viable.  Follow-up 4 weeks from surgery  Clinton Gallant [facility] 11:43 AM 02/16/[phone]

## 2012-02-17 LAB — CBC
HCT: 29.2 % — ABNORMAL LOW (ref 36.0–46.0)
Hemoglobin: 9.3 g/dL — ABNORMAL LOW (ref 12.0–15.0)
MCV: 83.2 fL (ref 78.0–100.0)
RBC: 3.51 MIL/uL — ABNORMAL LOW (ref 3.87–5.11)
WBC: 15 10*3/uL — ABNORMAL HIGH (ref 4.0–10.5)

## 2012-02-17 LAB — GLUCOSE, CAPILLARY: Glucose-Capillary: 93 mg/dL (ref 70–99)

## 2012-02-17 LAB — BASIC METABOLIC PANEL
CO2: 29 mEq/L (ref 19–32)
Chloride: 104 mEq/L (ref 96–112)
Creatinine, Ser: 2 mg/dL — ABNORMAL HIGH (ref 0.50–1.10)
Sodium: 144 mEq/L (ref 135–145)

## 2012-02-17 MED ORDER — GABAPENTIN 100 MG PO CAPS
100.0000 mg | ORAL_CAPSULE | Freq: Three times a day (TID) | ORAL | Status: DC
  Filled 2012-02-17: qty 1

## 2012-02-17 MED ORDER — GLUCERNA SHAKE PO LIQD
237.0000 mL | Freq: Two times a day (BID) | ORAL | Status: DC
  Administered 2012-02-17 – 2012-02-22 (×8): 237 mL via ORAL

## 2012-02-17 MED ORDER — GABAPENTIN 100 MG PO CAPS
100.0000 mg | ORAL_CAPSULE | Freq: Three times a day (TID) | ORAL | Status: DC
  Administered 2012-02-17 – 2012-02-18 (×2): 100 mg via ORAL
  Filled 2012-02-17 (×4): qty 1

## 2012-02-17 NOTE — Progress Notes (Signed)
02-17-12 1401 Lang Zingg Graves-Bigelow, RN,BSN [phone] Pt was discussed in the long length of stay meeting today.   

## 2012-02-17 NOTE — Progress Notes (Signed)
Family Medicine Teaching Service [facility] Progress Note  Patient name: Alexandra Wiley Medical record number: [id number] Date of birth: 1946/08/02 Age: 66 y.o. Gender: female    LOS: 15 days   Primary Care Provider: Laurena Slimmer, MD, MD  Overnight Events:  Received dose of keppra po yesterday which made patient very sleepy. Keppra was discontinued and patient is much more alert today. She denies any pain.  Objective: Vital signs in last 24 hours: Temp:  [97.6 F (36.4 C)-98.2 F (36.8 C)] 98.2 F (36.8 C) (05/29 0500) Pulse Rate:  [52-75] 75  (05/29 0500) Resp:  [16-18] 16  (05/29 0500) BP: (91-104)/(56-62) 102/58 mmHg (05/29 0500) SpO2:  [96 %-98 %] 96 % (05/29 0500) Weight:  [169 lb 14.4 oz (77.066 kg)] 169 lb 14.4 oz (77.066 kg) (05/29 0500)  Wt Readings from Last 3 Encounters:  02/17/12 169 lb 14.4 oz (77.066 kg)  02/17/12 169 lb 14.4 oz (77.066 kg)  02/17/12 169 lb 14.4 oz (77.066 kg)   PE: Filed Vitals:   02/16/12 2300 02/17/12 0100 02/17/12 0300 02/17/12 0500  BP: 98/62 98/56 100/60 102/58  Pulse: 57 52 65 75  Temp: 97.9 F (36.6 C) 97.7 F (36.5 C) 97.6 F (36.4 C) 98.2 F (36.8 C)  TempSrc: Oral Oral Oral Oral  Resp: 16 16 16 16   Height:      Weight:    169 lb 14.4 oz (77.066 kg)  SpO2: 96% 98% 96% 96%   Gen: awake, alert and oriented to person, time and place. Still drowsy but able to talk and follow command.  HEENT: dry mucous membranes CV: S1S2, RRR, 3/6 systolic murmur unchanged from previous Res: no wheezing appreciated on exam this morning  Abd: soft, non distended, present bowel sounds Ext/Musc: left heel elevated with boot in place. right stump clean and dry. No drainage or erythema. Staples in place. No pain with palpation  Labs/Studies:  Basic Metabolic Panel:    Component Value Date/Time   NA 144 02/17/2012 0610   K 3.6 02/17/2012 0610   CL 104 02/17/2012 0610   CO2 29 02/17/2012 0610   BUN 48* 02/17/2012 0610   CREATININE 2.00* 02/17/2012  0610   GLUCOSE 85 02/17/2012 0610   CALCIUM 9.2 02/17/2012 0610   CBC:    Component Value Date/Time   WBC 15.0* 02/17/2012 0610   HGB 9.3* 02/17/2012 0610   HCT 29.2* 02/17/2012 0610   PLT 299 02/17/2012 0610   MCV 83.2 02/17/2012 0610   NEUTROABS 14.3* 02/11/2012 0415   LYMPHSABS 3.6 02/11/2012 0415   MONOABS 3.2* 02/11/2012 0415   EOSABS 0.0 02/11/2012 0415   BASOSABS 0.0 02/11/2012 0415    Chest X-ray: Endotracheal tube terminates 4.5 cm above the carina. Pulmonary vascular congestion without frank interstitial edema, improved.   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 Was transferred to [facility] service due to concern for sepsis. 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 Was re-intubated on 5/18 because of unresponsiveness, decreased sensorium and extubated on 02/13/12.   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 MRI on 5/20 did not show any findings of osteomyelitis post right above knee. Questionable myofasciitis in both thighs.  - blood cultures neg x5 days.  - sputum cultures negative - vanc and meropenem stopped on 02/15/12 (vanc: 5/14-5/27; meropenem 5/20-5/27; was on zosyn  from 5/14-5/20, as well as imipenem from 5/20 to 5/23). No evidence of infection at this time. Afebrile with steadily improving WBC.    # acute encephalopathy:  Patient intubated 5/18; likely with metabolic encephalopathy. Was extubated on 05/25. Negative MRI, CT. Initial EEG showing moderate encephalopathy of nonspecific etiology.  Latest EEG showed improvement compared to previous. With inadvertent administration of oral keppra vs depakote, patient has been a little more sleepy, but is arousable and alert and oriented x3.  - will give her oral dose of depakote po this morning. 1:1 from IV  form. Was on depakote 1500mg  IV but given sedation from keppra, will start at depakote 1000mg  po this morning.   # Peripheral vascular disease:  - continue to monitor left heal and foot  - follow up vascular surgery recommendations for potential revascularization of left leg in the future as an outpatient - patient and family are well aware of the need for patient and her family to quit smoking. Family around her smokes as well. Patient refuses nicotine patch at this time and wants to stop cold [place].   # Acute renal insufficiency: likely from hypovolemia and ATN. Stable at 2.0 today # Acute Respiratory failure 2/2 acute encephalopathy:  - CT chest on 5/20 showed patchy airspace disease in the left upper lobe with basilar consolidation suggestive of pneumonia. - extubation on 5/25. Currently on RA - had some wheezing on exam yesterday which has resolved today. No albuterol was given.   # Diabetes:   CBG (last 3)   Basename 02/17/12 0746 02/16/12 2055 02/16/12 1623  GLUCAP 93 108* 112*   - novolog sliding scale coverage  - on Lantus 15 units  - glucose controled and perhaps on the low side but this will most likely change once she goes home and eats more regular diet.    # h/o spinal cord infarct:  - continue home meds for spasms and pain: baclofen 10bid, gabapentin 300 qid  - MRI from 05/24 did not show any evidence of spinal cord infarct. 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   # diastolic CHF grade 2 diastolic dysfunction with EF of 60-65% on May 2012 echo   -  Currently on amlodipine, aspirin, atorvastatin and metoprolol 25mg  daily.  - ACEI not started at this time due to kidney injury.  - blood pressure remains on the low side: 91-104 systolic but patient is asymptomatic from this.    # h/o gout: hold allopurinol. No ss of flare.  # h/o HTN: has fluctuating BP depending on which arm is used for measurement.  - continue  amlodipine and metoprolol 25mg  for now #h/o hypothyroid:  - continue home levothyroxine  PPx:  - heparin sub q  PPI  Fen/GI:  Carb modified diet. KVO  Dispo: family and patient do not want SNF. Her husband is retired and can take care of her. Her daughter is available as well. They are comfortable bringing her home with home health PT/OT.  If patient continues to be less drowsy and tolerates her depakote today, can potentially go home today especially since family is eager to bring her home today. Her daughter has obligations today and would prefer to get her settled today. Will see if she needs ambulance transportation home.   - Pt is FULL CODE   LOS 15  Signed: Marena Chancy, MD Family Medicine Resident PGY-1 [phone] 02/17/2012 9:37 AM

## 2012-02-17 NOTE — Progress Notes (Signed)
Pt continues to be very lethargic as previously reported.  Arousable, but quickly falls back asleep. Pt able to tell me her name and where she was with clear speech. VSS. Daughter at bedside very concerned about pt's status change. MD called to bedside to speak with daughter and answer question.

## 2012-02-17 NOTE — Progress Notes (Signed)
Nutrition Follow-up  Patient extubated 5/25. S/p bedside swallow evaluation 5/25, MBSS 5/27. PO intake poor at 20% per flowsheet records. Patient's family amenable to RD ordering supplementation.  Diet Order:  Carbohydrate Modified Medium Calorie  Meds: Scheduled Meds:   . amLODipine  5 mg Oral Daily  . antiseptic oral rinse  15 mL Mouth Rinse QID  . aspirin  81 mg Oral Daily  . atorvastatin  10 mg Oral Daily  . baclofen  10 mg Oral BID  . divalproex  1,000 mg Oral Q12H  . gabapentin  300 mg Oral QID  . heparin  5,000 Units Subcutaneous Q8H  . insulin aspart  0-15 Units Subcutaneous TID WC  . insulin glargine  15 Units Subcutaneous Daily  . levothyroxine  75 mcg Oral QAC breakfast  . pantoprazole sodium  40 mg Per Tube QHS  . sodium chloride  3 mL Intravenous Q12H  . DISCONTD: levETIRAcetam  1,500 mg Oral BID  . DISCONTD: metoprolol succinate  25 mg Oral Daily   Continuous Infusions:   . sodium chloride 10 mL/hr at 02/13/12 2000   PRN Meds:.acetaminophen, albuterol, hydrALAZINE, ondansetron, sodium phosphate, DISCONTD: fentaNYL  Labs:  CMP     Component Value Date/Time   NA 144 02/17/2012 0610   K 3.6 02/17/2012 0610   CL 104 02/17/2012 0610   CO2 29 02/17/2012 0610   GLUCOSE 85 02/17/2012 0610   BUN 48* 02/17/2012 0610   CREATININE 2.00* 02/17/2012 0610   CALCIUM 9.2 02/17/2012 0610   PROT 6.0 02/10/2012 0355   ALBUMIN 2.4* 02/10/2012 0355   AST 17 02/10/2012 0355   ALT 13 02/10/2012 0355   ALKPHOS 90 02/10/2012 0355   BILITOT 0.2* 02/10/2012 0355   GFRNONAA 24* 02/17/2012 0610   GFRAA 28* 02/17/2012 0610     Intake/Output Summary (Last 24 hours) at 02/17/12 1359 Last data filed at 02/17/12 0600  Gross per 24 hour  Intake      0 ml  Output   1702 ml  Net  -1702 ml    CBG (last 3)   Basename 02/17/12 1140 02/17/12 0746 02/16/12 2055  GLUCAP 111* 93 108*    Weight Status:  77 kg (5/29) -- trending down  Re-estimated needs:  1700-1900 kcals, 90-100 gm  protein  Nutrition Dx:  Inadequate Oral Intake now r/t poor appetite as evidenced by PO intake 20%, ongoing  New Goal:  Oral intake to meet >90% of estimated nutrition needs, unmet Monitor: PO intake, weight, labs, I/O's  Intervention:    Add Glucerna Shake PO BID (220 kcals, 9.9 gm protein per 8 fl oz can)  RD to follow for nutrition care plan  Alger Memos Pager #:  [phone]

## 2012-02-17 NOTE — Progress Notes (Signed)
Orthopedic Tech Progress Note Patient Details:  Alexandra Wiley [birth year] [id number] Called bio-tech Patient ID: Alexandra Wiley, female   DOB: 23-Sep-1945, 66 y.o.   MRN: [id number]   Alexandra Wiley 02/17/2012, 12:21 PM

## 2012-02-17 NOTE — Plan of Care (Signed)
Problem: Limited Adherence to Nutrition-Related Recommendations (NB-1.6) Goal: Nutrition education Formal process to instruct or train a patient/client in a skill or to impart knowledge to help patients/clients voluntarily manage or modify food choices and eating behavior to maintain or improve health.  Outcome: Completed/Met Date Met:  02/17/12 RD consult for DM diet education. Reviewed diet guidelines and recommendations with patient's daughter. Per family interview, patient consumes regular sodas regularly; RD provided alternative diet/sugar-free options. Encouraged carbohydrate portion control at meals. Handouts provided from [employer]. Expect fair compliance. RD available to answer further questions.  Alger Memos Pager #: [phone]

## 2012-02-17 NOTE — Progress Notes (Signed)
While changing patient's bed blood noted on bed near surgical site. Will continue to monitor md paged

## 2012-02-17 NOTE — Progress Notes (Signed)
OT NOTE  PT could benefit from Rt AKA ace wrap for compression to help shape residual limb. MD please write order if you agree.    Alexandra Wiley   OTR/L Pager: [phone] Office: [phone] .

## 2012-02-17 NOTE — Progress Notes (Signed)
Patient's family now agreeing to ambulance transport home

## 2012-02-17 NOTE — Progress Notes (Addendum)
Called to see pt about drainage from right AKA stump.  Upon questioning the pt, she states that her stump "got hung on the bed" when they were moving her up in the bed.  Pt denies any other injury to stump and denies falling on stump.  RN states that when she was called to the room, there was bloody drainage on the mattress pad.    Upon inspection, staples are in tact.  There is minimal serosanguinous drainage from the lateral aspect of the wound on the bandage at this time.  Her stump appears viable.  There is minimal swelling.   Will order a retention sock for the right AKA today.  Will continue to follow wound.  RHYNE, SAMANTHA 02/17/2012 11:41 AM  Discussed with patient, son and Dr Hensel.  Right AKA healing well.  Left heel still concerning with full thickness skin loss.  Had planned angiogram but was postponed due to critical medical issues now resolved.  For angio in AM 

## 2012-02-17 NOTE — Progress Notes (Signed)
[facility] [place]  Postoperative Visit - Amputation  Date of Surgery: 02/02/2012 - 02/05/2012 Procedure(s): AMPUTATION BELOW KNEE Right Surgeon: Surgeon(s): Larina Earthly, MD POD: 12 Days Post-Op  Subjective Alexandra Wiley is a 66 y.o. female who is S/P Right Procedure(s): AMPUTATION BELOW KNEE.  Pt.denies increased pain in the stump. The patient notes pain is well controlled. Pt. denies phantom pain.   Pt is A&Ox3  WDWN female with no complaints  Right amputation wound is healing well.  There is good bone coverage in the stump Stump is warm and well perfused, without drainage; without erythema   Assessment/plan:  Alexandra Wiley is a 66 y.o. female who is s/p Right Procedure(s): AMPUTATION BELOW KNEE  The patient's stump is clean, dry, intact or viable.  Follow-up 2-3  weeks from discharge for staple removal  Clinton Gallant MAUREEN 8:41 AM 02/17/[phone]

## 2012-02-17 NOTE — Progress Notes (Signed)
Occupational Therapy Treatment Patient Details Name: Alexandra Wiley MRN: [id number] DOB: 09-04-1946 Today's Date: 02/17/2012 Time: 4098-1191 OT Time Calculation (min): 21 min  OT Assessment / Plan / Recommendation Comments on Treatment Session      Follow Up Recommendations  Skilled nursing facility    Barriers to Discharge       Equipment Recommendations  Defer to next venue    Recommendations for Other Services    Frequency Min 2X/week   Plan Discharge plan remains appropriate    Precautions / Restrictions Precautions Precautions: Other (comment);Fall (Rt AKA ) Restrictions RLE Weight Bearing: Non weight bearing   Pertinent Vitals/Pain Pt very lethargic today     ADL  Eating/Feeding: Performed;Minimal assistance Where Assessed - Eating/Feeding: Bed level Transfers/Ambulation Related to ADLs: unable to perform at this time  ADL Comments: Pt with increased lethargy today compared to evaluation. RN Shanda Bumps reports pt has been lethargic majority of the day. Pt agreeable to Eob sitting. Pt arouses to name call and oriented. Pt supine <> sit EOB x at min guard (A). Pt closing eyes and decreased arousal so returned to supine position. Pt sang a song to therapist from memory at end of session.      OT Goals Acute Rehab OT Goals OT Goal Formulation: With patient Time For Goal Achievement: 02/22/12 Potential to Achieve Goals: Good ADL Goals Pt Will Perform Grooming: with set-up;Sitting, edge of bed;Supported ADL Goal: Grooming - Progress: Progressing toward goals Pt Will Perform Upper Body Bathing: with set-up;Sitting, edge of bed;Supported Pt Will Perform Upper Body Dressing: with set-up;Sitting, bed;Supported Pt Will Transfer to Toilet: with 2+ total assist;Anterior-posterior transfer;Drop arm 3-in-1 Miscellaneous OT Goals Miscellaneous OT Goal #1: Pt will complete supine <> sit EOB total +2 pt50% as precursor to adls at EOB OT Goal: Miscellaneous Goal #1 -  Progress: Not progressing  Visit Information  Last OT Received On: 02/17/12 Assistance Needed: +2    Subjective Data      Prior Functioning       Cognition  Overall Cognitive Status: Appears within functional limits for tasks assessed/performed Arousal/Alertness: Lethargic Orientation Level: Oriented X4 / Intact Behavior During Session: Lethargic    Mobility Bed Mobility Supine to Sit: 1: +2 Total assist;HOB elevated Supine to Sit: Patient Percentage: 20% Sitting - Scoot to Edge of Bed: 1: +2 Total assist Sitting - Scoot to Edge of Bed: Patient Percentage: 0%   Exercises    Balance Static Sitting Balance Static Sitting - Balance Support: Bilateral upper extremity supported;Feet supported Static Sitting - Level of Assistance: 5: Stand by assistance Static Sitting - Comment/# of Minutes: ~5 minutes  End of Session OT - End of Session Activity Tolerance: Patient limited by fatigue Patient left: in bed;with call bell/phone within reach Nurse Communication: Precautions   Lucile Shutters 02/17/2012, 3:26 PM Pager: [phone]

## 2012-02-17 NOTE — H&P (Signed)
Spoke to family about transportation home for patient.  They refused an ambulance and stated they (patient's husband and daughter) would take her home by car.

## 2012-02-17 NOTE — Progress Notes (Signed)
Seen and examined.  Discussed in rounds.  Also discussed with Dr. Arbie Cookey who rounded at the same time I did.  She has become much more stable medically and we were aiming for DC tomorrow.  She has limb threatening ischemia in her remaining left leg.  Given Dr. Bosie Helper perspective that it has not improved with conservative therapy, we will plan arteriogram for tomorrow to see if there is any salvage revascularization that can be done on her left leg.

## 2012-02-18 ENCOUNTER — Encounter (HOSPITAL_COMMUNITY)
Admission: EM | Disposition: A | Discharge status: Home / Self Care | Payer: Self-pay | Source: Home / Self Care | Attending: Family Medicine

## 2012-02-18 DIAGNOSIS — L98499 Non-pressure chronic ulcer of skin of other sites with unspecified severity: Secondary | ICD-10-CM

## 2012-02-18 DIAGNOSIS — I739 Peripheral vascular disease, unspecified: Secondary | ICD-10-CM

## 2012-02-18 HISTORY — PX: ABDOMINAL AORTAGRAM: SHX5454

## 2012-02-18 LAB — CBC
HCT: 32.9 % — ABNORMAL LOW (ref 36.0–46.0)
HCT: 28.3 % — ABNORMAL LOW (ref 36.0–46.0)
MCHC: 32.9 g/dL (ref 30.0–36.0)
MCV: 82.5 fL (ref 78.0–100.0)
Platelets: 368 10*3/uL (ref 150–400)
RBC: 3.99 MIL/uL (ref 3.87–5.11)
RDW: 16.6 % — ABNORMAL HIGH (ref 11.5–15.5)
WBC: 16.4 10*3/uL — ABNORMAL HIGH (ref 4.0–10.5)

## 2012-02-18 LAB — GLUCOSE, CAPILLARY: Glucose-Capillary: 106 mg/dL — ABNORMAL HIGH (ref 70–99)

## 2012-02-18 LAB — CREATININE, SERUM: GFR calc non Af Amer: 26 mL/min — ABNORMAL LOW (ref >90)

## 2012-02-18 LAB — BASIC METABOLIC PANEL
BUN: 36 mg/dL — ABNORMAL HIGH (ref 6–23)
CO2: 25 mEq/L (ref 19–32)
Chloride: 103 mEq/L (ref 96–112)
Creatinine, Ser: 2 mg/dL — ABNORMAL HIGH (ref 0.50–1.10)

## 2012-02-18 SURGERY — ABDOMINAL AORTAGRAM
Anesthesia: LOCAL

## 2012-02-18 MED ORDER — ONDANSETRON HCL 4 MG/2ML IJ SOLN: 4.0000 mg | Freq: Four times a day (QID) | INTRAMUSCULAR | Status: DC | PRN

## 2012-02-18 MED ORDER — ENOXAPARIN SODIUM 40 MG/0.4ML ~~LOC~~ SOLN
40.0000 mg | SUBCUTANEOUS | Status: DC
  Administered 2012-02-19 – 2012-02-20 (×2): 40 mg via SUBCUTANEOUS
  Filled 2012-02-18 (×4): qty 0.4

## 2012-02-18 MED ORDER — ACETAMINOPHEN 325 MG PO TABS: 650.0000 mg | ORAL_TABLET | ORAL | Status: DC | PRN

## 2012-02-18 MED ORDER — HYDRALAZINE HCL 20 MG/ML IJ SOLN
INTRAMUSCULAR | Status: AC
  Filled 2012-02-18: qty 1

## 2012-02-18 MED ORDER — SODIUM CHLORIDE 0.9 % IV SOLN
1.0000 mL/kg/h | INTRAVENOUS | Status: AC
  Administered 2012-02-18: 1 mL/kg/h via INTRAVENOUS

## 2012-02-18 NOTE — Progress Notes (Signed)
Family Medicine Teaching Service [facility] Progress Note  Patient name: Alexandra Wiley Medical record number: [id number] Date of birth: 09/01/1946 Age: 66 y.o. Gender: female    LOS: 16 days   Primary Care Provider: Laurena Slimmer, MD, MD  Overnight Events:  Patient remains drowsy this morning. Otherwise, not having any pain. Yesterday, snagged staple from stump on sheet and had some bleeding from the surgical site. Vascular surgery evaluated her. She has not had any bleeding since. Scheduled for arteriogram of her left leg today.  Objective: Vital signs in last 24 hours: Temp:  [98.2 F (36.8 C)-98.7 F (37.1 C)] 98.4 F (36.9 C) (05/30 0800) Pulse Rate:  [69-84] 84  (05/30 0800) Resp:  [18-20] 20  (05/30 0800) BP: (106-160)/(54-80) 158/72 mmHg (05/30 0800) SpO2:  [96 %-100 %] 100 % (05/30 0800) Weight:  [172 lb (78.019 kg)] 172 lb (78.019 kg) (05/30 0822)  Wt Readings from Last 3 Encounters:  02/18/12 172 lb (78.019 kg)  02/18/12 172 lb (78.019 kg)  02/18/12 172 lb (78.019 kg)   PE: Filed Vitals:   02/18/12 0000 02/18/12 0400 02/18/12 0800 02/18/12 0822  BP: 138/76 137/80 158/72   Pulse: 79 84 84   Temp: 98.2 F (36.8 C) 98.4 F (36.9 C) 98.4 F (36.9 C)   TempSrc: Oral Oral Oral   Resp: 18 18 20    Height:      Weight:    172 lb (78.019 kg)  SpO2: 97% 98% 100%    Gen: awake, alert and oriented to person, time and place. Drowsy and falls asleep repeatedly and often but is able to wake up and follow commands and answer questions.  HEENT: dry mucous membranes CV: S1S2, RRR, 3/6 systolic murmur unchanged from previous Res: cta b/l Abd: soft, non distended, present bowel sounds Ext/Musc: left heel elevated with boot in place. Right stump clean, dry, no drainage or blood. Staples in place. No pain with palpation  Labs/Studies:  Basic Metabolic Panel:    Component Value Date/Time   NA 143 02/18/2012 0550   K 3.8 02/18/2012 0550   CL 103 02/18/2012 0550   CO2 25  02/18/2012 0550   BUN 36* 02/18/2012 0550   CREATININE 2.00* 02/18/2012 0550   GLUCOSE 104* 02/18/2012 0550   CALCIUM 9.4 02/18/2012 0550   CBC:    Component Value Date/Time   WBC 16.4* 02/18/2012 0550   HGB 10.7* 02/18/2012 0550   HCT 32.9* 02/18/2012 0550   PLT 364 02/18/2012 0550   MCV 82.5 02/18/2012 0550   NEUTROABS 14.3* 02/11/2012 0415   LYMPHSABS 3.6 02/11/2012 0415   MONOABS 3.2* 02/11/2012 0415   EOSABS 0.0 02/11/2012 0415   BASOSABS 0.0 02/11/2012 0415    Chest X-ray: Endotracheal tube terminates 4.5 cm above the carina. Pulmonary vascular congestion without frank interstitial edema, improved.   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 Was transferred to [facility] service due to concern for sepsis. 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 Was re-intubated on 5/18 because of unresponsiveness, decreased sensorium and extubated on 02/13/12.   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 MRI on 5/20 did not show any findings of osteomyelitis post right above knee. Questionable myofasciitis in both thighs.  - blood cultures neg x5 days.  - sputum cultures negative - vanc and meropenem stopped on 02/15/12 (  vanc: 5/14-5/27; meropenem 5/20-5/27; was on zosyn from 5/14-5/20, as well as imipenem from 5/20 to 5/23). No evidence of infection at this time. Afebrile with stable WBC: 16.4 today compared to 15.0 yesterday  # acute encephalopathy:  Patient intubated 5/18; likely with metabolic encephalopathy. Was extubated on 05/25. Negative MRI, CT. Initial EEG showing moderate encephalopathy of nonspecific etiology.  Latest EEG showed improvement compared to previous. Patient remains sleepy and drowsy. This could potentially still be from Keppra dose given on 5/28 at 2pm although  this should technically have cleared. Will consult with pharmacy as to the half life of Keppra with creatinine of 2. Given renal clearance, it may still be in her system - continue depakote oral 1000mg  bid - gabapentin on reduced dose yesterday due to sleepiness. Will d/c today. - called neurology about recommendation for duration of depakote. Recommend neuro follow up in the next couple of weeks with continuation of depakote.   # Peripheral vascular disease:  - left leg arteriogram today per vascular surgery-will follow up Creatinine post op.  - continue encouraging smoking cessation # Acute renal insufficiency: likely from hypovolemia and ATN. Stable at 2.0 # Acute Respiratory failure 2/2 acute encephalopathy:  - CT chest on 5/20 showed patchy airspace disease in the left upper lobe with basilar consolidation suggestive of pneumonia. - extubation on 5/25. Currently on RA  # Diabetes:   CBG (last 3)   Basename 02/18/12 0721 02/17/12 1633 02/17/12 1140  GLUCAP 97 110* 111*   - novolog sliding scale coverage  - on Lantus 15 units  - will discharge patient on lantus 15u. She was on 70/30 at home, but this   # h/o spinal cord infarct:  - continue home meds for spasms and pain: baclofen 10bid, gabapentin 300 qid  - MRI from 05/24 did not show any evidence of spinal cord infarct. 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   # diastolic CHF grade 2 diastolic dysfunction with EF of 60-65% on May 2012 echo   -  Currently on amlodipine, aspirin, atorvastatin - metoprolol stopped yesterday.   # h/o gout: hold allopurinol. No ss of flare.  # h/o HTN: has fluctuating BP depending on which arm is used for measurement. BP is stable in the 130's systolic.  - continue amlodipine  #h/o hypothyroid:  - continue home levothyroxine  PPx:  - heparin sub q  PPI  Fen/GI:  Carb modified diet. KVO  Dispo: family and patient do not want SNF at  this point. Her husband is retired and can take care of her. Her daughter is available as well. They are comfortable bringing her home with home health PT/OT. Arteriogram today. Will talk with family about getting palliative care consult to talk about goals of care.   - Pt is FULL CODE   LOS 16  Signed: Marena Chancy, MD Family Medicine Resident PGY-1 [phone] 02/18/2012 9:24 AM

## 2012-02-18 NOTE — H&P (View-Only) (Signed)
Called to see pt about drainage from right AKA stump.  Upon questioning the pt, she states that her stump "got hung on the bed" when they were moving her up in the bed.  Pt denies any other injury to stump and denies falling on stump.  RN states that when she was called to the room, there was bloody drainage on the mattress pad.    Upon inspection, staples are in tact.  There is minimal serosanguinous drainage from the lateral aspect of the wound on the bandage at this time.  Her stump appears viable.  There is minimal swelling.   Will order a retention sock for the right AKA today.  Will continue to follow wound.  Doreatha Massed 02/17/2012 11:41 AM  Discussed with patient, son and Dr Leveda Anna.  Right AKA healing well.  Left heel still concerning with full thickness skin loss.  Had planned angiogram but was postponed due to critical medical issues now resolved.  For angio in AM

## 2012-02-18 NOTE — Progress Notes (Signed)
Orthopedic Tech Progress Note Patient Details:  Alexandra Wiley 09/25/1945 [id number]  Patient ID: [name], female   DOB: 1945/11/30, 66 y.o.   MRN: [id number] Brace order completed by Storm Frisk, Dayane Hillenburg 02/18/2012, 7:17 PM

## 2012-02-18 NOTE — Progress Notes (Signed)
Began discussion of goals of care with family while patient in arteriogram.  While I am in full agreement with arteriogram to help define problem and treatment options, I want to be sure we have palliative care and full goals of care discussion before any further procedures.    We will continue to hold sedating meds because sleepiness continues to be an issue.

## 2012-02-18 NOTE — Op Note (Signed)
PATIENT: Alexandra Wiley   MRN: [id number] DOB: 04/05/1946    DATE OF PROCEDURE: 02/18/2012  INDICATIONS: INGE WALDROUP is a 66 y.o. female with a nonhealing left heel decubitus. She had mild renal insufficiency and therefore elected to use CO2 with minimal dye.  PROCEDURE:  1. Ultrasound-guided access to the right common femoral artery 2. Selective catheterization of the left common iliac artery with iliac arteriogram using CO2 3. Selective catheterization of the left external iliac artery and left superficial femoral artery. 4. CO2 arteriogram of the left lower extremity. 5. A total of 14 cc of contrast was used.  SURGEON: Di Kindle. Edilia Bo, MD, FACS  ANESTHESIA: local   EBL: minimal  TECHNIQUE: The patient was taken to the peripheral vascular lab. The right groin was prepped and draped in the usual sterile fashion. Under ultrasound guidance, and after the skin was anesthetized, the right common femoral artery was cannulated and a guidewire introduced into the infrarenal aorta under fluoroscopic control. A 5 French sheath was introduced over the wire. A crossover catheter was positioned in the proximal left common iliac artery and an iliac arteriogram was obtained using CO2 at a RAO projection. The wire was then advanced into the distal external iliac artery and across over catheter change for a long end hole catheter. Selective left external iliac arteriogram was obtained using CO2. The wire was then advanced into the superficial femoral artery and the catheter passed over the wire into the superficial femoral artery. Selective left superficial femoral artery arteriogram was obtained using CO2. Left lower extremity runoff was obtained using CO2. There was poor visualization at the very proximal superficial femoral artery and also at the adductor canal. I therefore elected to use a small amount of contrast to evaluate these areas. A total of 14 cc of contrast was used.  FINDINGS:  1.  The common iliac and external iliac arteries are widely patent. 2. The common femoral and deep femoral arteries are patent. 3. There was moderate diffuse disease of the superficial femoral artery beginning at the origin of the left and extending to the adductor canal. The above-knee and below-knee popliteal artery are widely patent with minimal disease. 4. There is three-vessel runoff on the left in the anterior tibial, posterior tibial, and peroneal arteries. The plantar arch is patent.  Waverly Ferrari, MD, FACS [facility]  DATE OF DICTATION:   02/18/2012

## 2012-02-18 NOTE — Progress Notes (Signed)
PT Cancellation Note  Treatment cancelled today due to patient's refusal to participate. Patient states she feels too tired. She is also pending a left leg arteriogram today, but we will check back as time allows.  Edwyna Perfect, PT  Pager [phone]  02/18/2012, 8:54 AM

## 2012-02-18 NOTE — Progress Notes (Signed)
Clinical Child psychotherapist (CSW) has observed progress notes and the concern with pt returning home in the state she is in. CSW contacted pt spouse home number however was unable to reach spouse. CSW later received a call from pt daughter Alexandra Wiley [phone] who informed CSW that spouse is in a doctors appointment. Alexandra Wiley is pt daughter that lives with pt from Sunday-Thursday /24hr care. CSW addressed concerns with pt returning home vs going to a SNF. 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 Alexandra Wiley also stated that pt would do better at home simply because she would be in a familiar and supportive environment. Alexandra Wiley stated that pt has spoken to her and spouse and informed them that she does not want to go to a facility as her mother was placed in a facility not by choice. Alexandra Wiley stated she and pt spouse are aware that pt will be going into a procedure today and could possibly have another amputation and at that point they may consider a facility. CSW asked Alexandra Wiley to have her father contact CSW to discuss plan B for pt/ faxing pt out to facilities in the instance that family decides that placement is a better option. Daughter will relay message to pt spouse who will be in touch with CSW.  Pt dc plan remains home with HH. CSW has not received consent to fax pt out to facilities.   Theresia Bough, MSW, Theresia Majors [phone]

## 2012-02-18 NOTE — Interval H&P Note (Signed)
History and Physical Interval Note:  02/18/2012 1:17 PM  Alexandra Wiley  has presented today for surgery, with the diagnosis of PVD  The various methods of treatment have been discussed with the patient and family. After consideration of risks, benefits and other options for treatment, the patient has consented to: ABDOMINAL AORTAGRAM AND LEFT LOWER EXTREMITY RUNOFF. The patients' history has been reviewed, patient examined, no change in status, stable for surgery.  I have reviewed the patients' chart and labs.  Questions were answered to the patient's satisfaction.     Ndeye Tenorio S

## 2012-02-19 LAB — GLUCOSE, CAPILLARY: Glucose-Capillary: 109 mg/dL — ABNORMAL HIGH (ref 70–99)

## 2012-02-19 LAB — BASIC METABOLIC PANEL
BUN: 29 mg/dL — ABNORMAL HIGH (ref 6–23)
CO2: 25 mEq/L (ref 19–32)
Chloride: 108 mEq/L (ref 96–112)
Creatinine, Ser: 1.9 mg/dL — ABNORMAL HIGH (ref 0.50–1.10)
GFR calc Af Amer: 30 mL/min — ABNORMAL LOW (ref >90)

## 2012-02-19 LAB — CBC
HCT: 29 % — ABNORMAL LOW (ref 36.0–46.0)
MCHC: 32.1 g/dL (ref 30.0–36.0)
MCV: 83.1 fL (ref 78.0–100.0)
RDW: 16.9 % — ABNORMAL HIGH (ref 11.5–15.5)

## 2012-02-19 LAB — AMMONIA: Ammonia: 33 umol/L (ref 11–60)

## 2012-02-19 MED ORDER — WHITE PETROLATUM GEL
Status: AC
  Administered 2012-02-19: 23:00:00
  Filled 2012-02-19: qty 5

## 2012-02-19 MED ORDER — PANTOPRAZOLE SODIUM 40 MG PO TBEC
40.0000 mg | DELAYED_RELEASE_TABLET | Freq: Every day | ORAL | Status: DC
  Administered 2012-02-19 – 2012-02-22 (×4): 40 mg via ORAL
  Filled 2012-02-19 (×4): qty 1

## 2012-02-19 NOTE — Progress Notes (Signed)
Family Medicine Teaching Service [facility] Progress Note  Patient name: Alexandra Wiley Medical record number: [id number] Date of birth: 1946/09/16 Age: 66 y.o. Gender: female    LOS: 17 days   Primary Care Provider: Laurena Slimmer, MD, MD  Overnight Events:  Left leg arteriogram yesterday went well. Spoke with daughter and husband yesterday to start talking about goals of care.  Patient still remains drowsy today.    Objective: Vital signs in last 24 hours: Temp:  [97.5 F (36.4 C)-98.5 F (36.9 C)] 97.5 F (36.4 C) (05/31 0800) Pulse Rate:  [58-88] 84  (05/31 0800) Resp:  [16-18] 17  (05/31 0800) BP: (141-156)/(49-77) 142/49 mmHg (05/31 0800) SpO2:  [97 %-100 %] 99 % (05/31 0800) Weight:  [174 lb 6.4 oz (79.107 kg)] 174 lb 6.4 oz (79.107 kg) (05/31 0420)  Wt Readings from Last 3 Encounters:  02/19/12 174 lb 6.4 oz (79.107 kg)  02/19/12 174 lb 6.4 oz (79.107 kg)  02/19/12 174 lb 6.4 oz (79.107 kg)   PE: Filed Vitals:   02/18/12 2000 02/19/12 0000 02/19/12 0420 02/19/12 0800  BP: 156/60 141/59 142/49 142/49  Pulse: 88 82 68 84  Temp: 98.5 F (36.9 C) 98.5 F (36.9 C) 98 F (36.7 C) 97.5 F (36.4 C)  TempSrc: Oral Oral Oral Axillary  Resp: 16 16 18 17   Height:      Weight:   174 lb 6.4 oz (79.107 kg)   SpO2: 98% 100% 100% 99%   Gen: sleepy but wakes up when called by name. Follows command.  HEENT: dry mucous membranes CV: S1S2, RRR, 3/6 systolic murmur unchanged from previous Res: cta b/l anteriorly Abd: soft, non distended, present bowel sounds Ext/Musc: left heel elevated with boot in place. Only trace edema appreciated in left leg. Right stump wrapped. No edema noted through bandage. Clean and dry Labs/Studies:  Basic Metabolic Panel:    Component Value Date/Time   NA 145 02/19/2012 0530   K 4.0 02/19/2012 0530   CL 108 02/19/2012 0530   CO2 25 02/19/2012 0530   BUN 29* 02/19/2012 0530   CREATININE 1.90* 02/19/2012 0530   GLUCOSE 113* 02/19/2012 0530   CALCIUM  9.3 02/19/2012 0530   CBC:    Component Value Date/Time   WBC 15.5* 02/19/2012 0530   HGB 9.3* 02/19/2012 0530   HCT 29.0* 02/19/2012 0530   PLT 415* 02/19/2012 0530   MCV 83.1 02/19/2012 0530   NEUTROABS 14.3* 02/11/2012 0415   LYMPHSABS 3.6 02/11/2012 0415   MONOABS 3.2* 02/11/2012 0415   EOSABS 0.0 02/11/2012 0415   BASOSABS 0.0 02/11/2012 0415    Chest X-ray: Endotracheal tube terminates 4.5 cm above the carina. Pulmonary vascular congestion without frank interstitial edema, improved.   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 Was transferred to [facility] service due to concern for sepsis. 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 Was re-intubated on 5/18 because of unresponsiveness, decreased sensorium and extubated on 02/13/12.   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 MRI on 5/20 did not show any findings of osteomyelitis post right above knee. Questionable myofasciitis in both thighs.  - blood cultures neg x5 days.  - sputum cultures negative - vanc and meropenem stopped on 02/15/12 (vanc: 5/14-5/27; meropenem 5/20-5/27; was on zosyn from 5/14-5/20, as well as imipenem from 5/20 to 5/23).  No evidence of infection at this time.  - WBC today stable at 15.5 from 16.4 yesterday.  # acute encephalopathy:  Patient intubated 5/18; likely with metabolic encephalopathy. Was extubated on 05/25. Negative MRI, CT. Initial EEG showing moderate encephalopathy of nonspecific etiology.  Latest EEG showed improvement compared to previous. - patient remains drowsy and sleepy today. With patient's creatinine clearance, keppra should be clearing this morning.  - gapapentin was stopped yesterday - patient on depakote po 1000mg  bid. Will check depakote level today # Peripheral  vascular disease:  -left leg arteriogram showed overall patent arteries except for moderate diffuse disease of superficial femoral artery beginning at the origin of the left and extending to the adductor canal.  - will follow up with vascular surgery as to recommendations - also having meeting this morning with family to discuss goals of care regarding further procedures involving the left leg.  # Acute renal insufficiency: likely from hypovolemia and ATN. Improving today at 1.9 from 2 yesterday # Acute Respiratory failure 2/2 acute encephalopathy:  - stable on room air.   # Diabetes:   CBG (last 3)   Basename 02/19/12 0716 02/18/12 2002 02/18/12 1645  GLUCAP 107* 84 93   - novolog sliding scale coverage  - on Lantus 15 units  - patient has not eaten much yesterday which could explain low sugars  # h/o spinal cord infarct:  - continue home meds for spasms and pain: baclofen 10bid, gabapentin 300 qid  - MRI from 05/24 did not show any evidence of spinal cord infarct. 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   # diastolic CHF grade 2 diastolic dysfunction with EF of 60-65% on May 2012 echo   -  Currently on amlodipine, aspirin, atorvastatin - metoprolol stopped   # h/o gout: hold allopurinol. No ss of flare.  # h/o HTN: has fluctuating BP depending on which arm is used for measurement. BP's are a little more elevated compared to previous.   - continue amlodipine  - continue to monitor #h/o hypothyroid:  - continue home levothyroxine  PPx:  - heparin sub q  PPI  Fen/GI:  Carb modified diet. KVO  Dispo: family and patient do not want SNF at this point. Her husband is retired and can take care of her. Her daughter is available as well. They are comfortable bringing her home with home health PT/OT. Goals of care meetings taking place today.   - Pt is FULL CODE   LOS 17  Signed: Marena Chancy, MD Family Medicine  Resident PGY-1 [phone] 02/19/2012 8:50 AM

## 2012-02-19 NOTE — Progress Notes (Signed)
VASCULAR PROGRESS NOTE  Arteriogram yesterday showed moderate diffuse disease of the superficial femoral artery with three-vessel runoff on the left. A notcher that a femoropopliteal bypass would have a significant impact on her circulation as the superficial femoral artery is patent although it has moderate diffuse disease. In addition given her markedly debilitated state she was certainly be at increased risk for surgery. Dr. Arbie Cookey will follow up next week. Only consider femoropopliteal bypass grafting if the heel wound is progressing.  Waverly Ferrari, MD, FACS Beeper: [phone] 02/19/2012

## 2012-02-19 NOTE — Progress Notes (Signed)
Received report from [place], RN on 3700. Pt arrived on 5100 at 1415. VSS. Arousable.Oriented to unit.Irena Reichmann 02/19/2012

## 2012-02-19 NOTE — Progress Notes (Signed)
Spoke to Dr. Gwenlyn Saran this am.  Verbal order received to discontinue telemetry on patient

## 2012-02-19 NOTE — Progress Notes (Signed)
Seen and examined.  Ms Loomer is still sleepy.  We have DCed all meds that might cause CNS depression.  Will observe.  Perhaps reconsult neuro on Monday 6/3 if no improvement.  Arteriogram yesterday better than expected.  Vasc Surg apparently does not plan any immediate intervention.  I am still concerned with leg - especially when she is in less controled environment.  Fortunately, Creat stayed OK with dye load.  Long family meeting.  3 sons and father present in person, patient there but slept through most of meeting.  Daughter listened on phone.  As I did yesterday, I emphasized that we had no immediate crisis and no urgent decisions were needed.  Still, her decline over the past two years is evident as is her heavy burden of co-morbid disease.  It is only a matter of time before the next crisis hits and important decisions between comfort care and aggressive medical care will need to be made.    Reaction by family was mixed.  Some understood that she was medically frail and was approaching the end of her life.  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  Given the mixed reaction from the family and the hope that Ms Weisse will regain capacity soon, I did not press the issue.  I strongly recommend that we readdress this issue when Ms. Ivanov regain capacity BEFORE the next crisis hits.

## 2012-02-19 NOTE — Progress Notes (Signed)
Physical Therapy Treatment Patient Details Name: Alexandra Wiley MRN: [id number] DOB: July 26, 1946 Today's Date: 02/19/2012 Time:  09:20-35 and then 10:12-10:30    PT Assessment / Plan / Recommendation Comments on Treatment Session  Patient with decline in function today due to lethargy and poor arousal and attention. Family may require additional equipment to care for patient at home. ? consider hoyer lift. Patient currently in goals of care meeting with physicians and family, so continued care can be determined.     Follow Up Recommendations   Home health with 24 hour care    Barriers to Discharge  None      Equipment Recommendations    ? Hoyer lift   Recommendations for [employer]  None  Frequency  3 x a week   Plan Discharge plan remains appropriate;Frequency remains appropriate    Precautions / Restrictions Precautions Precautions: Fall Restrictions RLE Weight Bearing: Non weight bearing (aka)   Pertinent Vitals/Pain VSS. No reports of pain    Mobility  Bed Mobility Rolling Right: 2: Max assist Rolling Left: 2: Max assist Supine to Sit: 1: +1 Total assist;HOB elevated Supine to Sit: Patient Percentage: 10% Transfers Transfers: Counselling psychologist Transfer: 1: +2 Total assist Anterior-Posterior Transfers: Patient Percentage: 10% Details for Transfer Assistance: Returned to patient at nurses request for transfer back to bed. Attempted maxi-move but with patients level of alertness it was too difficult to place bed and therefore asssited back to bed with 3 people using P-A approach.      PT Goals Acute Rehab PT Goals PT Goal: Supine/Side to Sit - Progress: Not progressing PT Transfer Goal: Bed to Chair/Chair to Bed - Progress: Not progressing Secondary to level of arousal/attention  Visit Information   02/19/2012 +2    Subjective Data  Subjective: Family present for meeting regarding goals of care. Patient Stated Goal: None- limited  communication secondary to difficulty to arouse   Cognition  Overall Cognitive Status: Impaired Area of Impairment: Attention Arousal/Alertness: Lethargic (difficult to rouse and hard to keep awake > 30 secs) Behavior During Session: Lethargic Current Attention Level: Sustained Attention - Other Comments: Max 30 seconds    Balance   Total assistance to maintain balance at edge of bed in preparation for A-P transfer. Intermittently can sit with minimal assistance but limited by level of arousal.  End of Session PT - End of Session Activity Tolerance: Treatment limited secondary to medical complications (Comment) (Limited by level of arousal) Nurse Communication: Mobility status    Edwyna Perfect, PT  Pager [phone]  02/19/2012, 11:07 AM

## 2012-02-19 NOTE — Discharge Summary (Signed)
Physician Discharge Summary   Patient ID: Alexandra Wiley [id number] 66 y.o. Nov 08, 1945  Admit date: 02/02/2012  Discharge date and time: 02/23/2012  4:32 PM   Admitting Physician: Leighton Roach McDiarmid, MD   Discharge Physician: Leighton Roach McDiarmid, MD  Admission Diagnoses: Cellulitis [682.9] Osteomyelitis [730.20] Diabetic foot ulcer [250.80, 707.15] Peripheral Vascular Disease with gangrene  Discharge Diagnoses:  1. Right osteomyelitis - resolved 2. Right AKA 3. Acute metabolic encephalopathy 4. Peripheral vascular disease 5. Type 2 diabetes 6. History of Hypothyroid 7. Acute Renal Failure-likely secondary to ATN 8. Acute Respiratory Failure secondary to acute encephalopathy 9. History of spinal cord infarct 10. Diastolic CHF grade 2 diastolic dysfunction: EF: 60-65% on 02/04/12 echo 11. History of hypertension  Admission Condition: fair  Discharged Condition: good  Indication for Admission: osteomyelitis of the right posterior calcaneus.   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 She was initially transferred to [facility] service on 5/15 due to concern for sepsis. She was intubated on 05/18 because of unresponsiveness and decreased sensorium. Extubated on 02/13/12. She made a gradual improvement in her mentation and was n  1. Sepsis in the setting of Right posterior calcaneal osteomyelitis:  started on vanc and zosyn on admission. On 02/03/12, patient had episode of low BP in the 60's systolic with decreased alertness and responsiveness. In the setting of infection, there was concern for septic shock and patient was transferred to the ICU on the CCM service.  Patient was continued on  vanc and zosyn. Lactic acid and procalcitonin were normal. Blood cultures were drawn which showed no growth. ABI's were done to assess degree of PVD and were found to be low. Vascular surgery was called who recommended right AKA. Patient underwent surgery on 02/05/12. On 5/18, patient was intubated due to decreased sensorium, low BP, low RR and inability to protect her airway. She was started on norepinephrine and IVF. There was also concern for ongoing sepsis given low BP, elevated HR and WBC. Antibiotic therapy was adjusted to vanc and imipenem. New blood cultures were drawn which ended up also being negative. CT chest showed possible aspiration pneumonia. MRI of the thighs showed questionable myositis. Renal ultrasound did not show any evidence of hydronephrosis. When patient was transferred back to [facility] service following extubation, she was afebrile with an improving white count. Sputum and urine cultures were negative. Myositis was thought to be unlikely given no tenderness or warmth on exam. Patient had been on antibiotic therapy for 2 weeks and they were discontinued on 02/15/12. ID followed pt care from admission until DC of ABX on 02/15/12.  2. Peripheral Vascular Disease: evaluated by Dr. Arbie Cookey with vascular surgery, who recommended right AKA. ABI's performed during hospitalization averaged 0.3 bilaterally. Right AKA was performed on 5/17 and patient's stump healed well without significant pain. Left heel showed evidence of full thickness skin loss and arteriogram of the left leg was done, which showed moderate diffuse disease of the superficial femoral artery with three-vessel runoff on the left. Vascular reviewed the study and felt that the pt was not a candidate for revascularization and would likely go on to amputation in the future. The likelihood of this surgery and it's implications were discussed with the family at length.   3. Acute metabolic  encephalopathy: On postop day 1, patient became more  obtunded, with respiratory rate of 8 and hypotension. Patient had received dilaudid for pain, which was initially thought to cause decreased sensorium. Patient received narcan without any response. She was intubated for airway protection. Brain MRI, head CT did not show any acute findings. There was concern for seizure activity and EEGs were done. First 2 EEG's showed delta background with triphasic waves. Depakote was started for possible seizures. Repeat EEG was done and showed improved as well as less triphasic activity. It was unclear as to whether improvement of EEG was from patient medically improving or if depakote helped. Mental status continued to improve. Patient was extubated. Three days after extubation, patient became more sleepy. Keppra po was given inadvertently while transitioning from IV to oral anticonvulsants and it was initially thought that this may have caused sedation in addition to other sedating medications such as neurontin. However, after several days of sleepiness following the initial keppra administration, Keppra seemed a less likely cause. Ammonia level was normal. Depakote po was given and level was found to be subtherapeutic and placed on depakote po 1000mg  BID prior to discharge. Pt mentation near baseline and improving rapidly at time of discharge.   4. Acute Renal Failure-most likely ATN: following surgery, patient's Creatinine went from 0.7 to 1.25 which then increased to 2.0. This was thought to be due to ATN as well as contribution from hypovolemia. Patient's creatinine remained at 2 during hospitalization until the last 3 days w/ bump to 2.28 then drop to 2.24 on discharge. This was likely due to pt losing IV and taking a small amount of fluids PO. Pt mentation significantly improved at time of discharge and taking better po.   5. Acute Respiratory failure 2/2 acute encephalopathy: postop day 1, patient had low RR, increased O2 requirement and was intubated for airway  protection in the setting of acute encephalopathy. CT chest on 5/20 showed patchy airspace disease in the left upper lobe with basilar consolidation suggestive of pneumonia. She received antibiotics as stated above. She was extubated on 5/25 and was weaned to room air.   6.  Diabetes: A1C: 10.2 on admission. Was on 70/30 12 units daily at home. In the hospital, she was started on lantus 15u daily and insulin sliding scale. Glucose during admission remained in the 100's. on discharge, CBG's were in the high 80s to low 100s range.   7. h/o spinal cord infarct: patient was initially continued on home medications for spasms: baclofen 10 tid and gabapentin 300 qid. After long period of drowsiness however, these were discontinued.  MRI from 05/24 did not show any evidence of spinal cord infarct. 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   8. diastolic CHF grade 2 diastolic dysfunction with EF of 60-65% on May 2012 echo. Patient was on amlodipine, aspirin, atorvastatin, HCTZ and metoprolol at home. During hospitalization, HCTZ was stopped due to acute renal failure. Metoprolol was also held due to low Blood pressures and intermittent episodes of bradycardia.   9. h/o gout: there were no symptoms of flare on admission and home allopurinol was held.   10. h/o HTN: had fluctuating BP depending on which arm was used for measurement.  It appeared that patient may have a subclavian steal given discrepancy in BP reading between arms. Pts home HCTZ was held due to ARF, and Metoprolol was held due to low BP and intermittent bradycardia.  Amlodipine used for BP  control during admission as BP became elevated.   11. h/o hypothyroid:  Continued on home levothyroxine daily.  TSH was 0.374 on admission.   Consults:  CCM Neuro Infectious Diseases Palliative Care  Significant Diagnostic Studies:  MRI OF THE RIGHT FOREFOOT WITHOUT AND WITH CONTRAST  02/02/12 Technique: Multiplanar, multisequence MR imaging was performed  both before and after administration of intravenous contrast.  Contrast: 15mL MULTIHANCE GADOBENATE DIMEGLUMINE 529 MG/ML IV SOLN  Comparison: 02/02/2012  Findings: Abnormal osseous edema and enhancement noted posteriorly  in the calcaneus adjacent to the Achilles insertion site, with  adjacent subcutaneous edema. The appearance is compatible with  calcaneal osteomyelitis and adjacent cellulitis. No abscess is  observed.  There is extensive abnormal subcutaneous edema laterally and along  the dorsum of the foot, which not entirely included on today's  ankle examination. No findings of osteomyelitis involving the  distal tibia, distal fibula, talus, midfoot, or metatarsal bases.  Low-level edema and enhancement noted tracking deep to the plantar  fascia, without drainable abscess observed.  Nonstandard angulation was utilized to best evaluate the calcaneus,  and ligamentous assessment of the ankle is problematic, although no  obvious lateral ligamentous complex or deltoid ligament  discontinuity is observed, and the flexor tendons of the ankle  appear grossly intact. Spring ligament appears intact.  IMPRESSION:  1. Posterior calcaneal osteomyelitis with adjacent cellulitis.  2. Suspected low-level fasciitis tracking along the margins of the  plantar fascia.  3. Dorsal subcutaneous edema in the foot probably represents  cellulitis given the low-level associated enhancement.  4. No drainable abscess observed.  MRI HEAD WITHOUT CONTRAST 02/07/12 Technique: Multiplanar, multiecho pulse sequences of the brain and  surrounding structures were obtained according to standard protocol  without intravenous contrast.  Comparison: CT head 02/06/2012  Findings: Negative for acute infarct. Generalized atrophy. No  significant chronic ischemic changes are present. Negative for  hemorrhage or mass lesion.  Mild mastoid sinus  effusion on the left. Mild mucosal edema  paranasal sinuses. No air-fluid levels.  IMPRESSION:  Negative for acute infarct. Generalized atrophy without acute  intracranial abnormality.  CT HEAD WITHOUT CONTRAST 02/06/12 Technique: Contiguous axial images were obtained from the base of  the skull through the vertex without contrast.  Comparison: CT 11/18/2008  Findings: Age appropriate atrophy. No acute infarct, hemorrhage,  or mass. Calvarium is intact.  IMPRESSION:  No acute abnormality.  CT CHEST, ABDOMEN AND PELVIS WITHOUT CONTRAST 02/08/12 Technique: Multidetector CT imaging of the chest, abdomen and  pelvis was performed following the standard protocol without IV  contrast.  Comparison: The 04/09/2004.  CT CHEST  Findings: Endotracheal tube tip is seen in the mid trachea. Right  IJ central line tip projects at the mid SVC level. Feeding tube  tip is in the mid stomach.  No axillary lymphadenopathy. Scattered small lymph nodes are seen  in the mediastinum. No overt hilar lymphadenopathy. The heart is  enlarged. Coronary artery calcification is evident. No  pericardial effusion. Probable tiny bilateral pleural effusions.  Evaluation of fine detail in the lungs is obscured by patient  breathing motion. There is patchy airspace disease in the left  upper lobe with bibasilar collapse / consolidation.  IMPRESSION:  Patchy airspace disease in the left upper lobe suggest pneumonia.  This is associated bibasilar collapse / consolidation.  Feeding tube tip is in the mid stomach.  CT ABDOMEN AND PELVIS  Findings: Low attenuation in the liver parenchyma along the  falciform ligament may be related to an  area of fatty infiltration.  No focal abnormality in the spleen on this study performed without  intravenous contrast material. The duodenum, pancreas, and adrenal  glands are unremarkable. No stones are seen in either kidney.  Mild fullness of the right intrarenal collecting system  is evident  and there appears to be some subtle right perinephric and proximal  periureteric edema.  No abdominal aortic aneurysm. No evidence for free fluid or  lymphadenopathy in the abdomen. There is no bowel obstruction.  There is a small amount of free fluid in the pelvis. The patient  appears to have some extraperitoneal edema/inflammation in the  right retroperitoneal tissues tracking down into the right pelvic  sidewall.  Foley catheter decompresses the urinary bladder. Uterus is  unremarkable. No definite adnexal mass. No pelvic sidewall  lymphadenopathy.  Scattered diverticuli are seen in the sigmoid colon without  diverticulitis. Terminal ileum is unremarkable. The retrocecal  appendix is normal.  Bone windows show a sclerotic lesion in the left lesser trochanter,  incompletely visualized. Defect in the left iliac crest suggests  site of prior bone harvest. There is some body wall edema in the  region of the pelvis and gas in the anterior subcutaneous fat of  the lower left abdominal wall is presumably from injection site.  IMPRESSION:  No definite findings to account for the reported history of  clinical sepsis.  There is some fullness of the right intrarenal collecting system  with right periureteric edema/inflammation and apparent fluid or  inflammation in the right retroperitoneal tissues tracking down to  the right pelvic sidewall. No evidence for right psoas enlargement  suggests the presence of an underlying psoas abscess although the  evaluation is limited by the lack of intravenous contrast material.  Recent right urinary stone passage or right pyelonephritis would be  a consideration.  Sclerotic lesion in the left proximal femurs incompletely  visualized. This has relatively well defined margins were  visualized and is probably benign. Left hip films may prove  helpful to further evaluate.  Left Leg Arteriogram: 02/18/12 1. The common iliac and external iliac  arteries are widely patent.  2. The common femoral and deep femoral arteries are patent.  3. There was moderate diffuse disease of the superficial femoral artery beginning at the origin of the left and extending to the adductor canal. The above-knee and below-knee popliteal artery are widely patent with minimal disease.  4. There is three-vessel runoff on the left in the anterior tibial, posterior tibial, and peroneal arteries. The plantar arch is patent.  Discharge Exam: Gen: NAD,  HEENT: MMM  CV: RRR  HYQ:MVHQIO effort  NGE:XBMW nontender  Ext/Musc: RLE AKA w/ staples in place. Incision was c/d/i. LLE w/ air boot in place and bandaging. No signs of infection, no discharge or odor.  Neuro: AAOx3.   Disposition: 01-Home or Self CareHome w/ family. SNF was discussed and recommended to family, however family confident they would be able to take care of pt at home.   Patient Instructions:  Medication List  As of 02/25/2012 11:38 PM   STOP taking these medications         METOPROLOL TARTRATE PO      nitrofurantoin 100 MG capsule         TAKE these medications         allopurinol 100 MG tablet   Commonly known as: ZYLOPRIM   Take 100 mg by mouth daily.      amLODipine 5 MG tablet  Commonly known as: NORVASC   Take 5 mg by mouth daily.      aspirin EC 81 MG tablet   Take 81 mg by mouth daily.      atorvastatin 10 MG tablet   Commonly known as: LIPITOR   Take 10 mg by mouth daily.      baclofen 10 MG tablet   Commonly known as: LIORESAL   Take 10 mg by mouth 2 (two) times daily.      colchicine 0.6 MG tablet   Take 0.6 mg by mouth every 12 (twelve) hours as needed. For gout.      diclofenac sodium 1 % Gel   Commonly known as: VOLTAREN   Apply 1 application topically 3 (three) times daily as needed. For pain      insulin aspart protamine-insulin aspart (70-30) 100 UNIT/ML injection   Commonly known as: NOVOLOG 70/30   Inject 12 Units into the skin as directed. Sliding  scale. 12 units if blood sugar is 150, 16 units if sugar is 200 and above      levothyroxine 75 MCG tablet   Commonly known as: SYNTHROID, LEVOTHROID   Take 75 mcg by mouth daily.      methocarbamol 500 MG tablet   Commonly known as: ROBAXIN   Take 500 mg by mouth 4 (four) times daily.      metoprolol-hydrochlorothiazide 100-25 MG per tablet   Commonly known as: LOPRESSOR HCT   Take 0.5 tablets by mouth daily.      omeprazole 20 MG capsule   Commonly known as: PRILOSEC   Take 20 mg by mouth daily.      oxybutynin 10 MG 24 hr tablet   Commonly known as: DITROPAN-XL   Take 10 mg by mouth daily.      oxycodone 5 MG capsule   Commonly known as: OXY-IR   Take 5 mg by mouth every 6 (six) hours as needed. For pain      Valproic Acid 250 MG/5ML Syrp syrup   Commonly known as: DEPAKENE   Take 20 mLs (1,000 mg total) by mouth 2 (two) times daily.           Activity: activity as tolerated Diet: regular diet Wound Care: as directed  FOLLOW-UP ITEMS: HTN: restarting HCTZ and metoprolol as BP increases and renal function improves Renal: Elevated Cr at time of DC above baseline but trending down Seizure: Pt now on Depakote. Recommend prolonged therapy and possible neuro f/u for recommendations L foot wound: will likely need future amputation as outlined above. F/u with vascular as needed.    Signed: Clytie Shetley, MD 02/23/2012  Large contributions of DC summary made by Dr. Marena Chancy

## 2012-02-20 LAB — GLUCOSE, CAPILLARY
Glucose-Capillary: 114 mg/dL — ABNORMAL HIGH (ref 70–99)
Glucose-Capillary: 103 mg/dL — ABNORMAL HIGH (ref 70–99)
Glucose-Capillary: 123 mg/dL — ABNORMAL HIGH (ref 70–99)

## 2012-02-20 LAB — COMPREHENSIVE METABOLIC PANEL
ALT: 13 U/L (ref 0–35)
AST: 14 U/L (ref 0–37)
Calcium: 9.4 mg/dL (ref 8.4–10.5)
Creatinine, Ser: 2.1 mg/dL — ABNORMAL HIGH (ref 0.50–1.10)
GFR calc Af Amer: 27 mL/min — ABNORMAL LOW (ref >90)
Glucose, Bld: 115 mg/dL — ABNORMAL HIGH (ref 70–99)
Sodium: 144 mEq/L (ref 135–145)
Total Protein: 6.6 g/dL (ref 6.0–8.3)

## 2012-02-20 MED ORDER — SODIUM CHLORIDE 0.45 % IV SOLN: INTRAVENOUS | Status: DC

## 2012-02-20 MED ORDER — VALPROIC ACID 250 MG/5ML PO SYRP
1000.0000 mg | ORAL_SOLUTION | Freq: Two times a day (BID) | ORAL | Status: DC
  Administered 2012-02-21 – 2012-02-23 (×6): 1000 mg via ORAL
  Filled 2012-02-20 (×7): qty 20

## 2012-02-20 MED ORDER — SODIUM CHLORIDE 0.9 % IV SOLN: INTRAVENOUS | Status: DC

## 2012-02-20 NOTE — Progress Notes (Signed)
Seen and examined.  Discussed with Dr. Louanne Belton.  Because iv access is a problem, will try to encourage PO intake.  See my separate note about concern for sleepiness.

## 2012-02-20 NOTE — Progress Notes (Signed)
Family Medicine Teaching Service [facility] Progress Note  Patient name: Alexandra Wiley Medical record number: [id number] Date of birth: [birth year] Age: 66 y.o. Gender: female    LOS: 18 days   Primary Care Provider: Laurena Slimmer, MD, MD  Overnight Events:  No acute events.  Goals of care not clearly established due to family disagreement.  Objective: Vital signs in last 24 hours: Temp:  [98 F (36.7 C)-98.9 F (37.2 C)] 98.3 F (36.8 C) (06/01 0548) Pulse Rate:  [83-96] 83  (06/01 0548) Resp:  [15-18] 18  (06/01 0548) BP: (130-158)/(54-83) 131/60 mmHg (06/01 0548) SpO2:  [98 %-100 %] 100 % (06/01 0548) Weight:  [169 lb 15.6 oz (77.1 kg)] 169 lb 15.6 oz (77.1 kg) (06/01 0700)  PE: Filed Vitals:   02/20/12 0100 02/20/12 0200 02/20/12 0548 02/20/12 0700  BP: 154/77 142/74 131/60   Pulse: 96  83   Temp: 98.5 F (36.9 C)  98.3 F (36.8 C)   TempSrc: Oral  Oral   Resp: 16  18   Height:      Weight:    169 lb 15.6 oz (77.1 kg)  SpO2: 100%  100%    Gen: Awake this AM. Follows commands but does not talk HEENT: dry mucous membranes CV: S1S2, RRR, 3/6 systolic murmur unchanged from previous Res: cta b/l anteriorly Abd: soft, non distended, present bowel sounds Ext/Musc: left heel elevated with boot in place. Only trace edema appreciated in left leg. Right stump wrapped. No edema noted through bandage. Clean and dry Labs/Studies:  Basic Metabolic Panel:    Component Value Date/Time   NA 144 02/20/2012 0630   K 4.2 02/20/2012 0630   CL 107 02/20/2012 0630   CO2 24 02/20/2012 0630   BUN 29* 02/20/2012 0630   CREATININE 2.10* 02/20/2012 0630   GLUCOSE 115* 02/20/2012 0630   CALCIUM 9.4 02/20/2012 0630   CBC:    Component Value Date/Time   WBC 15.5* 02/19/2012 0530   HGB 9.3* 02/19/2012 0530   HCT 29.0* 02/19/2012 0530   PLT 415* 02/19/2012 0530   MCV 83.1 02/19/2012 0530   NEUTROABS 14.3* 02/11/2012 0415   LYMPHSABS 3.6 02/11/2012 0415   MONOABS 3.2* 02/11/2012 0415   EOSABS 0.0  02/11/2012 0415   BASOSABS 0.0 02/11/2012 0415   Assessment/Plan: 66 yo female with grade 2 diastolic CHF (EF: 60-65%) , insulin dependent diabetes, 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 Was transferred to [facility] service due to concern for sepsis. 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 Was re-intubated on 5/18 because of unresponsiveness, decreased sensorium and extubated on 02/13/12.   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 MRI on 5/20 did not show any findings of osteomyelitis post right above knee. Questionable myofasciitis in both thighs.  - blood cultures neg x5 days.  - sputum cultures negative - vanc and meropenem stopped on 02/15/12 (vanc: 5/14-5/27; meropenem 5/20-5/27; was on zosyn from 5/14-5/20, as well as imipenem from 5/20 to 5/23). No evidence of infection at this time.  - WBC today stable at 15.5.   # acute encephalopathy:  Patient intubated 5/18; likely with metabolic encephalopathy. Was extubated on 05/25. Negative MRI, CT. Initial EEG showing moderate encephalopathy of nonspecific etiology.  Latest EEG showed improvement compared to previous. - patient remains drowsy and sleepy today. With patient's creatinine clearance, keppra should be clearing this morning.  - gapapentin was stopped yesterday -  patient on depakote po 1000mg  bid, level mildly low - If patient not clearing by 6/3, will plan to reconsult neuro for additional help in management  # Peripheral vascular disease:  -left leg arteriogram showed overall patent arteries except for moderate diffuse disease of superficial femoral artery beginning at the origin of the left and extending to the adductor canal.  - will follow up with vascular surgery as to recommendations - also having meeting this morning with family to discuss goals of care regarding further procedures involving the  left leg.  # Acute renal insufficiency: likely from hypovolemia and ATN. Cr remains in the 1.9-2.1 range. - Restart 1/2 MIVF due to poor PO intake  # Acute Respiratory failure 2/2 acute encephalopathy:  - stable on room air.   # Diabetes:   Basename 02/20/12 0732 02/19/12 2210 02/19/12 1640  GLUCAP 114* 175* 99  - novolog sliding scale coverage  - on Lantus 15 units  # h/o spinal cord infarct:  - baclofen and gabapentin discontinued for possible cause of AMS - MRI from 05/24 did not show any evidence of spinal cord infarct. 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   # diastolic CHF grade 2 diastolic dysfunction with EF of 60-65% on May 2012 echo   -  Currently on amlodipine, aspirin, atorvastatin - metoprolol stopped   # h/o gout: hold allopurinol. No ss of flare.  # h/o HTN: has fluctuating BP depending on which arm is used for measurement. BP's are a little more elevated compared to previous.   - continue amlodipine  - continue to monitor  #h/o hypothyroid:  - continue home levothyroxine   PPx:  - heparin sub q  - PPI   Fen/GI:  Carb modified diet. KVO  Dispo: family and patient do not want SNF at this point. Her husband is retired and can take care of her. Her daughter is available as well. They are comfortable bringing her home with home health PT/OT.  May consider full palliative consult early in the week to help with goals of care and family decision making.  - Pt is FULL CODE  LOS 18  SignedMajel Homer, MD [phone] 02/20/2012 8:31 AM

## 2012-02-20 NOTE — Progress Notes (Signed)
  Subjective:    Patient ID: Alexandra Wiley, female    DOB: [birth year], 66 y.o.   MRN: [id number]  HPI Comments: Still very sleepy.  We have held all sedating meds for 48 hours.  We will continue to hold through the WE.  If she is still sleepy on Mon 6/3, I would ask neuro to reevaluate.  It is beginning to look like this is a chronic, recurrent problem for her rather than an acute, possibly medication induced problem.  Clinically, she looks like narcolepsy, cataplexy.  I am tempted to try low dose methylphenidate on her.  My major hesitation is her known vascular disease.  I will wait for neuro opinion before starting this med.     Review of Systems     Objective:   Physical Exam        Assessment & Plan:

## 2012-02-21 LAB — BASIC METABOLIC PANEL
CO2: 22 mEq/L (ref 19–32)
Calcium: 9.7 mg/dL (ref 8.4–10.5)
Creatinine, Ser: 2.28 mg/dL — ABNORMAL HIGH (ref 0.50–1.10)
GFR calc Af Amer: 24 mL/min — ABNORMAL LOW (ref >90)
GFR calc non Af Amer: 21 mL/min — ABNORMAL LOW (ref >90)
Sodium: 146 mEq/L — ABNORMAL HIGH (ref 135–145)

## 2012-02-21 LAB — CBC
MCH: 27.6 pg (ref 26.0–34.0)
Platelets: 449 10*3/uL — ABNORMAL HIGH (ref 150–400)
RBC: 3.52 MIL/uL — ABNORMAL LOW (ref 3.87–5.11)
RDW: 17.1 % — ABNORMAL HIGH (ref 11.5–15.5)

## 2012-02-21 LAB — GLUCOSE, CAPILLARY: Glucose-Capillary: 108 mg/dL — ABNORMAL HIGH (ref 70–99)

## 2012-02-21 MED ORDER — ENOXAPARIN SODIUM 30 MG/0.3ML ~~LOC~~ SOLN
30.0000 mg | SUBCUTANEOUS | Status: DC
  Administered 2012-02-21 – 2012-02-22 (×2): 30 mg via SUBCUTANEOUS
  Filled 2012-02-21 (×4): qty 0.3

## 2012-02-21 MED ORDER — SODIUM CHLORIDE 0.45 % IV SOLN
INTRAVENOUS | Status: DC
  Administered 2012-02-21 – 2012-02-23 (×5): via INTRAVENOUS

## 2012-02-21 NOTE — Progress Notes (Signed)
Family Medicine Teaching Service [facility] Progress Note  Patient name: Alexandra Wiley Medical record number: [id number] Date of birth: 05-11-1946 Age: 66 y.o. Gender: female    LOS: 19 days   Primary Care Provider: Laurena Slimmer, MD, MD  Overnight Events:  No acute events.  Is reporting some abdominal discomfort.  Objective: Vital signs in last 24 hours: Temp:  [98.2 F (36.8 C)-99.2 F (37.3 C)] 98.2 F (36.8 C) (06/02 0501) Pulse Rate:  [83-112] 112  (06/02 0501) Resp:  [18] 18  (06/02 0501) BP: (105-164)/(62-80) 105/70 mmHg (06/02 0501) SpO2:  [97 %-100 %] 100 % (06/02 0501) Weight:  [165 lb (74.844 kg)] 165 lb (74.844 kg) (06/02 0501)  PE: Filed Vitals:   02/20/12 1802 02/20/12 2231 02/21/12 0209 02/21/12 0501  BP: 154/80 125/64 108/62 105/70  Pulse: 102 112 105 112  Temp: 98.5 F (36.9 C) 99.1 F (37.3 C) 99.2 F (37.3 C) 98.2 F (36.8 C)  TempSrc: Oral Oral Oral   Resp: 18 18 18 18   Height:      Weight:    165 lb (74.844 kg)  SpO2: 100% 97% 98% 100%   Gen: More awake this morning than previously.  Follows commands and responds to simple questions. HEENT: dry mucous membranes CV: S1S2, RRR, 3/6 systolic murmur unchanged from previous Res: cta b/l anteriorly Abd: soft, mildly distended, present bowel sounds Ext/Musc: left heel elevated with boot in place. Only trace edema appreciated in left leg. Right stump wrapped. No edema noted through bandage. Clean and dry Labs/Studies:  Basic Metabolic Panel:    Component Value Date/Time   NA 146* 02/21/2012 0610   K 4.2 02/21/2012 0610   CL 110 02/21/2012 0610   CO2 22 02/21/2012 0610   BUN 29* 02/21/2012 0610   CREATININE 2.28* 02/21/2012 0610   GLUCOSE 84 02/21/2012 0610   CALCIUM 9.7 02/21/2012 0610   CBC:    Component Value Date/Time   WBC 15.3* 02/21/2012 0610   HGB 9.7* 02/21/2012 0610   HCT 28.9* 02/21/2012 0610   PLT 449* 02/21/2012 0610   MCV 82.1 02/21/2012 0610   NEUTROABS 14.3* 02/11/2012 0415   LYMPHSABS 3.6  02/11/2012 0415   MONOABS 3.2* 02/11/2012 0415   EOSABS 0.0 02/11/2012 0415   BASOSABS 0.0 02/11/2012 0415   Assessment/Plan: 66 yo female with grade 2 diastolic CHF (EF: 60-65%) , insulin dependent diabetes, 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 Was transferred to [facility] service due to concern for sepsis. 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 Was re-intubated on 5/18 because of unresponsiveness, decreased sensorium and extubated on 02/13/12.   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 MRI on 5/20 did not show any findings of osteomyelitis post right above knee. Questionable myofasciitis in both thighs.  - blood cultures neg x5 days.  - sputum cultures negative - vanc and meropenem stopped on 02/15/12 (vanc: 5/14-5/27; meropenem 5/20-5/27; was on zosyn from 5/14-5/20, as well as imipenem from 5/20 to 5/23). No evidence of infection at this time.  - WBC stable at 15.5 - Problem is mainly resolved however continued osteo makes recurrence a decided concern.   # acute encephalopathy:  Patient intubated 5/18; likely with metabolic encephalopathy. Was extubated on 05/25. Negative MRI, CT. Initial EEG showing moderate encephalopathy of nonspecific etiology.  Latest EEG showed improvement compared to previous. - Patient appears to be making progress toward clearing sensorium.  I  do not have a great feel for the patient's baseline but hopefully family can give [place] a better feeling for how we are progressing - patient on depakote po 1000mg  bid, level mildly low - May consider repeat neuro consult 6/3 to help with continued management.  Patient has a somewhat long history of episodes of decreased responsiveness leading [place] to wonder about the possibility of narcolepsy.  # Peripheral vascular disease:  -left leg arteriogram showed overall patent arteries except for moderate  diffuse disease of superficial femoral artery beginning at the origin of the left and extending to the adductor canal.  - will follow up with vascular surgery as to recommendations - also having meeting this morning with family to discuss goals of care regarding further procedures involving the left leg.  # Acute renal insufficiency: likely from hypovolemia and ATN. Cr is gradually rising. 2.28 today. - Patient does not have a working IV at this time. - May need to restart IV in order to better hydrate as I feel she is likely not taking in enough fluid.  # Acute Respiratory failure 2/2 acute encephalopathy:  - stable on room air.   # Diabetes:   Basename 02/20/12 2229 02/20/12 1703 02/20/12 1229  GLUCAP 86 123* 103*  - novolog sliding scale coverage  - on Lantus 15 units  # h/o spinal cord infarct:  - baclofen and gabapentin discontinued for possible cause of AMS - MRI from 05/24 did not show any evidence of spinal cord infarct. 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   # diastolic CHF grade 2 diastolic dysfunction with EF of 60-65% on May 2012 echo   -  Currently on amlodipine, aspirin, atorvastatin - metoprolol stopped   # h/o gout: hold allopurinol. No ss of flare.  # h/o HTN: has fluctuating BP depending on which arm is used for measurement. BP's are a little more elevated compared to previous.   - continue amlodipine  - continue to monitor  #h/o hypothyroid:  - continue home levothyroxine   PPx:  - heparin sub q  - PPI   Fen/GI:  Carb modified diet. KVO  Dispo: family and patient do not want SNF at this point. Her husband is retired and can take care of her. Her daughter is available as well. They are comfortable bringing her home with home health PT/OT.  May consider full palliative consult early in the week to help with goals of care and family decision making.  - Pt is FULL CODE  LOS  19  SignedMajel Homer, MD [phone] 02/21/2012 8:16 AM

## 2012-02-21 NOTE — Progress Notes (Signed)
Seen and examined.  Much more alert today - she was awake during my full examine for the first time in days.  She is now alert enough to tell that she has delirium.  Likely multifactorial toxic metabolic delirium.  We continue to hold all sedating meds.  I am now less inclined to the diagnosis of narcolepsy/cataplexy.  I suspect we are dealing with unrecognized early dementia and that she has delirium (i.e. Brain failure) due to the stress of illness, surg and her poor brain reserve.  I think patience is the best approach at this point.    With her delirium and several days of decreased LOC, we are behind on fluids - agree with restart iv.   Finally, the delirium is one more example of her big picture downhill course.  Her left leg is tenuous and may need bypass or amputation in the future.  I suspect her ability to participate in rehab is poor.  We tried to discuss goals of care last week with family and met resistance. I would ask paliative care to see this week to try to continue that discussion.  I would like clear goals of care before the next crisis hits.

## 2012-02-22 DIAGNOSIS — R4182 Altered mental status, unspecified: Secondary | ICD-10-CM

## 2012-02-22 DIAGNOSIS — R627 Adult failure to thrive: Secondary | ICD-10-CM

## 2012-02-22 DIAGNOSIS — F29 Unspecified psychosis not due to a substance or known physiological condition: Secondary | ICD-10-CM

## 2012-02-22 LAB — CBC
HCT: 25.6 % — ABNORMAL LOW (ref 36.0–46.0)
Hemoglobin: 8.3 g/dL — ABNORMAL LOW (ref 12.0–15.0)
MCH: 26.8 pg (ref 26.0–34.0)
MCV: 82.6 fL (ref 78.0–100.0)
RBC: 3.1 MIL/uL — ABNORMAL LOW (ref 3.87–5.11)
WBC: 14.7 10*3/uL — ABNORMAL HIGH (ref 4.0–10.5)

## 2012-02-22 LAB — BASIC METABOLIC PANEL
BUN: 31 mg/dL — ABNORMAL HIGH (ref 6–23)
CO2: 21 mEq/L (ref 19–32)
Calcium: 8.7 mg/dL (ref 8.4–10.5)
Chloride: 108 mEq/L (ref 96–112)
Creatinine, Ser: 2.24 mg/dL — ABNORMAL HIGH (ref 0.50–1.10)
Glucose, Bld: 80 mg/dL (ref 70–99)

## 2012-02-22 LAB — GLUCOSE, CAPILLARY
Glucose-Capillary: 81 mg/dL (ref 70–99)
Glucose-Capillary: 70 mg/dL (ref 70–99)

## 2012-02-22 MED ORDER — ENSURE COMPLETE PO LIQD
237.0000 mL | Freq: Three times a day (TID) | ORAL | Status: DC
  Administered 2012-02-23 (×2): 237 mL via ORAL

## 2012-02-22 NOTE — Progress Notes (Signed)
Family Medicine Teaching Service [facility] Progress Note  Patient name: Alexandra Wiley Medical record number: [id number] Date of birth: 1945-11-03 Age: 66 y.o. Gender: female    LOS: 20 days   Primary Care Provider: Laurena Slimmer, MD, MD  Overnight Events:  NAD, no complaints today. Wants to get OOB to chair. Able to hold conversation today  Objective: Vital signs in last 24 hours: Temp:  [98.2 F (36.8 C)-99.2 F (37.3 C)] 98.2 F (36.8 C) (06/03 0558) Pulse Rate:  [81-103] 81  (06/03 0558) Resp:  [16-18] 16  (06/03 0558) BP: (107-142)/(53-72) 113/53 mmHg (06/03 0558) SpO2:  [94 %-100 %] 94 % (06/03 0558) Weight:  [164 lb 8 oz (74.617 kg)] 164 lb 8 oz (74.617 kg) (06/03 0558)  Wt Readings from Last 3 Encounters:  02/22/12 164 lb 8 oz (74.617 kg)  02/22/12 164 lb 8 oz (74.617 kg)  02/22/12 164 lb 8 oz (74.617 kg)     Current Facility-Administered Medications  Medication Dose Route Frequency Provider Last Rate Last Dose  . 0.45 % sodium chloride infusion   Intravenous Continuous Phebe Colla, MD 100 mL/hr at 02/22/12 0533    . acetaminophen (TYLENOL) tablet 650 mg  650 mg Oral Q4H PRN Chuck Hint, MD      . albuterol (PROVENTIL HFA;VENTOLIN HFA) 108 (90 BASE) MCG/ACT inhaler 2 puff  2 puff Inhalation Q4H PRN Lonia Skinner, MD      . amLODipine (NORVASC) tablet 5 mg  5 mg Oral Daily Simonne Martinet, NP   5 mg at 02/21/12 0958  . antiseptic oral rinse (BIOTENE) solution 15 mL  15 mL Mouth Rinse QID Alyson Reedy, MD   15 mL at 02/21/12 0018  . aspirin chewable tablet 81 mg  81 mg Oral Daily Coralyn Helling, MD   81 mg at 02/21/12 0958  . atorvastatin (LIPITOR) tablet 10 mg  10 mg Oral Daily Lonia Skinner, MD   10 mg at 02/21/12 0958  . enoxaparin (LOVENOX) injection 30 mg  30 mg Subcutaneous Q24H Carney Living, MD   30 mg at 02/21/12 1222  . feeding supplement (GLUCERNA SHAKE) liquid 237 mL  237 mL Oral BID BM Ailene Ards, RD   237 mL at 02/21/12  1406  . insulin aspart (novoLOG) injection 0-15 Units  0-15 Units Subcutaneous TID WC Carney Living, MD   2 Units at 02/20/12 1730  . insulin glargine (LANTUS) injection 15 Units  15 Units Subcutaneous Daily Coralyn Helling, MD   15 Units at 02/21/12 0959  . levothyroxine (SYNTHROID, LEVOTHROID) tablet 75 mcg  75 mcg Oral QAC breakfast Coralyn Helling, MD   75 mcg at 02/22/12 0731  . pantoprazole (PROTONIX) EC tablet 40 mg  40 mg Oral QHS Carney Living, MD   40 mg at 02/21/12 2223  . sodium phosphate (FLEET) 7-19 GM/118ML enema 1 enema  1 enema Rectal Daily PRN Lonia Farber, MD   1 enema at 02/14/12 2042  . Valproic Acid (DEPAKENE) 250 MG/5ML syrup SYRP 1,000 mg  1,000 mg Oral BID Carney Living, MD   1,000 mg at 02/21/12 2212  . DISCONTD: enoxaparin (LOVENOX) injection 40 mg  40 mg Subcutaneous Q24H Chuck Hint, MD   40 mg at 02/20/12 0813     PE: Gen: More awake this morning than previously. Follows commands and responds to simple questions, though not oriented to time or place.  HEENT: MMM  CV: RRR, 3/6 systolic murmur unchanged  from previous  Res: cta bilat  Abd: soft, mildly distended  Ext/Musc: left heel elevated with boot in place. Only trace edema appreciated in left leg. Right stump wrapped. No edema noted through bandage. Clean and dry   Labs/Studies:  Basic Metabolic Panel:    Component Value Date/Time   NA 141 02/22/2012 0456   K 4.1 02/22/2012 0456   CL 108 02/22/2012 0456   CO2 21 02/22/2012 0456   BUN 31* 02/22/2012 0456   CREATININE 2.24* 02/22/2012 0456   GLUCOSE 80 02/22/2012 0456   CALCIUM 8.7 02/22/2012 0456   Lab Results  Component Value Date   WBC 14.7* 02/22/2012   HGB 8.3* 02/22/2012   HCT 25.6* 02/22/2012   MCV 82.6 02/22/2012   PLT 413* 02/22/2012   CBG (last 3)   Basename 02/21/12 2202 02/21/12 1652 02/21/12 1145  GLUCAP 84 108* 118*       Assessment/Plan: 66 yo female with grade 2 diastolic CHF (EF: 60-65%) , insulin dependent  diabetes, 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 Was transferred to [facility] service due to concern for sepsis. 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 Was re-intubated on 5/18 because of unresponsiveness, decreased sensorium and extubated on 02/13/12.   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 MRI on 5/20 did not show any findings of osteomyelitis post right above knee. Questionable myofasciitis in both thighs.  - blood cultures neg >5 days (5/19) .  - sputum cultures negative >5 days (5/19) - vanc and meropenem stopped on 02/15/12 (vanc: 5/14-5/27; meropenem 5/20-5/27; was on zosyn from 5/14-5/20, as well as imipenem from 5/20 to 5/23). No evidence of infection at this time.  - WBC stable at 4.7 today - Problem is mainly resolved however continued osteo makes recurrence a decided concern.  # acute encephalopathy: Patient intubated 5/18; likely with metabolic encephalopathy. Was extubated on 05/25. Negative MRI, CT. Initial EEG showing moderate encephalopathy of nonspecific etiology. Latest EEG showed improvement compared to previous. Pt much improved today and will discuss mental status w/ family as they arrive today.  - Continue depakote po 1000mg  bid   # Peripheral vascular disease: left leg arteriogram showed overall patent arteries except for moderate diffuse disease of superficial femoral artery beginning at the origin of the left and extending to the adductor canal. Vascular recommends surgery if heal wound progresses.  -Will meet w/ family to discuss goals of care regarding further procedures involving the left leg.   # Acute renal insufficiency: likely from hypovolemia and ATN. Resolveing Cr 2.24 today. Baseline Cr of 2.0 - Patient does not have a working IV at this time.  - May need to restart IV in order to better hydrate as I feel she is likely not taking  in enough fluid.   # Acute Respiratory failure 2/2 acute encephalopathy:  - stable on room air.   # Diabetes: Well controlled - novolog sliding scale coverage  - on Lantus 15 units   # h/o spinal cord infarct: Baclofen and gabapentin discontinued for possible cause of AMS. MRI from 05/24 did not show any evidence of spinal cord infarct. 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   # diastolic CHF: Grade 2 diastolic dysfunction with EF of 60-65% on May 2012 echo  - Currently on amlodipine, aspirin, atorvastatin  - metoprolol stopped   # h/o gout: hold allopurinol. No  ss of flare.   # h/o HTN: has fluctuating BP depending on which arm is used for measurement. .  - continue amlodipine  - continue to monitor   #h/o hypothyroid:  - continue home levothyroxine   PPx:  - heparin sub q  - PPI   Fen/GI: Carb modified diet. KVO   Dispo: Pending improvement and likely palliative care meeting.   - Pt is FULL CODE   LOS 20  Signed: Tylyn Derwin, MD Family Medicine Resident PGY-1 [phone] 02/22/2012 9:23 AM

## 2012-02-22 NOTE — Progress Notes (Signed)
Clinical Child psychotherapist (CSW) never received consent to fax pt out to skilled nursing facilities. Pt daughter and spouse had last informed CSW that they wanted to take pt home as they are able to provide pt with 24hr care. CSW observed PT now recommending [facility] [address] PT/24hr care. CSW will sign off. Please re consult if there are any changes.  Theresia Bough, MSW, Theresia Majors [phone]

## 2012-02-22 NOTE — Progress Notes (Signed)
Occupational Therapy Treatment Patient Details Name: Alexandra Wiley MRN: [id number] DOB: 05-13-1946 Today's Date: 02/22/2012 Time: 1800-1850 OT Time Calculation (min): 50 min  OT Assessment / Plan / Recommendation Comments on Treatment Session Pt not oriented to place. Husband participated in session and states that confusion is better. He also states that PTA, pt was mod I with mobility and ADL. Pt agreeable to transfer into recliner and show husband how she transfers. PT apparently remembered technique from earlier session with mod cues for hand placement. Demonstrated poor judgement by "falling back into chair", instead of using a controlled scoot. Feel that pt would benefit from considering inpatient rehab to increase strength and endurance and for family education in transfers. Pt was mod I PTA.    Follow Up Recommendations  Inpatient Rehab    Barriers to Discharge       Equipment Recommendations  None recommended by OT    Recommendations for Other Services Rehab consult  Frequency Min 2X/week   Plan Discharge plan needs to be updated    Precautions / Restrictions Precautions Precautions: Fall Precaution Comments: delirium Restrictions Weight Bearing Restrictions: Yes RLE Weight Bearing: Non weight bearing   Pertinent Vitals/Pain No c/o    ADL  ADL Comments: Focus on bed to recliner transfer. Discussed home set up and possible D/C home. Pt's husband states he is nervous about taking pt home. Discussed SNF and pt stated that was not an option. Discussed possibility of short CIR stay to increase indep with transfers and increase indep with self care. Pt's husband interested in rehab.     OT Diagnosis:    OT Problem List:   OT Treatment Interventions:     OT Goals Acute Rehab OT Goals OT Goal Formulation: With patient Time For Goal Achievement: 02/22/12 Potential to Achieve Goals: Good ADL Goals Pt Will Perform Grooming: with set-up;Sitting, edge of bed;Supported ADL  Goal: Grooming - Progress: Met Pt Will Perform Upper Body Bathing: with set-up;Sitting, edge of bed;Supported ADL Goal: Upper Body Bathing - Progress: Progressing toward goals Pt Will Perform Upper Body Dressing: with set-up;Sitting, bed;Supported ADL Goal: Upper Body Dressing - Progress: Progressing toward goals Pt Will Perform Lower Body Dressing: with mod assist;Supine, head of bed up;Supine, rolling right and/or left;with cueing (comment type and amount);with caregiver independent in assisting ADL Goal: Lower Body Dressing - Progress: Goal set today Pt Will Transfer to Toilet: with 2+ total assist;Anterior-posterior transfer;Drop arm 3-in-1 ADL Goal: Toilet Transfer - Progress: Discontinued (comment) (pt does not transfer to toilet PTA) Additional ADL Goal #1: Family will demonstrate an/post transer with pt with S. ADL Goal: Additional Goal #1 - Progress: Goal set today Miscellaneous OT Goals Miscellaneous OT Goal #1: Pt will complete supine <> sit EOB total +2 pt50% as precursor to adls at EOB OT Goal: Miscellaneous Goal #1 - Progress: Met Miscellaneous OT Goal #2: Pt/family to complete BUE strengtheing HEP with theraband/putty to increase strength and endurance for ADL.  Visit Information  Last OT Received On: 02/22/12    Subjective Data      Prior Functioning  Home Living Lives With: Spouse;Family Available Help at Discharge: Family Type of Home: House Home Access: Stairs to enter Secretary/administrator of Steps: 1 Home Layout: Multi-level Bathroom Shower/Tub: Forensic scientist: Standard Bathroom Accessibility: Yes How Accessible: Accessible via wheelchair Home Adaptive Equipment: Wheelchair - manual;Wheelchair - powered Additional Comments: pt can live on main floor Prior Function Level of Independence: Independent with assistive device(s) Needs Assistance: Light Housekeeping;Meal Prep Meal  Prep: Maximal Able to Take Stairs?: No Driving:  No Communication Communication: No difficulties Dominant Hand: Right    Cognition  Overall Cognitive Status: Impaired Arousal/Alertness: Lethargic Orientation Level: Person Behavior During Session: Lethargic Current Attention Level: Sustained Awareness of Errors: Assistance required to identify errors made;Assistance required to correct errors made    Mobility Bed Mobility Bed Mobility: Supine to Sit;Scooting to HOB Supine to Sit: 4: Min assist;HOB elevated;With rails Supine to Sit: Patient Percentage: 70% Sitting - Scoot to Edge of Bed: 4: Min assist;With rail Sitting - Scoot to [place] of Bed: Patient Percentage: 90% Details for Bed Mobility Assistance:  (long sitting, backing into recliner)   Exercises    Balance Static Sitting Balance Static Sitting - Balance Support: Bilateral upper extremity supported Static Sitting - Level of Assistance: 5: Stand by assistance  End of Session OT - End of Session Activity Tolerance: Patient limited by fatigue Patient left: in chair;with call bell/phone within reach;with family/visitor present Nurse Communication: Mobility status   Reinhart Saulters,HILLARY 02/22/2012, 7:09 PM

## 2012-02-22 NOTE — Progress Notes (Signed)
Palliative Medicine Team consult for goals of care [facility] with patient at bedside, she was alert but unable to answer many questions posed to her she stated she had a daughter but could not tell me anyone else who lived with her; and also noted patient had a hard time staying awake during the conversation; though would easily wake and smile saying she was 'ok';  attempted to contact patient's husband Genevie Cheshire [phone]) no answer and no answering machine- spoke with patient's son Delila Pereyra he was able to contact his father and meeting confirmed for today, Monday 6/3 @ 4:00 pm  Valente David, RN 02/22/2012, 12:39 PM Palliative Medicine Team RN Liaison [phone]

## 2012-02-22 NOTE — Progress Notes (Signed)
Nutrition Follow-up  Diet Order:  CHO Modified Medium  Consult received for pt with poor PO intake.  Pt has been followed by RD through ICU/ventilation process where she was provided with nutrition support and was briefly educated on DM and blood sugar management after extubation.  Since extubation and diet advancement, pt has not been able to improve intake.  PO intake at meals is 0-10% with Glucerna BID.  Pt is sipping on Glucerna at time of visit.  She seems distracted/difficult to engage in conversation and nods off a few times.  Pt is not able to clearly describe why she is not eating at this time.  Does nod her head 'yes' when asked if she had a poor appetite.    Meds: Scheduled Meds:   . amLODipine  5 mg Oral Daily  . antiseptic oral rinse  15 mL Mouth Rinse QID  . aspirin  81 mg Oral Daily  . atorvastatin  10 mg Oral Daily  . enoxaparin (LOVENOX) injection  30 mg Subcutaneous Q24H  . feeding supplement  237 mL Oral BID BM  . insulin aspart  0-15 Units Subcutaneous TID WC  . insulin glargine  15 Units Subcutaneous Daily  . levothyroxine  75 mcg Oral QAC breakfast  . pantoprazole  40 mg Oral QHS  . Valproic Acid  1,000 mg Oral BID   Continuous Infusions:   . sodium chloride 100 mL/hr at 02/22/12 0533   PRN Meds:.acetaminophen, albuterol, sodium phosphate  Labs:  CMP     Component Value Date/Time   NA 141 02/22/2012 0456   K 4.1 02/22/2012 0456   CL 108 02/22/2012 0456   CO2 21 02/22/2012 0456   GLUCOSE 80 02/22/2012 0456   BUN 31* 02/22/2012 0456   CREATININE 2.24* 02/22/2012 0456   CALCIUM 8.7 02/22/2012 0456   PROT 6.6 02/20/2012 0630   ALBUMIN 3.1* 02/20/2012 0630   AST 14 02/20/2012 0630   ALT 13 02/20/2012 0630   ALKPHOS 80 02/20/2012 0630   BILITOT 0.3 02/20/2012 0630   GFRNONAA 21* 02/22/2012 0456   GFRAA 25* 02/22/2012 0456     Intake/Output Summary (Last 24 hours) at 02/22/12 1252 Last data filed at 02/22/12 0930  Gross per 24 hour  Intake 556.67 ml  Output   1350 ml  Net  -793.33 ml    Weight Status:  Decreasing, several liters of fluid removed, s/p AKA (5/17) Admission wt: 179 lbs Current wt: 164 lbs  Re-estimated needs:  1700-1900 kcal, 90-100g protein  Nutrition Dx:  Inadequate oral intake, ongoing  Intervention:   1.  Supplements; Continue Glucerna BID, will add Ensure Complete TID as pt is only accepting supplements at this time- no solid foods.  Supplement regimen if consumed 100% will provide 1490 kcal, 58g protein 2.  Enteral nutrition; pt may benefit from short-term nutrition therapy to provide sufficient kcal and protein for healing and sustenance through decision making process.  RD to provide recommendations if appropriate. 2.  Nutrition-related medication; consider appetite stimulant if no improvement in PO intake and enteral nutrition not warranted.  Monitor:   1.  Food/Beverage; improvement in intake with improvement in appetite and ?MS. 2. Skin; s/s healing at AKA 3.  GOC; related to d/c plans and nutrition interventions   Hoyt Koch Pager #:  [phone]

## 2012-02-22 NOTE — Progress Notes (Signed)
Physical Therapy Treatment Patient Details Name: Alexandra Wiley MRN: [id number] DOB: [birth year] Today's Date: 02/22/2012 Time: 4098-1191 PT Time Calculation (min): 9 min  PT Assessment / Plan / Recommendation Comments on Treatment Session  Nursing confused about how to transfer patient back to bed so asked for my assistance. Educated nursing and reinforced technique with patient so that she would be better able to dictate to them her transfer technique for next time. Pt did well but needing a little more facilitation get out of a lower surface (chair->bed). Once back in bed patient wtih good technique scooting.     Follow Up Recommendations  Home health PT;Supervision/Assistance - 24 hour    Barriers to Discharge        Equipment Recommendations  Wheelchair (measurements);Wheelchair cushion (measurements) (not sure if pt has w/c but will need one for d/c home)    Recommendations for Other Services    Frequency Min 3X/week   Plan Discharge plan remains appropriate;Frequency remains appropriate    Precautions / Restrictions Restrictions RLE Weight Bearing: Non weight bearing Other Position/Activity Restrictions: Right AKA    Mobility  Bed Mobility Supine to Sit: Not tested (comment) Sitting - Scoot to Edge of Bed: Not tested (comment) Sit to Supine: 6: Modified independent (Device/Increase time);HOB elevated (45 degrees and propped with pillows) Details for Bed Mobility Assistance: pt able to maintain long sitting when HOB lowered, use of pad to assist pt with scooting hips around to the edge of the bed in prep for transfer Transfers Anterior-Posterior Transfer: 1: +2 Total assist Anterior-Posterior Transfers: Patient Percentage: 50% Details for Transfer Assistance: cues for sequencing as pt trying to laterally scoot over the arm rest as I walked into her room; pt repositioned in the chair and educated nursing staff on positioning chair perpendicularly to the bed for PA transfer  back to bed; pt then able to initiate transfer independently with use of arms but needed +2totalpt50% to scoot hips anteriorly back into the bed over the lip of the chair; pt then independently (in long sitting) able to scoot hips and buttocks around for better positioning in the bed    Exercises      PT Goals Acute Rehab PT Goals Pt will go Supine/Side to Sit: Independently PT Goal: Supine/Side to Sit - Progress: Updated due to goal met Pt will Sit at [facility] of Bed: Independently PT Goal: Sit at [facility] Of Bed - Progress: Updated due to goal met Pt will Transfer Bed to Chair/Chair to Bed: with supervision (AP transfers) PT Transfer Goal: Bed to Chair/Chair to Bed - Progress: Progressing toward goal  Visit Information  Last PT Received On: 02/22/12 Assistance Needed: +2    Subjective Data  Subjective: Pt wanting to get back to bed per nursing.    Cognition  Overall Cognitive Status: Impaired Cognition - Other Comments: slow processing    Balance  Static Sitting Balance Static Sitting - Balance Support: Bilateral upper extremity supported Static Sitting - Level of Assistance: 5: Stand by assistance Static Sitting - Comment/# of Minutes: pt sat in long sitting x1 minute at least in prep to scoot posterior in chair  End of Session PT - End of Session Equipment Utilized During Treatment: Gait belt Activity Tolerance: Patient tolerated treatment well Patient left: in bed;with call bell/phone within reach Nurse Communication: Mobility status    WHITLOW,Malayshia All HELEN 02/22/2012, 11:56 AM

## 2012-02-22 NOTE — Progress Notes (Signed)
Subjective: Interval History: none..   Objective: Vital signs in last 24 hours: Temp:  [98.2 F (36.8 C)-99.2 F (37.3 C)] 98.2 F (36.8 C) (06/03 0558) Pulse Rate:  [81-103] 81  (06/03 0558) Resp:  [16-18] 16  (06/03 0558) BP: (107-142)/(53-72) 113/53 mmHg (06/03 0558) SpO2:  [94 %-100 %] 94 % (06/03 0558) Weight:  [164 lb 8 oz (74.617 kg)] 164 lb 8 oz (74.617 kg) (06/03 0558)  Intake/Output from previous day: 06/02 0701 - 06/03 0700 In: 626.7 [P.O.:120; I.V.:506.7] Out: 950 [Urine:950] Intake/Output this shift:    Physical exam: Right above-knee amputation healing well with no evidence of drainage or skin breakdown Left heel with area approximately 3 cm diameter full-thickness skin loss with some drainage Neurologic confused does not understand where she is this morning  Lab Results:  Basename 02/22/12 0456 02/21/12 0610  WBC 14.7* 15.3*  HGB 8.3* 9.7*  HCT 25.6* 28.9*  PLT 413* 449*   BMET  Basename 02/22/12 0456 02/21/12 0610  NA 141 146*  K 4.1 4.2  CL 108 110  CO2 21 22  GLUCOSE 80 84  BUN 31* 29*  CREATININE 2.24* 2.28*  CALCIUM 8.7 9.7    Studies/Results: Ct Abdomen Pelvis Wo Contrast  02/08/2012  *RADIOLOGY REPORT*  Clinical Data:  Sepsis  CT CHEST, ABDOMEN AND PELVIS WITHOUT CONTRAST  Technique:  Multidetector CT imaging of the chest, abdomen and pelvis was performed following the standard protocol without IV contrast.  Comparison:  The 04/09/2004.  CT CHEST  Findings:  Endotracheal tube tip is seen in the mid trachea.  Right IJ central line tip projects at the mid SVC level.  Feeding tube tip is in the mid stomach.  No axillary lymphadenopathy.  Scattered small lymph nodes are seen in the mediastinum.  No overt hilar lymphadenopathy.  The heart is enlarged.  Coronary artery calcification is evident.  No pericardial effusion.  Probable tiny bilateral pleural effusions.  Evaluation of fine detail in the lungs is obscured by patient breathing motion.  There is  patchy airspace disease in the left upper lobe with bibasilar collapse / consolidation.  IMPRESSION: Patchy airspace disease in the left upper lobe suggest pneumonia. This is associated bibasilar collapse / consolidation.  Feeding tube tip is in the mid stomach.  CT ABDOMEN AND PELVIS  Findings:  Low attenuation in the liver parenchyma along the falciform ligament may be related to an area of fatty infiltration. No focal abnormality in the spleen on this study performed without intravenous contrast material.  The duodenum, pancreas, and adrenal glands are unremarkable.  No stones are seen in either kidney. Mild fullness of the right intrarenal collecting system is evident and there appears to be some subtle right perinephric and proximal periureteric edema.  No abdominal aortic aneurysm.  No evidence for free fluid or lymphadenopathy in the abdomen.  There is no bowel obstruction.  There is a small amount of free fluid in the pelvis.  The patient appears to have some extraperitoneal edema/inflammation in the right retroperitoneal tissues tracking down into the right pelvic sidewall.  Foley catheter decompresses the urinary bladder.  Uterus is unremarkable.  No definite adnexal mass.  No pelvic sidewall lymphadenopathy.  Scattered diverticuli are seen in the sigmoid colon without diverticulitis.  Terminal ileum is unremarkable.  The retrocecal appendix is normal.  Bone windows show a sclerotic lesion in the left lesser trochanter, incompletely visualized.  Defect in the left iliac crest suggests site of prior bone harvest. There is some body  wall edema in the region of the pelvis and gas in the anterior subcutaneous fat of the lower left abdominal wall is presumably from injection site.  IMPRESSION: No definite findings to account for the reported history of clinical sepsis.  There is some fullness of the right intrarenal collecting system with right periureteric edema/inflammation and apparent fluid or inflammation  in the right retroperitoneal tissues tracking down to the right pelvic sidewall.  No evidence for right psoas enlargement suggests the presence of an underlying psoas abscess although the evaluation is limited by the lack of intravenous contrast material. Recent right urinary stone passage or right pyelonephritis would be a consideration.  Sclerotic lesion in the left proximal femurs incompletely visualized.  This has relatively well defined margins were visualized and is probably benign.  Left hip films may prove helpful to further evaluate.  Original Report Authenticated By: ERIC A. MANSELL, M.D.   Ct Head Wo Contrast  02/06/2012  *RADIOLOGY REPORT*  Clinical Data: Not waking up after surgery.  Rule out stroke.  CT HEAD WITHOUT CONTRAST  Technique:  Contiguous axial images were obtained from the base of the skull through the vertex without contrast.  Comparison: CT 11/18/2008  Findings: Age appropriate atrophy.  No acute infarct, hemorrhage, or mass.  Calvarium is intact.  IMPRESSION: No acute abnormality.  Original Report Authenticated By: Camelia Phenes, M.D.   Ct Chest Wo Contrast  02/08/2012  *RADIOLOGY REPORT*  Clinical Data:  Sepsis  CT CHEST, ABDOMEN AND PELVIS WITHOUT CONTRAST  Technique:  Multidetector CT imaging of the chest, abdomen and pelvis was performed following the standard protocol without IV contrast.  Comparison:  The 04/09/2004.  CT CHEST  Findings:  Endotracheal tube tip is seen in the mid trachea.  Right IJ central line tip projects at the mid SVC level.  Feeding tube tip is in the mid stomach.  No axillary lymphadenopathy.  Scattered small lymph nodes are seen in the mediastinum.  No overt hilar lymphadenopathy.  The heart is enlarged.  Coronary artery calcification is evident.  No pericardial effusion.  Probable tiny bilateral pleural effusions.  Evaluation of fine detail in the lungs is obscured by patient breathing motion.  There is patchy airspace disease in the left upper lobe  with bibasilar collapse / consolidation.  IMPRESSION: Patchy airspace disease in the left upper lobe suggest pneumonia. This is associated bibasilar collapse / consolidation.  Feeding tube tip is in the mid stomach.  CT ABDOMEN AND PELVIS  Findings:  Low attenuation in the liver parenchyma along the falciform ligament may be related to an area of fatty infiltration. No focal abnormality in the spleen on this study performed without intravenous contrast material.  The duodenum, pancreas, and adrenal glands are unremarkable.  No stones are seen in either kidney. Mild fullness of the right intrarenal collecting system is evident and there appears to be some subtle right perinephric and proximal periureteric edema.  No abdominal aortic aneurysm.  No evidence for free fluid or lymphadenopathy in the abdomen.  There is no bowel obstruction.  There is a small amount of free fluid in the pelvis.  The patient appears to have some extraperitoneal edema/inflammation in the right retroperitoneal tissues tracking down into the right pelvic sidewall.  Foley catheter decompresses the urinary bladder.  Uterus is unremarkable.  No definite adnexal mass.  No pelvic sidewall lymphadenopathy.  Scattered diverticuli are seen in the sigmoid colon without diverticulitis.  Terminal ileum is unremarkable.  The retrocecal appendix is normal.  Bone windows show a sclerotic lesion in the left lesser trochanter, incompletely visualized.  Defect in the left iliac crest suggests site of prior bone harvest. There is some body wall edema in the region of the pelvis and gas in the anterior subcutaneous fat of the lower left abdominal wall is presumably from injection site.  IMPRESSION: No definite findings to account for the reported history of clinical sepsis.  There is some fullness of the right intrarenal collecting system with right periureteric edema/inflammation and apparent fluid or inflammation in the right retroperitoneal tissues tracking  down to the right pelvic sidewall.  No evidence for right psoas enlargement suggests the presence of an underlying psoas abscess although the evaluation is limited by the lack of intravenous contrast material. Recent right urinary stone passage or right pyelonephritis would be a consideration.  Sclerotic lesion in the left proximal femurs incompletely visualized.  This has relatively well defined margins were visualized and is probably benign.  Left hip films may prove helpful to further evaluate.  Original Report Authenticated By: ERIC A. MANSELL, M.D.   Mr Brain Wo Contrast  02/07/2012  *RADIOLOGY REPORT*  Clinical Data: CVA.  Mental status change  MRI HEAD WITHOUT CONTRAST  Technique:  Multiplanar, multiecho pulse sequences of the brain and surrounding structures were obtained according to standard protocol without intravenous contrast.  Comparison: CT head 02/06/2012  Findings: Negative for acute infarct.  Generalized atrophy.  No significant chronic ischemic changes are present.  Negative for hemorrhage or mass lesion.   Mild mastoid sinus effusion on the left.  Mild mucosal edema paranasal sinuses.  No air-fluid levels.  IMPRESSION: Negative for acute infarct.  Generalized atrophy without acute intracranial abnormality.  Original Report Authenticated By: Camelia Phenes, M.D.   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  Comparison: No comparison MR of the cervical spine.  Findings: Cervical medullary junction unremarkable.  The cervical cord and upper thoracic cord are of normal signal without findings of increased signal to suggest infarct.  The patient is intubated.  Pooled  secretions.  Vertebral arteries are patent.  C2-3:  Mild facet joint degenerative changes.  Mild bulge with tiny central protrusion.  No mass effect.  C3-4:  Facet joint degenerative changes greater on the left.  Very mild foraminal narrowing greater on the left.  Mild bulge.  C4-5:  Congenital fusion.  No spinal stenosis or foraminal narrowing.  C5-6:  Small to moderate sized broad-based protrusion left posterior lateral position with mild flattening of the left aspect of the cord.  Minimal left foraminal narrowing.  C6-7:  Small to moderate broad-based disc protrusion slightly greater to the left with mass effect upon the ventral nerve roots and minimal left-sided cord contact.  Minimal left foraminal narrowing.  C7-T1:  Mild facet joint degenerative changes.  Mild bilateral foraminal narrowing greater on the left.  Sagittal imaging of the upper thoracic spine without significant spinal stenosis or foraminal narrowing.  IMPRESSION: No evidence of findings to suggest cervical cord infarct.  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  Original Report Authenticated By: Fuller Canada, M.D.   Mr Femur Right Wo Contrast  02/08/2012  *RADIOLOGY  REPORT*  Clinical Data: Right above the knee amputation 02/05/2012.  Altered level of consciousness.  Question infection.  MRI OF THE RIGHT THIGH WITHOUT CONTRAST  Technique:  Multiplanar, multisequence MR imaging of the right thigh was performed.  No intravenous contrast was administered.  Comparison:  Limited comparison is made with pelvic CT 02/08/2012.  Findings:  Study was performed using the body coil.  Both thighs are included on the axial and coronal images.  The patient is status post distal femoral diaphyseal amputation.  The amputation site appears sharp.  Low-level marrow edema within the distal femoral marrow is nonspecific. No cortical destruction is seen.  As partially imaged on  preceding pelvic CT, there is endosteal sclerosis in the left femoral neck extending into the subtrochanteric region.  There is no associated periosteal reaction, marrow edema or focal surrounding soft tissue abnormality.  This is likely a longstanding process.  There is diffuse muscular atrophy in both thighs, especially within the right hamstring musculature.  There is nonspecific multi focal muscular edema, most notably within the right vastus medialis muscle.  Edema is present throughout the left thigh musculature as well.  There is subcutaneous edema in both thighs, greater on the left.  No focal fluid collections are identified.  The visualized bony pelvis demonstrates no significant findings. There is a moderate amount of free pelvic fluid.  Foley catheter is in place.  IMPRESSION:  1.  No specific findings of osteomyelitis status post right above- the-knee amputation.  The amputation site has an expected appearance. 2.  Muscular and subcutaneous edema in both thighs, nonspecific. Myofasciitis not excluded.  There is no focal soft tissue abscess. 3.  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  Original Report Authenticated By: Gerrianne Scale, M.D.   Mr Foot Right W Wo Contrast  02/02/2012  *RADIOLOGY REPORT*  Clinical Data: Heel wound with possible osteomyelitis shown on radiography.  MRI OF THE RIGHT FOREFOOT WITHOUT AND WITH CONTRAST  Technique:  Multiplanar, multisequence MR imaging was performed both before and after administration of intravenous contrast.  Contrast: 15mL MULTIHANCE GADOBENATE DIMEGLUMINE 529 MG/ML IV SOLN  Comparison: 02/02/2012  Findings: Abnormal osseous edema and enhancement noted posteriorly in the calcaneus adjacent to the Achilles insertion site, with adjacent subcutaneous edema.  The appearance is compatible with calcaneal osteomyelitis and adjacent cellulitis.  No abscess is observed.  There is extensive abnormal subcutaneous edema laterally and  along the dorsum of the foot, which not entirely included on today's ankle examination.  No findings of osteomyelitis involving the distal tibia, distal fibula, talus, midfoot, or metatarsal bases.  Low-level edema and enhancement noted tracking deep to the plantar fascia, without drainable abscess observed.  Nonstandard angulation was utilized to best evaluate the calcaneus, and ligamentous assessment of the ankle is problematic, although no obvious lateral ligamentous complex or deltoid ligament discontinuity is observed, and the flexor tendons of the ankle appear grossly intact. Spring ligament appears intact.  IMPRESSION:  1.  Posterior calcaneal osteomyelitis with adjacent cellulitis. 2.  Suspected low-level fasciitis tracking along the margins of the plantar fascia. 3.  Dorsal subcutaneous edema in the foot probably represents cellulitis given the low-level associated enhancement. 4.  No drainable abscess observed.  Original Report Authenticated By: Dellia Cloud, M.D.   Mr Mra Ext Low Left W/o Cm  02/09/2012  *RADIOLOGY REPORT*  Clinical Data:  Sepsis and status post right above knee amputation on the 02/05/2012 for gangrene of the right lower extremity.  MRA OF  THE RIGHT AND LEFT LOWER EXTREMITIES WITHOUT CONTRAST  Technique:  Multiplanar multisequence MR imaging of the extremity was performed.  MRA sequences were obtained utilizing 2-D time-of- flight technique.  No intravenous contrast was administered due to renal failure.  Comparison:   None.  Findings:  The right lower extremity demonstrates patent flow via external and common femoral arteries.  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  Profunda femoral artery and its branches are open into the thigh and to the level of the above knee amputation.  The left lower extremity demonstrates patent external iliac and common femoral arteries.  SFA and profunda femoral arteries are open and show no obstructing lesions.   The popliteal artery is open.  Within the calf, patent flow is demonstrated initially via anterior tibial, posterior tibial and peroneal arteries.  These are able to be followed to the distal calf level.  The vessels are poorly defined at the level of the ankle and foot, likely due to slow flow and limitations of 2-D time-of-flight imaging.  IMPRESSION:  1.  Right lower extremity demonstrates SFA occlusion.  Profunda femoral artery branches are open to the level of above knee amputation. 2.  Left lower extremity demonstrates no significant proximal inflow disease.  Patent flow is demonstrated in the calf via tibial arteries.  Definition of tibial arteries at the level of the ankle and foot is limited, likely reflecting slow flow.  Original Report Authenticated By: Reola Calkins, M.D.   Mr Maxine Glenn Ext Low Right W/o Cm  02/09/2012  *RADIOLOGY REPORT*  Clinical Data:  Sepsis and status post right above knee amputation on the 02/05/2012 for gangrene of the right lower extremity.  MRA OF THE RIGHT AND LEFT LOWER EXTREMITIES WITHOUT CONTRAST  Technique:  Multiplanar multisequence MR imaging of the extremity was performed.  MRA sequences were obtained utilizing 2-D time-of- flight technique.  No intravenous contrast was administered due to renal failure.  Comparison:   None.  Findings:  The right lower extremity demonstrates patent flow via external and common femoral arteries.  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  Profunda femoral artery and its branches are open into the thigh and to the level of the above knee amputation.  The left lower extremity demonstrates patent external iliac and common femoral arteries.  SFA and profunda femoral arteries are open and show no obstructing lesions.  The popliteal artery is open.  Within the calf, patent flow is demonstrated initially via anterior tibial, posterior tibial and peroneal arteries.  These are able to be followed to the distal  calf level.  The vessels are poorly defined at the level of the ankle and foot, likely due to slow flow and limitations of 2-D time-of-flight imaging.  IMPRESSION:  1.  Right lower extremity demonstrates SFA occlusion.  Profunda femoral artery branches are open to the level of above knee amputation. 2.  Left lower extremity demonstrates no significant proximal inflow disease.  Patent flow is demonstrated in the calf via tibial arteries.  Definition of tibial arteries at the level of the ankle and foot is limited, likely reflecting slow flow.  Original Report Authenticated By: Reola Calkins, M.D.   US Renal Port  02/09/2012  *RADIOLOGY REPORT*  Clinical Data: Elevated creatinine, evaluate for hydronephrosis  RENAL/URINARY TRACT ULTRASOUND COMPLETE  Comparison:  CT abdomen pelvis dated 02/08/2012  Findings:  Evaluation is mildly constrained due to portable technique.  Right Kidney:  Measures 11.6 cm.  No  mass or hydronephrosis.  Left Kidney:  Measures 11.2 cm.  No mass or hydronephrosis.  Bladder:  Nonvisualized secondary to indwelling Foley catheter.  IMPRESSION: No hydronephrosis.  Original Report Authenticated By: Charline Bills, M.D.   Dg Chest Port 1 View  02/13/2012  *RADIOLOGY REPORT*  Clinical Data: Endotracheal tube  PORTABLE CHEST - 1 VIEW  Comparison: Yesterday  Findings: Moderate cardiomegaly.  Vascular congestion.  Increasing basilar atelectasis.  No pneumothorax.  Endotracheal tube is stable.  Feeding tube remains with its tip in the fundus of the stomach.  IMPRESSION: Increasing vascular congestion and basilar atelectasis.  Original Report Authenticated By: Donavan Burnet, M.D.   Dg Chest Port 1 View  02/12/2012  *RADIOLOGY REPORT*  Clinical Data: Respiratory failure.  Evaluate endotracheal tube position.  PORTABLE CHEST - 1 VIEW  Comparison: Chest x-ray 02/11/2012.  Findings: An endotracheal tube is in place with tip 6.2 cm above the carina. A feeding tube is seen extending into the  abdomen, with tip in the proximal stomach.  Lung volumes are lower limits of normal.  No definite focal consolidative airspace disease.  Trace bilateral pleural effusions.  Mild pulmonary venous congestion without frank pulmonary edema.  Minimal bibasilar linear opacities are favored to represent subsegmental atelectasis (slightly improved).  Heart size is within normal limits. The patient is rotated to the left on today's exam, resulting in distortion of the mediastinal contours and reduced diagnostic sensitivity and specificity for mediastinal pathology.  Atherosclerotic calcifications within the arch of the aorta.  IMPRESSION: 1.  Support apparatus, as above. 2.  Slight improved aeration with decreasing bibasilar subsegmental atelectasis and decreasing trace bilateral pleural effusions. 3.  Atherosclerosis.  Original Report Authenticated By: Florencia Reasons, M.D.   Dg Chest Port 1 View  02/11/2012  *RADIOLOGY REPORT*  Clinical Data: Check ET tube position  PORTABLE CHEST - 1 VIEW  Comparison: 02/10/2012  Findings: Endotracheal tube terminates 4.5 cm above the carina. Weighted feeding tube terminates in the stomach.  Pulmonary vascular congestion without frank interstitial edema, improved.  Bibasilar opacities, likely atelectasis.  No pneumothorax.  The heart is normal in size.  IMPRESSION: Endotracheal tube terminates 4.5 cm above the carina.  Pulmonary vascular congestion without frank interstitial edema, improved.  Original Report Authenticated By: Charline Bills, M.D.   Dg Chest Port 1 View  02/10/2012  *RADIOLOGY REPORT*  Clinical Data: Evaluate endotracheal tube placement.  PORTABLE CHEST - 1 VIEW  Comparison: Chest x-ray 02/09/2012.  Findings: Compared to the prior examination there has been interval advancement of the endotracheal tube which is now only 7 mm above the carina. A feeding tube is seen extending into the abdomen, tip within the body of the stomach.  Lung volumes are low.  There are  persistent bibasilar opacities which may represent areas of atelectasis and/or consolidation, with superimposed small bilateral pleural effusions.  Overall, aeration has slightly improved.  No evidence of pulmonary edema.  Heart size is mildly enlarged.  The patient is rotated to the right on today's exam, resulting in distortion of the mediastinal contours and reduced diagnostic sensitivity and specificity for mediastinal pathology. Atherosclerotic calcifications within the arch of the aorta.  IMPRESSION: 1.  Low-lying endotracheal tube.  Consider withdrawal 3-4 cm for more optimal placement. 2.  Slight improvement in aeration, however, there continues to be extensive areas of bibasilar atelectasis and/or consolidation with superimposed small bilateral pleural effusions. 3.  Atherosclerosis.  These results were called by telephone on 02/10/2012 at 09:50 a.m. to nurse Lauren in  the ICU, who verbally acknowledged these results.  Original Report Authenticated By: Florencia Reasons, M.D.   Dg Chest Port 1 View  02/09/2012  *RADIOLOGY REPORT*  Clinical Data: Evaluate endotracheal tube, shortness of breath  PORTABLE CHEST - 1 VIEW  Comparison: 02/08/2012; 02/07/2012; 02/06/2012; 02/05/2012; chest CT of 02/08/2012  Findings: Grossly unchanged enlarged cardiac silhouette and mediastinal contours.  Stable position of support apparatus.  No definite pneumothorax, though the right lung apex excluded secondary to overlying chin.  Small bilateral effusions and perihilar and basilar opacities are grossly unchanged.  Unchanged bones.  IMPRESSION: 1.  Stable positioning of support apparatus.  No pneumothorax. 2.  Unchanged small bilateral effusions and perihilar/bibasilar opacities, atelectasis versus infiltrate.  Original Report Authenticated By: Waynard Reeds, M.D.   Dg Chest Port 1 View  02/08/2012  *RADIOLOGY REPORT*  Clinical Data: Ventilator.  PORTABLE CHEST - 1 VIEW  Comparison: 02/07/2012  Findings: Worsening  aeration of the lungs with increasing vascular congestion and bilateral perihilar/lower lobe opacities, likely edema and/or atelectasis.  Low lung volumes.  Mild cardiomegaly.  IMPRESSION: Worsening aeration with increasing edema/atelectasis.  Original Report Authenticated By: Cyndie Chime, M.D.   Dg Chest Port 1 View  02/07/2012  *RADIOLOGY REPORT*  Clinical Data: Retracted endotracheal tube  PORTABLE CHEST - 1 VIEW  Comparison: 02/07/2012  Findings: Endotracheal tube has been pulled back from the right main bronchus and is now 5 mm above the carina.  It could be withdrawn and additional 2 cm.  Feeding tube is in the stomach.  Improved aeration with decrease in bilateral edema.  Bibasilar atelectasis remains.  Central venous catheter tip remains in the SVC.  IMPRESSION: Endotracheal tube has been withdrawn and is now 5 mm above the carina.  Improvement in bilateral edema.  Original Report Authenticated By: Camelia Phenes, M.D.   Portable Chest Xray In Am  02/07/2012  *RADIOLOGY REPORT*  Clinical Data: Endotracheal tube position  PORTABLE CHEST - 1 VIEW  Comparison: 02/06/2012  Findings: Endotracheal tube remains in the proximal right main bronchus, unchanged.  Recommend withdrawal of 4 cm.  The feeding tube is in place with the tip in the body of the stomach.  Right jugular catheter tip in the SVC.  No pneumothorax.  Interval development of bilateral airspace disease suggestive of pulmonary edema.  Increase in bibasilar atelectasis and small pleural effusion.  IMPRESSION: Endotracheal tube remains in the right main bronchus, unchanged. Recommend withdrawal 4 cm.  Increase in bilateral airspace disease, likely pulmonary edema.  Increase in bibasilar atelectasis and bilateral pleural effusions.  Original Report Authenticated By: Camelia Phenes, M.D.   Portable Chest Xray  02/06/2012  *RADIOLOGY REPORT*  Clinical Data: Endotracheal tube placement.  PORTABLE CHEST - 1 VIEW  Comparison: 02/05/2012  Findings:  Endotracheal tube is noted with tip directed to the towards the right mainstem bronchus - recommend 2-3 cm retraction. A right IJ central venous catheters present with tip overlying the upper SVC. Pulmonary vascular congestion and mild left basilar atelectasis again noted. There is no evidence of pneumothorax.  IMPRESSION: Endotracheal tube directed towards the right mainstem bronchus - recommend 2-3 cm retraction.  No other significant changes identified.  These results were called to Maralyn Sago, R.N. on 02/05/2012 at 6:23 p.m.  Original Report Authenticated By: Rosendo Gros, M.D.   Dg Chest Port 1 View  02/05/2012  *RADIOLOGY REPORT*  Clinical Data: Follow-up respiratory failure  PORTABLE CHEST - 1 VIEW  Comparison: Chest radiograph 02/04/2012  Findings: Stable  cardiac silhouette.  Right central venous line is unchanged.  Improvement in bibasilar atelectasis compared to prior. Similar central venous congestion.  No pneumothorax.  IMPRESSION: Improvement in basilar atelectasis.  Original Report Authenticated By: Genevive Bi, M.D.   Dg Chest Port 1 View  02/04/2012  *RADIOLOGY REPORT*  Clinical Data: Respiratory failure, shortness of breath  PORTABLE CHEST - 1 VIEW  Comparison: 02/03/2012  Findings: Cardiomegaly again noted.  Stable right IJ central line position.  Persistent mild congestion/edema.  Probable small left pleural effusion.  Bilateral basilar atelectasis or infiltrate.  IMPRESSION:  Persistent mild congestion/edema.  Probable small left pleural effusion.  Bilateral basilar atelectasis or infiltrate.  Original Report Authenticated By: Natasha Mead, M.D.   Dg Chest Portable 1 View  02/03/2012  *RADIOLOGY REPORT*  Clinical Data: Central line placement.  PORTABLE CHEST - 1 VIEW  Comparison: 11/18/2008.  Findings: Right IJ central line tip projects over the SVC.  No pneumothorax.  Trachea is midline.  Heart is enlarged, stable. There is mild diffuse bilateral air space disease.  No definite pleural  effusions.  IMPRESSION:  1.  Right IJ central line placement without pneumothorax. 2.  Pulmonary edema.  Original Report Authenticated By: Reyes Ivan, M.D.   Dg Abd Portable 1v  02/14/2012  *RADIOLOGY REPORT*  Clinical Data: Abdominal pain.  Panda tube.  PORTABLE ABDOMEN - 1 VIEW  Comparison: 02/13/2012  Findings: The feeding tube is in place, coiled back upon itself with tip in the region of the fundus.  Bowel gas pattern is nonobstructive.  Degenerative changes are seen in the lower spine.  IMPRESSION: Feeding tube tip to the level of the gastric fundus.  Original Report Authenticated By: Patterson Hammersmith, M.D.   Dg Abd Portable 1v  02/13/2012  *RADIOLOGY REPORT*  Clinical Data: Panda tube placement.  PORTABLE ABDOMEN - 1 VIEW  Comparison: Radiographs dated 02/07/2012  Findings: Panda tube is looped in the stomach with the tip in the fundus.  Bowel gas pattern is normal.  Consolidation and/or effusion at the left lung base.  IMPRESSION: Panda tube looped in the stomach as described.  Original Report Authenticated By: Gwynn Burly, M.D.   Dg Abd Portable 1v  02/06/2012  *RADIOLOGY REPORT*  Clinical Data: Panda tube placement  PORTABLE ABDOMEN - 1 VIEW  Comparison: None.  Findings: Feeding tube is in the proximal stomach.  Mild ileus.  Gas is present in the stomach and colon.  Left lower lobe airspace disease  IMPRESSION: Feeding tube is in the proximal stomach.  Original Report Authenticated By: Camelia Phenes, M.D.   Dg Foot Complete Right  02/02/2012  *RADIOLOGY REPORT*  Clinical Data: Wound at heel question osteomyelitis, history diabetes, fell 2 weeks ago  RIGHT FOOT COMPLETE - 3+ VIEW  Comparison: None.  Findings: Diffuse soft tissue swelling. Osseous demineralization. Joint spaces preserved. Soft tissue irregularity and dressing artifacts dorsal to the calcaneus and Achilles insertion. Suspicious area of potential bone destruction is seen at the posterior margin of the calcaneus  suspicious for osteomyelitis. Small plantar calcaneal spur. No additional fracture, dislocation or bone destruction seen.  IMPRESSION: Significant soft tissue swelling diffusely in right foot with osseous demineralization. Suspected area of bone destruction at the posterior margin of the calcaneus deep to the known wound suspicious for osteomyelitis. If further imaging is required recommend MR imaging with and without contrast.  Original Report Authenticated By: Lollie Marrow, M.D.   Anti-infectives: Anti-infectives     Start     Dose/Rate  Route Frequency Ordered Stop   02/11/12 2000   vancomycin (VANCOCIN) 1,500 mg in sodium chloride 0.9 % 500 mL IVPB  Status:  Discontinued        1,500 mg 250 mL/hr over 120 Minutes Intravenous Every 36 hours 02/11/12 0911 02/15/12 1139   02/10/12 1800   meropenem (MERREM) 500 mg in sodium chloride 0.9 % 50 mL IVPB  Status:  Discontinued        500 mg 100 mL/hr over 30 Minutes Intravenous Every 12 hours 02/10/12 1724 02/15/12 1139   02/08/12 1200   imipenem-cilastatin (PRIMAXIN) 250 mg in sodium chloride 0.9 % 100 mL IVPB  Status:  Discontinued        250 mg 200 mL/hr over 30 Minutes Intravenous 4 times per day 02/08/12 1046 02/10/12 1647   02/07/12 1500   piperacillin-tazobactam (ZOSYN) IVPB 2.25 g  Status:  Discontinued        2.25 g 100 mL/hr over 30 Minutes Intravenous 3 times per day 02/07/12 1458 02/08/12 1046   02/07/12 0800   vancomycin (VANCOCIN) IVPB 1000 mg/200 mL premix  Status:  Discontinued        1,000 mg 200 mL/hr over 60 Minutes Intravenous Every 24 hours 02/07/12 1459 02/11/12 0840   02/03/12 2000   vancomycin (VANCOCIN) IVPB 1000 mg/200 mL premix  Status:  Discontinued        1,000 mg 200 mL/hr over 60 Minutes Intravenous Every 12 hours 02/03/12 0919 02/07/12 1459   02/03/12 1730   piperacillin-tazobactam (ZOSYN) IVPB 3.375 g  Status:  Discontinued        3.375 g 100 mL/hr over 30 Minutes Intravenous  Once 02/03/12 1723 02/03/12  1725   02/03/12 1730   vancomycin (VANCOCIN) IVPB 1000 mg/200 mL premix  Status:  Discontinued        1,000 mg 200 mL/hr over 60 Minutes Intravenous  Once 02/03/12 1723 02/03/12 1725   02/03/12 0100   piperacillin-tazobactam (ZOSYN) IVPB 3.375 g  Status:  Discontinued        3.375 g 12.5 mL/hr over 240 Minutes Intravenous Every 8 hours 02/02/12 2233 02/07/12 1458   02/02/12 1700   vancomycin (VANCOCIN) 1,250 mg in sodium chloride 0.9 % 250 mL IVPB  Status:  Discontinued        1,250 mg 166.7 mL/hr over 90 Minutes Intravenous Every 12 hours 02/02/12 1613 02/03/12 0919   02/02/12 1545   piperacillin-tazobactam (ZOSYN) 3.375 g in dextrose 5 % 50 mL IVPB        3.375 g 100 mL/hr over 30 Minutes Intravenous  Once 02/02/12 1530 02/02/12 1937          Assessment/Plan: s/p Procedure(s) (LRB): ABDOMINAL AORTAGRAM (N/A) I reviewed the arteriogram from last Thursday and discuss this with Dr. Edilia Bo. The patient has no indication for bypass. The arterial and arterial flow is intact all the way down to her foot. She does have some moderate stenosis in the proximal and mid superficial femoral artery. She would have no benefit from the femoral to popliteal bypass for attempted limb salvage. I feel that she will go on to amputation the to the degree of tissue loss despite appropriate heel protection. There is no evidence currently of pain or progressive tissue loss that would require urgent amputation. She has continued a up-and-down course since her admission. Fortunately her renal function had no change following arteriography. We will follow along regarding her left heel.   LOS: 20 days   Tanav Orsak 02/22/2012, 8:07 AM

## 2012-02-22 NOTE — Progress Notes (Signed)
I have seen and examined this patient. I have discussed with Dr Konrad Dolores.  I agree with their findings and plans as documented in their progress note for today.  Acute Issues 1. Prolonged post-operative Delirium - Level of consciousness is improving.  Patient stayed focussed on TV during interview. - No evidence of stroke on recent MRI.  - Patient unable to name her location, year, or month on my exam this afternoon. - Assessment: Patient felt to have a toxic-metabolic encephalopathy following surgery. Her level of consciousness is thought to be improving slowly over the weekend. There is no compelling reason for the Valproic acid medication in her problem list.  Can we stop this medication?  If there is an compelling indication, then we will need to check a trough level this Wednesday or Thursday.   2. Left calcaneal osteomyelitis. - Is there a plan for prolonged (4 to 6 weeks) of IV antibiotics for the left heel osteomyelitis?

## 2012-02-22 NOTE — Progress Notes (Signed)
Physical Therapy Treatment Patient Details Name: Alexandra Wiley MRN: [id number] DOB: [birth year] Today's Date: 02/22/2012 Time: 4098-1191 PT Time Calculation (min): 14 min  PT Assessment / Plan / Recommendation Comments on Treatment Session  Much improved participation today with transfer. Per notes pt's family wants to take her home. Rec HHPT and 24 hour assist if she goes home.     Follow Up Recommendations  Home health PT;Supervision/Assistance - 24 hour    Barriers to Discharge        Equipment Recommendations  Wheelchair (measurements);Wheelchair cushion (measurements) (not sure if patient has w/c but will need one for d/c home)    Recommendations for Other Services    Frequency Min 3X/week   Plan Frequency remains appropriate;Discharge plan needs to be updated    Precautions / Restrictions Restrictions Weight Bearing Restrictions: Yes RLE Weight Bearing: Non weight bearing Other Position/Activity Restrictions: Right AKA       Mobility  Bed Mobility Supine to Sit: 6: Modified independent (Device/Increase time);HOB elevated (65 degrees) Sitting - Scoot to Edge of Bed: 4: Min assist Details for Bed Mobility Assistance: pt able to maintain long sitting when HOB lowered, use of pad to assist pt with scooting hips around to the edge of the bed in prep for transfer Transfers Anterior-Posterior Transfer: 1: +2 Total assist Anterior-Posterior Transfers: Patient Percentage: 70% Details for Transfer Assistance: utilized pad to assist with scoot especially across the lip between the chair and bed; bed elevated so that patient going downhill instead of up for transfer; great use of her upper extremities to maneuver in the bed and scoot    Exercises      PT Goals Acute Rehab PT Goals Pt will go Supine/Side to Sit: Independently PT Goal: Supine/Side to Sit - Progress: Updated due to goal met Pt will Sit at [facility] of Bed: Independently PT Goal: Sit at [facility] Of Bed - Progress:  Updated due to goal met Pt will Transfer Bed to Chair/Chair to Bed: with supervision (AP transfers) PT Transfer Goal: Bed to Chair/Chair to Bed - Progress: Updated due to goal met  Visit Information  Last PT Received On: 02/22/12 Assistance Needed: +2 (safety (probably could see +1))    Subjective Data  Subjective: No. (Pt not very talkative but denies pain, and wanting to get out of bed)   Cognition  Overall Cognitive Status: Impaired Cognition - Other Comments: slow processing    Balance  Static Sitting Balance Static Sitting - Balance Support: Bilateral upper extremity supported Static Sitting - Level of Assistance: 5: Stand by assistance Static Sitting - Comment/# of Minutes: pt sat in long sitting x1 minute at least in prep to scoot posterior in chair  End of Session PT - End of Session Equipment Utilized During Treatment: Gait belt Activity Tolerance: Patient tolerated treatment well Patient left: in chair;with call bell/phone within reach Nurse Communication: Mobility status    [facility] HELEN 02/22/2012, 9:31 AM

## 2012-02-22 NOTE — Consult Note (Signed)
Consult Note from the Palliative Medicine Team at [facility] Patient ZO:XWRUEAV KASY IANNACONE      DOB: Mar 02, 1946      WUJ:[id number]   Consult Requested by:Dr M. Chabliss     PCP: Laurena Slimmer, MD, MD Reason for Consultation:Goals of Care     Phone Number:[phone]  Reviewed chart, spoke to staff caring for patient and proceeded to have palliative care meeting regarding goals of care. Participants in the meeting were as follows: patient, Genevie Cheshire (hausband), Apolinar Junes (son), Jeoffrey Massed (son), Renda Rolls (son) on speaker phone. Patient has been living in home with husband, was able to urinary self cath daily, self administer medications including insulin and check blood glucose, transfer from bed to w/c by self or with minimal assist. Did have medical personal coming to house several times per week to for assistance with manual bowel disimpaction.  We discussed current and previous health and functional status of the patient, including her poor oral intake of food and fluids, family and patients desire is to be discharged home with  PT services. We talked about the impact diabetes can have and the potential for future health issues and decline in patients functional and mental status. Husband stated that he would act as primary caregiver at home along with sons and daughter. Patients husband did mention concerns about his ability to administer medications including insulin and checking blood glucose, since wife did all this prior to admission.   We also discussed goals of care and addressed patient/family wishes regarding code status, re-hospitalizations, level of care and medical aggressiveness to treat future medical issues, use of antibiotics, IV hydration and artificial tube feeding interventions. Family and patient in agreement with Full Code status and full scope of medical treatment, use of antibiotics, and IV hydration and feeding tube for defined period of time. A Medical Orders for Scope of Treatment form  was completed and placed on chart. We also discussed the concepts and philosophy of palliative and hospice care,    Assessment and Plan: 1. Code Status:Full  2. Symptom Control:       Patient comfortable at present, no symptom management issues identified 3. Psycho/Social: emotional support to family and patient 4. Spiritual: spiritual consult placed 5. Disposition: family wishes are to have patient discharged to home with PT services. Will continue to follow.  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 PCP did x-ray concerning for osteomyelitis sent to ER for evaluation on 02/02/12   ROS:   + Hx constipation, needs bowel regime due to spinal infarct and subsequent paraplegia, + decreased appetite, states she does not like hospital food. Denies pain, n/v, anxiety, dyspnea, depression or fatigue.    PMH:  Past Medical History  Diagnosis Date  . Ulcer   . Hypertension   . Heart murmur   . Gout   . PVD (peripheral vascular disease)   . Pain in limb   . CHF (congestive heart failure)   . 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 Conus  medullaris syndrome   . Arthritis   . Breast cancer ~ 1975    right  . Carpal tunnel syndrome of right wrist   . High cholesterol   . Angina   . Myocardial infarction 1996  . Pneumonia ~ 2002; ~1975  . Type II diabetes mellitus   . Exertional dyspnea   . Hypothyroidism   . Seizures 02/02/12    "used to; a long time ago"  . Stroke 10/2008    "spinal cord"     PSH: Past Surgical History  Procedure Date  . Foot tendon surgery 2000    Left  foot  . Tubal ligation   . Femur fracture surgery 1960's    Left leg, repair of non-union femur fracture by Dr. Orland Jarred  . Breast lumpectomy ~1975    Right breast carcinoma in situ  . Tonsillectomy and adenoidectomy ~ 1961  . Dilation and curettage of uterus   . Fracture surgery   . Amputation 02/05/2012    Procedure: AMPUTATION BELOW KNEE;  Surgeon: Larina Earthly, MD;  Location: [facility] OR;  Service: Vascular;  Laterality: Right;  right  above knee amputation   I have reviewed the FH and SH and  If appropriate update it with new information. No Known Allergies Scheduled Meds:   . amLODipine  5 mg Oral Daily  . antiseptic oral rinse  15 mL Mouth Rinse QID  . aspirin  81 mg Oral Daily  . atorvastatin  10 mg Oral Daily  . enoxaparin (LOVENOX) injection  30 mg Subcutaneous Q24H  . feeding supplement  237 mL Oral TID WC  . feeding supplement  237 mL Oral BID BM  . insulin aspart  0-15 Units Subcutaneous TID WC  . insulin glargine  15 Units Subcutaneous Daily  . levothyroxine  75 mcg Oral QAC breakfast  . pantoprazole  40 mg Oral QHS  . Valproic Acid  1,000 mg Oral BID   Continuous Infusions:   . sodium chloride 100 mL/hr at 02/22/12 1533   PRN Meds:.acetaminophen, albuterol, sodium phosphate    BP 122/60  Pulse 91  Temp(Src) 98.4 F (36.9 C) (Oral)  Resp 18  Ht 4\' 11"  (1.499 m)  Wt 74.617 kg (164 lb 8 oz)  BMI 33.23 kg/m2  SpO2 97%   PPS: 40%   02/20/12: Albumin 3.1   Intake/Output Summary (Last 24 hours) at 02/22/12 1709 Last data filed at 02/22/12 1533  Gross per 24 hour  Intake   1000 ml  Output   1000 ml  Net      0 ml   LBM:5/30                      Stool Softner: Fleet enema as needed  Physical Exam:  General: Awake, responses to simple questions sluggish but appropriate HEENT: Anicteric, buccal mucosa moist Chest: CTA bilaterally CVS: RRR, systolic murmur noted Abdomen: soft, non-distended, BS audible Ext: R leg BKA, dressing intact, trace edema L leg,  elevated on pillow Neuro: alert, oriented to person and place, not time  Labs: CBC    Component Value Date/Time   WBC 14.7* 02/22/2012 0456   RBC 3.10* 02/22/2012 0456   HGB 8.3* 02/22/2012 0456   HCT 25.6* 02/22/2012 0456   PLT 413* 02/22/2012 0456   MCV 82.6 02/22/2012 0456   MCH 26.8 02/22/2012 0456   MCHC 32.4 02/22/2012 0456   RDW 17.1* 02/22/2012 0456   LYMPHSABS 3.6 02/11/2012 0415   MONOABS 3.2* 02/11/2012  0415   EOSABS 0.0 02/11/2012 0415   BASOSABS 0.0 02/11/2012 0415    BMET    Component Value Date/Time   NA 141 02/22/2012 0456   K 4.1 02/22/2012 0456   CL 108 02/22/2012 0456   CO2 21 02/22/2012 0456   GLUCOSE 80 02/22/2012 0456   BUN 31* 02/22/2012 0456   CREATININE 2.24* 02/22/2012 0456   CALCIUM 8.7 02/22/2012 0456   GFRNONAA 21* 02/22/2012 0456   GFRAA 25* 02/22/2012 0456    CMP     Component Value Date/Time   NA 141 02/22/2012 0456   K 4.1 02/22/2012 0456   CL 108 02/22/2012 0456   CO2 21 02/22/2012 0456   GLUCOSE 80 02/22/2012 0456   BUN 31* 02/22/2012 0456   CREATININE 2.24* 02/22/2012 0456   CALCIUM 8.7 02/22/2012 0456   PROT 6.6 02/20/2012 0630   ALBUMIN 3.1* 02/20/2012 0630   AST 14 02/20/2012 0630   ALT 13 02/20/2012 0630   ALKPHOS 80 02/20/2012 0630   BILITOT 0.3 02/20/2012 0630   GFRNONAA 21* 02/22/2012 0456   GFRAA 25* 02/22/2012 0456    CT scan of the Head Reviewed/Impressions:02/06/12  Findings: Age appropriate atrophy. No acute infarct, hemorrhage,  or mass. Calvarium is intact.     Time In Time Out Total Time Spent with Patient Total Overall Time    4:00p   5:15p   60 min  75 min    Greater than 50%  of this time was spent counseling and coordinating care related to the above assessment and plan.   Freddie Breech, CNS-C Palliative Medicine Team [facility] Team Phone: [phone] Pager: [phone]

## 2012-02-23 MED ORDER — VALPROIC ACID 250 MG/5ML PO SYRP: 1000.0000 mg | ORAL_SOLUTION | Freq: Two times a day (BID) | ORAL | Status: DC

## 2012-02-23 NOTE — Progress Notes (Signed)
Rn called Patient husband to give dc instructions because no one was present when ambulance arrived. Pt has intermittent confusion and Rn did not feel that it would be best to give instructions to her. Pt husband Alexandra Wiley was instructed on the medications to give to Alexandra Wiley and ambulance driver was given dc packet to give to family member. Rn told husband to call for any questions or concerns. Patient in stable condition.

## 2012-02-23 NOTE — Progress Notes (Signed)
Physical Therapy Treatment Patient Details Name: Alexandra Wiley MRN: [id number] DOB: March 18, 1946 Today's Date: 02/23/2012 Time: 0940-1001 PT Time Calculation (min): 21 min  PT Assessment / Plan / Recommendation Comments on Treatment Session  Still more difficulty getting back to bed (PA transfer) versus out of the bed with AP transfer. After speaking with OT I agree this patient may benefit from short stay at [facility] for family and patient training for improved transfers prior to d/c home.     Follow Up Recommendations  Inpatient Rehab    Barriers to Discharge        Equipment Recommendations  Defer to next venue    Recommendations for Other Services Rehab consult  Frequency Min 3X/week   Plan Discharge plan needs to be updated;Frequency remains appropriate    Precautions / Restrictions Precautions Precautions: Fall Precaution Comments: delirium Restrictions Weight Bearing Restrictions: Yes RLE Weight Bearing: Non weight bearing       Mobility  Bed Mobility Bed Mobility: Rolling Right;Rolling Left;Sit to Supine Rolling Right: 5: Supervision;With rail Rolling Left: 5: Supervision;With rail Sit to Supine: 6: Modified independent (Device/Increase time) Details for Bed Mobility Assistance: Pt mod I with long sitting. Uses flexibility to assist with mobility Transfers Anterior-Posterior Transfer: 1: +2 Total assist Anterior-Posterior Transfers: Patient Percentage: 50% Details for Transfer Assistance: Attempted to get pt to talk through how she would transfer bed->chair. She was able to direct staff to position chair but needing increased assist to sequence reciprocal scooting anteriorly onto bed (especially since the bed is higher than the chair); once pt on the bed she was better able to negotiate her leg and reposition her hips for better positioning    Exercises      PT Goals Acute Rehab PT Goals PT Goal: Rolling Supine to Right Side - Progress: Met PT Goal: Rolling Supine  to Left Side - Progress: Met PT Transfer Goal: Bed to Chair/Chair to Bed - Progress: Progressing toward goal PT Goal: Ambulate - Progress: Discontinued (comment)  Visit Information  Last PT Received On: 02/23/12 Assistance Needed: +2 PT/OT Co-Evaluation/Treatment: Yes    Subjective Data  Subjective: Tired   Cognition  Overall Cognitive Status: Impaired Area of Impairment: Attention Arousal/Alertness: Awake/alert Orientation Level: Person Behavior During Session: WFL for tasks performed Current Attention Level: Sustained Attention - Other Comments: easily distracted Awareness of Errors: Assistance required to correct errors made Problem Solving: mod a with functional basic Cognition - Other Comments: slow to process and follow commands    Balance     End of Session PT - End of Session Equipment Utilized During Treatment: Gait belt Activity Tolerance: Patient tolerated treatment well Patient left: in bed;with call bell/phone within reach    [facility] HELEN 02/23/2012, 1:31 PM

## 2012-02-23 NOTE — Progress Notes (Signed)
Family Medicine Teaching Service [facility] Progress Note  Patient name: Alexandra Wiley Medical record number: [id number] Date of birth: Jan 09, 1946 Age: 66 y.o. Gender: female    LOS: 21 days   Primary Care Provider: Laurena Slimmer, MD, MD  Overnight Events:    Objective: Vital signs in last 24 hours: Temp:  [97.7 F (36.5 C)-98.7 F (37.1 C)] 98.6 F (37 C) (06/04 0626) Pulse Rate:  [83-100] 83  (06/04 0626) Resp:  [18] 18  (06/04 0626) BP: (111-134)/(50-63) 111/52 mmHg (06/04 0626) SpO2:  [97 %-100 %] 98 % (06/04 0626) Weight:  [173 lb 9.6 oz (78.744 kg)] 173 lb 9.6 oz (78.744 kg) (06/03 2242)  Wt Readings from Last 3 Encounters:  02/22/12 173 lb 9.6 oz (78.744 kg)  02/22/12 173 lb 9.6 oz (78.744 kg)  02/22/12 173 lb 9.6 oz (78.744 kg)     Current Facility-Administered Medications  Medication Dose Route Frequency Provider Last Rate Last Dose  . 0.45 % sodium chloride infusion   Intravenous Continuous Phebe Colla, MD 100 mL/hr at 02/23/12 0230    . acetaminophen (TYLENOL) tablet 650 mg  650 mg Oral Q4H PRN Chuck Hint, MD      . albuterol (PROVENTIL HFA;VENTOLIN HFA) 108 (90 BASE) MCG/ACT inhaler 2 puff  2 puff Inhalation Q4H PRN Lonia Skinner, MD      . amLODipine (NORVASC) tablet 5 mg  5 mg Oral Daily Simonne Martinet, NP   5 mg at 02/22/12 0949  . aspirin chewable tablet 81 mg  81 mg Oral Daily Coralyn Helling, MD   81 mg at 02/22/12 0949  . atorvastatin (LIPITOR) tablet 10 mg  10 mg Oral Daily Lonia Skinner, MD   10 mg at 02/22/12 0949  . enoxaparin (LOVENOX) injection 30 mg  30 mg Subcutaneous Q24H Carney Living, MD   30 mg at 02/22/12 1141  . feeding supplement (ENSURE COMPLETE) liquid 237 mL  237 mL Oral TID WC Ashley Jacobs, RD      . feeding supplement (GLUCERNA SHAKE) liquid 237 mL  237 mL Oral BID BM Ailene Ards, RD   237 mL at 02/22/12 0953  . insulin aspart (novoLOG) injection 0-15 Units  0-15 Units Subcutaneous TID WC Carney Living, MD   2 Units at 02/20/12 1730  . insulin glargine (LANTUS) injection 15 Units  15 Units Subcutaneous Daily Coralyn Helling, MD   15 Units at 02/21/12 0959  . levothyroxine (SYNTHROID, LEVOTHROID) tablet 75 mcg  75 mcg Oral QAC breakfast Coralyn Helling, MD   75 mcg at 02/22/12 0731  . pantoprazole (PROTONIX) EC tablet 40 mg  40 mg Oral QHS Carney Living, MD   40 mg at 02/22/12 2238  . sodium phosphate (FLEET) 7-19 GM/118ML enema 1 enema  1 enema Rectal Daily PRN Lonia Farber, MD   1 enema at 02/14/12 2042  . Valproic Acid (DEPAKENE) 250 MG/5ML syrup SYRP 1,000 mg  1,000 mg Oral BID Carney Living, MD   1,000 mg at 02/22/12 2238  . 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 No drainage. LLE w/ air boot in place and bandaging. No signs of infection, no discharge or odor.  Neuro: AAOx3.   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 Was transferred to [facility] service due to concern for sepsis. 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 Was re-intubated on 5/18 because of unresponsiveness, decreased sensorium and extubated on 02/13/12. Mental status significantly improved by 02/23/12  # sepsis in the setting of left posterior calcaneal osteomyelitis: S/p right AKA 5/17. MRI on 5/20 did not show any findings of osteomyelitis post right above knee. Questionable myofasciitis in both thighs.  - blood cultures neg >5 days (5/19) .  - sputum cultures negative >5 days (5/19)  - vanc and meropenem  stopped on 02/15/12 (vanc: 5/14-5/27; meropenem 5/20-5/27; was on zosyn from 5/14-5/20, as well as imipenem from 5/20 to 5/23). No evidence of infection at this time.  - WBC stable at 4.7 on 02/22/12  # Seizure / acute encephalopathy: Patient intubated 5/18; likely with metabolic encephalopathy. Was extubated on 05/25. Negative MRI, CT. Initial EEG showing moderate encephalopathy of nonspecific etiology. Latest EEG showed improvement compared to previous. Pt much improved today and will discuss mental status w/ family as they arrive today.  - Continue depakote po 1000mg  bid   # Peripheral vascular disease: left leg arteriogram showed overall patent arteries except for moderate diffuse disease of superficial femoral artery beginning at the origin of the left and extending to the adductor canal. Vascular recommends surgery if heal wound progresses. Family aware of likely future surgery  # Acute renal insufficiency: likely from hypovolemia and ATN. Resolveing Cr 2.24 yesterday. Baseline Cr of 2.0  - Patient does not have a working IV at this time.   # Acute Respiratory failure 2/2 acute encephalopathy: Resolved - stable on room air.   # Diabetes: Well controlled  - novolog sliding scale coverage  - on Lantus 15 units   # h/o spinal cord infarct: Baclofen and gabapentin discontinued for possible cause of AMS. MRI from 05/24 did not show any evidence of spinal cord infarct. 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   # diastolic CHF: Grade 2 diastolic dysfunction with EF of 60-65% on May 2012 echo  - Currently on amlodipine, aspirin, atorvastatin  - metoprolol stopped   # h/o gout: hold allopurinol. No ss of flare.   # h/o HTN: has fluctuating BP depending on which arm is used for measurement. .  - continue amlodipine  - continue to monitor   #h/o hypothyroid:  - continue home levothyroxine   PPx:  - heparin sub q  - PPI   Fen/GI:  Carb modified diet. KVO   Dispo: Pending improvement and likely palliative care meeting. Likely DC to home or CIR today/tomorrow  - Pt is FULL CODE      LOS 21  Signed: Daune Divirgilio, MD Family Medicine Resident PGY-1 [phone] 02/23/2012 8:02 AM

## 2012-02-23 NOTE — Progress Notes (Signed)
Clinical Child psychotherapist (CSW) informed by Dr. Elwyn Reach that pt would need a non emergency ambulance home between 16:00-17:00. CSW contacted pt home and confirmed pt address with pt grand daughter Chana Bode. CSW contacted [facility] for a 16:00 transport. CSW received [facility] auth for transportation [id number] and included it on [facility] sheet (placed in wall-a-roo.) No further CSW needs addressed. CSW signing off.  Theresia Bough, MSW, Theresia Majors [phone]

## 2012-02-23 NOTE — Progress Notes (Signed)
Occupational Therapy Treatment Patient Details Name: CHELCEA ZAHN MRN: [id number] DOB: 11/13/1945 Today's Date: 02/23/2012 Time: 7829-5621 OT Time Calculation (min): 30 min  OT Assessment / Plan / Recommendation Comments on Treatment Session Pt with improved particiption from yesterday. More alert. Less confused with appropriate conversation. Asked nurse for rehab consult. Discussed potential CIR with PT, who agrees this would be beneficial for pt to facilitate safe D/C home with family.    Follow Up Recommendations  Inpatient Rehab    Barriers to Discharge       Equipment Recommendations  None recommended by OT    Recommendations for Other Services Rehab consult  Frequency Min 2X/week   Plan Discharge plan remains appropriate    Precautions / Restrictions Precautions Precautions: Fall Precaution Comments: delirium Restrictions Weight Bearing Restrictions: Yes RLE Weight Bearing: Non weight bearing   Pertinent Vitals/Pain No c/o pain    ADL  Upper Body Bathing: Performed;Minimal assistance (cues for attention during bathing) Lower Body Bathing: Performed;Maximal assistance (physical assist for peri area and LLE) Where Assessed - Lower Body Bathing: Supine, head of bed up;Lean right and/or left ADL Comments: Requires mod vc for sustained attention during ADL. Apparent difficualty with coordination of Lhnad, which is new onset per family.    OT Diagnosis:    OT Problem List:   OT Treatment Interventions:     OT Goals Acute Rehab OT Goals OT Goal Formulation: With patient Time For Goal Achievement: 03/08/12 Potential to Achieve Goals: Good ADL Goals Pt Will Perform Grooming: with set-up;Sitting, edge of bed;Supported ADL Goal: Grooming - Progress: Progressing toward goals Pt Will Perform Upper Body Bathing: with set-up;Sitting, edge of bed;Supported ADL Goal: Upper Body Bathing - Progress: Met Pt Will Perform Lower Body Bathing: with min assist;Supine, head of bed  up;Supine, rolling right and/or left;with cueing (comment type and amount) (win min vc for attention) ADL Goal: Lower Body Bathing - Progress: Goal set today Pt Will Perform Upper Body Dressing: with set-up;Sitting, bed;Supported ADL Goal: Upper Body Dressing - Progress: Met Pt Will Perform Lower Body Dressing: with mod assist;Supine, head of bed up;Supine, rolling right and/or left;with cueing (comment type and amount);with caregiver independent in assisting ADL Goal: Lower Body Dressing - Progress: Progressing toward goals Pt Will Transfer to Toilet: with 2+ total assist;Anterior-posterior transfer;Drop arm 3-in-1 ADL Goal: Toilet Transfer - Progress: Discontinued (comment) Additional ADL Goal #1: Family will demonstrate an/post transer with pt with S. ADL Goal: Additional Goal #1 - Progress: Progressing toward goals Miscellaneous OT Goals Miscellaneous OT Goal #1: Pt will complete supine <> sit EOB total +2 pt50% as precursor to adls at EOB OT Goal: Miscellaneous Goal #1 - Progress: Met Miscellaneous OT Goal #2: Pt/family to complete BUE strengtheing HEP with theraband/putty to increase strength and endurance for ADL. OT Goal: Miscellaneous Goal #2 - Progress: Progressing toward goals  Visit Information  Last OT Received On: 02/23/12    Subjective Data      Prior Functioning       Cognition  Overall Cognitive Status: Impaired Area of Impairment: Attention Arousal/Alertness: Awake/alert Orientation Level: Person Behavior During Session: WFL for tasks performed Current Attention Level: Sustained Attention - Other Comments: easily distracted Awareness of Errors: Assistance required to identify errors made;Assistance required to correct errors made Problem Solving: mod a with functional basic Cognition - Other Comments: Pt    Mobility Bed Mobility Bed Mobility: Rolling Right;Rolling Left;Sit to Supine Rolling Right: 5: Supervision;With rail Rolling Left: 5: Supervision;With  rail Sit to Supine: 6:  Modified independent (Device/Increase time) Details for Bed Mobility Assistance: Pt mod I with long sitting. Uses flexibility to assist with mobility Transfers Details for Transfer Assistance: cues for attention and sequencing during ant/post scoot. REquires total a of 2 to transfer FROM chair to bed due to going uphill. Cues for safety also.    Exercises    Balance    End of Session OT - End of Session Activity Tolerance: Patient tolerated treatment well Patient left: in bed;with call bell/phone within reach;with family/visitor present Nurse Communication: Other (comment) (need for rehab consult)   Danely Bayliss,HILLARY 02/23/2012, 11:18 AM

## 2012-02-23 NOTE — Progress Notes (Signed)
I have seen and examined this patient. I have discussed with Dr Merrell.  I agree with their findings and plans as documented in their progress note for today.  

## 2012-02-23 NOTE — Discharge Instructions (Signed)
Alexandra Wiley is doing great. She had a severe infection in her right leg that requires it to amputation. This has taken her a long time to recover but she is doing well. Please continue all of her prescribed medications. Please follow up with her primary care physician within the next few days.

## 2012-02-24 ENCOUNTER — Encounter (HOSPITAL_COMMUNITY): Payer: Self-pay | Admitting: Emergency Medicine

## 2012-02-24 ENCOUNTER — Emergency Department (HOSPITAL_COMMUNITY)
Admission: EM | Admit: 2012-02-24 | Discharge: 2012-02-24 | Disposition: A | Discharge status: Home / Self Care | Payer: Medicare Other | Attending: Emergency Medicine | Admitting: Emergency Medicine

## 2012-02-24 DIAGNOSIS — N390 Urinary tract infection, site not specified: Secondary | ICD-10-CM | POA: Insufficient documentation

## 2012-02-24 DIAGNOSIS — I509 Heart failure, unspecified: Secondary | ICD-10-CM | POA: Insufficient documentation

## 2012-02-24 DIAGNOSIS — I252 Old myocardial infarction: Secondary | ICD-10-CM | POA: Insufficient documentation

## 2012-02-24 DIAGNOSIS — Z79899 Other long term (current) drug therapy: Secondary | ICD-10-CM | POA: Insufficient documentation

## 2012-02-24 DIAGNOSIS — E7800 Pure hypercholesterolemia, unspecified: Secondary | ICD-10-CM | POA: Insufficient documentation

## 2012-02-24 DIAGNOSIS — I10 Essential (primary) hypertension: Secondary | ICD-10-CM | POA: Insufficient documentation

## 2012-02-24 DIAGNOSIS — E119 Type 2 diabetes mellitus without complications: Secondary | ICD-10-CM | POA: Insufficient documentation

## 2012-02-24 DIAGNOSIS — E039 Hypothyroidism, unspecified: Secondary | ICD-10-CM | POA: Insufficient documentation

## 2012-02-24 DIAGNOSIS — N319 Neuromuscular dysfunction of bladder, unspecified: Secondary | ICD-10-CM | POA: Insufficient documentation

## 2012-02-24 DIAGNOSIS — R339 Retention of urine, unspecified: Secondary | ICD-10-CM

## 2012-02-24 DIAGNOSIS — Z8739 Personal history of other diseases of the musculoskeletal system and connective tissue: Secondary | ICD-10-CM | POA: Insufficient documentation

## 2012-02-24 DIAGNOSIS — Z853 Personal history of malignant neoplasm of breast: Secondary | ICD-10-CM | POA: Insufficient documentation

## 2012-02-24 HISTORY — DX: Neuromuscular dysfunction of bladder, unspecified: N31.9

## 2012-02-24 LAB — GLUCOSE, CAPILLARY: Glucose-Capillary: 77 mg/dL (ref 70–99)

## 2012-02-24 LAB — URINALYSIS, ROUTINE W REFLEX MICROSCOPIC
Bilirubin Urine: NEGATIVE
Specific Gravity, Urine: 1.009 (ref 1.005–1.030)
Urobilinogen, UA: 0.2 mg/dL (ref 0.0–1.0)

## 2012-02-24 LAB — URINE MICROSCOPIC-ADD ON

## 2012-02-24 MED ORDER — CEPHALEXIN 500 MG PO CAPS: 500.0000 mg | ORAL_CAPSULE | Freq: Four times a day (QID) | ORAL | Status: AC

## 2012-02-24 MED ORDER — LIDOCAINE HCL (PF) 1 % IJ SOLN
INTRAMUSCULAR | Status: AC
  Filled 2012-02-24: qty 5

## 2012-02-24 MED ORDER — CEFTRIAXONE SODIUM 1 G IJ SOLR
1.0000 g | Freq: Once | INTRAMUSCULAR | Status: AC
  Administered 2012-02-24: 1 g via INTRAMUSCULAR
  Filled 2012-02-24: qty 10

## 2012-02-24 NOTE — ED Notes (Signed)
CBG- 77 

## 2012-02-24 NOTE — ED Provider Notes (Signed)
History     CSN: [id number]  Arrival date & time 02/24/12  1154   First MD Initiated Contact with Patient 02/24/12 1352      Chief Complaint  Patient presents with  . Urinary Retention     HPI Pt was seen at 1410.  Per pt and her husband, c/o gradual onset and persistence of constant urinary retention since this morning.  Has been associated with "feeling pressure" in her bladder area.  Pt's last urinary catheterization was done by her Home Health RN this morning approx 0500.  Pt has hx of self catheterizing since 2009 s/p CVA.  States since her right AKA surgery and d/c from hospital yesterday she "can't seem to get it in the right spot" to cath herself at home.  Pt states she called her PMD and was told to come to the ED to have a foley catheter placed and he will see her in the office tomorrow.  Denies N/V/D, no fevers, no back pain, no CP/SOB.        Past Medical History  Diagnosis Date  . Ulcer   . Hypertension   . Heart murmur   . Gout   . PVD (peripheral vascular disease)   . Pain in limb   . CHF (congestive heart failure)   . 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 Conus medullaris syndrome   . Arthritis   . Breast cancer ~ 1975    right  . Carpal tunnel syndrome of right wrist   . High cholesterol   . Angina   . Myocardial infarction 1996  . Pneumonia ~ 2002; ~1975  . Type II diabetes mellitus   . Exertional dyspnea   . Hypothyroidism   . Seizures 02/02/12    "used to; a long time ago"  . Stroke 10/2008    "spinal cord"  . Neurogenic bladder     Past Surgical History  Procedure Date  . Foot tendon surgery 2000    Left foot  . Tubal ligation   . Femur fracture surgery 1960's    Left leg, repair of non-union femur fracture by Dr. Orland Jarred  . Breast lumpectomy ~1975    Right breast carcinoma in situ  . Tonsillectomy and adenoidectomy ~ 1961  . Dilation and curettage of uterus   . Fracture surgery   . Amputation 02/05/2012    Procedure: AMPUTATION  BELOW KNEE;  Surgeon: Larina Earthly, MD;  Location: [facility] OR;  Service: Vascular;  Laterality: Right;  right  above knee amputation    Family History  Problem Relation Age of Onset  . Arthritis Mother   . Hypertension Mother   . Heart disease Father   . Diabetes Father     History  Substance Use Topics  . Smoking status: Current Everyday Smoker -- 0.5 packs/day for 25 years    Types: Cigarettes  . Smokeless tobacco: Never Used   Comment: "quit for 7 months; started back 10/2011"  . Alcohol Use: Yes     02/02/12 "last alcohol ~ 2008"    Review of Systems ROS: Statement: All systems negative except as marked or noted in the HPI; Constitutional: Negative for fever and chills. ; ; Eyes: Negative for eye pain, redness and discharge. ; ; ENMT: Negative for ear pain, hoarseness, nasal congestion, sinus pressure and sore throat. ; ; Cardiovascular: Negative for chest pain, palpitations, diaphoresis, dyspnea and peripheral edema. ; ; Respiratory: Negative for cough, wheezing and stridor. ; ; Gastrointestinal: Negative  for nausea, vomiting, diarrhea, abdominal pain, blood in stool, hematemesis, jaundice and rectal bleeding. . ; ; Genitourinary: +urinary retention.  Negative for dysuria, flank pain and hematuria. ; ; Musculoskeletal: Negative for back pain and neck pain. Negative for swelling and trauma.; ; Skin: Negative for pruritus, rash, abrasions, blisters, bruising and skin lesion.; ; Neuro: Negative for headache, lightheadedness and neck stiffness. Negative for weakness, altered level of consciousness , altered mental status, extremity weakness, paresthesias, involuntary movement, seizure and syncope.     Allergies  Review of patient's allergies indicates no known allergies.  Home Medications   Current Outpatient Rx  Name Route Sig Dispense Refill  . ALLOPURINOL 100 MG PO TABS Oral Take 100 mg by mouth daily.      Marland Kitchen AMLODIPINE BESYLATE 5 MG PO TABS Oral Take 5 mg by mouth daily.      .  ASPIRIN EC 81 MG PO TBEC Oral Take 81 mg by mouth daily.    . ATORVASTATIN CALCIUM 10 MG PO TABS Oral Take 10 mg by mouth daily.    Marland Kitchen BACLOFEN 10 MG PO TABS Oral Take 10 mg by mouth 2 (two) times daily.     . COLCHICINE 0.6 MG PO TABS Oral Take 0.6 mg by mouth every 12 (twelve) hours as needed. For gout.    Marland Kitchen DICLOFENAC SODIUM 1 % TD GEL Topical Apply 1 application topically 3 (three) times daily as needed. For pain    . GABAPENTIN 300 MG PO CAPS Oral Take 300 mg by mouth 4 (four) times daily.      . INSULIN ASPART PROT & ASPART (70-30) 100 UNIT/ML [place] SUSP Subcutaneous Inject 12 Units into the skin as directed. Sliding scale. 12 units if blood sugar is 150, 16 units if sugar is 200 and above    . LEVOTHYROXINE SODIUM 75 MCG PO TABS Oral Take 75 mcg by mouth daily.      Marland Kitchen METHOCARBAMOL 500 MG PO TABS Oral Take 500 mg by mouth 4 (four) times daily.      Marland Kitchen METOPROLOL-HYDROCHLOROTHIAZIDE 100-25 MG PO TABS Oral Take 0.5 tablets by mouth daily.     Marland Kitchen NORTRIPTYLINE HCL 25 MG PO CAPS Oral Take 25 mg by mouth at bedtime.    . OMEPRAZOLE 20 MG PO CPDR Oral Take 20 mg by mouth daily.      . OXYBUTYNIN CHLORIDE ER 10 MG PO TB24 Oral Take 10 mg by mouth daily.      . OXYCODONE HCL 5 MG PO CAPS Oral Take 5 mg by mouth every 6 (six) hours as needed. For pain    . VALPROIC ACID 250 MG/5ML PO SYRP Oral Take 20 mLs (1,000 mg total) by mouth 2 (two) times daily. 600 mL 3  . CEPHALEXIN 500 MG PO CAPS Oral Take 1 capsule (500 mg total) by mouth 4 (four) times daily. 40 capsule 0    BP 139/70  Pulse 91  Temp(Src) 98.7 F (37.1 C) (Oral)  Resp 18  SpO2 99%  Physical Exam 1415: Physical examination:  Nursing notes reviewed; Vital signs and O2 SAT reviewed;  Constitutional: Well developed, Well nourished, Well hydrated, In no acute distress; Head:  Normocephalic, atraumatic; Eyes: EOMI, PERRL, No scleral icterus; ENMT: Mouth and pharynx normal, Mucous membranes moist; Neck: Supple, Full range of motion, No  lymphadenopathy; Cardiovascular: Regular rate and rhythm, No murmur, rub, or gallop; Respiratory: Breath sounds clear & equal bilaterally, No rales, rhonchi, wheezes, or rub, Normal respiratory effort/excursion; Chest: Nontender, Movement normal;  Abdomen: Soft, Nontender, Nondistended, Normal bowel sounds; Genitourinary: No CVA tenderness; Extremities: Pulses normal, No tenderness, No edema, No calf edema or asymmetry.; Neuro: AA&Ox3, Major CN grossly intact. Speech clear. +right AKA and hx LE's paraplegia, otherwise no gross focal motor deficits in extremities.; Skin: Color normal, Warm, Dry   ED Course  Procedures    MDM  MDM Reviewed: previous chart, nursing note and vitals Interpretation: labs     Results for orders placed during the hospital encounter of 02/24/12  URINALYSIS, ROUTINE W REFLEX MICROSCOPIC      Component Value Range   Color, Urine YELLOW  YELLOW    APPearance TURBID (*) CLEAR    Specific Gravity, Urine 1.009  1.005 - 1.030    pH 6.0  5.0 - 8.0    Glucose, UA NEGATIVE  NEGATIVE (mg/dL)   Hgb urine dipstick LARGE (*) NEGATIVE    Bilirubin Urine NEGATIVE  NEGATIVE    Ketones, ur NEGATIVE  NEGATIVE (mg/dL)   Protein, ur 161 (*) NEGATIVE (mg/dL)   Urobilinogen, UA 0.2  0.0 - 1.0 (mg/dL)   Nitrite POSITIVE (*) NEGATIVE    Leukocytes, UA LARGE (*) NEGATIVE   URINE MICROSCOPIC-ADD ON      Component Value Range   WBC, UA TOO NUMEROUS TO COUNT  <3 (WBC/hpf)   RBC / HPF 3-6  <3 (RBC/hpf)   Bacteria, UA RARE  RARE   GLUCOSE, CAPILLARY      Component Value Range   Glucose-Capillary 77  70 - 99 (mg/dL)     0:96 PM:  Foley placed, approx of cloudy yellow urine removed from bladder.  Pt states she feels "less pressure" now and wants to go home.  Pt and her husband both want to continue foley cath because "she just can't cath myself on my own."  Pt is agreeable to f/u with her PMD tomorrow as he requested.  Pt does not want to stay in the ED any longer and wants to go  home now.  +UTI, UC pending.  First dose abx given IM in ED, rx written.  Dx testing d/w pt and family.  Questions answered.  Verb understanding, agreeable to d/c home with outpt f/u with her PMD tomorrow as previously scheduled.          Laray Anger, DO 02/26/12 1617

## 2012-02-24 NOTE — ED Notes (Signed)
Pt reports she is here to have "bladder services" done, states she normally self cath herself until she had a RAKA on 02/05/2012, spouse reports she is not "stable enough" to cath herself, pt just released from our facility yesterday. HHRN came to house and emptied pt's bladder 0500 this am. Dr. Chestine Spore sent pt to ED to have an indwelling foley catheter placed and is to f/u w/Dr. Chestine Spore tomorrow at his office.

## 2012-02-24 NOTE — Progress Notes (Signed)
Clinical Child psychotherapist (CSW) received a call from [place] with Palliative Care that pt spouse had questions for CSW. CSW contacted pt spouse who informed CSW that he had questions pertaining to pt continence however pt spouse stated he contacted pt PCP and scheduled a 14:00 appt with PCP today. Spouse had no questions for CSW.  Theresia Bough, MSW, Theresia Majors [phone]

## 2012-02-24 NOTE — Discharge Instructions (Signed)
RESOURCE GUIDE  Chronic Pain Problems: Contact [facility]  [phone] Patients need to be referred by their primary care doctor.  Insufficient Money for Medicine: Contact United Way:  call "211" or [facility] [phone].  No Primary Care Doctor: - Call [facility]  [phone] - can help you locate a primary care doctor that  accepts your insurance, provides certain services, etc. - Physician Referral Service- 1-800-533-3463  Agencies that provide inexpensive medical care: - [facility]  [phone] - [facility]  [phone] - [facility]  [phone] - [facility] Clinic  [phone] - Planned Parenthood  [phone] - [facility]  [phone]  Medicaid-accepting [place] Providers: - [facility]- [address]  [phone], Mon-Fri 9am-7pm, Sat 9am-1pm - [facility]- [address], [address]  [phone] - [facility]- [address], [address]  [phone] - [facility]- [address] [place] [address]  [phone] - Veita Bland- [address], [phone]  Only accepts [place] Access Medicaid patients after they have their name  applied to their card  Self Pay (no insurance) in [place]: - Sickle Cell Patients: Alexandra Wiley, [facility]  [address], [phone] - [facility]- [address]  [phone]       -     [facility]- [address]       -     [facility]- see information above (Speak to Pam H if you do not have insurance)       -  [facility]- [address], [phone]       -  [facility] [place]- [address],  [phone]       -  [facility]- [address] [place] [address], [phone]       -  Alexandra Osei-Bonsu-  [address], [address], [place], [phone]       -  [facility]- [address], [phone]       -  [facility]- [address] [place] [address], [phone], also [address], [phone]       -    [facility]- [address], [phone], 1st & 3rd Saturday   every month, 10am-1pm  1) Find a Doctor and Pay Out of Pocket Although you won't have to find out who is covered by your insurance plan, it is a good idea to ask around and get recommendations. You will then need to call the office and see if the doctor you have chosen will accept you as a new patient and what types of options they offer for patients who are self-pay. Some doctors offer discounts or will set up payment plans for their patients who do not have insurance, but you will need to ask so you aren't surprised when you get to your appointment.  2) Contact Your Local Health Department Not all health departments have doctors that can see patients for sick visits, but many do, so it is worth a call to see if yours does. If you don't know where your local health department is, you can check in your phone book. The CDC also has a tool to help you locate your state's health department, and many state websites also have   listings of all of their local health departments.  3) Find a Walk-in Clinic If your illness is not likely to be very severe or complicated, you may want to try a walk in clinic. These are popping up all over the country in pharmacies, drugstores, and shopping centers. They're usually staffed by nurse practitioners or physician assistants that have been trained to treat common illnesses and complaints. They're usually fairly quick and inexpensive. However, if you have serious medical issues or chronic medical problems, these are probably not your best option  STD Testing - [place] [facility] [place], [facility] Clinic, [address], [place], phone [phone] or 1-877-539-9860.  Monday - Friday, call for an appointment. - [place]  [facility] [place], [facility]D Clinic, [address], [place], phone [phone] or 1-877-539-9860.  Monday - Friday, call for an appointment.  Abuse/Neglect: - [place] Child Abuse Hotline (336) 641-3795 - [place] Child Abuse Hotline 800-378-5315 (After Hours)  Emergency Shelter:  [facility] (336) 271-5985  Maternity Homes: - [facility] (336) 275-9566 - [facility] (704) 372-4663  MRSA Hotline #:   832-7006  [place] Resources  [facility] [place]  [employer] [place] [facility] [address]                 [address]         [address]  [place]                                               [place]                              [place] Phone:  [phone]                                  Phone:  [phone]                   Phone:  [phone]  [place] [facility], [phone] - [place] Services - [facility]- 1-888-581-9988       -     [facility] in [place], [address],                                  336-349-4454, Insurance  [place] Child Abuse Hotline (336) 342-1394 or (336) 342-3537 (After Hours)   Behavioral Health Services  Substance Abuse Resources: - [facility]  336-882-2125 - [facility] 336-784-9470 - [facility] 336-285-9073 - [facility] 336-845-3988 - Residential & Outpatient Substance Abuse Program  800-659-3381  Psychological Services: -  Health  [phone] - [facility]  [phone] - [place] [facility], [address], [place], ACCESS LINE: 1-800-853-5163 or 336-641-4981, Http://www.[facility].com/services/adult.htm  Dental Assistance  If unable to pay or uninsured, contact:  [facility] or [place] [facility] to become qualified for the adult dental  clinic.  Patients with Medicaid:  [facility] [address], [phone], [phone]  If unable   to pay, or uninsured, contact [facility] 7038636987) or [facility] (805) 424-1589 in [place], [phone] in [facility]) to become qualified for the adult dental clinic  Other Low-Cost Community Dental Services: - [facility]- [address] [place], [place], [place], [address], [phone], Ext. [address], 2nd and 4th Thursday of the month at 6:30am.  10 clients each day by appointment, can sometimes see walk-in patients if someone does not show for an appointment. [facility]- [address] [place], [place], [address], [phone] - [facility]- [address], [place], [place], [address], [phone] [facility]- [phone] [facility] Health Department- [phone] [facility] [phone]     Take the prescription as directed.  Call your regular medical doctor today to confirm your follow up appointment for tomorrow.  Return to the Emergency Department immediately sooner if worsening.

## 2012-02-24 NOTE — ED Notes (Signed)
Pt from home and normally self caths but was recently discharged from hospital after R AKA and now not able to self cath; family sts working on home health set up for future but needs to be cathed today and may need foley placed; pt does leak urine and feels when bladder is full but can empty completely after CVA

## 2012-02-24 NOTE — ED Notes (Signed)
MD at bedside. resident 

## 2012-02-25 ENCOUNTER — Other Ambulatory Visit: Payer: Self-pay | Admitting: Physical Medicine & Rehabilitation

## 2012-02-26 NOTE — Discharge Summary (Signed)
I have seen and examined this patient. I have discussed with  Dr Elizebeth Koller.  I agree with their findings and plans as documented in their Discharge note.

## 2012-02-28 LAB — URINE CULTURE: Colony Count: >=100000

## 2012-02-29 ENCOUNTER — Encounter: Payer: Self-pay | Admitting: Vascular Surgery

## 2012-03-01 ENCOUNTER — Ambulatory Visit (INDEPENDENT_AMBULATORY_CARE_PROVIDER_SITE_OTHER): Payer: Medicare Other | Admitting: Vascular Surgery

## 2012-03-01 ENCOUNTER — Encounter: Payer: Self-pay | Admitting: Vascular Surgery

## 2012-03-01 VITALS — BP 113/70 | HR 79 | Temp 98.2°F | Ht 59.0 in | Wt 179.0 lb

## 2012-03-01 DIAGNOSIS — I739 Peripheral vascular disease, unspecified: Secondary | ICD-10-CM

## 2012-03-01 DIAGNOSIS — L98499 Non-pressure chronic ulcer of skin of other sites with unspecified severity: Secondary | ICD-10-CM | POA: Insufficient documentation

## 2012-03-01 DIAGNOSIS — I7025 Atherosclerosis of native arteries of other extremities with ulceration: Secondary | ICD-10-CM | POA: Insufficient documentation

## 2012-03-01 NOTE — Progress Notes (Signed)
The patient presents today for followup of right above-knee amputation and left heel ulcer. She was admitted to [facility] in mid March 16 extensive gangrene in her right foot. This involved her heel down to the calcaneus in her distal forefoot. She had tenderness up onto her mid calf. Initially she refused consideration for amputation but then consented. She is here today with her daughter who is angry and reports that her family and I did not have any discussion of above-knee versus below-knee. I splayed out specifically recall discussing the option of above-knee versus below-knee in the rectum and above-knee to which the patient agreed. I reviewed the notes from May 17th 2013 and this clearly was the case. She had essentially no possibility for healing of her right below-knee amputation. She did have a protracted hospitalization with respiratory failure being vent dependent and neurologic changes. She eventually recovered from this. She did have renal insufficiency associated with this as well. She had a left heel ulcer which was not as extensive on presentation and once her medical status stabilizes she did undergo arteriogram. This showed no reconstructable disease with moderate irregularity throughout the superficial femoral artery but in-line flow down to the level of her foot. I did discuss this with the patient and her daughter today explaining that she does not have any reconstructions options. She does continue to have some necrotic change in her left heel and I explained that she was extremely high risk for progressing to left leg amputation. She is not having pain currently therefore we will continue to keep a close eye on this in the office. Her right above-knee amputation stump is healing. She did have some breakdown on the lateral aspect of this and therefore her staples are not removed today. She will be seen again in 2 weeks for staple removal. He will continue to watch her left heel again  feel that she is at high risk for amputation. The family is discussing options for prosthetic fitting and I explained that this would not be possible until she completely healed her above-knee amputation. Also explained that with her prior spinal cord ischemic stroke and generalized disability that she would have a very difficult time with walking prosthesis. We will see her again in 2 weeks for continued followup

## 2012-03-14 ENCOUNTER — Encounter: Payer: Self-pay | Admitting: Vascular Surgery

## 2012-03-14 ENCOUNTER — Encounter: Payer: Self-pay | Admitting: Neurosurgery

## 2012-03-15 ENCOUNTER — Encounter: Payer: Self-pay | Admitting: Neurosurgery

## 2012-03-15 ENCOUNTER — Ambulatory Visit (INDEPENDENT_AMBULATORY_CARE_PROVIDER_SITE_OTHER): Payer: Medicare Other | Admitting: Neurosurgery

## 2012-03-15 VITALS — BP 95/57 | HR 99 | Temp 97.9°F | Resp 14 | Ht 59.0 in | Wt 164.0 lb

## 2012-03-15 DIAGNOSIS — L98499 Non-pressure chronic ulcer of skin of other sites with unspecified severity: Secondary | ICD-10-CM

## 2012-03-15 DIAGNOSIS — I739 Peripheral vascular disease, unspecified: Secondary | ICD-10-CM

## 2012-03-15 NOTE — Progress Notes (Addendum)
Subjective:     Patient ID: Alexandra Wiley, female   DOB: 01-01-1946, 66 y.o.   MRN: [id number]  HPI: 66 year old female patient of Dr. Arbie Wiley who underwent a right above-the-knee amputation for nonhealing ulcer 2 weeks ago. The patient also has ulcerated spot on her left heal which she states does not hurt, she has no pain in her foot or toes on the left. There is no gangrenous material noted around the ulceration in her toes appear to be well-perfused. The patient complains of no pain on the right due to the amputation.   Review of Systems: 12 point review systems is notable for the difficulties described above otherwise unremarkable.     Objective:   Physical Exam: Afebrile, vital signs stable, right above-the-knee amputation wound is healed well no redness no drainage, however there is a small open area on the lateral side of the incision to which we will apply some benzoin and Steri-Strips. Left heel ulceration appears to be granulating, it is red and beefy I do not see any necrotic tissue around ulceration or along the foot. The patient can wiggle her toes on the left and straighten her knee from a 45 position.     Assessment:     Healing right AKA, ulceration left heel appears to be healing although I did not see the wound last visit he does have granulation tissue around the circumference without gangrenous tissue.    Plan:     I will request a home care nurse to help with his left heel everyday, the patient has an appointment followup with Dr. Arbie Wiley next Tuesday and knows to call our office if she has difficulty before that time. The patient and her daughter's questions were encouraged and answered.     Alexandra Wiley ANP  Clinic M.D.: Alexandra Wiley

## 2012-03-21 ENCOUNTER — Encounter: Payer: Self-pay | Admitting: Vascular Surgery

## 2012-03-22 ENCOUNTER — Ambulatory Visit (INDEPENDENT_AMBULATORY_CARE_PROVIDER_SITE_OTHER): Payer: Medicare Other | Admitting: Vascular Surgery

## 2012-03-22 ENCOUNTER — Encounter: Payer: Self-pay | Admitting: Vascular Surgery

## 2012-03-22 VITALS — BP 107/69 | HR 90 | Temp 98.5°F | Ht 59.0 in | Wt 164.0 lb

## 2012-03-22 DIAGNOSIS — L98499 Non-pressure chronic ulcer of skin of other sites with unspecified severity: Secondary | ICD-10-CM

## 2012-03-22 DIAGNOSIS — I739 Peripheral vascular disease, unspecified: Secondary | ICD-10-CM

## 2012-03-22 NOTE — Progress Notes (Signed)
[facility] POST OPERATIVE OFFICE NOTE  CC:  F/u for surgery  HPI:  This is a 66 y.o. female who is s/p right AKA several weeks ago.  Her staples were removed at the last visit and steri strips were applied.  She returns today for check of her stump and the ulcer of her left heel.  She denies any fever or chills, but states that she is having some pain in her thigh of her right leg and the lateral aspect of her stump.  No Known Allergies  Current Outpatient Prescriptions  Medication Sig Dispense Refill  . allopurinol (ZYLOPRIM) 100 MG tablet Take 100 mg by mouth daily.        Marland Kitchen amLODipine (NORVASC) 5 MG tablet Take 5 mg by mouth daily.        Marland Kitchen aspirin EC 81 MG tablet Take 81 mg by mouth daily.      Marland Kitchen atorvastatin (LIPITOR) 10 MG tablet Take 10 mg by mouth daily.      . baclofen (LIORESAL) 10 MG tablet Take 10 mg by mouth 2 (two) times daily.       . colchicine 0.6 MG tablet Take 0.6 mg by mouth every 12 (twelve) hours as needed. For gout.      . diclofenac sodium (VOLTAREN) 1 % GEL Apply 1 application topically 3 (three) times daily as needed. For pain      . gabapentin (NEURONTIN) 300 MG capsule TAKE ONE CAPSULE BY MOUTH 4 TIMES DAILY  120 capsule  3  . insulin aspart protamine-insulin aspart (NOVOLOG 70/30) (70-30) 100 UNIT/ML injection Inject 12 Units into the skin as directed. Sliding scale. 12 units if blood sugar is 150, 16 units if sugar is 200 and above      . levothyroxine (SYNTHROID, LEVOTHROID) 75 MCG tablet Take 75 mcg by mouth daily.        . methocarbamol (ROBAXIN) 500 MG tablet Take 500 mg by mouth 4 (four) times daily.        . metoprolol-hydrochlorothiazide (LOPRESSOR HCT) 100-25 MG per tablet Take 0.5 tablets by mouth daily.       . nortriptyline (PAMELOR) 25 MG capsule Take 25 mg by mouth at bedtime.      Marland Kitchen omeprazole (PRILOSEC) 20 MG capsule Take 20 mg by mouth daily.        Marland Kitchen oxybutynin (DITROPAN-XL) 10 MG 24 hr tablet Take 10 mg by mouth daily.          Marland Kitchen oxycodone (OXY-IR) 5 MG capsule Take 5 mg by mouth every 6 (six) hours as needed. For pain      . Valproic Acid (DEPAKENE) 250 MG/5ML SYRP syrup Take 20 mLs (1,000 mg total) by mouth 2 (two) times daily.  600 mL  3     ROS:  See HPI  Physical Exam:  Filed Vitals:   03/22/12 1203  BP: 107/69  Pulse: 90  Temp: 98.5 F (36.9 C)    Incision:  The right AKA stump has healed well with the exception of the lateral aspect.  There are 2 openings with yellow, fibrinous tissue. Extremities:  + monophasic doppler signal in the left PT/DP.  Per Dr. Arbie Cookey, the ulcer is improving and is partial thickness.   A/P:  This is a 66 y.o. female who presents s/p right AKA and ulcer on left heel.    -her ulcer on her left heel is improving.  It is partial thickness.  Continue current dressings.  Dr. Arbie Cookey stressed  the importance of the boot while in bed to prevent pressure on the wound. -her lateral portion of the right AKA stump does have some fat necrosis.  Will do once daily wet to dry dressing changes on this area.  Instructions given and daughter shown how to do the dressing changes on the days that the [facility] RN does not come to the home.  -Orders are called to [facility] for dressing instructions. -pt to f/u with Dr. Arbie Cookey in one month  Doreatha Massed, PA-C [facility] [phone]  Clinic MD:  Pt seen and evaluated together with Dr. Arbie Cookey

## 2012-04-25 ENCOUNTER — Encounter: Payer: Self-pay | Admitting: Vascular Surgery

## 2012-04-26 ENCOUNTER — Encounter: Payer: Self-pay | Admitting: Vascular Surgery

## 2012-04-26 ENCOUNTER — Ambulatory Visit (INDEPENDENT_AMBULATORY_CARE_PROVIDER_SITE_OTHER): Payer: Medicare Other | Admitting: Vascular Surgery

## 2012-04-26 VITALS — BP 125/78 | HR 76 | Resp 18 | Ht 59.0 in | Wt 175.0 lb

## 2012-04-26 DIAGNOSIS — L98499 Non-pressure chronic ulcer of skin of other sites with unspecified severity: Secondary | ICD-10-CM

## 2012-04-26 NOTE — Progress Notes (Signed)
Patient has today for followup of her right above-knee dictation on 02/01/2012 and left heel ulceration. She did have an area of fat necrosis on her most recent visit and this is healing nicely. He denies any significant pain bilaterally.  Physical exam: Right AKA completely healed aside from an area of 2 cm x 0.75 cm over the lateral aspect of her incision. This has a clean granulating base aside from one area of eschar which was debrided in the office today  Left heel is stable she does have some induration at the level of the medial malleolus but the heel itself shows no evidence of progressive tissue loss  Fascia was again instructed on the wet to dry normal saline dressing changes to her right AKA stump which is doing very nicely. Also explained the critical importance of the noncontact boot and she is in bed for the left heel. We will see her again in one month for continued followup of both these issues

## 2012-05-02 ENCOUNTER — Other Ambulatory Visit: Payer: Self-pay | Admitting: Physical Medicine & Rehabilitation

## 2012-05-20 ENCOUNTER — Encounter: Payer: Self-pay | Admitting: Vascular Surgery

## 2012-05-24 ENCOUNTER — Encounter: Payer: Self-pay | Admitting: Vascular Surgery

## 2012-05-24 ENCOUNTER — Ambulatory Visit (INDEPENDENT_AMBULATORY_CARE_PROVIDER_SITE_OTHER): Payer: Medicare Other | Admitting: Vascular Surgery

## 2012-05-24 VITALS — BP 103/67 | HR 88 | Resp 18 | Ht 59.0 in | Wt 175.0 lb

## 2012-05-24 DIAGNOSIS — L98499 Non-pressure chronic ulcer of skin of other sites with unspecified severity: Secondary | ICD-10-CM

## 2012-05-24 DIAGNOSIS — I739 Peripheral vascular disease, unspecified: Secondary | ICD-10-CM

## 2012-05-24 NOTE — Progress Notes (Signed)
The patient is here today for followup of her right above-knee amputation in May of 2013 and also for left heel ulceration. She is now at home and reports that she is able to transfer from bed to wheelchair independently.  Her right above-knee amputation is nearly completely healed she has a 1 cm area in the lateral aspect with excellent granulating base which is continuing to close. She will continue local wound care of this.  Her left foot is warm. She does have a 1-1-1/2 cm full-thickness eschar on the heel. She denies any pain associated with this.  Impression and plan: Stable healing from right above-knee amputation. In my concern regarding her left heel. She had an arteriogram while in the hospital showing that she does not have any reconstruction possibilities for her left foot. I explained that she is at high risk for amputation related to this. We will continue with local care and I will see her again in one month for continued followup. She will notify should she develop pain or erythema or fevers associated with her left heel

## 2012-06-15 ENCOUNTER — Telehealth: Payer: Self-pay

## 2012-06-15 NOTE — Telephone Encounter (Signed)
Aurea Graff from [facility] calling to get clarification on topical medication sent by Dr Riley Kill.  She is going to fax a request since after looking through the notes it does not look like patient has gotten anything new.

## 2012-06-20 ENCOUNTER — Encounter: Payer: Self-pay | Admitting: Vascular Surgery

## 2012-06-21 ENCOUNTER — Ambulatory Visit (INDEPENDENT_AMBULATORY_CARE_PROVIDER_SITE_OTHER): Payer: Medicare Other | Admitting: Vascular Surgery

## 2012-06-21 ENCOUNTER — Encounter: Payer: Self-pay | Admitting: Vascular Surgery

## 2012-06-21 VITALS — BP 105/58 | HR 75 | Resp 18 | Ht 59.0 in | Wt 180.0 lb

## 2012-06-21 DIAGNOSIS — I739 Peripheral vascular disease, unspecified: Secondary | ICD-10-CM

## 2012-06-21 NOTE — Progress Notes (Signed)
Patient presents today for followup of her right above-knee amputation and left heel ulceration. She looks quite good today and reports no pains. She is in a motorized wheelchair and is independent in this. She denies any fevers or systemic problems.  Past Medical History  Diagnosis Date  . Ulcer   . Hypertension   . Heart murmur   . Gout   . PVD (peripheral vascular disease)   . Pain in limb   . CHF (congestive heart failure)   . 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 Conus medullaris syndrome   . Arthritis   . Breast cancer ~ 1975    right  . Carpal tunnel syndrome of right wrist   . High cholesterol   . Angina   . Myocardial infarction 1996  . Pneumonia ~ 2002; ~1975  . Type II diabetes mellitus   . Exertional dyspnea   . Hypothyroidism   . Seizures 02/02/12    "used to; a long time ago"  . Stroke 10/2008    "spinal cord"  . Neurogenic bladder   . Irregular heartbeat     History  Substance Use Topics  . Smoking status: Current Every Day Smoker -- 0.2 packs/day for 25 years    Types: Cigarettes  . Smokeless tobacco: Never Used  . Alcohol Use: No     02/02/12 "last alcohol ~ 2008"    Family History  Problem Relation Age of Onset  . Arthritis Mother   . Hypertension Mother   . Heart disease Mother     before age 66  . Hyperlipidemia Mother   . Other Mother     varicose veins  . Heart attack Mother   . Heart disease Father   . Diabetes Father   . Hypertension Sister   . Hyperlipidemia Sister   . Other Sister     varicose veins  . Hypertension Son   . Heart attack Son   . Diabetes Brother   . Heart disease Brother   . Hypertension Brother   . Diabetes Daughter   . Hyperlipidemia Daughter   . Heart disease Daughter     before age 64  . Other Daughter     varicose veins    No Known Allergies  Current outpatient prescriptions:allopurinol (ZYLOPRIM) 100 MG tablet, Take 100 mg by mouth daily.  , Disp: , Rfl: ;  aspirin EC 81 MG tablet, Take 81 mg by  mouth daily., Disp: , Rfl: ;  atorvastatin (LIPITOR) 10 MG tablet, Take 10 mg by mouth daily., Disp: , Rfl: ;  baclofen (LIORESAL) 10 MG tablet, Take 10 mg by mouth 2 (two) times daily. , Disp: , Rfl:  colchicine 0.6 MG tablet, Take 0.6 mg by mouth every 12 (twelve) hours as needed. For gout., Disp: , Rfl: ;  diclofenac sodium (VOLTAREN) 1 % GEL, Apply 1 application topically 3 (three) times daily as needed. For pain, Disp: , Rfl: ;  gabapentin (NEURONTIN) 300 MG capsule, TAKE ONE CAPSULE BY MOUTH 4 TIMES DAILY, Disp: 120 capsule, Rfl: 3 insulin aspart protamine-insulin aspart (NOVOLOG 70/30) (70-30) 100 UNIT/ML injection, Inject 12 Units into the skin as directed. Sliding scale. 12 units if blood sugar is 150, 16 units if sugar is 200 and above, Disp: , Rfl: ;  levothyroxine (SYNTHROID, LEVOTHROID) 75 MCG tablet, Take 75 mcg by mouth daily.  , Disp: , Rfl: ;  methocarbamol (ROBAXIN) 500 MG tablet, Take 500 mg by mouth 4 (four) times daily.  ,  Disp: , Rfl:  metoprolol-hydrochlorothiazide (LOPRESSOR HCT) 100-25 MG per tablet, Take 0.5 tablets by mouth daily. , Disp: , Rfl: ;  nortriptyline (PAMELOR) 25 MG capsule, Take 25 mg by mouth at bedtime., Disp: , Rfl: ;  nortriptyline (PAMELOR) 25 MG capsule, TAKE ONE CAPSULE BY MOUTH AT BEDTIME, Disp: 30 capsule, Rfl: 2;  omeprazole (PRILOSEC) 20 MG capsule, Take 20 mg by mouth daily.  , Disp: , Rfl:  oxybutynin (DITROPAN-XL) 10 MG 24 hr tablet, Take 10 mg by mouth daily.  , Disp: , Rfl: ;  oxycodone (OXY-IR) 5 MG capsule, Take 5 mg by mouth every 6 (six) hours as needed. For pain, Disp: , Rfl: ;  Valproic Acid (DEPAKENE) 250 MG/5ML SYRP syrup, Take 20 mLs (1,000 mg total) by mouth 2 (two) times daily., Disp: 600 mL, Rfl: 3;  amLODipine (NORVASC) 5 MG tablet, Take 5 mg by mouth daily.  , Disp: , Rfl:   BP 105/58  Pulse 75  Resp 18  Ht 4\' 11"  (1.499 m)  Wt 180 lb (81.647 kg)  BMI 36.36 kg/m2  Body mass index is 36.36 kg/(m^2).       Physical exam a  well-developed female in no acute distress sitting in a wheelchair. Her right above-knee amputation is completely healed. There was a small area in the mid to lateral aspect that had the open area which now is completely granulated.  Her left heel is been a major concern. This looks quite good today with a very superficial area of skin loss. This is above the level of the heel over Achilles tendon and is much more superficial than it has been in the past. The eschar that was present has been separated.  Impression and plan healing of her left heel. I again discussed this at length with the patient and her husband. 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 I feel that she be a lifelong risk for amputation with unreconstructable distal tibial disease. Fortunately this is continuing to make slow progress. She again discussed the option for a right above-knee versus below-knee amputation. I explained to her again that her level of the necrosis and tissue loss with such the tissue had no chance for healing a below-knee amputation. I again explained that there is a high marginal possibility for healing that we would proceed with below-knee amputation knowing that it may come to above-knee amputation and time. In her case she had extensive fat necrosis up to the level of the below-knee amputation I explained that she would have no chance for healing below knee at that time. As it turns out she did have some difficulty in healing above-knee amputation therefore she has gone on to complete healing. We will see her again in 2 months for continued followup

## 2012-06-22 ENCOUNTER — Telehealth: Payer: Self-pay

## 2012-06-22 NOTE — Telephone Encounter (Signed)
Alexandra Wiley with [facility] calling to get clarification on a medication.  Advised her that patient has not been seen and needs to make appointment to authorize any refills.

## 2012-07-24 ENCOUNTER — Other Ambulatory Visit: Payer: Self-pay | Admitting: Physical Medicine & Rehabilitation

## 2012-08-05 ENCOUNTER — Other Ambulatory Visit: Payer: Self-pay | Admitting: Physical Medicine & Rehabilitation

## 2012-08-16 ENCOUNTER — Ambulatory Visit (INDEPENDENT_AMBULATORY_CARE_PROVIDER_SITE_OTHER): Payer: Medicare Other | Admitting: Neurosurgery

## 2012-08-16 ENCOUNTER — Encounter: Payer: Self-pay | Admitting: Neurosurgery

## 2012-08-16 VITALS — BP 102/68 | HR 80 | Resp 16 | Ht 59.0 in | Wt 180.0 lb

## 2012-08-16 DIAGNOSIS — I70219 Atherosclerosis of native arteries of extremities with intermittent claudication, unspecified extremity: Secondary | ICD-10-CM

## 2012-08-16 DIAGNOSIS — L97409 Non-pressure chronic ulcer of unspecified heel and midfoot with unspecified severity: Secondary | ICD-10-CM | POA: Insufficient documentation

## 2012-08-16 DIAGNOSIS — I739 Peripheral vascular disease, unspecified: Secondary | ICD-10-CM

## 2012-08-16 NOTE — Progress Notes (Signed)
Subjective:     Patient ID: Alexandra Wiley, female   DOB: 04-20-1946, 66 y.o.   MRN: [id number]  HPI: 66 year old female patient of Dr. Arbie Cookey who is status post right above-the-knee amputation for extensive nonhealing wound. The patient saw her podiatrist today who sent her back to [place] for evaluation of a left heel ulceration which Dr. Arbie Cookey was aware of. The patient states she has no pain due to a stroke.   Review of Systems: 12 point review of systems is notable for the difficulties described above otherwise unremarkable     Objective:   Physical Exam: Afebrile, vital signs are stable, she does have a large ulcerated heel on the left lower extremity that has enlarged since Dr. Arbie Cookey saw her last a month ago. Again her sensation is extremely diminished due to blood flow as well as a history of stroke. There is visible bone in necrotic tissue and around the heel as well as having some necrosis and her toes on the left.     Assessment:     Assessment as above    Plan:     Dr. Arbie Cookey spoke with the patient and her husband at length about a left above-the-knee amputation. The patient understands she has no option but she does not have to proceed at this time. The patient return in 3-4 weeks to see Dr. Arbie Cookey  to discuss the surgery. Dr. Arbie Cookey encouraged and answered their questions.  Lauree Chandler ANP  Clinic M.D. [place]

## 2012-08-23 ENCOUNTER — Ambulatory Visit: Payer: Medicare Other | Admitting: Vascular Surgery

## 2012-08-25 ENCOUNTER — Telehealth: Payer: Self-pay

## 2012-08-25 NOTE — Telephone Encounter (Signed)
Called patient to get clarification on what medication she needed.  When called she says she has what she needs.

## 2012-08-25 NOTE — Telephone Encounter (Signed)
Patient called for refills but did not specify which ones.

## 2012-08-26 ENCOUNTER — Encounter (HOSPITAL_BASED_OUTPATIENT_CLINIC_OR_DEPARTMENT_OTHER): Payer: Medicare Other | Attending: General Surgery

## 2012-08-26 DIAGNOSIS — E1159 Type 2 diabetes mellitus with other circulatory complications: Secondary | ICD-10-CM | POA: Insufficient documentation

## 2012-08-26 DIAGNOSIS — E1169 Type 2 diabetes mellitus with other specified complication: Secondary | ICD-10-CM | POA: Insufficient documentation

## 2012-08-26 DIAGNOSIS — I96 Gangrene, not elsewhere classified: Secondary | ICD-10-CM | POA: Insufficient documentation

## 2012-08-26 DIAGNOSIS — L97409 Non-pressure chronic ulcer of unspecified heel and midfoot with unspecified severity: Secondary | ICD-10-CM | POA: Insufficient documentation

## 2012-08-27 NOTE — Progress Notes (Signed)
[facility]  NAME:  [name], [name]             ACCOUNT NO.:  [id number]  MEDICAL RECORD NO.:  [id number]      DATE OF BIRTH:  Sep 02, 1946  PHYSICIAN:  Ardath Sax, M.D.           VISIT DATE:                                  OFFICE VISIT   Alexandra Wiley is a diabetic lady, 66 years of age, who comes to [place] with a severe diabetic foot ulcer on the left heel.  It is a Educational [employer] 4, it is infected, it has got necrotic tissue right down to the bone.  I debrided of all the foul smelling gangrenous tissue.  She has a temperature of 98, pulse 74, respirations 18, blood pressure 115/70.  She is here with her husband and we talked for a while about the problems with this wound and I told the husband that amputation may be necessary.  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  She has many medical problems including hypertension, angina, gout, peripheral vascular disease, congestive heart failure.  She has got COPD.  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  She has got a neurogenic bladder and has a Foley in all the time.  She also has a history of breast cancer, but apparently has been cured of that.  This lady has many issues and she is being followed by an orthopedist also. Today after debriding her, I am going to treat her with Santyl and we put her on doxycycline.  She will come back here in a week and we will just go from there.     Ardath Sax, M.D.     PP/MEDQ  D:  08/26/2012  T:  08/27/2012  Job:  [id number]

## 2012-09-05 ENCOUNTER — Encounter: Payer: Self-pay | Admitting: Vascular Surgery

## 2012-09-06 ENCOUNTER — Encounter: Payer: Self-pay | Admitting: Vascular Surgery

## 2012-09-06 ENCOUNTER — Ambulatory Visit (INDEPENDENT_AMBULATORY_CARE_PROVIDER_SITE_OTHER): Payer: Medicare Other | Admitting: Vascular Surgery

## 2012-09-06 VITALS — BP 109/58 | HR 80 | Ht 59.0 in | Wt 180.0 lb

## 2012-09-06 DIAGNOSIS — L98499 Non-pressure chronic ulcer of skin of other sites with unspecified severity: Secondary | ICD-10-CM

## 2012-09-06 DIAGNOSIS — I739 Peripheral vascular disease, unspecified: Secondary | ICD-10-CM

## 2012-09-06 NOTE — Progress Notes (Signed)
Patient has today for can followup of her left heel ulcer and right above-knee amputation. Her right amputation is well healed is status post this in May of 2013. 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 She is currently being seen at the [facility] wound center with debridement and topical antibiotic application. She has a minimal sensation in her foot and has no pain associated with this. She has also been compliant with a noncontact good. She gets around in a motorized wheelchair.  Past Medical History  Diagnosis Date  . Ulcer   . Hypertension   . Heart murmur   . Gout   . PVD (peripheral vascular disease)   . Pain in limb   . CHF (congestive heart failure)   . 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 Conus medullaris syndrome   . Arthritis   . Breast cancer ~ 1975    right  . Carpal tunnel syndrome of right wrist   . High cholesterol   . Angina   . Myocardial infarction 1996  . Pneumonia ~ 2002; ~1975  . Type II diabetes mellitus   . Exertional dyspnea   . Hypothyroidism   . Seizures 02/02/12    "used to; a long time ago"  . Stroke 10/2008    "spinal cord"  . Neurogenic bladder   . Irregular heartbeat     History  Substance Use Topics  . Smoking status: Current Every Day Smoker -- 0.2 packs/day for 25 years  . Smokeless tobacco: Never Used     Comment: pt states that she is not intrested in support group because she would not be able to get there  . Alcohol Use: No     Comment: 02/02/12 "last alcohol ~ 2008"    Family History  Problem Relation Age of Onset  . Arthritis Mother   . Hypertension Mother   . Heart disease Mother     before age 79  . Hyperlipidemia Mother   . Other Mother     varicose veins  . Heart attack Mother   . Heart disease Father   . Diabetes Father   . Hypertension Sister   . Hyperlipidemia Sister   . Other Sister     varicose veins  . Hypertension Son   . Heart attack Son   .  Diabetes Brother   . Heart disease Brother   . Hypertension Brother   . Diabetes Daughter   . Hyperlipidemia Daughter   . Heart disease Daughter     before age 74  . Other Daughter     varicose veins    No Known Allergies  Current outpatient prescriptions:allopurinol (ZYLOPRIM) 100 MG tablet, Take 100 mg by mouth daily.  , Disp: , Rfl: ;  amLODipine (NORVASC) 5 MG tablet, Take 5 mg by mouth daily.  , Disp: , Rfl: ;  aspirin EC 81 MG tablet, Take 81 mg by mouth daily., Disp: , Rfl: ;  atorvastatin (LIPITOR) 10 MG tablet, Take 10 mg by mouth daily., Disp: , Rfl: ;  baclofen (LIORESAL) 10 MG tablet, Take 10 mg by mouth 2 (two) times daily. , Disp: , Rfl:  colchicine 0.6 MG tablet, Take 0.6 mg by mouth every 12 (twelve) hours as needed. For gout., Disp: , Rfl: ;  gabapentin (NEURONTIN) 300 MG capsule, TAKE ONE CAPSULE BY MOUTH 4 TIMES DAILY, Disp: 120 capsule, Rfl: 3;  insulin aspart protamine-insulin aspart (NOVOLOG 70/30) (70-30)  100 UNIT/ML injection, Inject 12 Units into the skin as directed. Sliding scale. 12 units if blood sugar is 150, 16 units if sugar is 200 and above, Disp: , Rfl:  levothyroxine (SYNTHROID, LEVOTHROID) 75 MCG tablet, Take 75 mcg by mouth daily.  , Disp: , Rfl: ;  methocarbamol (ROBAXIN) 500 MG tablet, Take 500 mg by mouth daily. , Disp: , Rfl: ;  metoprolol-hydrochlorothiazide (LOPRESSOR HCT) 100-25 MG per tablet, Take 0.5 tablets by mouth daily. , Disp: , Rfl: ;  nortriptyline (PAMELOR) 25 MG capsule, Take 25 mg by mouth at bedtime., Disp: , Rfl:  nortriptyline (PAMELOR) 25 MG capsule, TAKE ONE CAPSULE BY MOUTH AT BEDTIME, Disp: 30 capsule, Rfl: 2;  omeprazole (PRILOSEC) 20 MG capsule, Take 20 mg by mouth daily.  , Disp: , Rfl: ;  oxybutynin (DITROPAN-XL) 10 MG 24 hr tablet, Take 10 mg by mouth daily.  , Disp: , Rfl: ;  oxycodone (OXY-IR) 5 MG capsule, Take 5 mg by mouth every 6 (six) hours as needed. For pain, Disp: , Rfl:  Valproic Acid (DEPAKENE) 250 MG/5ML SYRP syrup,  Take 20 mLs (1,000 mg total) by mouth 2 (two) times daily., Disp: 600 mL, Rfl: 3;  VOLTAREN 1 % GEL, APPLY TOPICALLY THREE TIMES DAILY TO AFFECTED AREA, Disp: 300 g, Rfl: 2  BP 109/58  Pulse 80  Ht 4\' 11"  (1.499 m)  Wt 180 lb (81.647 kg)  BMI 36.36 kg/m2  SpO2 100%  Body mass index is 36.36 kg/(m^2).       On physical exam she does have less swelling in her left foot than typical. She does have a very large wound over her entire heel. This does not appear to extend to bone. She does have a better granulating base and in the past.  I again discussed with the patient and her husband present the difficulty in healing this level of ulceration with limited flow and the difficulty with non-reconstructable disease. She has questions as to whether or not this could have been related to old trauma from 10 years ago. I explained that with her arteriogram showing bilateral small vessel disease at this would be extremely unlikely. She also is requesting alternative treatments which I am unfamiliar with. I explained that with the severe small vessel disease is very limited options for improvement in flow. She will continue her treatment at the wound center and see me again in 2 months

## 2012-09-23 ENCOUNTER — Encounter (HOSPITAL_BASED_OUTPATIENT_CLINIC_OR_DEPARTMENT_OTHER): Payer: Medicare Other

## 2012-09-23 ENCOUNTER — Encounter (HOSPITAL_BASED_OUTPATIENT_CLINIC_OR_DEPARTMENT_OTHER): Payer: Medicare Other | Attending: General Surgery

## 2012-09-23 DIAGNOSIS — I96 Gangrene, not elsewhere classified: Secondary | ICD-10-CM | POA: Insufficient documentation

## 2012-09-23 DIAGNOSIS — L97509 Non-pressure chronic ulcer of other part of unspecified foot with unspecified severity: Secondary | ICD-10-CM | POA: Insufficient documentation

## 2012-09-23 DIAGNOSIS — E1169 Type 2 diabetes mellitus with other specified complication: Secondary | ICD-10-CM | POA: Insufficient documentation

## 2012-09-23 DIAGNOSIS — E1159 Type 2 diabetes mellitus with other circulatory complications: Secondary | ICD-10-CM | POA: Insufficient documentation

## 2012-09-23 DIAGNOSIS — L97409 Non-pressure chronic ulcer of unspecified heel and midfoot with unspecified severity: Secondary | ICD-10-CM | POA: Insufficient documentation

## 2012-09-28 ENCOUNTER — Encounter: Payer: Self-pay | Admitting: Physical Medicine & Rehabilitation

## 2012-09-28 ENCOUNTER — Encounter: Payer: Medicare Other | Attending: Physical Medicine & Rehabilitation | Admitting: Physical Medicine & Rehabilitation

## 2012-09-28 ENCOUNTER — Telehealth: Payer: Self-pay | Admitting: Vascular Surgery

## 2012-09-28 VITALS — BP 122/79 | HR 87 | Resp 14 | Ht 59.0 in | Wt 179.0 lb

## 2012-09-28 DIAGNOSIS — S78119A Complete traumatic amputation at level between unspecified hip and knee, initial encounter: Secondary | ICD-10-CM | POA: Insufficient documentation

## 2012-09-28 DIAGNOSIS — M799 Soft tissue disorder, unspecified: Secondary | ICD-10-CM | POA: Insufficient documentation

## 2012-09-28 DIAGNOSIS — I739 Peripheral vascular disease, unspecified: Secondary | ICD-10-CM

## 2012-09-28 DIAGNOSIS — G8220 Paraplegia, unspecified: Secondary | ICD-10-CM | POA: Insufficient documentation

## 2012-09-28 DIAGNOSIS — Z89619 Acquired absence of unspecified leg above knee: Secondary | ICD-10-CM | POA: Insufficient documentation

## 2012-09-28 DIAGNOSIS — M109 Gout, unspecified: Secondary | ICD-10-CM | POA: Insufficient documentation

## 2012-09-28 DIAGNOSIS — G9589 Other specified diseases of spinal cord: Secondary | ICD-10-CM | POA: Insufficient documentation

## 2012-09-28 DIAGNOSIS — K592 Neurogenic bowel, not elsewhere classified: Secondary | ICD-10-CM

## 2012-09-28 DIAGNOSIS — L97409 Non-pressure chronic ulcer of unspecified heel and midfoot with unspecified severity: Secondary | ICD-10-CM

## 2012-09-28 DIAGNOSIS — G959 Disease of spinal cord, unspecified: Secondary | ICD-10-CM

## 2012-09-28 DIAGNOSIS — G9581 Conus medullaris syndrome: Secondary | ICD-10-CM

## 2012-09-28 DIAGNOSIS — M1710 Unilateral primary osteoarthritis, unspecified knee: Secondary | ICD-10-CM

## 2012-09-28 DIAGNOSIS — N319 Neuromuscular dysfunction of bladder, unspecified: Secondary | ICD-10-CM

## 2012-09-28 MED ORDER — OXYCODONE HCL 5 MG PO CAPS: 5.0000 mg | ORAL_CAPSULE | Freq: Four times a day (QID) | ORAL | Status: DC | PRN

## 2012-09-28 MED ORDER — DICLOFENAC SODIUM 1 % TD GEL: 2.0000 g | Freq: Four times a day (QID) | TRANSDERMAL | Status: DC

## 2012-09-28 NOTE — Patient Instructions (Signed)
PLEASE FOLLOW UP WITH THE WOUND CARE CLINIC AS SCHEDULED ON FRIDAY

## 2012-09-28 NOTE — Telephone Encounter (Addendum)
Message copied by Shari Prows on Wed Sep 28, 2012  2:19 PM ------      Message from: Phillips Odor      Created: Wed Sep 28, 2012 12:15 PM      Regarding: needs appt. moved to earlier date       [facility] called to request this pt's f/u appt. be made sooner than the recommended 2 mo f/u that was scheduled at last office visit.  Can we schedule her to see Dr. Arbie Cookey 10/11/12?  The Physical Medicine Dept. Is requesting  This due to worsening left heel wound. (is also under care of Wound Center)  I scheduled an appt for the above patient on 10/11/12 at 3:45pm. The patient is aware of the appointment and will try to arrange her transportation. I also mailed her an appt letter.awt

## 2012-09-28 NOTE — Progress Notes (Signed)
Subjective:    Patient ID: Alexandra Wiley, female    DOB: [birth year], 67 y.o.   MRN: [id number]  HPI  [name] is back regarding her multiple issues today. Since I last saw her in 2/13 she developed wounds and insuffiency in the right lower ext requiring a right AKA which was performed by Dr. Arbie Cookey. She has also had some problems with wounds on the left lower extremity. This has been followed by Dr. Arbie Cookey who I believe is recommending amputation of the left leg. She is also seeing the wound care clinic over at [facility]. She is currently on abx for wound.   Her left knee gives her the most pain. She has been out of her oxycodone for 2 weeks and her voltaren gel for some time.  She is using a powered wheelchair for mobility currently.  Pain Inventory Average Pain 8 Pain Right Now 6 My pain is sharp, burning and tingling  In the last 24 hours, has pain interfered with the following? General activity 6 Relation with others 6 Enjoyment of life 6 What TIME of day is your pain at its worst? daytime and evening Sleep (in general) Fair  Pain is worse with: unsure Pain improves with: medication Relief from Meds: 6  Mobility ability to climb steps?  no do you drive?  no use a wheelchair transfers alone  Function retired  Neuro/Psych bladder control problems numbness trouble walking spasms  Prior Studies Any changes since last visit?  no  Physicians involved in your care Any changes since last visit?  no   Family History  Problem Relation Age of Onset  . Arthritis Mother   . Hypertension Mother   . Heart disease Mother     before age 39  . Hyperlipidemia Mother   . Other Mother     varicose veins  . Heart attack Mother   . Heart disease Father   . Diabetes Father   . Hypertension Sister   . Hyperlipidemia Sister   . Other Sister     varicose veins  . Hypertension Son   . Heart attack Son   . Diabetes Brother   . Heart disease Brother   . Hypertension Brother     . Diabetes Daughter   . Hyperlipidemia Daughter   . Heart disease Daughter     before age 2  . Other Daughter     varicose veins   History   Social History  . Marital Status: Married    Spouse Name: N/A    Number of Children: N/A  . Years of Education: N/A   Social History Main Topics  . Smoking status: Current Every Day Smoker -- 0.2 packs/day for 25 years  . Smokeless tobacco: Never Used     Comment: pt states that she is not intrested in support group because she would not be able to get there  . Alcohol Use: No     Comment: 02/02/12 "last alcohol ~ 2008"  . Drug Use: No  . Sexually Active: No   Other Topics Concern  . None   Social History Narrative  . None   Past Surgical History  Procedure Date  . Foot tendon surgery 2000    Left foot  . Tubal ligation   . Femur fracture surgery 1960's    Left leg, repair of non-union femur fracture by Dr. Orland Jarred  . Breast lumpectomy ~1975    Right breast carcinoma in situ  . Tonsillectomy and adenoidectomy ~ 1961  .  Dilation and curettage of uterus   . Fracture surgery   . Amputation 02/05/2012    Procedure: AMPUTATION BELOW KNEE;  Surgeon: Larina Earthly, MD;  Location: [facility] OR;  Service: Vascular;  Laterality: Right;  right  above knee amputation  . Right leg amputation 01/2012   Past Medical History  Diagnosis Date  . Ulcer   . Hypertension   . Heart murmur   . Gout   . PVD (peripheral vascular disease)   . Pain in limb   . CHF (congestive heart failure)   . 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 Conus medullaris syndrome   . Arthritis   . Breast cancer ~ 1975    right  . Carpal tunnel syndrome of right wrist   . High cholesterol   . Angina   . Myocardial infarction 1996  . Pneumonia ~ 2002; ~1975  . Type II diabetes mellitus   . Exertional dyspnea   . Hypothyroidism   . Seizures 02/02/12    "used to; a long time ago"  . Stroke 10/2008    "spinal cord"  . Neurogenic bladder   . Irregular heartbeat     BP 122/79  Pulse 87  Resp 14  Ht 4\' 11"  (1.499 m)  Wt 179 lb (81.194 kg)  BMI 36.15 kg/m2    Review of Systems  Musculoskeletal: Positive for myalgias, arthralgias and gait problem.  Skin: Positive for rash.  Neurological: Positive for numbness.       Spasms   All other systems reviewed and are negative.       Objective:   Physical Exam   General: Alert and oriented x 3, No apparent distress HEENT: Head is normocephalic, atraumatic, PERRLA, EOMI, sclera anicteric, oral mucosa pink and moist, dentition intact, ext ear canals clear,  Neck: Supple without JVD or lymphadenopathy Heart: Reg rate and rhythm. No murmurs rubs or gallops Chest: CTA bilaterally without wheezes, rales, or rhonchi; no distress Abdomen: Soft, non-tender, non-distended, bowel sounds positive. Extremities: No clubbing, cyanosis, or edema. Pulses are 2+ Skin: Right AKA site intact. LLE notable for. Right aka is well healed. The left foot is notable for a 3" by 1.5 inch area of granulation tissue and surounding slough. There are other areas around the medial foot and arch which appear necrotic and non-viable. These areas were quite boggy. Area is non tender.  Neuro: pt is alert and oriented. Sensory deficits below the waist.   Musculoskeletal:  She has tenderness and valgus deformity at the left knee. Psych: Pt's affect is appropriate. Pt is cooperative        Assessment & Plan:  1. Conus medullaris syndrome with paraplegia  2. Neurogenic bowel and bladder  3. Osteoarthritis left knee  4. History of gout with acute flare affecting left knee today. 5. Right AKA 6. Necrotic left foot   Plan: 1. Left foot was redressed. This needs prompt follow up at the wound care clinic. She is seeing Dr. Arbie Cookey also. It's my opinion that she would be better off with an amputation, as I don't believe this wound is salvageable. I redressed her wound with a wet to dry dressing 2. For pain, refilled oxycodone 5mg   #60, and voltaren gel which she will use on her left knee 3. I will see her back in 3 months time.

## 2012-09-29 ENCOUNTER — Telehealth: Payer: Self-pay | Admitting: *Deleted

## 2012-09-29 ENCOUNTER — Telehealth: Payer: Self-pay

## 2012-09-29 NOTE — Telephone Encounter (Signed)
Patients voltaren request has been approved. Patient is aware.

## 2012-09-29 NOTE — Telephone Encounter (Signed)
Annette at Dr Lacinda Axon office vascular and vein called to let us know that patient has an appointment 10/11/2012.  Patient is aware.

## 2012-10-10 ENCOUNTER — Encounter: Payer: Self-pay | Admitting: Vascular Surgery

## 2012-10-11 ENCOUNTER — Ambulatory Visit: Payer: Medicare Other | Admitting: Vascular Surgery

## 2012-10-14 ENCOUNTER — Ambulatory Visit (HOSPITAL_COMMUNITY)
Admission: RE | Admit: 2012-10-14 | Discharge: 2012-10-14 | Disposition: A | Discharge status: Home / Self Care | Payer: Medicare Other | Source: Ambulatory Visit | Attending: Cardiovascular Disease | Admitting: Cardiovascular Disease

## 2012-10-14 ENCOUNTER — Other Ambulatory Visit (HOSPITAL_COMMUNITY): Payer: Self-pay | Admitting: Cardiovascular Disease

## 2012-10-14 DIAGNOSIS — T07XXXA Unspecified multiple injuries, initial encounter: Secondary | ICD-10-CM | POA: Insufficient documentation

## 2012-10-14 DIAGNOSIS — X58XXXA Exposure to other specified factors, initial encounter: Secondary | ICD-10-CM | POA: Insufficient documentation

## 2012-10-14 DIAGNOSIS — T148XXA Other injury of unspecified body region, initial encounter: Secondary | ICD-10-CM

## 2012-10-14 NOTE — Progress Notes (Signed)
Left lower extremity arterial Duplex completed. Alexandra Wiley

## 2012-10-17 ENCOUNTER — Encounter: Payer: Self-pay | Admitting: Vascular Surgery

## 2012-10-18 ENCOUNTER — Ambulatory Visit (INDEPENDENT_AMBULATORY_CARE_PROVIDER_SITE_OTHER): Payer: Medicare Other | Admitting: Vascular Surgery

## 2012-10-18 ENCOUNTER — Encounter: Payer: Self-pay | Admitting: Vascular Surgery

## 2012-10-18 VITALS — BP 152/82 | HR 106 | Temp 98.5°F | Ht 59.0 in | Wt 179.0 lb

## 2012-10-18 DIAGNOSIS — I739 Peripheral vascular disease, unspecified: Secondary | ICD-10-CM

## 2012-10-18 DIAGNOSIS — L98499 Non-pressure chronic ulcer of skin of other sites with unspecified severity: Secondary | ICD-10-CM

## 2012-10-18 NOTE — Progress Notes (Signed)
The patient presents today for continued followup of her left heel ulcer. She is to continue to be seen in the wound center. She saw Dr. Allyson Sabal for a second opinion regarding her lower extremity flow. He does not have any pain.  Past Medical History  Diagnosis Date  . Ulcer   . Hypertension   . Heart murmur   . Gout   . PVD (peripheral vascular disease)   . Pain in limb   . CHF (congestive heart failure)   . 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 Conus medullaris syndrome   . Arthritis   . Breast cancer ~ 1975    right  . Carpal tunnel syndrome of right wrist   . High cholesterol   . Angina   . Myocardial infarction 1996  . Pneumonia ~ 2002; ~1975  . Type II diabetes mellitus   . Exertional dyspnea   . Hypothyroidism   . Seizures 02/02/12    "used to; a long time ago"  . Stroke 10/2008    "spinal cord"  . Neurogenic bladder   . Irregular heartbeat     History  Substance Use Topics  . Smoking status: Current Every Day Smoker -- 0.5 packs/day for 25 years  . Smokeless tobacco: Never Used     Comment: pt states that she is not intrested in support group because she would not be able to get there  . Alcohol Use: No     Comment: 02/02/12 "last alcohol ~ 2008"    Family History  Problem Relation Age of Onset  . Arthritis Mother   . Hypertension Mother   . Heart disease Mother     before age 35  . Hyperlipidemia Mother   . Other Mother     varicose veins  . Heart attack Mother   . Heart disease Father   . Diabetes Father   . Hypertension Sister   . Hyperlipidemia Sister   . Other Sister     varicose veins  . Hypertension Son   . Heart attack Son   . Diabetes Brother   . Heart disease Brother   . Hypertension Brother   . Diabetes Daughter   . Hyperlipidemia Daughter   . Heart disease Daughter     before age 102  . Other Daughter     varicose veins    No Known Allergies  Current outpatient prescriptions:allopurinol (ZYLOPRIM) 100 MG tablet, Take 100 mg  by mouth daily.  , Disp: , Rfl: ;  amLODipine (NORVASC) 5 MG tablet, Take 5 mg by mouth daily.  , Disp: , Rfl: ;  atorvastatin (LIPITOR) 10 MG tablet, Take 10 mg by mouth daily., Disp: , Rfl: ;  baclofen (LIORESAL) 10 MG tablet, Take 10 mg by mouth 2 (two) times daily. , Disp: , Rfl:  colchicine 0.6 MG tablet, Take 0.6 mg by mouth every 12 (twelve) hours as needed. For gout., Disp: , Rfl: ;  diclofenac sodium (VOLTAREN) 1 % GEL, Apply 2 g topically 4 (four) times daily. To left knee, Disp: 300 g, Rfl: 5;  gabapentin (NEURONTIN) 300 MG capsule, TAKE ONE CAPSULE BY MOUTH 4 TIMES DAILY, Disp: 120 capsule, Rfl: 3 insulin aspart protamine-insulin aspart (NOVOLOG 70/30) (70-30) 100 UNIT/ML injection, Inject 12 Units into the skin as directed. Sliding scale. 10 units if blood sugar is 125, 12 units if blood sugar is 150, 16 units if sugar is 200 and above, Disp: , Rfl: ;  levothyroxine (SYNTHROID, LEVOTHROID) 75 MCG  tablet, Take 75 mcg by mouth daily.  , Disp: , Rfl: ;  methocarbamol (ROBAXIN) 500 MG tablet, Take 500 mg by mouth daily. , Disp: , Rfl:  metoprolol-hydrochlorothiazide (LOPRESSOR HCT) 100-25 MG per tablet, Take 0.5 tablets by mouth daily. , Disp: , Rfl: ;  nortriptyline (PAMELOR) 25 MG capsule, Take 25 mg by mouth at bedtime., Disp: , Rfl: ;  nortriptyline (PAMELOR) 25 MG capsule, TAKE ONE CAPSULE BY MOUTH AT BEDTIME, Disp: 30 capsule, Rfl: 2;  omeprazole (PRILOSEC) 20 MG capsule, Take 20 mg by mouth daily.  , Disp: , Rfl:  oxybutynin (DITROPAN-XL) 10 MG 24 hr tablet, Take 10 mg by mouth daily.  , Disp: , Rfl: ;  oxycodone (OXY-IR) 5 MG capsule, Take 1 capsule (5 mg total) by mouth every 6 (six) hours as needed. For pain, Disp: 60 capsule, Rfl: 0;  Valproic Acid (DEPAKENE) 250 MG/5ML SYRP syrup, Take 20 mLs (1,000 mg total) by mouth 2 (two) times daily., Disp: 600 mL, Rfl: 3  BP 152/82  Pulse 106  Temp 98.5 F (36.9 C) (Oral)  Ht 4\' 11"  (1.499 m)  Wt 179 lb (81.194 kg)  BMI 36.15 kg/m2  SpO2  100%  Body mass index is 36.15 kg/(m^2).       Physical exam in her she does have a very large ulcer on her heel. This actually has complete resolution of the eschar present and she has a very good granulating base and this does not go down to bone. She also has ulceration over her tip of her great toe.  I again had a very long discussion with the patient and her husband present. She did have a spinal cord stroke leaving her with severe lower extremity weakness neurogenic bladder and difficulty with her bowel function. She does not have any ability to stand and put any weight on her left leg. I re\re reviewed her prior arteriogram from May of 2013. This was primarily done for her right foot ischemia. At this time it does show patency with some mild irregularity throughout her distal superficial artery. She had patency of her popliteal and tibial vessels down to the level of her foot. I do not palpate a popliteal pulse currently so she may have had interval occlusion of her superficial femoral artery.  She does have a baseline creatinine of 2.3 at our last labs in the system. This was from June of 2013. I explained that with the no function of her left leg due to the spinal cord stroke that major surgical bypass for attempted limb salvage would not be appropriate. Explain the significant risk of her sling renal function with arteriography. She is somewhat unrealistic in asking about walking with a above-knee prosthesis on the right. I again explained that since she has no motor function on the left at this would not be possible. Currently her quality of life would not change with the amputation on the left since she is completely from bed the motorized wheelchair. Since she is having had continued success with local wound care would recommend discontinue. We will see her again in one month for continued discussion

## 2012-10-19 ENCOUNTER — Other Ambulatory Visit: Payer: Self-pay | Admitting: *Deleted

## 2012-10-19 DIAGNOSIS — Z48812 Encounter for surgical aftercare following surgery on the circulatory system: Secondary | ICD-10-CM

## 2012-10-19 DIAGNOSIS — I739 Peripheral vascular disease, unspecified: Secondary | ICD-10-CM

## 2012-10-28 ENCOUNTER — Encounter (HOSPITAL_BASED_OUTPATIENT_CLINIC_OR_DEPARTMENT_OTHER): Payer: Medicare Other | Attending: General Surgery

## 2012-10-28 DIAGNOSIS — L97509 Non-pressure chronic ulcer of other part of unspecified foot with unspecified severity: Secondary | ICD-10-CM | POA: Insufficient documentation

## 2012-10-28 DIAGNOSIS — L97409 Non-pressure chronic ulcer of unspecified heel and midfoot with unspecified severity: Secondary | ICD-10-CM | POA: Insufficient documentation

## 2012-10-28 DIAGNOSIS — E1069 Type 1 diabetes mellitus with other specified complication: Secondary | ICD-10-CM | POA: Insufficient documentation

## 2012-11-01 ENCOUNTER — Other Ambulatory Visit: Payer: Self-pay | Admitting: Physical Medicine & Rehabilitation

## 2012-11-02 NOTE — Telephone Encounter (Signed)
Colcrys refill request.  Please advise.

## 2012-11-08 ENCOUNTER — Ambulatory Visit: Payer: Medicare Other | Admitting: Vascular Surgery

## 2012-11-21 ENCOUNTER — Encounter: Payer: Self-pay | Admitting: Vascular Surgery

## 2012-11-22 ENCOUNTER — Ambulatory Visit: Payer: Medicare Other | Admitting: Vascular Surgery

## 2012-11-25 ENCOUNTER — Encounter (HOSPITAL_BASED_OUTPATIENT_CLINIC_OR_DEPARTMENT_OTHER): Payer: Medicare Other | Attending: General Surgery

## 2012-11-25 DIAGNOSIS — L97409 Non-pressure chronic ulcer of unspecified heel and midfoot with unspecified severity: Secondary | ICD-10-CM | POA: Insufficient documentation

## 2012-11-25 DIAGNOSIS — E1169 Type 2 diabetes mellitus with other specified complication: Secondary | ICD-10-CM | POA: Insufficient documentation

## 2012-11-25 DIAGNOSIS — R937 Abnormal findings on diagnostic imaging of other parts of musculoskeletal system: Secondary | ICD-10-CM | POA: Insufficient documentation

## 2012-11-25 DIAGNOSIS — L97509 Non-pressure chronic ulcer of other part of unspecified foot with unspecified severity: Secondary | ICD-10-CM | POA: Insufficient documentation

## 2012-12-02 ENCOUNTER — Other Ambulatory Visit (HOSPITAL_BASED_OUTPATIENT_CLINIC_OR_DEPARTMENT_OTHER): Payer: Self-pay | Admitting: General Surgery

## 2012-12-03 ENCOUNTER — Other Ambulatory Visit (HOSPITAL_BASED_OUTPATIENT_CLINIC_OR_DEPARTMENT_OTHER): Payer: Self-pay | Admitting: General Surgery

## 2012-12-03 ENCOUNTER — Ambulatory Visit (HOSPITAL_COMMUNITY)
Admission: RE | Admit: 2012-12-03 | Discharge: 2012-12-03 | Disposition: A | Discharge status: Home / Self Care | Payer: Medicare Other | Source: Ambulatory Visit | Attending: General Surgery | Admitting: General Surgery

## 2012-12-03 ENCOUNTER — Other Ambulatory Visit (HOSPITAL_COMMUNITY): Payer: Self-pay | Admitting: General Surgery

## 2012-12-03 DIAGNOSIS — R52 Pain, unspecified: Secondary | ICD-10-CM

## 2012-12-03 DIAGNOSIS — M899 Disorder of bone, unspecified: Secondary | ICD-10-CM | POA: Insufficient documentation

## 2012-12-03 DIAGNOSIS — L97509 Non-pressure chronic ulcer of other part of unspecified foot with unspecified severity: Secondary | ICD-10-CM | POA: Insufficient documentation

## 2012-12-03 DIAGNOSIS — E1169 Type 2 diabetes mellitus with other specified complication: Secondary | ICD-10-CM | POA: Insufficient documentation

## 2012-12-03 DIAGNOSIS — L97409 Non-pressure chronic ulcer of unspecified heel and midfoot with unspecified severity: Secondary | ICD-10-CM | POA: Insufficient documentation

## 2012-12-12 ENCOUNTER — Other Ambulatory Visit (HOSPITAL_BASED_OUTPATIENT_CLINIC_OR_DEPARTMENT_OTHER): Payer: Self-pay | Admitting: General Surgery

## 2012-12-12 ENCOUNTER — Ambulatory Visit (HOSPITAL_COMMUNITY)
Admission: RE | Admit: 2012-12-12 | Discharge: 2012-12-12 | Disposition: A | Discharge status: Home / Self Care | Payer: Medicare Other | Source: Ambulatory Visit | Attending: General Surgery | Admitting: General Surgery

## 2012-12-12 DIAGNOSIS — T148XXA Other injury of unspecified body region, initial encounter: Secondary | ICD-10-CM

## 2012-12-12 DIAGNOSIS — X58XXXA Exposure to other specified factors, initial encounter: Secondary | ICD-10-CM | POA: Insufficient documentation

## 2012-12-12 DIAGNOSIS — S91309A Unspecified open wound, unspecified foot, initial encounter: Secondary | ICD-10-CM | POA: Insufficient documentation

## 2012-12-12 NOTE — Progress Notes (Signed)
[facility]  NAME:  Alexandra Wiley, Alexandra Wiley                  ACCOUNT NO.:  MEDICAL RECORD NO.:  [id number]      DATE OF BIRTH:  02/03/1946  PHYSICIAN:  Ardath Sax, M.D.           VISIT DATE:                                  OFFICE VISIT   This is a 67 year old lady who has a history of diabetic foot ulcer on her left heel and her left great toe.  She has been treated with antibiotics and we have treated her with multiple debridements and Santyl, and at the present time, we are putting Santyl and silver alginate on.  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  We would like to consider adding hyperbaric oxygen treatments to her antibiotics and her local debridements here in the clinic as we would classify this as a Wagner 3 wound both on the heel and the great toe and we checked out her TCOMs and she has a good blood supply that would respond well to the hyperbaric oxygen treatments.     Ardath Sax, M.D.     PP/MEDQ  D:  12/09/2012  T:  12/10/2012  Job:  [id number]

## 2012-12-15 LAB — GLUCOSE, CAPILLARY
Glucose-Capillary: 138 mg/dL — ABNORMAL HIGH (ref 70–99)
Glucose-Capillary: 209 mg/dL — ABNORMAL HIGH (ref 70–99)

## 2012-12-16 LAB — GLUCOSE, CAPILLARY
Glucose-Capillary: 124 mg/dL — ABNORMAL HIGH (ref 70–99)
Glucose-Capillary: 94 mg/dL (ref 70–99)

## 2012-12-19 LAB — GLUCOSE, CAPILLARY: Glucose-Capillary: 93 mg/dL (ref 70–99)

## 2012-12-20 ENCOUNTER — Encounter (HOSPITAL_BASED_OUTPATIENT_CLINIC_OR_DEPARTMENT_OTHER): Payer: Medicare Other | Attending: General Surgery

## 2012-12-20 DIAGNOSIS — E1169 Type 2 diabetes mellitus with other specified complication: Secondary | ICD-10-CM | POA: Insufficient documentation

## 2012-12-20 DIAGNOSIS — L97509 Non-pressure chronic ulcer of other part of unspecified foot with unspecified severity: Secondary | ICD-10-CM | POA: Insufficient documentation

## 2012-12-20 DIAGNOSIS — L97409 Non-pressure chronic ulcer of unspecified heel and midfoot with unspecified severity: Secondary | ICD-10-CM | POA: Insufficient documentation

## 2012-12-21 LAB — GLUCOSE, CAPILLARY
Glucose-Capillary: 85 mg/dL (ref 70–99)
Glucose-Capillary: 104 mg/dL — ABNORMAL HIGH (ref 70–99)

## 2012-12-22 LAB — GLUCOSE, CAPILLARY: Glucose-Capillary: 122 mg/dL — ABNORMAL HIGH (ref 70–99)

## 2012-12-23 LAB — GLUCOSE, CAPILLARY
Glucose-Capillary: 121 mg/dL — ABNORMAL HIGH (ref 70–99)
Glucose-Capillary: 91 mg/dL (ref 70–99)

## 2012-12-26 LAB — GLUCOSE, CAPILLARY: Glucose-Capillary: 170 mg/dL — ABNORMAL HIGH (ref 70–99)

## 2012-12-27 ENCOUNTER — Encounter: Payer: Medicare Other | Attending: Physical Medicine & Rehabilitation | Admitting: Physical Medicine & Rehabilitation

## 2012-12-27 DIAGNOSIS — G959 Disease of spinal cord, unspecified: Secondary | ICD-10-CM | POA: Insufficient documentation

## 2012-12-27 DIAGNOSIS — M1710 Unilateral primary osteoarthritis, unspecified knee: Secondary | ICD-10-CM | POA: Insufficient documentation

## 2012-12-27 DIAGNOSIS — Z79899 Other long term (current) drug therapy: Secondary | ICD-10-CM | POA: Insufficient documentation

## 2012-12-27 DIAGNOSIS — N319 Neuromuscular dysfunction of bladder, unspecified: Secondary | ICD-10-CM | POA: Insufficient documentation

## 2012-12-27 DIAGNOSIS — I739 Peripheral vascular disease, unspecified: Secondary | ICD-10-CM | POA: Insufficient documentation

## 2012-12-27 DIAGNOSIS — S78119A Complete traumatic amputation at level between unspecified hip and knee, initial encounter: Secondary | ICD-10-CM | POA: Insufficient documentation

## 2012-12-27 DIAGNOSIS — K592 Neurogenic bowel, not elsewhere classified: Secondary | ICD-10-CM | POA: Insufficient documentation

## 2012-12-27 DIAGNOSIS — L97409 Non-pressure chronic ulcer of unspecified heel and midfoot with unspecified severity: Secondary | ICD-10-CM | POA: Insufficient documentation

## 2012-12-27 DIAGNOSIS — Z5181 Encounter for therapeutic drug level monitoring: Secondary | ICD-10-CM | POA: Insufficient documentation

## 2012-12-27 LAB — GLUCOSE, CAPILLARY: Glucose-Capillary: 157 mg/dL — ABNORMAL HIGH (ref 70–99)

## 2012-12-29 LAB — GLUCOSE, CAPILLARY: Glucose-Capillary: 144 mg/dL — ABNORMAL HIGH (ref 70–99)

## 2012-12-30 LAB — GLUCOSE, CAPILLARY: Glucose-Capillary: 151 mg/dL — ABNORMAL HIGH (ref 70–99)

## 2013-01-02 LAB — GLUCOSE, CAPILLARY
Glucose-Capillary: 142 mg/dL — ABNORMAL HIGH (ref 70–99)
Glucose-Capillary: 249 mg/dL — ABNORMAL HIGH (ref 70–99)

## 2013-01-04 LAB — GLUCOSE, CAPILLARY: Glucose-Capillary: 93 mg/dL (ref 70–99)

## 2013-01-09 LAB — GLUCOSE, CAPILLARY
Glucose-Capillary: 154 mg/dL — ABNORMAL HIGH (ref 70–99)
Glucose-Capillary: 118 mg/dL — ABNORMAL HIGH (ref 70–99)

## 2013-01-10 LAB — GLUCOSE, CAPILLARY
Glucose-Capillary: 104 mg/dL — ABNORMAL HIGH (ref 70–99)
Glucose-Capillary: 125 mg/dL — ABNORMAL HIGH (ref 70–99)
Glucose-Capillary: 98 mg/dL (ref 70–99)
Glucose-Capillary: 180 mg/dL — ABNORMAL HIGH (ref 70–99)

## 2013-01-13 LAB — GLUCOSE, CAPILLARY: Glucose-Capillary: 109 mg/dL — ABNORMAL HIGH (ref 70–99)

## 2013-01-16 LAB — GLUCOSE, CAPILLARY: Glucose-Capillary: 102 mg/dL — ABNORMAL HIGH (ref 70–99)

## 2013-01-17 LAB — GLUCOSE, CAPILLARY: Glucose-Capillary: 86 mg/dL (ref 70–99)

## 2013-01-18 ENCOUNTER — Encounter (HOSPITAL_BASED_OUTPATIENT_CLINIC_OR_DEPARTMENT_OTHER): Payer: Medicare Other | Admitting: Physical Medicine & Rehabilitation

## 2013-01-18 ENCOUNTER — Encounter: Payer: Self-pay | Admitting: Physical Medicine & Rehabilitation

## 2013-01-18 VITALS — BP 108/66 | HR 80 | Resp 14 | Ht 59.0 in | Wt 160.0 lb

## 2013-01-18 DIAGNOSIS — K592 Neurogenic bowel, not elsewhere classified: Secondary | ICD-10-CM

## 2013-01-18 DIAGNOSIS — L97429 Non-pressure chronic ulcer of left heel and midfoot with unspecified severity: Secondary | ICD-10-CM

## 2013-01-18 DIAGNOSIS — L97409 Non-pressure chronic ulcer of unspecified heel and midfoot with unspecified severity: Secondary | ICD-10-CM

## 2013-01-18 DIAGNOSIS — N319 Neuromuscular dysfunction of bladder, unspecified: Secondary | ICD-10-CM

## 2013-01-18 DIAGNOSIS — Z5181 Encounter for therapeutic drug level monitoring: Secondary | ICD-10-CM

## 2013-01-18 DIAGNOSIS — G9581 Conus medullaris syndrome: Secondary | ICD-10-CM

## 2013-01-18 DIAGNOSIS — Z89611 Acquired absence of right leg above knee: Secondary | ICD-10-CM

## 2013-01-18 DIAGNOSIS — M1710 Unilateral primary osteoarthritis, unspecified knee: Secondary | ICD-10-CM

## 2013-01-18 DIAGNOSIS — G959 Disease of spinal cord, unspecified: Secondary | ICD-10-CM

## 2013-01-18 DIAGNOSIS — S78119A Complete traumatic amputation at level between unspecified hip and knee, initial encounter: Secondary | ICD-10-CM

## 2013-01-18 DIAGNOSIS — Z79899 Other long term (current) drug therapy: Secondary | ICD-10-CM

## 2013-01-18 DIAGNOSIS — I739 Peripheral vascular disease, unspecified: Secondary | ICD-10-CM

## 2013-01-18 MED ORDER — OXYCODONE HCL 5 MG PO CAPS: 5.0000 mg | ORAL_CAPSULE | Freq: Four times a day (QID) | ORAL | Status: DC | PRN

## 2013-01-18 NOTE — Progress Notes (Signed)
Subjective:    Patient ID: Alexandra Wiley, female    DOB: [birth year], 67 y.o.   MRN: [id number]  HPI  Alexandra Wiley is back regarding her multiple issues. She continues to see the wound care clinic. She is receiving hyperbaric oxygen and the wound is improving.   She complains of pain at her right stump as well as the left knee. She is taking gabpentin, nortriptyline, and oxycodone. She may take up to 3 or 4 oxycodone's per day. Baclofen is on board for spasms. She is taking cipro for wound coverage.   Her sleep is fair. Her gout hasn't flared since February.     Pain Inventory Average Pain 9 Pain Right Now 6 My pain is sharp, burning, stabbing, tingling and aching  In the last 24 hours, has pain interfered with the following? General activity 7 Relation with others 9 Enjoyment of life 9 What TIME of day is your pain at its worst? morning and evening Sleep (in general) Fair  Pain is worse with: some activites Pain improves with: medication Relief from Meds: 8  Mobility ability to climb steps?  no do you drive?  no use a wheelchair transfers alone  Function I need assistance with the following:  dressing, bathing, toileting, meal prep, household duties and shopping  Neuro/Psych bladder control problems bowel control problems tingling spasms  Prior Studies Any changes since last visit?  no  Physicians involved in your care Any changes since last visit?  no   Family History  Problem Relation Age of Onset  . Arthritis Mother   . Hypertension Mother   . Heart disease Mother     before age 71  . Hyperlipidemia Mother   . Other Mother     varicose veins  . Heart attack Mother   . Heart disease Father   . Diabetes Father   . Hypertension Sister   . Hyperlipidemia Sister   . Other Sister     varicose veins  . Hypertension Son   . Heart attack Son   . Diabetes Brother   . Heart disease Brother   . Hypertension Brother   . Diabetes Daughter   .  Hyperlipidemia Daughter   . Heart disease Daughter     before age 27  . Other Daughter     varicose veins   History   Social History  . Marital Status: Married    Spouse Name: N/A    Number of Children: N/A  . Years of Education: N/A   Social History Main Topics  . Smoking status: Current Every Day Smoker -- 0.50 packs/day for 25 years  . Smokeless tobacco: Never Used     Comment: pt states that she is not intrested in support group because she would not be able to get there  . Alcohol Use: No     Comment: 02/02/12 "last alcohol ~ 2008"  . Drug Use: No  . Sexually Active: No   Other Topics Concern  . None   Social History Narrative  . None   Past Surgical History  Procedure Laterality Date  . Foot tendon surgery  2000    Left foot  . Tubal ligation    . Femur fracture surgery  1960's    Left leg, repair of non-union femur fracture by Dr. Orland Jarred  . Breast lumpectomy  ~1975    Right breast carcinoma in situ  . Tonsillectomy and adenoidectomy  ~ 1961  . Dilation and curettage of uterus    .  Fracture surgery    . Amputation  02/05/2012    Procedure: AMPUTATION BELOW KNEE;  Surgeon: Larina Earthly, MD;  Location: [facility] OR;  Service: Vascular;  Laterality: Right;  right  above knee amputation  . Right leg amputation  01/2012   Past Medical History  Diagnosis Date  . Ulcer   . Hypertension   . Heart murmur   . Gout   . PVD (peripheral vascular disease)   . Pain in limb   . CHF (congestive heart failure)   . 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 Conus medullaris syndrome   . Arthritis   . Breast cancer ~ 1975    right  . Carpal tunnel syndrome of right wrist   . High cholesterol   . Angina   . Myocardial infarction 1996  . Pneumonia ~ 2002; ~1975  . Type II diabetes mellitus   . Exertional dyspnea   . Hypothyroidism   . Seizures 02/02/12    "used to; a long time ago"  . Stroke 10/2008    "spinal cord"  . Neurogenic bladder   . Irregular heartbeat    BP  108/66  Pulse 80  Resp 14  Ht 4\' 11"  (1.499 m)  Wt 160 lb (72.576 kg)  BMI 32.3 kg/m2  SpO2 100%     Review of Systems  All other systems reviewed and are negative.       Objective:   Physical Exam General: Alert and oriented x 3, No apparent distress  HEENT: Head is normocephalic, atraumatic, PERRLA, EOMI, sclera anicteric, oral mucosa pink and moist, dentition intact, ext ear canals clear,  Neck: Supple without JVD or lymphadenopathy  Heart: Reg rate and rhythm. No murmurs rubs or gallops  Chest: CTA bilaterally without wheezes, rales, or rhonchi; no distress  Abdomen: Soft, non-tender, non-distended, bowel sounds positive.  Extremities: No clubbing, cyanosis, or edema. Pulses are 2+  Skin: Right AKA site intact. LLE notable for. Right aka is well healed. The left foot still has a healing area on the heel which is 1.5 inches in diameter but is granulating. The other areas show scarring along the first toe and dorsum of the foot but they are closed the leg and foot are warm. Right AKA is well healed---a little tender along the medial and lateral borders. .Neuro: pt is alert and oriented. Sensory deficits below the waist.  Musculoskeletal: She has tenderness and valgus deformity at the left knee.  Psych: Pt's affect is appropriate. Pt is cooperative   Assessment & Plan:   1. Conus medullaris syndrome with paraplegia  2. Neurogenic bowel and bladder  3. Osteoarthritis left knee  4. History of gout with acute flare affecting left knee today.  5. Right AKA  6. Necrotic left foot- with substantial healing  Plan:  1. Left foot was redressed. Continue with the excellent care being provided through the wound care center. I am truly amazed that her foot has healed to the point it has. 2. For pain, continue with oxycodone 5mg  #60, and voltaren gel which she will use on her left knee  3. I will see her back in 4 months time.

## 2013-01-18 NOTE — Patient Instructions (Signed)
CONTINUE WITH YOUR DILIGENT FOOT CARE AS YOU ARE

## 2013-01-19 ENCOUNTER — Encounter (HOSPITAL_BASED_OUTPATIENT_CLINIC_OR_DEPARTMENT_OTHER): Payer: Medicare Other | Attending: General Surgery

## 2013-01-19 DIAGNOSIS — E1169 Type 2 diabetes mellitus with other specified complication: Secondary | ICD-10-CM | POA: Insufficient documentation

## 2013-01-19 DIAGNOSIS — L97409 Non-pressure chronic ulcer of unspecified heel and midfoot with unspecified severity: Secondary | ICD-10-CM | POA: Insufficient documentation

## 2013-01-19 DIAGNOSIS — S78119A Complete traumatic amputation at level between unspecified hip and knee, initial encounter: Secondary | ICD-10-CM | POA: Insufficient documentation

## 2013-01-19 DIAGNOSIS — G9519 Other vascular myelopathies: Secondary | ICD-10-CM | POA: Insufficient documentation

## 2013-01-19 DIAGNOSIS — N319 Neuromuscular dysfunction of bladder, unspecified: Secondary | ICD-10-CM | POA: Insufficient documentation

## 2013-01-19 DIAGNOSIS — G8220 Paraplegia, unspecified: Secondary | ICD-10-CM | POA: Insufficient documentation

## 2013-01-20 LAB — GLUCOSE, CAPILLARY
Glucose-Capillary: 141 mg/dL — ABNORMAL HIGH (ref 70–99)
Glucose-Capillary: 112 mg/dL — ABNORMAL HIGH (ref 70–99)

## 2013-01-24 LAB — GLUCOSE, CAPILLARY: Glucose-Capillary: 116 mg/dL — ABNORMAL HIGH (ref 70–99)

## 2013-01-27 LAB — GLUCOSE, CAPILLARY
Glucose-Capillary: 157 mg/dL — ABNORMAL HIGH (ref 70–99)
Glucose-Capillary: 201 mg/dL — ABNORMAL HIGH (ref 70–99)

## 2013-01-28 NOTE — Progress Notes (Signed)
[facility]  NAME:  [name], [name]             ACCOUNT NO.:  [id number]  MEDICAL RECORD NO.:  [id number]      DATE OF BIRTH:  06-30-1946  PHYSICIAN:  Ardath Sax, M.D.           VISIT DATE:                                  OFFICE VISIT   Alexandra Wiley is a 67 year old type 2 diabetic who has already had a right above-knee amputation and has a Wagner 3 diabetic foot ulcer on her left heel.  This lady has many problems.  She is a paraplegic with spinal cord infarct.  She also has a neurogenic bladder.  We have been treating her diabetic ulcer on her left heel for some time, and in fact we just recently upstaged her to a Wagner 4 as it was down to the fascia.  We have been treating it with silver alginate and most recently collagen and we have also noted that since she started hyperbaric oxygen treatments, it has healed very nicely, and is a great deal smaller.  I think it would be imperative to continue the hyperbaric oxygen treatments as I feel this would contribute greatly to her avoiding another amputation, so we are applying for another 30 treatments of hyperbaric oxygen treatments.     Ardath Sax, M.D.     PP/MEDQ  D:  01/27/2013  T:  01/28/2013  Job:  [id number]

## 2013-02-01 LAB — GLUCOSE, CAPILLARY
Glucose-Capillary: 221 mg/dL — ABNORMAL HIGH (ref 70–99)
Glucose-Capillary: 112 mg/dL — ABNORMAL HIGH (ref 70–99)

## 2013-02-02 LAB — GLUCOSE, CAPILLARY: Glucose-Capillary: 245 mg/dL — ABNORMAL HIGH (ref 70–99)

## 2013-02-03 LAB — GLUCOSE, CAPILLARY: Glucose-Capillary: 83 mg/dL (ref 70–99)

## 2013-02-06 LAB — GLUCOSE, CAPILLARY: Glucose-Capillary: 161 mg/dL — ABNORMAL HIGH (ref 70–99)

## 2013-02-08 LAB — GLUCOSE, CAPILLARY
Glucose-Capillary: 152 mg/dL — ABNORMAL HIGH (ref 70–99)
Glucose-Capillary: 117 mg/dL — ABNORMAL HIGH (ref 70–99)

## 2013-02-10 LAB — GLUCOSE, CAPILLARY: Glucose-Capillary: 125 mg/dL — ABNORMAL HIGH (ref 70–99)

## 2013-02-14 LAB — GLUCOSE, CAPILLARY: Glucose-Capillary: 128 mg/dL — ABNORMAL HIGH (ref 70–99)

## 2013-02-15 LAB — GLUCOSE, CAPILLARY: Glucose-Capillary: 105 mg/dL — ABNORMAL HIGH (ref 70–99)

## 2013-02-16 LAB — GLUCOSE, CAPILLARY: Glucose-Capillary: 142 mg/dL — ABNORMAL HIGH (ref 70–99)

## 2013-02-17 LAB — GLUCOSE, CAPILLARY: Glucose-Capillary: 107 mg/dL — ABNORMAL HIGH (ref 70–99)

## 2013-02-20 ENCOUNTER — Encounter (HOSPITAL_BASED_OUTPATIENT_CLINIC_OR_DEPARTMENT_OTHER): Payer: Medicare Other | Attending: General Surgery

## 2013-02-20 DIAGNOSIS — L97409 Non-pressure chronic ulcer of unspecified heel and midfoot with unspecified severity: Secondary | ICD-10-CM | POA: Insufficient documentation

## 2013-02-20 DIAGNOSIS — E1169 Type 2 diabetes mellitus with other specified complication: Secondary | ICD-10-CM | POA: Insufficient documentation

## 2013-02-21 LAB — GLUCOSE, CAPILLARY
Glucose-Capillary: 84 mg/dL (ref 70–99)
Glucose-Capillary: 197 mg/dL — ABNORMAL HIGH (ref 70–99)

## 2013-02-23 LAB — GLUCOSE, CAPILLARY: Glucose-Capillary: 134 mg/dL — ABNORMAL HIGH (ref 70–99)

## 2013-02-24 LAB — GLUCOSE, CAPILLARY
Glucose-Capillary: 176 mg/dL — ABNORMAL HIGH (ref 70–99)
Glucose-Capillary: 105 mg/dL — ABNORMAL HIGH (ref 70–99)

## 2013-02-27 LAB — GLUCOSE, CAPILLARY
Glucose-Capillary: 209 mg/dL — ABNORMAL HIGH (ref 70–99)
Glucose-Capillary: 100 mg/dL — ABNORMAL HIGH (ref 70–99)

## 2013-03-01 LAB — GLUCOSE, CAPILLARY: Glucose-Capillary: 136 mg/dL — ABNORMAL HIGH (ref 70–99)

## 2013-03-03 LAB — GLUCOSE, CAPILLARY: Glucose-Capillary: 115 mg/dL — ABNORMAL HIGH (ref 70–99)

## 2013-03-06 LAB — GLUCOSE, CAPILLARY
Glucose-Capillary: 111 mg/dL — ABNORMAL HIGH (ref 70–99)
Glucose-Capillary: 100 mg/dL — ABNORMAL HIGH (ref 70–99)
Glucose-Capillary: 109 mg/dL — ABNORMAL HIGH (ref 70–99)
Glucose-Capillary: 110 mg/dL — ABNORMAL HIGH (ref 70–99)

## 2013-03-08 LAB — GLUCOSE, CAPILLARY: Glucose-Capillary: 190 mg/dL — ABNORMAL HIGH (ref 70–99)

## 2013-03-09 LAB — GLUCOSE, CAPILLARY
Glucose-Capillary: 156 mg/dL — ABNORMAL HIGH (ref 70–99)
Glucose-Capillary: 162 mg/dL — ABNORMAL HIGH (ref 70–99)

## 2013-03-10 LAB — GLUCOSE, CAPILLARY
Glucose-Capillary: 128 mg/dL — ABNORMAL HIGH (ref 70–99)
Glucose-Capillary: 121 mg/dL — ABNORMAL HIGH (ref 70–99)

## 2013-03-13 LAB — GLUCOSE, CAPILLARY
Glucose-Capillary: 210 mg/dL — ABNORMAL HIGH (ref 70–99)
Glucose-Capillary: 162 mg/dL — ABNORMAL HIGH (ref 70–99)

## 2013-03-14 LAB — GLUCOSE, CAPILLARY: Glucose-Capillary: 159 mg/dL — ABNORMAL HIGH (ref 70–99)

## 2013-03-15 LAB — GLUCOSE, CAPILLARY: Glucose-Capillary: 193 mg/dL — ABNORMAL HIGH (ref 70–99)

## 2013-03-16 LAB — GLUCOSE, CAPILLARY: Glucose-Capillary: 132 mg/dL — ABNORMAL HIGH (ref 70–99)

## 2013-03-17 LAB — GLUCOSE, CAPILLARY: Glucose-Capillary: 134 mg/dL — ABNORMAL HIGH (ref 70–99)

## 2013-03-21 ENCOUNTER — Encounter (HOSPITAL_BASED_OUTPATIENT_CLINIC_OR_DEPARTMENT_OTHER): Payer: Medicare Other | Attending: General Surgery

## 2013-03-21 DIAGNOSIS — E1169 Type 2 diabetes mellitus with other specified complication: Secondary | ICD-10-CM | POA: Insufficient documentation

## 2013-03-21 DIAGNOSIS — L97409 Non-pressure chronic ulcer of unspecified heel and midfoot with unspecified severity: Secondary | ICD-10-CM | POA: Insufficient documentation

## 2013-03-21 LAB — GLUCOSE, CAPILLARY
Glucose-Capillary: 177 mg/dL — ABNORMAL HIGH (ref 70–99)
Glucose-Capillary: 131 mg/dL — ABNORMAL HIGH (ref 70–99)

## 2013-03-23 LAB — GLUCOSE, CAPILLARY
Glucose-Capillary: 139 mg/dL — ABNORMAL HIGH (ref 70–99)
Glucose-Capillary: 114 mg/dL — ABNORMAL HIGH (ref 70–99)

## 2013-03-27 LAB — GLUCOSE, CAPILLARY
Glucose-Capillary: 163 mg/dL — ABNORMAL HIGH (ref 70–99)
Glucose-Capillary: 227 mg/dL — ABNORMAL HIGH (ref 70–99)

## 2013-04-21 ENCOUNTER — Encounter (HOSPITAL_BASED_OUTPATIENT_CLINIC_OR_DEPARTMENT_OTHER): Payer: Medicare Other | Attending: General Surgery

## 2013-04-21 DIAGNOSIS — L97409 Non-pressure chronic ulcer of unspecified heel and midfoot with unspecified severity: Secondary | ICD-10-CM | POA: Insufficient documentation

## 2013-04-21 DIAGNOSIS — E1169 Type 2 diabetes mellitus with other specified complication: Secondary | ICD-10-CM | POA: Insufficient documentation

## 2013-05-19 ENCOUNTER — Encounter: Payer: Medicare Other | Admitting: Physical Medicine & Rehabilitation

## 2013-05-26 ENCOUNTER — Encounter (HOSPITAL_BASED_OUTPATIENT_CLINIC_OR_DEPARTMENT_OTHER): Payer: Medicare Other | Attending: General Surgery

## 2013-05-26 DIAGNOSIS — L97409 Non-pressure chronic ulcer of unspecified heel and midfoot with unspecified severity: Secondary | ICD-10-CM | POA: Insufficient documentation

## 2013-05-26 DIAGNOSIS — E1169 Type 2 diabetes mellitus with other specified complication: Secondary | ICD-10-CM | POA: Insufficient documentation

## 2013-05-31 ENCOUNTER — Encounter: Payer: Medicare Other | Attending: Physical Medicine & Rehabilitation | Admitting: Physical Medicine & Rehabilitation

## 2013-05-31 ENCOUNTER — Encounter: Payer: Self-pay | Admitting: Physical Medicine & Rehabilitation

## 2013-05-31 VITALS — BP 128/37 | HR 72 | Resp 16 | Ht 59.0 in | Wt 161.0 lb

## 2013-05-31 DIAGNOSIS — G9581 Conus medullaris syndrome: Secondary | ICD-10-CM

## 2013-05-31 DIAGNOSIS — G8220 Paraplegia, unspecified: Secondary | ICD-10-CM | POA: Insufficient documentation

## 2013-05-31 DIAGNOSIS — Z89611 Acquired absence of right leg above knee: Secondary | ICD-10-CM

## 2013-05-31 DIAGNOSIS — L97409 Non-pressure chronic ulcer of unspecified heel and midfoot with unspecified severity: Secondary | ICD-10-CM

## 2013-05-31 DIAGNOSIS — G9589 Other specified diseases of spinal cord: Secondary | ICD-10-CM | POA: Insufficient documentation

## 2013-05-31 DIAGNOSIS — K592 Neurogenic bowel, not elsewhere classified: Secondary | ICD-10-CM

## 2013-05-31 DIAGNOSIS — M109 Gout, unspecified: Secondary | ICD-10-CM | POA: Insufficient documentation

## 2013-05-31 DIAGNOSIS — I739 Peripheral vascular disease, unspecified: Secondary | ICD-10-CM

## 2013-05-31 DIAGNOSIS — N319 Neuromuscular dysfunction of bladder, unspecified: Secondary | ICD-10-CM | POA: Insufficient documentation

## 2013-05-31 DIAGNOSIS — L97429 Non-pressure chronic ulcer of left heel and midfoot with unspecified severity: Secondary | ICD-10-CM

## 2013-05-31 DIAGNOSIS — M1710 Unilateral primary osteoarthritis, unspecified knee: Secondary | ICD-10-CM | POA: Insufficient documentation

## 2013-05-31 DIAGNOSIS — L988 Other specified disorders of the skin and subcutaneous tissue: Secondary | ICD-10-CM | POA: Insufficient documentation

## 2013-05-31 DIAGNOSIS — G8929 Other chronic pain: Secondary | ICD-10-CM | POA: Insufficient documentation

## 2013-05-31 DIAGNOSIS — L97404 Non-pressure chronic ulcer of unspecified heel and midfoot with necrosis of bone: Secondary | ICD-10-CM

## 2013-05-31 DIAGNOSIS — G959 Disease of spinal cord, unspecified: Secondary | ICD-10-CM

## 2013-05-31 DIAGNOSIS — S78119A Complete traumatic amputation at level between unspecified hip and knee, initial encounter: Secondary | ICD-10-CM

## 2013-05-31 MED ORDER — OXYCODONE HCL 5 MG PO CAPS: 5.0000 mg | ORAL_CAPSULE | Freq: Four times a day (QID) | ORAL | Status: DC | PRN

## 2013-05-31 NOTE — Patient Instructions (Signed)
CALL ME WITH ANY PROBLEMS OR QUESTIONS (#[phone]).  HAVE A GOOD DAY  

## 2013-05-31 NOTE — Progress Notes (Signed)
Subjective:    Patient ID: Alexandra Wiley, female    DOB: 1945-10-20, 67 y.o.   MRN: [id number]  HPI  Alexandra Wiley is back regarding her chronic pain and related wound/neurological issues. She is taking an occasional oxycodone for pain. She still has medication left from the 4/30 rx I provided her.  She remains on the pamelor and gabapentin for neuropathic pain. She uses baclofen occasionally for spasms. Her sleep has been good and her pain has in general improved.  She has an indwelling catheter.  She's on an every other bowel program which works well for her.   Alexandra Wiley asked about a prosthesis for her right leg. She is interested in pursuing one if it's feasible.       Pain Inventory Average Pain 9 Pain Right Now 5 My pain is sharp, burning, stabbing, tingling and aching  In the last 24 hours, has pain interfered with the following? General activity 6 Relation with others 5 Enjoyment of life 5 What TIME of day is your pain at its worst? evening and night Sleep (in general) Fair  Pain is worse with: some activites Pain improves with: medication Relief from Meds: 5  Mobility ability to climb steps?  no do you drive?  no use a wheelchair transfers alone Do you have any goals in this area?  yes  Function I need assistance with the following:  household duties and shopping  Neuro/Psych bladder control problems bowel control problems numbness spasms  Prior Studies wound  Physicians involved in your care Dr Chestine Spore, Dr Jimmey Ralph   Family History  Problem Relation Age of Onset  . Arthritis Mother   . Hypertension Mother   . Heart disease Mother     before age 80  . Hyperlipidemia Mother   . Other Mother     varicose veins  . Heart attack Mother   . Heart disease Father   . Diabetes Father   . Hypertension Sister   . Hyperlipidemia Sister   . Other Sister     varicose veins  . Hypertension Son   . Heart attack Son   . Diabetes Brother   . Heart disease  Brother   . Hypertension Brother   . Diabetes Daughter   . Hyperlipidemia Daughter   . Heart disease Daughter     before age 33  . Other Daughter     varicose veins   History   Social History  . Marital Status: Married    Spouse Name: N/A    Number of Children: N/A  . Years of Education: N/A   Social History Main Topics  . Smoking status: Current Every Day Smoker -- 0.50 packs/day for 25 years  . Smokeless tobacco: Never Used     Comment: pt states that she is not intrested in support group because she would not be able to get there  . Alcohol Use: No     Comment: 02/02/12 "last alcohol ~ 2008"  . Drug Use: No  . Sexual Activity: No   Other Topics Concern  . None   Social History Narrative  . None   Past Surgical History  Procedure Laterality Date  . Foot tendon surgery  2000    Left foot  . Tubal ligation    . Femur fracture surgery  1960's    Left leg, repair of non-union femur fracture by Dr. Orland Jarred  . Breast lumpectomy  ~1975    Right breast carcinoma in situ  . Tonsillectomy and adenoidectomy  ~  1961  . Dilation and curettage of uterus    . Fracture surgery    . Amputation  02/05/2012    Procedure: AMPUTATION BELOW KNEE;  Surgeon: Larina Earthly, MD;  Location: [facility] OR;  Service: Vascular;  Laterality: Right;  right  above knee amputation  . Right leg amputation  01/2012   Past Medical History  Diagnosis Date  . Ulcer   . Hypertension   . Heart murmur   . Gout   . PVD (peripheral vascular disease)   . Pain in limb   . CHF (congestive heart failure)   . 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 Conus medullaris syndrome   . Arthritis   . Breast cancer ~ 1975    right  . Carpal tunnel syndrome of right wrist   . High cholesterol   . Angina   . Myocardial infarction 1996  . Pneumonia ~ 2002; ~1975  . Type II diabetes mellitus   . Exertional dyspnea   . Hypothyroidism   . Seizures 02/02/12    "used to; a long time ago"  . Stroke 10/2008    "spinal  cord"  . Neurogenic bladder   . Irregular heartbeat    BP 128/37  Pulse 72  Resp 16  Ht 4\' 11"  (1.499 m)  Wt 161 lb (73.029 kg)  BMI 32.5 kg/m2  SpO2 98%     Review of Systems  Gastrointestinal:       Bowel control problems  Genitourinary:       Bladder control problems  Neurological: Positive for numbness.       Spasm  All other systems reviewed and are negative.       Objective:   Physical Exam  General: Alert and oriented x 3, No apparent distress  HEENT: Head is normocephalic, atraumatic, PERRLA, EOMI, sclera anicteric, oral mucosa pink and moist, dentition intact, ext ear canals clear,  Neck: Supple without JVD or lymphadenopathy  Heart: Reg rate and rhythm. No murmurs rubs or gallops  Chest: CTA bilaterally without wheezes, rales, or rhonchi; no distress  Abdomen: Soft, non-tender, non-distended, bowel sounds positive.  Extremities: No clubbing, cyanosis, or edema. Pulses are 2+  Skin: Right AKA site intact. LLE notable for. Right aka is well healed. The left foot is wrapped. Right AKA is well healed---a little tender along the medial and lateral borders.  .Neuro: pt is alert and oriented. Sensory deficits below the waist. She has KE on either leg to about 3/5. HF is grossly 2+ to 3/5 as well. UE strength is 5.5 Musculoskeletal: She has tenderness and valgus deformity at the left knee.  Psych: Pt's affect is appropriate. Pt is cooperative   Assessment & Plan:   1. Conus medullaris syndrome with paraplegia  2. Neurogenic bowel and bladder  3. Osteoarthritis left knee  4. History of gout with acute flare affecting left knee today.  5. Right AKA  6. Necrotic left foot- with substantial healing   Plan:  1. Continue wound care per [facility] wound clinic  2. For pain, continue with oxycodone 5mg  #60, and voltaren gel which she will use on her left knee. She is using this rarely at this point. 3. We discussed the potential for a prosthesis, which wouldn't be realistic  unless the left leg heals completely. Also, we need to consider the neurological deficits in her legs. 4. I will see her back in 6 months time.

## 2013-06-22 ENCOUNTER — Ambulatory Visit: Payer: Medicare Other | Attending: Physical Medicine & Rehabilitation | Admitting: Physical Therapy

## 2013-06-22 DIAGNOSIS — IMO0001 Reserved for inherently not codable concepts without codable children: Secondary | ICD-10-CM | POA: Insufficient documentation

## 2013-06-22 DIAGNOSIS — M6281 Muscle weakness (generalized): Secondary | ICD-10-CM | POA: Insufficient documentation

## 2013-06-22 DIAGNOSIS — S88119A Complete traumatic amputation at level between knee and ankle, unspecified lower leg, initial encounter: Secondary | ICD-10-CM | POA: Insufficient documentation

## 2013-06-23 ENCOUNTER — Encounter (HOSPITAL_BASED_OUTPATIENT_CLINIC_OR_DEPARTMENT_OTHER): Payer: Medicare Other | Attending: General Surgery

## 2013-06-23 DIAGNOSIS — L97409 Non-pressure chronic ulcer of unspecified heel and midfoot with unspecified severity: Secondary | ICD-10-CM | POA: Insufficient documentation

## 2013-06-23 DIAGNOSIS — E1169 Type 2 diabetes mellitus with other specified complication: Secondary | ICD-10-CM | POA: Insufficient documentation

## 2013-07-19 ENCOUNTER — Encounter: Payer: Medicare Other | Attending: Physical Medicine & Rehabilitation | Admitting: Physical Medicine & Rehabilitation

## 2013-07-19 ENCOUNTER — Encounter: Payer: Self-pay | Admitting: Physical Medicine & Rehabilitation

## 2013-07-19 VITALS — BP 133/56 | HR 96 | Resp 16 | Ht 59.0 in | Wt 161.0 lb

## 2013-07-19 DIAGNOSIS — S78119A Complete traumatic amputation at level between unspecified hip and knee, initial encounter: Secondary | ICD-10-CM

## 2013-07-19 DIAGNOSIS — Z89611 Acquired absence of right leg above knee: Secondary | ICD-10-CM

## 2013-07-19 DIAGNOSIS — G959 Disease of spinal cord, unspecified: Secondary | ICD-10-CM

## 2013-07-19 DIAGNOSIS — Z79899 Other long term (current) drug therapy: Secondary | ICD-10-CM

## 2013-07-19 DIAGNOSIS — M109 Gout, unspecified: Secondary | ICD-10-CM

## 2013-07-19 DIAGNOSIS — G9581 Conus medullaris syndrome: Secondary | ICD-10-CM

## 2013-07-19 DIAGNOSIS — A419 Sepsis, unspecified organism: Secondary | ICD-10-CM

## 2013-07-19 DIAGNOSIS — Z5181 Encounter for therapeutic drug level monitoring: Secondary | ICD-10-CM

## 2013-07-19 NOTE — Patient Instructions (Signed)
CALL ME WITH ANY PROBLEMS OR QUESTIONS (#[phone]).  HAVE A GOOD DAY  

## 2013-07-19 NOTE — Progress Notes (Signed)
Subjective:    Patient ID: Alexandra Wiley, female    DOB: June 16, 1946, 67 y.o.   MRN: [id number]  HPI  Alexandra Wiley is back regarding her chronic pain and conus syndrome. Her left foot continues to heal. The main purpose of this visit today is for a wheelchair evaluation.   Because of her spinal cord injury and subsequent paraplegia, right AKA, and chronic left foot wound, Alexandra Wiley has been left with substantial disability. She cannot mobilize without a powered wheelchair or without someone else propelling a manual one. Her conditions make toileting, dressing, and grooming very difficult as well, given the impact on her overall mobility. Alexandra Wiley is unable to use a cane or walker due to the magnitude of her neurological injury as well as the limb limitationa her amputation and chronic left foot wound have caused. She is unable to utilize a manual wheel chair at home due to limited cardiopulmonary endurance, neurogenic trunk and leg weakness, chronic pain, and chronic wound issues. She has fairly good UE strength but tends to fatigue quickly. (See exam below). Additionally, she lacks truncal control and has an altered center of gravity which would make the use of a scooter dangerous. She would be at risk for falls while sitting and certainly with transferring from a scooter.  Pain Inventory Average Pain 7 Pain Right Now 5 My pain is sharp, burning and tingling  In the last 24 hours, has pain interfered with the following? General activity 7 Relation with others 0 Enjoyment of life 0 What TIME of day is your pain at its worst? morning and night Sleep (in general) Fair  Pain is worse with: some activites Pain improves with: medication Relief from Meds: 5  Mobility ability to climb steps?  no do you drive?  no use a wheelchair transfers alone  Function retired I need assistance with the following:  toileting, household duties and shopping  Neuro/Psych bladder control  problems bowel control problems loss of taste or smell  Prior Studies Any changes since last visit?  no  Physicians involved in your care Any changes since last visit?  no   Family History  Problem Relation Age of Onset  . Arthritis Mother   . Hypertension Mother   . Heart disease Mother     before age 23  . Hyperlipidemia Mother   . Other Mother     varicose veins  . Heart attack Mother   . Heart disease Father   . Diabetes Father   . Hypertension Sister   . Hyperlipidemia Sister   . Other Sister     varicose veins  . Hypertension Son   . Heart attack Son   . Diabetes Brother   . Heart disease Brother   . Hypertension Brother   . Diabetes Daughter   . Hyperlipidemia Daughter   . Heart disease Daughter     before age 21  . Other Daughter     varicose veins   History   Social History  . Marital Status: Married    Spouse Name: N/A    Number of Children: N/A  . Years of Education: N/A   Social History Main Topics  . Smoking status: Current Every Day Smoker -- 0.50 packs/day for 25 years  . Smokeless tobacco: Never Used     Comment: pt states that she is not intrested in support group because she would not be able to get there  . Alcohol Use: No     Comment: 02/02/12 "last  alcohol ~ 2008"  . Drug Use: No  . Sexual Activity: No   Other Topics Concern  . None   Social History Narrative  . None   Past Surgical History  Procedure Laterality Date  . Foot tendon surgery  2000    Left foot  . Tubal ligation    . Femur fracture surgery  1960's    Left leg, repair of non-union femur fracture by Dr. Orland Jarred  . Breast lumpectomy  ~1975    Right breast carcinoma in situ  . Tonsillectomy and adenoidectomy  ~ 1961  . Dilation and curettage of uterus    . Fracture surgery    . Amputation  02/05/2012    Procedure: AMPUTATION BELOW KNEE;  Surgeon: Larina Earthly, MD;  Location: [facility] OR;  Service: Vascular;  Laterality: Right;  right  above knee amputation  . Right leg  amputation  01/2012   Past Medical History  Diagnosis Date  . Ulcer   . Hypertension   . Heart murmur   . Gout   . PVD (peripheral vascular disease)   . Pain in limb   . CHF (congestive heart failure)   . 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 Conus medullaris syndrome   . Arthritis   . Breast cancer ~ 1975    right  . Carpal tunnel syndrome of right wrist   . High cholesterol   . Angina   . Myocardial infarction 1996  . Pneumonia ~ 2002; ~1975  . Type II diabetes mellitus   . Exertional dyspnea   . Hypothyroidism   . Seizures 02/02/12    "used to; a long time ago"  . Stroke 10/2008    "spinal cord"  . Neurogenic bladder   . Irregular heartbeat    BP 133/56  Pulse 96  Resp 16  Ht 4\' 11"  (1.499 m)  Wt 161 lb (73.029 kg)  BMI 32.5 kg/m2  SpO2 98%     Review of Systems  Constitutional: Positive for appetite change and unexpected weight change.  Gastrointestinal: Positive for diarrhea and constipation.  Genitourinary: Positive for difficulty urinating.  All other systems reviewed and are negative.       Objective:   Physical Exam General: Alert and oriented x 3, No apparent distress  HEENT: Head is normocephalic, atraumatic, PERRLA, EOMI, sclera anicteric, oral mucosa pink and moist, dentition intact, ext ear canals clear,  Neck: Supple without JVD or lymphadenopathy  Heart: Reg rate and rhythm. No murmurs rubs or gallops  Chest: CTA bilaterally without wheezes, rales, or rhonchi; no distress  Abdomen: Soft, non-tender, non-distended, bowel sounds positive.  Extremities: No clubbing, cyanosis, or edema. Pulses are 2+  Skin: Right AKA site intact. LLE notable for. Right aka is well healed. The left foot is wrapped with Cob[place]. Right AKA is well healed--.  Marland KitchenNeuro: pt is alert and oriented. Sensory deficits below the waist. Uses chair to help support trunk. She has KE on either leg to about 3/5. HF is grossly 2+ to 3/5 as well. UE strength is 5/5 with poor  stamina---had difficulty keeping arms elevated after 15-20 seconds. Musculoskeletal: She has tenderness and valgus deformity at the left knee.  Psych: Pt's affect is appropriate. Pt is cooperative    Assessment & Plan:   1. Conus medullaris syndrome with paraplegia  2. Neurogenic bowel and bladder  3. Osteoarthritis left knee  4. History of gout with acute flare affecting left knee today.  5. Right AKA  6. Necrotic left foot- with substantial healing    Plan:  1. It is my opinion that Alexandra Wiley is appropriate for a powered wheelchair so that she can regain functional independence within her home. She requires a custom chair with adjustable arm rests to allow adequate trunk support and to help keep her buttocks off loaded and to facilitate transfers. She also requires elevated leg rest to assist with the care of her left lower extremity edema and wound. The patient is mentally and physically capable of operating a powered wheelchair. Additionally, I concur with physical therapy's detailed wheel chair and assessment.  2. For pain, continue with oxycodone 5mg  #60, and voltaren gel which she will use on her left knee. She is using this rarely at this point.  3. Wheelchair paperwork was completed today.  4. I will see her back in about 5- 6 months time.

## 2013-07-28 ENCOUNTER — Encounter (HOSPITAL_BASED_OUTPATIENT_CLINIC_OR_DEPARTMENT_OTHER): Payer: Medicare Other | Attending: General Surgery

## 2013-07-28 DIAGNOSIS — L89609 Pressure ulcer of unspecified heel, unspecified stage: Secondary | ICD-10-CM | POA: Insufficient documentation

## 2013-07-28 DIAGNOSIS — L8993 Pressure ulcer of unspecified site, stage 3: Secondary | ICD-10-CM | POA: Insufficient documentation

## 2013-09-01 ENCOUNTER — Encounter (HOSPITAL_BASED_OUTPATIENT_CLINIC_OR_DEPARTMENT_OTHER): Payer: Medicare Other | Attending: General Surgery

## 2013-09-01 DIAGNOSIS — L8993 Pressure ulcer of unspecified site, stage 3: Secondary | ICD-10-CM | POA: Insufficient documentation

## 2013-09-01 DIAGNOSIS — L89609 Pressure ulcer of unspecified heel, unspecified stage: Secondary | ICD-10-CM | POA: Insufficient documentation

## 2013-09-22 ENCOUNTER — Encounter (HOSPITAL_BASED_OUTPATIENT_CLINIC_OR_DEPARTMENT_OTHER): Payer: Medicare Other | Attending: General Surgery

## 2013-09-22 DIAGNOSIS — L8993 Pressure ulcer of unspecified site, stage 3: Secondary | ICD-10-CM | POA: Insufficient documentation

## 2013-09-22 DIAGNOSIS — E1169 Type 2 diabetes mellitus with other specified complication: Secondary | ICD-10-CM | POA: Insufficient documentation

## 2013-09-22 DIAGNOSIS — L89609 Pressure ulcer of unspecified heel, unspecified stage: Secondary | ICD-10-CM | POA: Insufficient documentation

## 2013-09-22 DIAGNOSIS — L97409 Non-pressure chronic ulcer of unspecified heel and midfoot with unspecified severity: Secondary | ICD-10-CM | POA: Insufficient documentation

## 2013-10-27 ENCOUNTER — Encounter (HOSPITAL_BASED_OUTPATIENT_CLINIC_OR_DEPARTMENT_OTHER): Payer: Medicare Other | Attending: General Surgery

## 2013-10-27 DIAGNOSIS — L97409 Non-pressure chronic ulcer of unspecified heel and midfoot with unspecified severity: Secondary | ICD-10-CM | POA: Insufficient documentation

## 2013-10-27 DIAGNOSIS — E1169 Type 2 diabetes mellitus with other specified complication: Secondary | ICD-10-CM | POA: Insufficient documentation

## 2013-10-31 ENCOUNTER — Encounter: Payer: Self-pay | Admitting: Internal Medicine

## 2013-11-22 ENCOUNTER — Encounter: Payer: Medicare Other | Attending: Physical Medicine & Rehabilitation | Admitting: Physical Medicine & Rehabilitation

## 2013-11-22 ENCOUNTER — Encounter: Payer: Self-pay | Admitting: Physical Medicine & Rehabilitation

## 2013-11-22 VITALS — BP 135/72 | HR 115 | Resp 14 | Ht 59.0 in | Wt 161.0 lb

## 2013-11-22 DIAGNOSIS — I509 Heart failure, unspecified: Secondary | ICD-10-CM | POA: Insufficient documentation

## 2013-11-22 DIAGNOSIS — G8220 Paraplegia, unspecified: Secondary | ICD-10-CM

## 2013-11-22 DIAGNOSIS — G9581 Conus medullaris syndrome: Secondary | ICD-10-CM

## 2013-11-22 DIAGNOSIS — I739 Peripheral vascular disease, unspecified: Secondary | ICD-10-CM

## 2013-11-22 DIAGNOSIS — F17200 Nicotine dependence, unspecified, uncomplicated: Secondary | ICD-10-CM | POA: Insufficient documentation

## 2013-11-22 DIAGNOSIS — K592 Neurogenic bowel, not elsewhere classified: Secondary | ICD-10-CM | POA: Insufficient documentation

## 2013-11-22 DIAGNOSIS — S78119A Complete traumatic amputation at level between unspecified hip and knee, initial encounter: Secondary | ICD-10-CM | POA: Insufficient documentation

## 2013-11-22 DIAGNOSIS — E039 Hypothyroidism, unspecified: Secondary | ICD-10-CM | POA: Insufficient documentation

## 2013-11-22 DIAGNOSIS — G959 Disease of spinal cord, unspecified: Secondary | ICD-10-CM

## 2013-11-22 DIAGNOSIS — I252 Old myocardial infarction: Secondary | ICD-10-CM | POA: Insufficient documentation

## 2013-11-22 DIAGNOSIS — Z89619 Acquired absence of unspecified leg above knee: Secondary | ICD-10-CM

## 2013-11-22 DIAGNOSIS — G9589 Other specified diseases of spinal cord: Secondary | ICD-10-CM | POA: Insufficient documentation

## 2013-11-22 DIAGNOSIS — IMO0002 Reserved for concepts with insufficient information to code with codable children: Secondary | ICD-10-CM

## 2013-11-22 DIAGNOSIS — M109 Gout, unspecified: Secondary | ICD-10-CM | POA: Insufficient documentation

## 2013-11-22 DIAGNOSIS — E7800 Pure hypercholesterolemia, unspecified: Secondary | ICD-10-CM | POA: Insufficient documentation

## 2013-11-22 DIAGNOSIS — I10 Essential (primary) hypertension: Secondary | ICD-10-CM | POA: Insufficient documentation

## 2013-11-22 DIAGNOSIS — M1710 Unilateral primary osteoarthritis, unspecified knee: Secondary | ICD-10-CM

## 2013-11-22 DIAGNOSIS — N319 Neuromuscular dysfunction of bladder, unspecified: Secondary | ICD-10-CM | POA: Insufficient documentation

## 2013-11-22 DIAGNOSIS — E119 Type 2 diabetes mellitus without complications: Secondary | ICD-10-CM | POA: Insufficient documentation

## 2013-11-22 MED ORDER — OXYCODONE HCL 5 MG PO CAPS: 5.0000 mg | ORAL_CAPSULE | Freq: Four times a day (QID) | ORAL | Status: AC | PRN

## 2013-11-22 NOTE — Progress Notes (Signed)
Subjective:    Patient ID: Alexandra Wiley, female    DOB: 02/29/1946, 68 y.o.   MRN: [id number]  HPI  Alexandra Wiley is back regarding her multiple issues. She finally got her powered wheelchair which is working well. Her pain is fairly well controlled. She is using her oxycodone once in awhile.   She hasn't had any recent bladder infections. She has manual exctraction of stool 3 days per wek.   Her skin is intact except for her left heel which opened up after someone stepped on her foot. She is still followed by the wound care clinic.   Pain Inventory Average Pain 6 Pain Right Now 4 My pain is sharp and burning  In the last 24 hours, has pain interfered with the following? General activity 5 Relation with others 5 Enjoyment of life 6 What TIME of day is your pain at its worst? morning and night Sleep (in general) Fair  Pain is worse with: some activites Pain improves with: medication Relief from Meds: 5  Mobility ability to climb steps?  no do you drive?  no use a wheelchair transfers alone Do you have any goals in this area?  yes  Function disabled: date disabled . I need assistance with the following:  bathing, toileting, household duties and shopping  Neuro/Psych tremor tingling spasms  Prior Studies Any changes since last visit?  no  Physicians involved in your care Primary care .   Family History  Problem Relation Age of Onset  . Arthritis Mother   . Hypertension Mother   . Heart disease Mother     before age 87  . Hyperlipidemia Mother   . Other Mother     varicose veins  . Heart attack Mother   . Heart disease Father   . Diabetes Father   . Hypertension Sister   . Hyperlipidemia Sister   . Other Sister     varicose veins  . Hypertension Son   . Heart attack Son   . Diabetes Brother   . Heart disease Brother   . Hypertension Brother   . Diabetes Daughter   . Hyperlipidemia Daughter   . Heart disease Daughter     before age 37  . Other  Daughter     varicose veins   History   Social History  . Marital Status: Married    Spouse Name: N/A    Number of Children: N/A  . Years of Education: N/A   Social History Main Topics  . Smoking status: Current Every Day Smoker -- 0.50 packs/day for 25 years  . Smokeless tobacco: Never Used     Comment: pt states that she is not intrested in support group because she would not be able to get there  . Alcohol Use: No     Comment: 02/02/12 "last alcohol ~ 2008"  . Drug Use: No  . Sexual Activity: No   Other Topics Concern  . None   Social History Narrative  . None   Past Surgical History  Procedure Laterality Date  . Foot tendon surgery  2000    Left foot  . Tubal ligation    . Femur fracture surgery  1960's    Left leg, repair of non-union femur fracture by Dr. Elvina Mattes  . Breast lumpectomy  ~1975    Right breast carcinoma in situ  . Tonsillectomy and adenoidectomy  ~ 1961  . Dilation and curettage of uterus    . Fracture surgery    . Amputation  02/05/2012    Procedure: AMPUTATION BELOW KNEE;  Surgeon: Rosetta Posner, MD;  Location: [facility] OR;  Service: Vascular;  Laterality: Right;  right  above knee amputation  . Right leg amputation  01/2012   Past Medical History  Diagnosis Date  . Ulcer   . Hypertension   . Heart murmur   . Gout   . PVD (peripheral vascular disease)   . Pain in limb   . CHF (congestive heart failure)   . 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 Conus medullaris syndrome   . Arthritis   . Breast cancer ~ 1975    right  . Carpal tunnel syndrome of right wrist   . High cholesterol   . Angina   . Myocardial infarction 1996  . Pneumonia ~ 2002; ~1975  . Type II diabetes mellitus   . Exertional dyspnea   . Hypothyroidism   . Seizures 02/02/12    "used to; a long time ago"  . Stroke 10/2008    "spinal cord"  . Neurogenic bladder   . Irregular heartbeat    BP 135/72  Pulse 115  Resp 14  Ht 4\' 11"  (1.499 m)  Wt 161 lb (73.029 kg)  BMI  32.50 kg/m2  SpO2 99%  Opioid Risk Score:   Fall Risk Score: High Fall Risk (>13 points) (patient educated handout declined)   Review of Systems  Neurological: Positive for tremors.       Spasm, tingling  All other systems reviewed and are negative.       Objective:   Physical Exam  General: Alert and oriented x 3, No apparent distress  HEENT: Head is normocephalic, atraumatic, PERRLA, EOMI, sclera anicteric, oral mucosa pink and moist, dentition intact, ext ear canals clear,  Neck: Supple without JVD or lymphadenopathy  Heart: Reg rate and rhythm. No murmurs rubs or gallops  Chest: CTA bilaterally without wheezes, rales, or rhonchi; no distress  Abdomen: Soft, non-tender, non-distended, bowel sounds positive.  Extremities: No clubbing, cyanosis, or edema. Pulses are 2+  Skin: Right AKA site intact. . Right aka is well healed. The left foot is wrapped with gauze which I didn't removed. Right AKA is well healed--.  Marland KitchenNeuro: pt is alert and oriented. Sensory deficits below the waist. Uses chair to help support trunk. She has KE on either leg to about 3/5. HF is grossly 2+ to 3/5 as well. UE strength is 5/5--much improved today.  Musculoskeletal: She has tenderness and valgus deformity at the left knee.  Psych: Pt's affect is appropriate. Pt is cooperative    Assessment & Plan:   1. Conus medullaris syndrome with paraplegia  2. Neurogenic bowel and bladder  3. Osteoarthritis left knee  4. History of gout with acute flare affecting left knee today.  5. Right AKA  6. Necrotic left foot- with substantial healing   Plan:  1. Wound care per W[facility] care clinic at [facility].   2. For pain, continue with oxycodone 5mg  #60, and voltaren gel which she will use on her left knee. She is using this rarely at this point.  3. Wheelchair paperwork was completed today.  4. I will see her back in about 5- 6 months time

## 2013-11-22 NOTE — Patient Instructions (Signed)
PLEASE CALL ME WITH ANY PROBLEMS OR QUESTIONS (#[phone]).      

## 2013-11-24 ENCOUNTER — Encounter (HOSPITAL_BASED_OUTPATIENT_CLINIC_OR_DEPARTMENT_OTHER): Payer: Medicare Other | Attending: General Surgery

## 2013-11-24 DIAGNOSIS — E1169 Type 2 diabetes mellitus with other specified complication: Secondary | ICD-10-CM | POA: Insufficient documentation

## 2013-11-24 DIAGNOSIS — L97409 Non-pressure chronic ulcer of unspecified heel and midfoot with unspecified severity: Secondary | ICD-10-CM | POA: Insufficient documentation

## 2013-11-30 ENCOUNTER — Encounter: Payer: Self-pay | Admitting: Internal Medicine

## 2013-12-22 ENCOUNTER — Encounter (HOSPITAL_BASED_OUTPATIENT_CLINIC_OR_DEPARTMENT_OTHER): Payer: Medicare Other | Attending: General Surgery

## 2013-12-22 DIAGNOSIS — E1169 Type 2 diabetes mellitus with other specified complication: Secondary | ICD-10-CM | POA: Insufficient documentation

## 2013-12-22 DIAGNOSIS — L97409 Non-pressure chronic ulcer of unspecified heel and midfoot with unspecified severity: Secondary | ICD-10-CM | POA: Insufficient documentation

## 2014-01-19 ENCOUNTER — Encounter (HOSPITAL_BASED_OUTPATIENT_CLINIC_OR_DEPARTMENT_OTHER): Payer: Medicare Other | Attending: General Surgery

## 2014-01-19 DIAGNOSIS — L97409 Non-pressure chronic ulcer of unspecified heel and midfoot with unspecified severity: Secondary | ICD-10-CM | POA: Insufficient documentation

## 2014-01-19 DIAGNOSIS — E1169 Type 2 diabetes mellitus with other specified complication: Secondary | ICD-10-CM | POA: Insufficient documentation

## 2014-02-02 LAB — GLUCOSE, CAPILLARY: GLUCOSE-CAPILLARY: 89 mg/dL (ref 70–99)

## 2014-02-19 ENCOUNTER — Other Ambulatory Visit: Payer: Self-pay | Admitting: Physical Medicine & Rehabilitation

## 2014-02-23 ENCOUNTER — Encounter (HOSPITAL_BASED_OUTPATIENT_CLINIC_OR_DEPARTMENT_OTHER): Payer: Medicare Other | Attending: General Surgery

## 2014-02-23 DIAGNOSIS — E1169 Type 2 diabetes mellitus with other specified complication: Secondary | ICD-10-CM | POA: Insufficient documentation

## 2014-02-23 DIAGNOSIS — L97409 Non-pressure chronic ulcer of unspecified heel and midfoot with unspecified severity: Secondary | ICD-10-CM | POA: Insufficient documentation

## 2014-03-30 ENCOUNTER — Encounter (HOSPITAL_BASED_OUTPATIENT_CLINIC_OR_DEPARTMENT_OTHER): Payer: Medicare Other | Attending: General Surgery

## 2014-03-30 DIAGNOSIS — L89609 Pressure ulcer of unspecified heel, unspecified stage: Secondary | ICD-10-CM | POA: Diagnosis not present

## 2014-03-30 DIAGNOSIS — L8993 Pressure ulcer of unspecified site, stage 3: Secondary | ICD-10-CM | POA: Diagnosis not present

## 2014-04-06 DIAGNOSIS — L8993 Pressure ulcer of unspecified site, stage 3: Secondary | ICD-10-CM | POA: Diagnosis not present

## 2014-04-06 DIAGNOSIS — L89609 Pressure ulcer of unspecified heel, unspecified stage: Secondary | ICD-10-CM | POA: Diagnosis not present

## 2014-04-13 DIAGNOSIS — L8993 Pressure ulcer of unspecified site, stage 3: Secondary | ICD-10-CM | POA: Diagnosis not present

## 2014-04-13 DIAGNOSIS — L89609 Pressure ulcer of unspecified heel, unspecified stage: Secondary | ICD-10-CM | POA: Diagnosis not present

## 2014-04-20 DIAGNOSIS — L89609 Pressure ulcer of unspecified heel, unspecified stage: Secondary | ICD-10-CM | POA: Diagnosis not present

## 2014-04-20 DIAGNOSIS — L8993 Pressure ulcer of unspecified site, stage 3: Secondary | ICD-10-CM | POA: Diagnosis not present

## 2014-04-27 ENCOUNTER — Encounter (HOSPITAL_BASED_OUTPATIENT_CLINIC_OR_DEPARTMENT_OTHER): Payer: Medicare Other | Attending: General Surgery

## 2014-04-27 DIAGNOSIS — L89609 Pressure ulcer of unspecified heel, unspecified stage: Secondary | ICD-10-CM | POA: Insufficient documentation

## 2014-04-27 DIAGNOSIS — L8993 Pressure ulcer of unspecified site, stage 3: Secondary | ICD-10-CM | POA: Diagnosis not present

## 2014-05-04 DIAGNOSIS — L89609 Pressure ulcer of unspecified heel, unspecified stage: Secondary | ICD-10-CM | POA: Diagnosis not present

## 2014-05-04 DIAGNOSIS — L8993 Pressure ulcer of unspecified site, stage 3: Secondary | ICD-10-CM | POA: Diagnosis not present

## 2014-05-11 DIAGNOSIS — L89609 Pressure ulcer of unspecified heel, unspecified stage: Secondary | ICD-10-CM | POA: Diagnosis not present

## 2014-05-11 DIAGNOSIS — L8993 Pressure ulcer of unspecified site, stage 3: Secondary | ICD-10-CM | POA: Diagnosis not present

## 2014-05-18 DIAGNOSIS — L89609 Pressure ulcer of unspecified heel, unspecified stage: Secondary | ICD-10-CM | POA: Diagnosis not present

## 2014-05-21 ENCOUNTER — Encounter: Payer: Self-pay | Admitting: Vascular Surgery

## 2014-05-21 ENCOUNTER — Other Ambulatory Visit: Payer: Self-pay | Admitting: *Deleted

## 2014-05-21 DIAGNOSIS — I739 Peripheral vascular disease, unspecified: Secondary | ICD-10-CM

## 2014-05-22 ENCOUNTER — Ambulatory Visit: Payer: Medicare Other | Admitting: Vascular Surgery

## 2014-05-22 ENCOUNTER — Encounter (HOSPITAL_COMMUNITY): Payer: Medicare Other

## 2014-05-25 ENCOUNTER — Encounter (HOSPITAL_BASED_OUTPATIENT_CLINIC_OR_DEPARTMENT_OTHER): Payer: Medicare Other | Attending: General Surgery

## 2014-05-25 DIAGNOSIS — L97409 Non-pressure chronic ulcer of unspecified heel and midfoot with unspecified severity: Secondary | ICD-10-CM | POA: Insufficient documentation

## 2014-05-25 DIAGNOSIS — E1169 Type 2 diabetes mellitus with other specified complication: Secondary | ICD-10-CM | POA: Diagnosis not present

## 2014-06-01 DIAGNOSIS — E1169 Type 2 diabetes mellitus with other specified complication: Secondary | ICD-10-CM | POA: Diagnosis not present

## 2014-06-01 DIAGNOSIS — L97409 Non-pressure chronic ulcer of unspecified heel and midfoot with unspecified severity: Secondary | ICD-10-CM | POA: Diagnosis not present

## 2014-06-08 DIAGNOSIS — E1169 Type 2 diabetes mellitus with other specified complication: Secondary | ICD-10-CM | POA: Diagnosis not present

## 2014-06-08 DIAGNOSIS — L97409 Non-pressure chronic ulcer of unspecified heel and midfoot with unspecified severity: Secondary | ICD-10-CM | POA: Diagnosis not present

## 2014-06-15 DIAGNOSIS — L97409 Non-pressure chronic ulcer of unspecified heel and midfoot with unspecified severity: Secondary | ICD-10-CM | POA: Diagnosis not present

## 2014-06-15 DIAGNOSIS — E1169 Type 2 diabetes mellitus with other specified complication: Secondary | ICD-10-CM | POA: Diagnosis not present

## 2014-06-18 ENCOUNTER — Encounter: Payer: Self-pay | Admitting: Vascular Surgery

## 2014-06-19 ENCOUNTER — Ambulatory Visit (INDEPENDENT_AMBULATORY_CARE_PROVIDER_SITE_OTHER): Payer: Medicare Other | Admitting: Vascular Surgery

## 2014-06-19 ENCOUNTER — Ambulatory Visit (INDEPENDENT_AMBULATORY_CARE_PROVIDER_SITE_OTHER)
Admission: RE | Admit: 2014-06-19 | Discharge: 2014-06-19 | Disposition: A | Discharge status: Home / Self Care | Payer: Medicare Other | Source: Ambulatory Visit | Attending: Vascular Surgery | Admitting: Vascular Surgery

## 2014-06-19 ENCOUNTER — Encounter: Payer: Self-pay | Admitting: Vascular Surgery

## 2014-06-19 VITALS — BP 108/53 | HR 110 | Ht 59.0 in | Wt 161.0 lb

## 2014-06-19 DIAGNOSIS — I739 Peripheral vascular disease, unspecified: Secondary | ICD-10-CM

## 2014-06-19 DIAGNOSIS — S78119A Complete traumatic amputation at level between unspecified hip and knee, initial encounter: Secondary | ICD-10-CM | POA: Insufficient documentation

## 2014-06-19 DIAGNOSIS — L98499 Non-pressure chronic ulcer of skin of other sites with unspecified severity: Principal | ICD-10-CM

## 2014-06-19 DIAGNOSIS — E785 Hyperlipidemia, unspecified: Secondary | ICD-10-CM | POA: Insufficient documentation

## 2014-06-19 DIAGNOSIS — R531 Weakness: Secondary | ICD-10-CM | POA: Diagnosis not present

## 2014-06-19 DIAGNOSIS — L97409 Non-pressure chronic ulcer of unspecified heel and midfoot with unspecified severity: Secondary | ICD-10-CM

## 2014-06-19 DIAGNOSIS — E119 Type 2 diabetes mellitus without complications: Secondary | ICD-10-CM

## 2014-06-19 DIAGNOSIS — A4151 Sepsis due to Escherichia coli [E. coli]: Secondary | ICD-10-CM | POA: Diagnosis not present

## 2014-06-19 DIAGNOSIS — I10 Essential (primary) hypertension: Secondary | ICD-10-CM | POA: Insufficient documentation

## 2014-06-19 NOTE — Progress Notes (Signed)
   Office Progress Note  History of Present Illness  Alexandra Wiley is a 68 y.o. (1945-10-17) female who presents for continued follow up of her left foot ulcer. She was last seen on 10/18/12 for evaluation of the same left foot ulcer. She has been going to the wound care center and the ulcer initially improved but has since worsened. She has right above the knee amputation. She denies any pain.   The patient's PMH, PSH, SH, FamHx, Med, and Allergies are unchanged from 10/18/2012.  Physical Examination  Filed Vitals:   06/19/14 1400  BP: 108/53  Pulse: 110  Height: 4\' 11"  (1.499 m)  Weight: 161 lb (73.029 kg)  SpO2: 100%   Body mass index is 32.5 kg/(m^2).  General: A&O x 3 in NAD Vascular: right AKA, left heel ulcer with dry eschar Extremities: no swelling of lower extremities. No ischemic changes.    Medical Decision Making  Alexandra Wiley is a 68 y.o. female who presents with slowly healing wound of left heel. She is s/p right AKA. She is not at risk for limb loss. She previously had a spinal cord stroke and has no motor function of her left lower extremity. Due to her non-ambulatory status, she would benefit from an amputation for definitive treatment and would not benefit from any revascularization procedures. She will return to the wound center for treatment. She will follow up as needed.    Virgina Jock, PA-C [facility] Office: [phone] Pager: [phone]  06/19/2014, 2:48 PM   This patient was seen in conjunction with Dr. Donnetta Hutching  I have examined the patient, reviewed and agree with above. No invasive infection currently. No option other than of amputation if she has progressive ischemic changes.  Ilaisaane Marts, MD 06/19/2014 5:15 PM

## 2014-06-20 ENCOUNTER — Encounter (HOSPITAL_COMMUNITY): Payer: Self-pay | Admitting: Emergency Medicine

## 2014-06-20 ENCOUNTER — Emergency Department (HOSPITAL_COMMUNITY): Payer: Medicare Other

## 2014-06-20 ENCOUNTER — Inpatient Hospital Stay (HOSPITAL_COMMUNITY): Payer: Medicare Other

## 2014-06-20 ENCOUNTER — Inpatient Hospital Stay (HOSPITAL_COMMUNITY)
Admission: EM | Admit: 2014-06-20 | Discharge: 2014-07-22 | DRG: 870 | Disposition: E | Payer: Medicare Other | Attending: Internal Medicine | Admitting: Internal Medicine

## 2014-06-20 DIAGNOSIS — D696 Thrombocytopenia, unspecified: Secondary | ICD-10-CM | POA: Diagnosis present

## 2014-06-20 DIAGNOSIS — Z794 Long term (current) use of insulin: Secondary | ICD-10-CM

## 2014-06-20 DIAGNOSIS — N179 Acute kidney failure, unspecified: Secondary | ICD-10-CM | POA: Diagnosis present

## 2014-06-20 DIAGNOSIS — F17210 Nicotine dependence, cigarettes, uncomplicated: Secondary | ICD-10-CM | POA: Diagnosis present

## 2014-06-20 DIAGNOSIS — R6520 Severe sepsis without septic shock: Secondary | ICD-10-CM

## 2014-06-20 DIAGNOSIS — K559 Vascular disorder of intestine, unspecified: Secondary | ICD-10-CM | POA: Diagnosis present

## 2014-06-20 DIAGNOSIS — L97429 Non-pressure chronic ulcer of left heel and midfoot with unspecified severity: Secondary | ICD-10-CM | POA: Diagnosis present

## 2014-06-20 DIAGNOSIS — G8220 Paraplegia, unspecified: Secondary | ICD-10-CM | POA: Diagnosis present

## 2014-06-20 DIAGNOSIS — I248 Other forms of acute ischemic heart disease: Secondary | ICD-10-CM | POA: Diagnosis present

## 2014-06-20 DIAGNOSIS — I5032 Chronic diastolic (congestive) heart failure: Secondary | ICD-10-CM | POA: Diagnosis present

## 2014-06-20 DIAGNOSIS — N390 Urinary tract infection, site not specified: Secondary | ICD-10-CM | POA: Diagnosis present

## 2014-06-20 DIAGNOSIS — K5641 Fecal impaction: Secondary | ICD-10-CM | POA: Diagnosis present

## 2014-06-20 DIAGNOSIS — Z993 Dependence on wheelchair: Secondary | ICD-10-CM

## 2014-06-20 DIAGNOSIS — J9600 Acute respiratory failure, unspecified whether with hypoxia or hypercapnia: Secondary | ICD-10-CM

## 2014-06-20 DIAGNOSIS — A4151 Sepsis due to Escherichia coli [E. coli]: Principal | ICD-10-CM | POA: Diagnosis present

## 2014-06-20 DIAGNOSIS — E872 Acidosis: Secondary | ICD-10-CM | POA: Diagnosis present

## 2014-06-20 DIAGNOSIS — I214 Non-ST elevation (NSTEMI) myocardial infarction: Secondary | ICD-10-CM | POA: Diagnosis present

## 2014-06-20 DIAGNOSIS — E871 Hypo-osmolality and hyponatremia: Secondary | ICD-10-CM | POA: Diagnosis present

## 2014-06-20 DIAGNOSIS — E1165 Type 2 diabetes mellitus with hyperglycemia: Secondary | ICD-10-CM | POA: Diagnosis present

## 2014-06-20 DIAGNOSIS — Z89611 Acquired absence of right leg above knee: Secondary | ICD-10-CM

## 2014-06-20 DIAGNOSIS — N178 Other acute kidney failure: Secondary | ICD-10-CM | POA: Diagnosis present

## 2014-06-20 DIAGNOSIS — E869 Volume depletion, unspecified: Secondary | ICD-10-CM | POA: Diagnosis present

## 2014-06-20 DIAGNOSIS — D509 Iron deficiency anemia, unspecified: Secondary | ICD-10-CM | POA: Diagnosis present

## 2014-06-20 DIAGNOSIS — N319 Neuromuscular dysfunction of bladder, unspecified: Secondary | ICD-10-CM | POA: Diagnosis present

## 2014-06-20 DIAGNOSIS — A419 Sepsis, unspecified organism: Secondary | ICD-10-CM

## 2014-06-20 DIAGNOSIS — G9340 Encephalopathy, unspecified: Secondary | ICD-10-CM | POA: Diagnosis present

## 2014-06-20 DIAGNOSIS — G8929 Other chronic pain: Secondary | ICD-10-CM | POA: Diagnosis present

## 2014-06-20 DIAGNOSIS — E039 Hypothyroidism, unspecified: Secondary | ICD-10-CM | POA: Diagnosis present

## 2014-06-20 DIAGNOSIS — J189 Pneumonia, unspecified organism: Secondary | ICD-10-CM | POA: Diagnosis present

## 2014-06-20 DIAGNOSIS — I959 Hypotension, unspecified: Secondary | ICD-10-CM

## 2014-06-20 DIAGNOSIS — E876 Hypokalemia: Secondary | ICD-10-CM | POA: Diagnosis present

## 2014-06-20 DIAGNOSIS — Y95 Nosocomial condition: Secondary | ICD-10-CM | POA: Diagnosis present

## 2014-06-20 DIAGNOSIS — R531 Weakness: Secondary | ICD-10-CM | POA: Diagnosis present

## 2014-06-20 DIAGNOSIS — R34 Anuria and oliguria: Secondary | ICD-10-CM | POA: Diagnosis present

## 2014-06-20 DIAGNOSIS — I739 Peripheral vascular disease, unspecified: Secondary | ICD-10-CM | POA: Diagnosis present

## 2014-06-20 DIAGNOSIS — R6521 Severe sepsis with septic shock: Secondary | ICD-10-CM | POA: Diagnosis present

## 2014-06-20 DIAGNOSIS — G9581 Conus medullaris syndrome: Secondary | ICD-10-CM | POA: Diagnosis present

## 2014-06-20 DIAGNOSIS — R401 Stupor: Secondary | ICD-10-CM

## 2014-06-20 DIAGNOSIS — Z853 Personal history of malignant neoplasm of breast: Secondary | ICD-10-CM

## 2014-06-20 DIAGNOSIS — J9601 Acute respiratory failure with hypoxia: Secondary | ICD-10-CM | POA: Diagnosis present

## 2014-06-20 LAB — VITAMIN B12: Vitamin B-12: 517 pg/mL (ref 211–911)

## 2014-06-20 LAB — LACTIC ACID, PLASMA
LACTIC ACID, VENOUS: 7.5 mmol/L — AB (ref 0.5–2.2)
Lactic Acid, Venous: 6.9 mmol/L — ABNORMAL HIGH (ref 0.5–2.2)

## 2014-06-20 LAB — URINALYSIS, ROUTINE W REFLEX MICROSCOPIC
Bilirubin Urine: NEGATIVE
GLUCOSE, UA: NEGATIVE mg/dL
Glucose, UA: NEGATIVE mg/dL
Ketones, ur: 15 mg/dL — AB
Ketones, ur: NEGATIVE mg/dL
Nitrite: NEGATIVE
Nitrite: NEGATIVE
PROTEIN: 100 mg/dL — AB
PROTEIN: 100 mg/dL — AB
SPECIFIC GRAVITY, URINE: 1.017 (ref 1.005–1.030)
SPECIFIC GRAVITY, URINE: 1.01 (ref 1.005–1.030)
Urobilinogen, UA: 1 mg/dL (ref 0.0–1.0)
Urobilinogen, UA: 1 mg/dL (ref 0.0–1.0)
pH: 5 (ref 5.0–8.0)
pH: 5.5 (ref 5.0–8.0)

## 2014-06-20 LAB — BASIC METABOLIC PANEL
ANION GAP: 17 — AB (ref 5–15)
ANION GAP: 20 — AB (ref 5–15)
BUN: 21 mg/dL (ref 6–23)
BUN: 21 mg/dL (ref 6–23)
CALCIUM: 7 mg/dL — AB (ref 8.4–10.5)
CALCIUM: 6.3 mg/dL — AB (ref 8.4–10.5)
CO2: 12 meq/L — AB (ref 19–32)
CO2: 14 mEq/L — ABNORMAL LOW (ref 19–32)
Chloride: 101 mEq/L (ref 96–112)
Chloride: 98 mEq/L (ref 96–112)
Creatinine, Ser: 2.2 mg/dL — ABNORMAL HIGH (ref 0.50–1.10)
Creatinine, Ser: 2.3 mg/dL — ABNORMAL HIGH (ref 0.50–1.10)
GFR calc Af Amer: 24 mL/min — ABNORMAL LOW (ref >90)
GFR calc non Af Amer: 21 mL/min — ABNORMAL LOW (ref >90)
GFR calc non Af Amer: 20 mL/min — ABNORMAL LOW (ref >90)
GFR, EST AFRICAN AMERICAN: 25 mL/min — AB (ref >90)
Glucose, Bld: 115 mg/dL — ABNORMAL HIGH (ref 70–99)
Glucose, Bld: 288 mg/dL — ABNORMAL HIGH (ref 70–99)
Potassium: 4.6 mEq/L (ref 3.7–5.3)
Potassium: 3.7 mEq/L (ref 3.7–5.3)
SODIUM: 130 meq/L — AB (ref 137–147)
Sodium: 132 mEq/L — ABNORMAL LOW (ref 137–147)

## 2014-06-20 LAB — COMPREHENSIVE METABOLIC PANEL
ALK PHOS: 101 U/L (ref 39–117)
ALT: 19 U/L (ref 0–35)
AST: 41 U/L — AB (ref 0–37)
Albumin: 2.9 g/dL — ABNORMAL LOW (ref 3.5–5.2)
Anion gap: 22 — ABNORMAL HIGH (ref 5–15)
BUN: 23 mg/dL (ref 6–23)
CALCIUM: 8.6 mg/dL (ref 8.4–10.5)
CHLORIDE: 91 meq/L — AB (ref 96–112)
CO2: 15 meq/L — AB (ref 19–32)
Creatinine, Ser: 2.5 mg/dL — ABNORMAL HIGH (ref 0.50–1.10)
GFR calc Af Amer: 21 mL/min — ABNORMAL LOW (ref >90)
GFR, EST NON AFRICAN AMERICAN: 18 mL/min — AB (ref >90)
GLUCOSE: 75 mg/dL (ref 70–99)
POTASSIUM: 4.7 meq/L (ref 3.7–5.3)
SODIUM: 128 meq/L — AB (ref 137–147)
Total Bilirubin: 0.9 mg/dL (ref 0.3–1.2)
Total Protein: 6.3 g/dL (ref 6.0–8.3)

## 2014-06-20 LAB — CBC WITH DIFFERENTIAL/PLATELET
BASOS PCT: 0 % (ref 0–1)
Basophils Absolute: 0 10*3/uL (ref 0.0–0.1)
EOS ABS: 0 10*3/uL (ref 0.0–0.7)
EOS PCT: 0 % (ref 0–5)
HEMATOCRIT: 23 % — AB (ref 36.0–46.0)
Hemoglobin: 7.7 g/dL — ABNORMAL LOW (ref 12.0–15.0)
Lymphocytes Relative: 3 % — ABNORMAL LOW (ref 12–46)
Lymphs Abs: 1.1 10*3/uL (ref 0.7–4.0)
MCH: 21.8 pg — ABNORMAL LOW (ref 26.0–34.0)
MCHC: 33.5 g/dL (ref 30.0–36.0)
MCV: 65.2 fL — ABNORMAL LOW (ref 78.0–100.0)
MONOS PCT: 9 % (ref 3–12)
Monocytes Absolute: 3.2 10*3/uL — ABNORMAL HIGH (ref 0.1–1.0)
NEUTROS ABS: 31.3 10*3/uL — AB (ref 1.7–7.7)
NEUTROS PCT: 88 % — AB (ref 43–77)
Platelets: 349 10*3/uL (ref 150–400)
RBC: 3.53 MIL/uL — AB (ref 3.87–5.11)
RDW: 19.3 % — ABNORMAL HIGH (ref 11.5–15.5)
WBC MORPHOLOGY: INCREASED
WBC: 35.6 10*3/uL — AB (ref 4.0–10.5)

## 2014-06-20 LAB — URINE MICROSCOPIC-ADD ON

## 2014-06-20 LAB — I-STAT ARTERIAL BLOOD GAS, ED
Acid-base deficit: 13 mmol/L — ABNORMAL HIGH (ref 0.0–2.0)
Bicarbonate: 12.9 mEq/L — ABNORMAL LOW (ref 20.0–24.0)
O2 Saturation: 96 %
TCO2: 14 mmol/L (ref 0–100)
pCO2 arterial: 28 mmHg — ABNORMAL LOW (ref 35.0–45.0)
pH, Arterial: 7.271 — ABNORMAL LOW (ref 7.350–7.450)
pO2, Arterial: 95 mmHg (ref 80.0–100.0)

## 2014-06-20 LAB — POCT I-STAT 3, ART BLOOD GAS (G3+)
Acid-base deficit: 18 mmol/L — ABNORMAL HIGH (ref 0.0–2.0)
Acid-base deficit: 14 mmol/L — ABNORMAL HIGH (ref 0.0–2.0)
BICARBONATE: 13.2 meq/L — AB (ref 20.0–24.0)
Bicarbonate: 10.8 mEq/L — ABNORMAL LOW (ref 20.0–24.0)
O2 Saturation: 98 %
O2 Saturation: 99 %
PH ART: 7.099 — AB (ref 7.350–7.450)
PH ART: 7.168 — AB (ref 7.350–7.450)
Patient temperature: 98.6
TCO2: 12 mmol/L (ref 0–100)
TCO2: 14 mmol/L (ref 0–100)
pCO2 arterial: 34.9 mmHg — ABNORMAL LOW (ref 35.0–45.0)
pCO2 arterial: 37.4 mmHg (ref 35.0–45.0)
pO2, Arterial: 149 mmHg — ABNORMAL HIGH (ref 80.0–100.0)
pO2, Arterial: 160 mmHg — ABNORMAL HIGH (ref 80.0–100.0)

## 2014-06-20 LAB — GLUCOSE, CAPILLARY
GLUCOSE-CAPILLARY: 102 mg/dL — AB (ref 70–99)
Glucose-Capillary: 160 mg/dL — ABNORMAL HIGH (ref 70–99)

## 2014-06-20 LAB — MRSA PCR SCREENING: MRSA BY PCR: POSITIVE — AB

## 2014-06-20 LAB — CARBOXYHEMOGLOBIN
Carboxyhemoglobin: 0.7 % (ref 0.5–1.5)
Methemoglobin: 1.4 % (ref 0.0–1.5)
O2 Saturation: 63.7 %
Total hemoglobin: 7.4 g/dL — ABNORMAL LOW (ref 12.0–16.0)

## 2014-06-20 LAB — CREATININE, URINE, RANDOM: CREATININE, URINE: 91.59 mg/dL

## 2014-06-20 LAB — FERRITIN: Ferritin: 33 ng/mL (ref 10–291)

## 2014-06-20 LAB — RETICULOCYTES
RBC.: 3.24 MIL/uL — AB (ref 3.87–5.11)
RETIC CT PCT: 2.5 % (ref 0.4–3.1)
Retic Count, Absolute: 81 10*3/uL (ref 19.0–186.0)

## 2014-06-20 LAB — CORTISOL: Cortisol, Plasma: 36.4 ug/dL

## 2014-06-20 LAB — MAGNESIUM: Magnesium: 0.9 mg/dL — CL (ref 1.5–2.5)

## 2014-06-20 LAB — TROPONIN I
Troponin I: 7.79 ng/mL (ref <0.30)
Troponin I: 10.1 ng/mL (ref <0.30)

## 2014-06-20 LAB — PROTIME-INR
INR: 1.75 — AB (ref 0.00–1.49)
PROTHROMBIN TIME: 20.4 s — AB (ref 11.6–15.2)

## 2014-06-20 LAB — IRON AND TIBC
Iron: <10 ug/dL — ABNORMAL LOW (ref 42–135)
UIBC: 305 ug/dL (ref 125–400)

## 2014-06-20 LAB — PROCALCITONIN: PROCALCITONIN: 34.48 ng/mL

## 2014-06-20 LAB — LACTATE DEHYDROGENASE: LDH: 260 U/L — AB (ref 94–250)

## 2014-06-20 LAB — LIPASE, BLOOD: LIPASE: 6 U/L — AB (ref 11–59)

## 2014-06-20 LAB — TSH: TSH: 2.12 u[IU]/mL (ref 0.350–4.500)

## 2014-06-20 LAB — CBG MONITORING, ED: Glucose-Capillary: 88 mg/dL (ref 70–99)

## 2014-06-20 LAB — I-STAT CG4 LACTIC ACID, ED: LACTIC ACID, VENOUS: 5.85 mmol/L — AB (ref 0.5–2.2)

## 2014-06-20 LAB — PHOSPHORUS: Phosphorus: 3.3 mg/dL (ref 2.3–4.6)

## 2014-06-20 LAB — AMYLASE: Amylase: 27 U/L (ref 0–105)

## 2014-06-20 LAB — APTT: APTT: 49 s — AB (ref 24–37)

## 2014-06-20 LAB — SODIUM, URINE, RANDOM: Sodium, Ur: 25 mEq/L

## 2014-06-20 LAB — FOLATE: Folate: 8.6 ng/mL

## 2014-06-20 MED ORDER — LEVOTHYROXINE SODIUM 75 MCG PO TABS
75.0000 ug | ORAL_TABLET | Freq: Every day | ORAL | Status: DC
  Administered 2014-06-21 – 2014-06-26 (×6): 75 ug via ORAL
  Filled 2014-06-20 (×9): qty 1

## 2014-06-20 MED ORDER — DEXTROSE 5 % IV SOLN
0.5000 ug/min | INTRAVENOUS | Status: DC
  Administered 2014-06-20: 20 ug/min via INTRAVENOUS
  Filled 2014-06-20: qty 1

## 2014-06-20 MED ORDER — VANCOMYCIN HCL IN DEXTROSE 1-5 GM/200ML-% IV SOLN
1000.0000 mg | Freq: Once | INTRAVENOUS | Status: AC
  Administered 2014-06-20: 1000 mg via INTRAVENOUS
  Filled 2014-06-20: qty 200

## 2014-06-20 MED ORDER — SODIUM BICARBONATE 8.4 % IV SOLN
INTRAVENOUS | Status: DC
  Administered 2014-06-20 – 2014-06-21 (×2): via INTRAVENOUS
  Filled 2014-06-20 (×5): qty 150

## 2014-06-20 MED ORDER — CHLORHEXIDINE GLUCONATE 0.12 % MT SOLN
15.0000 mL | Freq: Two times a day (BID) | OROMUCOSAL | Status: DC
  Administered 2014-06-20 – 2014-06-26 (×13): 15 mL via OROMUCOSAL
  Filled 2014-06-20 (×12): qty 15

## 2014-06-20 MED ORDER — FERROUS SULFATE 325 (65 FE) MG PO TABS
325.0000 mg | ORAL_TABLET | Freq: Three times a day (TID) | ORAL | Status: DC
  Administered 2014-06-21: 325 mg via ORAL
  Filled 2014-06-20 (×5): qty 1

## 2014-06-20 MED ORDER — SODIUM BICARBONATE 8.4 % IV SOLN
100.0000 meq | Freq: Once | INTRAVENOUS | Status: AC
  Administered 2014-06-20: 100 meq via INTRAVENOUS
  Filled 2014-06-20: qty 100

## 2014-06-20 MED ORDER — PHENYLEPHRINE HCL 10 MG/ML IJ SOLN
30.0000 ug/min | INTRAVENOUS | Status: DC
  Filled 2014-06-20: qty 1

## 2014-06-20 MED ORDER — SODIUM BICARBONATE 8.4 % IV SOLN
INTRAVENOUS | Status: AC
  Filled 2014-06-20: qty 50

## 2014-06-20 MED ORDER — ETOMIDATE 2 MG/ML IV SOLN
20.0000 mg | Freq: Once | INTRAVENOUS | Status: AC
  Administered 2014-06-20: 20 mg via INTRAVENOUS

## 2014-06-20 MED ORDER — NOREPINEPHRINE BITARTRATE 1 MG/ML IV SOLN
2.0000 ug/min | INTRAVENOUS | Status: DC
  Administered 2014-06-20: 6 ug/min via INTRAVENOUS
  Filled 2014-06-20: qty 4

## 2014-06-20 MED ORDER — DEXTROSE 5 % IV SOLN
5.0000 ug/min | INTRAVENOUS | Status: DC
  Administered 2014-06-20: 25 ug/min via INTRAVENOUS
  Filled 2014-06-20 (×2): qty 4

## 2014-06-20 MED ORDER — PIPERACILLIN-TAZOBACTAM IN DEX 2-0.25 GM/50ML IV SOLN
2.2500 g | Freq: Four times a day (QID) | INTRAVENOUS | Status: DC
  Filled 2014-06-20 (×4): qty 50

## 2014-06-20 MED ORDER — HYDROCORTISONE NA SUCCINATE PF 100 MG IJ SOLR
50.0000 mg | Freq: Four times a day (QID) | INTRAMUSCULAR | Status: DC
  Administered 2014-06-20 – 2014-06-21 (×3): 50 mg via INTRAVENOUS
  Filled 2014-06-20 (×7): qty 1

## 2014-06-20 MED ORDER — PIPERACILLIN-TAZOBACTAM 3.375 G IVPB 30 MIN
3.3750 g | Freq: Once | INTRAVENOUS | Status: AC
  Administered 2014-06-20: 3.375 g via INTRAVENOUS
  Filled 2014-06-20: qty 50

## 2014-06-20 MED ORDER — PIPERACILLIN-TAZOBACTAM 3.375 G IVPB
3.3750 g | Freq: Three times a day (TID) | INTRAVENOUS | Status: DC
  Administered 2014-06-20 – 2014-06-21 (×2): 3.375 g via INTRAVENOUS
  Filled 2014-06-20 (×4): qty 50

## 2014-06-20 MED ORDER — ACETAMINOPHEN 650 MG RE SUPP
650.0000 mg | Freq: Four times a day (QID) | RECTAL | Status: DC | PRN
  Administered 2014-06-20: 650 mg via RECTAL
  Filled 2014-06-20: qty 1

## 2014-06-20 MED ORDER — SODIUM CHLORIDE 0.9 % IV BOLUS (SEPSIS)
1000.0000 mL | INTRAVENOUS | Status: DC | PRN
  Administered 2014-06-20: 1000 mL via INTRAVENOUS

## 2014-06-20 MED ORDER — ASPIRIN EC 81 MG PO TBEC
81.0000 mg | DELAYED_RELEASE_TABLET | Freq: Every day | ORAL | Status: DC
  Filled 2014-06-20 (×2): qty 1

## 2014-06-20 MED ORDER — CETYLPYRIDINIUM CHLORIDE 0.05 % MT LIQD
7.0000 mL | Freq: Four times a day (QID) | OROMUCOSAL | Status: DC
  Administered 2014-06-20 – 2014-06-27 (×26): 7 mL via OROMUCOSAL

## 2014-06-20 MED ORDER — MAGNESIUM SULFATE 40 MG/ML IJ SOLN
2.0000 g | Freq: Once | INTRAMUSCULAR | Status: AC
  Administered 2014-06-20: 2 g via INTRAVENOUS
  Filled 2014-06-20: qty 50

## 2014-06-20 MED ORDER — NORTRIPTYLINE HCL 25 MG PO CAPS
25.0000 mg | ORAL_CAPSULE | Freq: Every day | ORAL | Status: DC
  Filled 2014-06-20 (×2): qty 1

## 2014-06-20 MED ORDER — HEPARIN SODIUM (PORCINE) 5000 UNIT/ML IJ SOLN
5000.0000 [IU] | Freq: Three times a day (TID) | INTRAMUSCULAR | Status: DC
  Administered 2014-06-20 – 2014-06-21 (×2): 5000 [IU] via SUBCUTANEOUS
  Filled 2014-06-20 (×5): qty 1

## 2014-06-20 MED ORDER — FLUCONAZOLE IN SODIUM CHLORIDE 400-0.9 MG/200ML-% IV SOLN
800.0000 mg | Freq: Once | INTRAVENOUS | Status: AC
  Administered 2014-06-20: 800 mg via INTRAVENOUS
  Filled 2014-06-20: qty 400

## 2014-06-20 MED ORDER — FENTANYL CITRATE 0.05 MG/ML IJ SOLN: 50.0000 ug | Freq: Once | INTRAMUSCULAR | Status: DC

## 2014-06-20 MED ORDER — LEVOFLOXACIN IN D5W 750 MG/150ML IV SOLN
750.0000 mg | Freq: Once | INTRAVENOUS | Status: AC
  Administered 2014-06-20: 750 mg via INTRAVENOUS
  Filled 2014-06-20 (×3): qty 150

## 2014-06-20 MED ORDER — VASOPRESSIN 20 UNIT/ML IJ SOLN
0.0300 [IU]/min | INTRAVENOUS | Status: DC
  Administered 2014-06-20: 0.03 [IU]/min via INTRAVENOUS
  Filled 2014-06-20 (×2): qty 2

## 2014-06-20 MED ORDER — EPINEPHRINE HCL 1 MG/ML IJ SOLN
0.5000 ug/min | INTRAVENOUS | Status: DC
  Administered 2014-06-20 – 2014-06-21 (×4): 20 ug/min via INTRAVENOUS
  Filled 2014-06-20 (×6): qty 4

## 2014-06-20 MED ORDER — PHENYLEPHRINE HCL 10 MG/ML IJ SOLN
30.0000 ug/min | INTRAVENOUS | Status: DC
  Administered 2014-06-20: 300 ug/min via INTRAVENOUS
  Administered 2014-06-20: 100 ug/min via INTRAVENOUS
  Administered 2014-06-20 – 2014-06-21 (×4): 300 ug/min via INTRAVENOUS
  Filled 2014-06-20 (×7): qty 4

## 2014-06-20 MED ORDER — INSULIN ASPART 100 UNIT/ML ~~LOC~~ SOLN
0.0000 [IU] | SUBCUTANEOUS | Status: DC
  Administered 2014-06-20: 3 [IU] via SUBCUTANEOUS
  Administered 2014-06-20: 11 [IU] via SUBCUTANEOUS

## 2014-06-20 MED ORDER — ACETAMINOPHEN 650 MG RE SUPP: 650.0000 mg | Freq: Once | RECTAL | Status: DC

## 2014-06-20 MED ORDER — SODIUM CHLORIDE 0.9 % IV BOLUS (SEPSIS): 1000.0000 mL | INTRAVENOUS | Status: DC | PRN

## 2014-06-20 MED ORDER — MIDAZOLAM HCL 2 MG/2ML IJ SOLN
INTRAMUSCULAR | Status: AC
  Administered 2014-06-20: 2 mg
  Filled 2014-06-20: qty 2

## 2014-06-20 MED ORDER — FENTANYL CITRATE 0.05 MG/ML IJ SOLN
INTRAMUSCULAR | Status: AC
  Administered 2014-06-20: 100 ug
  Filled 2014-06-20: qty 2

## 2014-06-20 MED ORDER — LEVOFLOXACIN IN D5W 500 MG/100ML IV SOLN
500.0000 mg | INTRAVENOUS | Status: DC
  Administered 2014-06-22: 500 mg via INTRAVENOUS
  Filled 2014-06-20 (×2): qty 100

## 2014-06-20 MED ORDER — FENTANYL BOLUS VIA INFUSION
25.0000 ug | INTRAVENOUS | Status: DC | PRN
  Filled 2014-06-20: qty 50

## 2014-06-20 MED ORDER — SODIUM CHLORIDE 0.9 % IV BOLUS (SEPSIS)
1000.0000 mL | Freq: Once | INTRAVENOUS | Status: AC
  Administered 2014-06-20: 1000 mL via INTRAVENOUS

## 2014-06-20 MED ORDER — SODIUM CHLORIDE 0.9 % IV SOLN
0.0000 ug/h | INTRAVENOUS | Status: DC
  Administered 2014-06-20: 50 ug/h via INTRAVENOUS
  Administered 2014-06-21 (×2): 200 ug/h via INTRAVENOUS
  Administered 2014-06-22: 100 ug/h via INTRAVENOUS
  Administered 2014-06-24 (×3): 400 ug/h via INTRAVENOUS
  Administered 2014-06-24: 200 ug/h via INTRAVENOUS
  Administered 2014-06-25: 300 ug/h via INTRAVENOUS
  Administered 2014-06-26: 150 ug/h via INTRAVENOUS
  Administered 2014-06-26: 100 ug/h via INTRAVENOUS
  Administered 2014-06-27: 400 ug/h via INTRAVENOUS
  Filled 2014-06-20 (×14): qty 50

## 2014-06-20 MED ORDER — SODIUM CHLORIDE 0.9 % IV SOLN
250.0000 mL | INTRAVENOUS | Status: DC | PRN
  Administered 2014-06-24: 250 mL via INTRAVENOUS

## 2014-06-20 MED ORDER — SENNOSIDES-DOCUSATE SODIUM 8.6-50 MG PO TABS
1.0000 | ORAL_TABLET | Freq: Every day | ORAL | Status: DC
  Filled 2014-06-20 (×2): qty 1

## 2014-06-20 MED ORDER — PANTOPRAZOLE SODIUM 40 MG PO TBEC: 40.0000 mg | DELAYED_RELEASE_TABLET | Freq: Every day | ORAL | Status: DC

## 2014-06-20 MED ORDER — SODIUM CHLORIDE 0.9 % IV BOLUS (SEPSIS)
30.0000 mL/kg | Freq: Once | INTRAVENOUS | Status: AC
  Administered 2014-06-20: 2190 mL via INTRAVENOUS

## 2014-06-20 MED ORDER — FLUCONAZOLE IN SODIUM CHLORIDE 400-0.9 MG/200ML-% IV SOLN
400.0000 mg | INTRAVENOUS | Status: DC
  Filled 2014-06-20: qty 200

## 2014-06-20 MED ORDER — ATROPINE SULFATE 0.1 MG/ML IJ SOLN
INTRAMUSCULAR | Status: AC
  Filled 2014-06-20: qty 10

## 2014-06-20 MED ORDER — VANCOMYCIN HCL IN DEXTROSE 1-5 GM/200ML-% IV SOLN
1000.0000 mg | INTRAVENOUS | Status: DC
  Administered 2014-06-22: 1000 mg via INTRAVENOUS
  Filled 2014-06-20: qty 200

## 2014-06-20 MED ORDER — SODIUM CHLORIDE 0.9 % IV SOLN
1.0000 g | Freq: Once | INTRAVENOUS | Status: AC
  Administered 2014-06-20: 1 g via INTRAVENOUS
  Filled 2014-06-20: qty 10

## 2014-06-20 MED ORDER — SODIUM CHLORIDE 0.9 % IV SOLN
1000.0000 mL | INTRAVENOUS | Status: DC
  Administered 2014-06-20 (×2): 1000 mL via INTRAVENOUS

## 2014-06-20 MED ORDER — EPINEPHRINE HCL 0.1 MG/ML IJ SOSY
PREFILLED_SYRINGE | INTRAMUSCULAR | Status: AC
  Filled 2014-06-20: qty 10

## 2014-06-20 MED ORDER — ALBUTEROL SULFATE (2.5 MG/3ML) 0.083% IN NEBU
INHALATION_SOLUTION | RESPIRATORY_TRACT | Status: AC
  Filled 2014-06-20: qty 3

## 2014-06-20 NOTE — Progress Notes (Signed)
eLink Physician-Brief Progress Note Patient Name: Alexandra Wiley DOB: 09/16/1946 MRN: [id number]   Date of Service  06/17/2014  HPI/Events of Note  Shock persists despite multiple pressors fever  eICU Interventions  Add epi gtt Give 2 amps bicarbonate injection now Change upper guardrail on  Discussed situation with the patient's husband. Explained that she has refractory shock in setting of sepsis and that performing CPR would not be helpful.  He understood. Will change code status to limited code blue: full vasopressors, antibiotics, ventilator, etc, but will not perform CPR/defibrillation.     Intervention Category Major Interventions: Shock - evaluation and management  Alder Murri 05/23/2014, 7:54 PM

## 2014-06-20 NOTE — Progress Notes (Signed)
[place] Progress Note Patient Name: Alexandra Wiley DOB: 08/21/1946 MRN: [id number]   Date of Service  06/05/2014  HPI/Events of Note  New patient, septic shock, comfortable on camera  Source: UTI vs wounds Abx: vanc/zosyn Hemodynamics:  Filed Vitals:   05/24/2014 1345 06/08/2014 1350 05/25/2014 1400 05/27/2014 1413  BP: 113/61 95/67 85/52  92/45  Pulse:    127  Temp:      TempSrc:      Resp: 18 21 24 24   Height:      Weight:      SpO2:    93%      Vasopressors: levophed 35mcg/min Lactic acid: 5.85  Coox: not collected Stress dose steroids: not indicated     eICU Interventions  MD to staff May need CVL, serial lactic acid Continue monitor from [facility], will re-round     Intervention Category Evaluation Type: New Patient Evaluation  MCQUAID, DOUGLAS 05/22/2014, 3:38 PM

## 2014-06-20 NOTE — Progress Notes (Addendum)
ANTIBIOTIC CONSULT NOTE - INITIAL  Pharmacy Consult for vancomycin + zosyn + levaquin Indication: rule out sepsis  No Known Allergies  Patient Measurements: Height: 4' 11.06" (150 cm) Weight: 161 lb (73.029 kg) IBW/kg (Calculated) : 43.33 Adjusted Body Weight:  Vital Signs: Temp: 100.2 F (37.9 C) (09/30 1206) Temp src: Rectal (09/30 1206) BP: 76/32 mmHg (09/30 1300) Pulse Rate: 125 (09/30 1212) Intake/Output from previous day:   Intake/Output from this shift:    Labs:  Recent Labs  06/06/2014 1220  WBC 35.6*  HGB 7.7*  PLT 349  CREATININE 2.50*   Estimated Creatinine Clearance: 18.2 ml/min (by C-G formula based on Cr of 2.5). No results found for this basename: VANCOTROUGH, VANCOPEAK, VANCORANDOM, GENTTROUGH, GENTPEAK, GENTRANDOM, TOBRATROUGH, TOBRAPEAK, TOBRARND, AMIKACINPEAK, AMIKACINTROU, AMIKACIN,  in the last 72 hours   Microbiology: No results found for this or any previous visit (from the past 720 hour(s)).  Medical History: Past Medical History  Diagnosis Date  . Ulcer   . Hypertension   . Heart murmur   . Gout   . PVD (peripheral vascular disease)   . Pain in limb   . CHF (congestive heart failure)   . 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 Conus medullaris syndrome   . Arthritis   . Breast cancer ~ 1975    right  . Carpal tunnel syndrome of right wrist   . High cholesterol   . Angina   . Myocardial infarction 1996  . Pneumonia ~ 2002; ~1975  . Type II diabetes mellitus   . Exertional dyspnea   . Hypothyroidism   . Seizures 02/02/12    "used to; a long time ago"  . Stroke 10/2008    "spinal cord"  . Neurogenic bladder   . Irregular heartbeat     Medications:  Anti-infectives   Start     Dose/Rate Route Frequency Ordered Stop   06/22/14 1300  vancomycin (VANCOCIN) IVPB 1000 mg/200 mL premix     1,000 mg 200 mL/hr over 60 Minutes Intravenous Every 48 hours 05/24/2014 1324     06/08/2014 2000  piperacillin-tazobactam (ZOSYN) IVPB 2.25 g      2.25 g 100 mL/hr over 30 Minutes Intravenous Every 6 hours 06/02/2014 1324     05/30/2014 1230  piperacillin-tazobactam (ZOSYN) IVPB 3.375 g     3.375 g 100 mL/hr over 30 Minutes Intravenous  Once 06/19/2014 1221 06/14/2014 1305   06/19/2014 1230  vancomycin (VANCOCIN) IVPB 1000 mg/200 mL premix     1,000 mg 200 mL/hr over 60 Minutes Intravenous  Once 06/05/2014 1221       Assessment: 64 yof with a history of paraplegia presented to the ED with AMS and hypoglycemia. Started on broad-spectrum antibiotics for possible sepsis. First doses of vancomycin + zosyn given in the ED. Pt with Tmax of 100.2, WBC significantly elevated at 35.6, Scr elevated at 2.5 and lactic acid elevated at 5.85.   Vanc 9/30>> Zosyn 9/30>>  Goal of Therapy:  Vancomycin trough level 15-20 mcg/ml  Plan:  1. Vancomycin 1gm IV Q48H 2. Zosyn 2.25mg  IV Q6H 3. F/u renal fxn, C&S, clinical status and trough at [place], Rande Lawman 05/23/2014,1:24 PM  Addn: Pharmacy was consulted to dose levaquin for CAP. Pt was started on Zosyn and Vancomycin and has continued to worsen into septic shock though renal function has improved since admission as CrCl ~ 27 mL/min Plan: -Give Levaquin 750mg  tonight followed by 500mg  q48h  -Increase Zosyn to 3.375g IV  q8h  Andrey Cota. Diona Foley, PharmD Clinical Pharmacist Pager [phone]

## 2014-06-20 NOTE — Progress Notes (Signed)
CRITICAL VALUE ALERT  Critical value received: 6.3 Ca  Date of notification:  06/05/2014  Time of notification:  2216  Critical value read back:Yes  Nurse who received alert:  Rito Ehrlich, RN  MD notified (1st page):  Dr. Lake Bells  Time of first page:  2217  MD notified (2nd page):  Time of second page:  Responding MD:  Dr. Lake Bells  Time MD responded: 2217

## 2014-06-20 NOTE — Progress Notes (Signed)
eLink Physician-Brief Progress Note Patient Name: Alexandra Wiley DOB: 20-Sep-1946 MRN: [id number]   Date of Service  06/19/2014  HPI/Events of Note  Worsening shock, lactic acid climbing Troponin positive> demand ischemia in setting of severe shock  eICU Interventions  STAT CT abdomen> abdominal source? Hold heparin as demand ischemia Repeat EKG Continue serial lactic acid/abg Arterial line     Intervention Category Major Interventions: Shock - evaluation and management  MCQUAID, DOUGLAS 06/13/2014, 7:04 PM

## 2014-06-20 NOTE — Progress Notes (Signed)
Utilization Review Completed.Shanterica Biehler T9/30/2015  

## 2014-06-20 NOTE — ED Notes (Signed)
To ED form home for AMS, hypogylcemia, arrives tachy, hypotensive and altered, MD to bedside, code sepsis initiated,

## 2014-06-20 NOTE — Progress Notes (Signed)
[place] Progress Note Patient Name: Alexandra Wiley DOB: [birth year] MRN: [id number]   Date of Service  06/19/2014  HPI/Events of Note  Septic Shock with worsening acidosis CVP 12 on vent Sinus tach to 140 on levophed  eICU Interventions  Increase RR to 28 Bolus saline again > 1 liter Change saline infusion to bicarb gtt Change levophed to neo Continue vasopressin     Intervention Category Major Interventions: Shock - evaluation and management;Sepsis - evaluation and management Intermediate Interventions: Arrhythmia - evaluation and management  Germaine Shenker 06/02/2014, 6:07 PM

## 2014-06-20 NOTE — Progress Notes (Signed)
RT and charge RT attempted to place arterial line but was unsuccessful. An ABG was obtained.

## 2014-06-20 NOTE — H&P (Signed)
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   Initial complaints of AMS & low blood sugar.    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  She reports she called EMS due to low blood sugars and family concerns of pt sleeping all day (unusual for patient).    History was obtained by patient, husband and son.  She reports episodes of hypo/hyperglycemia and lightheadedness/confusion since 9/29.  She has been sleeping more during the day x 2 days.  She reports permanent urethral catheter which she or her [place] change on the 3rd of each month.  Patient reports she changed her catheter independently this past month.  9/30 she noted a darkening of the urine, but no odor or cloudiness. She reports a non- productive cough that began am of admit, but no SOB.  At baseline, she is wheelchair bound, is able to transfer from bed to chair/toliet.  She is followed at the [facility] for a L heel ulcer that she reports improvement in size / appearance.  Her PCP is Dr. Jeanann Lewandowsky, [facility] Dr. Donnetta Hutching, Rehab Dr. Naaman Plummer and Urology Dr. McDiarmid.  She lives at home with her husband and has home health  RN and an aide that assist with catheter care, DM mgmt and manual disimpaction.    Upon arrival, pt was noted to be tachycardic and hypotensive.  Lab work notable for metabolic acidosis, AG 22, Na 122, Cr 2.50 (baseline ~2), lactic acid 5.85, WBC 35.6, Hgb 7.7 (baseline ~ 9) and CXR with bilateral lower haziness concerning for possible effusions vs infiltrate.  Patient had one episode of soft stool on arrival.  She was treated with broad spectrum antibiotics & volume resuscitated but remained hypotensive.  Levophed was started to maintain MAP.  Concerns for sepsis with possible urinary source, PCCM called for ICU admission.     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  has past surgical history that includes Foot Tendon Surgery (2000); Tubal ligation; Femur fracture surgery (1960's); Breast lumpectomy (~2841); Tonsillectomy and adenoidectomy (~ 1961); Dilation and curettage of uterus; Fracture surgery; Amputation (02/05/2012); and right leg amputation (01/2012).  Prior to Admission medications   Medication Sig Start Date End Date Taking? Authorizing Provider  allopurinol (ZYLOPRIM) 100 MG tablet Take 100 mg by  mouth daily.     Yes Historical Provider, MD  amLODipine (NORVASC) 5 MG tablet Take 5 mg by mouth daily.     Yes Historical Provider, MD  aspirin EC 81 MG tablet Take 81 mg by mouth daily.   Yes Historical Provider, MD  atorvastatin (LIPITOR) 20 MG tablet Take 20 mg by mouth at bedtime.   Yes Historical Provider,  MD  baclofen (LIORESAL) 10 MG tablet Take 10 mg by mouth 2 (two) times daily.    Yes Historical Provider, MD  colchicine 0.6 MG tablet Take 0.6 mg by mouth 2 (two) times daily. For gout. 11/18/11 06/17/2014 Yes Meredith Staggers, MD  diclofenac sodium (VOLTAREN) 1 % GEL Apply 2 g topically 4 (four) times daily. Left knee   Yes Historical Provider, MD  gabapentin (NEURONTIN) 300 MG capsule Take 300 mg by mouth 4 (four) times daily.   Yes Historical Provider, MD  hydrochlorothiazide (MICROZIDE) 12.5 MG capsule Take 12.5 mg by mouth daily.   Yes Historical Provider, MD  insulin aspart protamine-insulin aspart (NOVOLOG 70/30) (70-30) 100 UNIT/ML injection Inject 12 Units into the skin as directed. Sliding scale. 10 units if blood sugar is 125, 12 units if blood sugar is 150, 16 units if sugar is 200 and above   Yes Historical Provider, MD  levothyroxine (SYNTHROID, LEVOTHROID) 75 MCG tablet Take 75 mcg by mouth daily.     Yes Historical Provider, MD  methocarbamol (ROBAXIN) 500 MG tablet Take 500 mg by mouth every 6 (six) hours as needed for muscle spasms.    Yes Historical Provider, MD  metoprolol-hydrochlorothiazide (LOPRESSOR HCT) 100-25 MG per tablet Take 1 tablet by mouth daily.    Yes Historical Provider, MD  nortriptyline (PAMELOR) 25 MG capsule Take 25 mg by mouth at bedtime.   Yes Historical Provider, MD  omeprazole (PRILOSEC) 20 MG capsule Take 20 mg by mouth daily.   Yes Historical Provider, MD  oxybutynin (DITROPAN-XL) 10 MG 24 hr tablet Take 10 mg by mouth 2 (two) times daily.    Yes Historical Provider, MD  oxycodone (OXY-IR) 5 MG capsule Take 1 capsule (5 mg total) by mouth every 6 (six) hours as needed. For pain 11/22/13  Yes Meredith Staggers, MD   No Known Allergies  FAMILY HISTORY:  indicated that her mother is alive. She indicated that her father is deceased. She indicated that her sister is deceased. She indicated that her son is alive.   SOCIAL HISTORY:  reports that she has been  smoking.  She has never used smokeless tobacco. She reports that she does not drink alcohol or use illicit drugs.  REVIEW OF SYSTEMS:   Gen: Denies fever, chills, weight change, fatigue, night sweats HEENT: Denies blurred vision, double vision, hearing loss, tinnitus, sinus congestion, rhinorrhea, sore throat, neck stiffness, dysphagia PULM: Denies shortness of breath, sputum production, hemoptysis, wheezing.  Reports non-productive cough that started this am CV: Denies chest pain, edema, orthopnea, paroxysmal nocturnal dyspnea, palpitations GI: Denies abdominal pain, nausea, vomiting, diarrhea, hematochezia, melena, change in bowel habits.   She has chronic fecal impaction requiring manual disimpaction 3x/week GU: Denies dysuria, hematuria, polyuria, oliguria, urethral discharge.  Reports change in color of urine appearance Endocrine: Denies hot or cold intolerance, polyuria, polyphagia.  She reports decreased appetite x 2 days Derm: Denies rash, dry skin, scaling or peeling skin change.  Reports chronic L heel ulceration[facility] Heme: Denies easy bruising, bleeding, bleeding gums, she admits to increased ice chewing Neuro: Denies  headache, numbness, slurred speech, loss of memory or consciousness.   She reports weakness, increased daytime sleepiness and lightheadedness    SUBJECTIVE: Pt reports no pain, has felt lightheaded and sleepy  VITAL SIGNS: Temp:  [100.2 F (37.9 C)] 100.2 F (37.9 C) (09/30 1206) Pulse Rate:  [111-127] 127 (09/30 1413) Resp:  [13-30] 24 (09/30 1413) BP: (51-113)/(19-67) 92/45 mmHg (09/30 1413) SpO2:  [93 %-100 %] 93 % (09/30 1413) Weight:  [161 lb (73.029 kg)] 161 lb (73.029 kg) (09/30 1215)  HEMODYNAMICS:    VENTILATOR SETTINGS:    INTAKE / OUTPUT:  Intake/Output Summary (Last 24 hours) at 05/29/2014 1555 Last data filed at 06/10/2014 1431  Gross per 24 hour  Intake   4400 ml  Output    100 ml  Net   4300 ml    PHYSICAL  EXAMINATION: General:  Chronically ill female lying in bed, escalating distress Neuro:  Alert and oriented to person, place and time, MUE.  R BKA, LLE paralysis HEENT:  NCAT, no JVD Cardiovascular:  Tachy, no m/r/g Lungs:  resp's even/non-labored, lungs bilaterally with lower crackles Abdomen:  Soft, intermittent tenerness difuse, earlier exam without any tenderness, no r/g Musculoskeletal:  S/p R AKA,  Skin:  Heel ulcer R, no necrotic tissue, red/pink granulation tissue surrounded by min maceration, quarter in size   LABS:  CBC  Recent Labs Lab 06/14/2014 1220  WBC 35.6*  HGB 7.7*  HCT 23.0*  PLT 349   Coag's No results found for this basename: APTT, INR,  in the last 168 hours  BMET  Recent Labs Lab 05/31/2014 1220  NA 128*  K 4.7  CL 91*  CO2 15*  BUN 23  CREATININE 2.50*  GLUCOSE 75   Electrolytes  Recent Labs Lab 06/12/2014 1220  CALCIUM 8.6   Sepsis Markers  Recent Labs Lab 06/07/2014 1256  LATICACIDVEN 5.85*   ABG  Recent Labs Lab 06/12/2014 1448  PHART 7.271*  PCO2ART 28.0*  PO2ART 95.0    Liver Enzymes  Recent Labs Lab 06/02/2014 1220  AST 41*  ALT 19  ALKPHOS 101  BILITOT 0.9  ALBUMIN 2.9*   Cardiac Enzymes No results found for this basename: TROPONINI, PROBNP,  in the last 168 hours  Glucose  Recent Labs Lab 05/22/2014 1426  GLUCAP 88    Imaging No results found.   ASSESSMENT / PLAN:  PULMONARY OETT n/a  A: Dyspnea / Tacyhpnea  - mild edema on CXR, no overt infiltrate, may be developing rt Mild Pulmonary Edema - ? Effusions vs developing infiltrate right interstial, aspiration? R/o pulmonary source - follow RLL for development of infiltrate Tobacco Abuse Presumed COPD Acute resp failure  P:   Trend CXR Oxygen to support sats >92% Empiric abx, see ID UPDATE: worsened status, required emergent intubation For 8 cc/kg, high rate likly needed with uncompensated met acidosis Repeat abg pcxr for  ett  CARDIOVASCULAR CVL A:  Septic shock - suspected urinary source, +/- pulmonary component  CHF (EF 60-65% 01/2012) Hx HTN Hx MI P:  Levophed to MAP goal 65 Vasopressin addition is reasonable with levo less 15 mics If levo reduced to 10 mics can transition to neo with tachy Fluid resuscitation, x4 L in ER.  NS @ 125 Place TLC Assess CVP Hold home amlodipine, HCTZ, Lopressor HCTZ EKG Assess troponin, cortisol  svo2 conisderation Source control  RENAL A:  Acute on Chronic Renal Failure Hyponatremia - likely in setting of volume depletion + chronic HCTZ   R/o obstruction P:  Monitor BMP Correct electrolytes as indicated Assess Renal US, urine lytes Ensure renal perfusion No role bicarb unless K rises with lactic acidosis   GI/GU A:  Chronic Fecal Impaction Neurogenic bladder R/o abdominal source as seems sicker than urine simple infection alone P:   Manual disimpaction 3x/week NPO for now PPI Continue home oxybutynin Consider STAT CT abdo for course control Renal US on order Ensure, amy, lip, ldh,. lactic  HEMATOLOGIC A:  Iron deficiency anemia - pt formerly on iron but stopped after she was told it improved P:  Iron supplementation Monitor CBC DVT prophylaxis, SQ heparin coags needed  INFECTIOUS A:  Septic shock - suspected urinary source, rule out pulmonary, PNA right R/o additional source abdo given degree of illness Chronic LLE Heel Wound  P:   BCx2 9/29 >> UC 9/29 >> UA 9/29 >>   Abx: vanc, start date 9/29, day 1/x Abx: zosyn, start date 9/29, day 1/x  Add empiric atypical with development int changes pcxr Will add empiric diflucan until ct done Awaiting blood and urine cultures Follow fever curve, leukocytosis trend  Assess SVO2 Repeat Lactic acid now WOC Consult  Renal consult CT STAT with oral contrast likely needed, await follow up lactic and stability of trabnsfer  ENDOCRINE A:  Hypothyroid  DM II R/o rel AI P:   Continue  levothyroxine SSI Assess TSH  Cortisol then empiric roids  NEUROLOGIC A:   Chronic pain Hx paraplegia s/p spinal fx Insomnia  P:   RASS goal: n/a Hold home baclofen, gabapentin, oxycodone  Continue  ETT needed, add fent infusion  Family updated: at bedside 9/30    Interdisciplinary Family Meeting v Palliative Care Meeting: n/a   TODAY'S SUMMARY: 68 y/o female admitted to ICU through ED for septic shock, suspect urinary sourc, but worsening significantly, concerning for other source Abdo?Marland Kitchen  Rx with septic shock protocol.  S/P 4L in ER, now on levo + vaso.  Await repeat lactic acid, CVP.   ETT placement  Noe Gens, NP-C [facility] Pgr: [phone] or [phone]   I have personally obtained a history, examined the patient, evaluated laboratory and imaging results, formulated the assessment and plan and placed orders.  CRITICAL CARE: The patient is critically ill with multiple organ systems failure and requires high complexity decision making for assessment and support, frequent evaluation and titration of therapies, application of advanced monitoring technologies and extensive interpretation of multiple databases. Critical Care Time devoted to patient care services described in this note is 51mnutes.   DLavon Paganini FTitus Mould MD, FSiloPgr: 3[facility]  Pulmonary and CCobbtownPager: [phone] 05/25/2014, 3:55 PM

## 2014-06-20 NOTE — ED Notes (Signed)
NOTIFIED DR. Mingo Amber OF PATIENTS PANIC LAB RESULTS OF CG4+ LACTIC ACID = 5.85 mmol/L @13 :00 PM ,06/09/2014.

## 2014-06-20 NOTE — ED Notes (Signed)
PCCM to bedside

## 2014-06-20 NOTE — ED Provider Notes (Signed)
CSN: [id number]     Arrival date & time 06/03/2014  1144 History   First MD Initiated Contact with Patient 06/16/2014 1150     Chief Complaint  Patient presents with  . Code Sepsis     (Consider location/radiation/quality/duration/timing/severity/associated sxs/prior Treatment) Patient is a 68 y.o. female presenting with weakness. The history is provided by the patient.  Weakness This is a new problem. The current episode started 6 to 12 hours ago. The problem occurs constantly. The problem has been gradually worsening. Pertinent negatives include no chest pain, no abdominal pain and no shortness of breath. Nothing aggravates the symptoms. Nothing relieves the symptoms. She has tried nothing for the symptoms.    Past Medical History  Diagnosis Date  . Ulcer   . Hypertension   . Heart murmur   . Gout   . PVD (peripheral vascular disease)   . Pain in limb   . CHF (congestive heart failure)   . 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 Conus medullaris syndrome   . Arthritis   . Breast cancer ~ 1975    right  . Carpal tunnel syndrome of right wrist   . High cholesterol   . Angina   . Myocardial infarction 1996  . Pneumonia ~ 2002; ~1975  . Type II diabetes mellitus   . Exertional dyspnea   . Hypothyroidism   . Seizures 02/02/12    "used to; a long time ago"  . Stroke 10/2008    "spinal cord"  . Neurogenic bladder   . Irregular heartbeat    Past Surgical History  Procedure Laterality Date  . Foot tendon surgery  2000    Left foot  . Tubal ligation    . Femur fracture surgery  1960's    Left leg, repair of non-union femur fracture by Dr. Elvina Mattes  . Breast lumpectomy  ~1975    Right breast carcinoma in situ  . Tonsillectomy and adenoidectomy  ~ 1961  . Dilation and curettage of uterus    . Fracture surgery    . Amputation  02/05/2012    Procedure: AMPUTATION BELOW KNEE;  Surgeon: Rosetta Posner, MD;  [facility];  Service: Vascular;  Laterality: Right;  right  above  knee amputation  . Right leg amputation  01/2012   Family History  Problem Relation Age of Onset  . Arthritis Mother   . Hypertension Mother   . Heart disease Mother     before age 75  . Hyperlipidemia Mother   . Other Mother     varicose veins  . Heart attack Mother   . Heart disease Father   . Diabetes Father   . Hypertension Sister   . Hyperlipidemia Sister   . Other Sister     varicose veins  . Hypertension Son   . Heart attack Son   . Diabetes Brother   . Heart disease Brother   . Hypertension Brother   . Diabetes Daughter   . Hyperlipidemia Daughter   . Heart disease Daughter     before age 40  . Other Daughter     varicose veins   History  Substance Use Topics  . Smoking status: Current Every Day Smoker -- 0.50 packs/day for 25 years  . Smokeless tobacco: Never Used     Comment: pt states that she is not intrested in support group because she would not be able to get there  . Alcohol Use: No     Comment:  02/02/12 "last alcohol ~ 2008"   OB History   Grav Para Term Preterm Abortions TAB SAB Ect Mult Living                 Review of Systems  Constitutional: Negative for fever and chills.  Respiratory: Negative for cough and shortness of breath.   Cardiovascular: Negative for chest pain.  Gastrointestinal: Negative for abdominal pain.  Neurological: Positive for weakness.  All other systems reviewed and are negative.     Allergies  Review of patient's allergies indicates no known allergies.  Home Medications   Prior to Admission medications   Medication Sig Start Date End Date Taking? Authorizing Provider  allopurinol (ZYLOPRIM) 100 MG tablet Take 100 mg by mouth daily.      Historical Provider, MD  amLODipine (NORVASC) 5 MG tablet Take 5 mg by mouth daily.      Historical Provider, MD  baclofen (LIORESAL) 10 MG tablet Take 10 mg by mouth 2 (two) times daily.     Historical Provider, MD  colchicine 0.6 MG tablet Take 0.6 mg by mouth every 12 (twelve)  hours as needed. For gout. 11/18/11 11/17/12  Meredith Staggers, MD  DOXYCYCLINE HYCLATE PO Take by mouth. Unsure of dose.  Taking for foot.    Historical Provider, MD  ferrous fumarate (HEMOCYTE - 106 MG FE) 325 (106 FE) MG TABS tablet Take 1 tablet by mouth.    Historical Provider, MD  insulin aspart protamine-insulin aspart (NOVOLOG 70/30) (70-30) 100 UNIT/ML injection Inject 12 Units into the skin as directed. Sliding scale. 10 units if blood sugar is 125, 12 units if blood sugar is 150, 16 units if sugar is 200 and above    Historical Provider, MD  levothyroxine (SYNTHROID, LEVOTHROID) 75 MCG tablet Take 75 mcg by mouth daily.      Historical Provider, MD  methocarbamol (ROBAXIN) 500 MG tablet Take 500 mg by mouth daily.     Historical Provider, MD  metoprolol-hydrochlorothiazide (LOPRESSOR HCT) 100-25 MG per tablet Take 0.5 tablets by mouth daily.     Historical Provider, MD  oxybutynin (DITROPAN-XL) 10 MG 24 hr tablet Take 10 mg by mouth daily.      Historical Provider, MD  oxycodone (OXY-IR) 5 MG capsule Take 1 capsule (5 mg total) by mouth every 6 (six) hours as needed. For pain 11/22/13   Meredith Staggers, MD  promethazine-codeine [facility] WITH CODEINE) 6.25-10 MG/5ML syrup Take by mouth every 6 (six) hours as needed for cough.    Historical Provider, MD  Valproic Acid (DEPAKENE) 250 MG/5ML SYRP syrup Take 20 mLs (1,000 mg total) by mouth 2 (two) times daily. 02/23/12   Waldemar Dickens, MD  VOLTAREN 1 % GEL APPLY 2 GRAMS TOPICALLY FOUR TIMES DAILY TO LEFT KNEE    Meredith Staggers, MD   BP 75/54  Pulse 125  Resp 16  SpO2 99% Physical Exam  Nursing note and vitals reviewed. Constitutional: She is oriented to person, place, and time. She appears well-developed and well-nourished. No distress.  HENT:  Head: Normocephalic and atraumatic.  Mouth/Throat: Oropharynx is clear and moist.  Eyes: EOM are normal. Pupils are equal, round, and reactive to light.  Neck: Normal range of motion. Neck  supple.  Cardiovascular: Normal rate and regular rhythm.  Exam reveals no friction rub.   No murmur heard. Pulmonary/Chest: Effort normal and breath sounds normal. No respiratory distress. She has no wheezes. She has no rales.  Abdominal: Soft. She exhibits no distension.  There is no tenderness. There is no rebound.  Musculoskeletal: Normal range of motion. She exhibits no edema.  R AKA L heel with mild purulence at base, no surrounding cellulitis  Neurological: She is alert and oriented to person, place, and time. No cranial nerve deficit. She exhibits normal muscle tone.  Skin: Skin is warm. No rash noted. She is not diaphoretic.    ED Course  Procedures (including critical care time) Labs Review Labs Reviewed  CULTURE, BLOOD (ROUTINE X 2)  CULTURE, BLOOD (ROUTINE X 2)  URINE CULTURE  CBC WITH DIFFERENTIAL  COMPREHENSIVE METABOLIC PANEL  URINALYSIS, ROUTINE W REFLEX MICROSCOPIC  I-STAT CG4 LACTIC ACID, ED  I-STAT CG4 LACTIC ACID, ED    Imaging Review Dg Chest Port 1 View  05/22/2014   CLINICAL DATA:  Shortness of breath, chest pain  EXAM: PORTABLE CHEST - 1 VIEW  COMPARISON:  12/12/2012  FINDINGS: Bilateral interstitial and alveolar airspace opacities. No significant pleural effusion or pneumothorax. No focal consolidation. Stable cardiomediastinal silhouette.  The osseous structures are unremarkable.  IMPRESSION: Findings most concerning for mild pulmonary edema.   Electronically Signed   By: Kathreen Devoid   On: 06/11/2014 12:55     EKG Interpretation   Date/Time:  Wednesday June 20 2014 12:14:00 EDT Ventricular Rate:  113 PR Interval:  158 QRS Duration: 121 QT Interval:  333 QTC Calculation: 456 R Axis:   41 Text Interpretation:  Sinus tachycardia Right bundle branch block Inferior  infarct, age indeterminate Lateral leads are also involved Baseline wander  in lead(s) V2 Similar to prior Confirmed by Mingo Amber  MD, [place] ([id number]) on  06/14/2014 2:03:09 PM     CRITICAL  CARE Performed by: Osvaldo Shipper   Total critical care time: 30 minutes  Critical care time was exclusive of separately billable procedures and treating other patients.  Critical care was necessary to treat or prevent imminent or life-threatening deterioration.  Critical care was time spent personally by me on the following activities: development of treatment plan with patient and/or surrogate as well as nursing, discussions with consultants, evaluation of patient's response to treatment, examination of patient, obtaining history from patient or surrogate, ordering and performing treatments and interventions, ordering and review of laboratory studies, ordering and review of radiographic studies, pulse oximetry and re-evaluation of patient's condition.  MDM   Final diagnoses:  Septic shock  UTI (lower urinary tract infection)  Hypotension, unspecified hypotension type  Encephalopathy    12F here from home with malaise, some confusion. Multiple comorbidties: HTN, PVD, CHF, MI.  Here hypotensive, tachycardic, listless. Started feeling bad last night, denies N/V/D, denies cough, CP, SOB.  Code Sepsis initiated with her hypotension and tachycardia. BPs in the 50s initially, improved with ongoing fluid boluses into the 47M systolic after about 1 L. Will continue with heavy hydration, antibitoics, plan for admission. After 3 L NS, BPs still low. Peripheral Levophed initiated. Lactate elevated at 5.85. Critical Care consulted, will admit. On re-exam while initiating Levophed, patient is mentating better, speaking in full sentences, but she is confused. She is more alert, but confused. Prior to, had been speaking in one word sentences, looking listless.    Evelina Bucy, MD 06/04/2014 [phone]

## 2014-06-20 NOTE — Procedures (Signed)
Central Venous Catheter Insertion Procedure Note Alexandra Wiley [id number] 1946-03-12  Procedure: Insertion of Central Venous Catheter Indications: Assessment of intravascular volume, Drug and/or fluid administration and Frequent blood sampling  Procedure Details Consent: Risks of procedure as well as the alternatives and risks of each were explained to the (patient/caregiver).  Consent for procedure obtained. Time Out: Verified patient identification, verified procedure, site/side was marked, verified correct patient position, special equipment/implants available, medications/allergies/relevent history reviewed, required imaging and test results available.  Performed Real time [place] was used to ID and cannulate the vessel  Maximum sterile technique was used including antiseptics, cap, gloves, gown, hand hygiene, mask and sheet. Skin prep: Chlorhexidine; local anesthetic administered A antimicrobial bonded/coated triple lumen catheter was placed in the left internal jugular vein using the Seldinger technique.  Evaluation Blood flow good Complications: No apparent complications Patient did tolerate procedure well. Chest X-ray ordered to verify placement.  CXR: pending.  Alexandra Wiley,Alexandra Wiley 06/19/2014, 4:40 PM  US guidance Tolerated supervized   Alexandra Wiley. Alexandra Mould, MD, [place] Pgr: [facility]

## 2014-06-20 NOTE — Progress Notes (Signed)
[facility] Progress Note Patient Name: Alexandra Wiley DOB: 12-21-1945 MRN: [id number]   Date of Service  06/06/2014  HPI/Events of Note  hypocalcemia  eICU Interventions  replaced     Intervention Category Minor Interventions: Electrolytes abnormality - evaluation and management  Tenita Cue 06/16/2014, 10:17 PM

## 2014-06-20 NOTE — Procedures (Signed)
Intubation Procedure Note Alexandra Wiley [id number] August 21, 1946  Procedure: Intubation Indications: Respiratory insufficiency  Procedure Details Consent: Unable to obtain consent because of emergent medical necessity. Time Out: Verified patient identification, verified procedure, site/side was marked, verified correct patient position, special equipment/implants available, medications/allergies/relevent history reviewed, required imaging and test results available.  Performed  Maximum sterile technique was used including cap, gloves, gown, hand hygiene and mask.  MAC and 3    Evaluation Hemodynamic Status: BP stable throughout; O2 sats: stable throughout on levophed prior to procedure.    Patient's Current Condition: stable Complications: No apparent complications Patient did tolerate procedure well. Chest X-ray ordered to verify placement.  CXR: pending.  Procedure performed under direct supervision of Dr. Titus Mould and with Glide-Scope.      Noe Gens, NP-C [facility] Pgr: [phone] or [phone]   06/01/2014 Tolerated well   Lavon Paganini. Titus Mould, MD, [place] Pgr: [facility]

## 2014-06-21 ENCOUNTER — Inpatient Hospital Stay (HOSPITAL_COMMUNITY): Payer: Medicare Other

## 2014-06-21 ENCOUNTER — Encounter (HOSPITAL_COMMUNITY): Payer: Self-pay

## 2014-06-21 DIAGNOSIS — R6521 Severe sepsis with septic shock: Secondary | ICD-10-CM

## 2014-06-21 DIAGNOSIS — J9600 Acute respiratory failure, unspecified whether with hypoxia or hypercapnia: Secondary | ICD-10-CM

## 2014-06-21 DIAGNOSIS — R401 Stupor: Secondary | ICD-10-CM

## 2014-06-21 DIAGNOSIS — J9601 Acute respiratory failure with hypoxia: Secondary | ICD-10-CM

## 2014-06-21 DIAGNOSIS — A419 Sepsis, unspecified organism: Secondary | ICD-10-CM

## 2014-06-21 LAB — GLUCOSE, CAPILLARY
GLUCOSE-CAPILLARY: 471 mg/dL — AB (ref 70–99)
GLUCOSE-CAPILLARY: 478 mg/dL — AB (ref 70–99)
GLUCOSE-CAPILLARY: 409 mg/dL — AB (ref 70–99)
GLUCOSE-CAPILLARY: 352 mg/dL — AB (ref 70–99)
GLUCOSE-CAPILLARY: 86 mg/dL (ref 70–99)
GLUCOSE-CAPILLARY: 63 mg/dL — AB (ref 70–99)
Glucose-Capillary: 113 mg/dL — ABNORMAL HIGH (ref 70–99)
Glucose-Capillary: 140 mg/dL — ABNORMAL HIGH (ref 70–99)
Glucose-Capillary: 144 mg/dL — ABNORMAL HIGH (ref 70–99)
Glucose-Capillary: 209 mg/dL — ABNORMAL HIGH (ref 70–99)
Glucose-Capillary: 213 mg/dL — ABNORMAL HIGH (ref 70–99)
Glucose-Capillary: 325 mg/dL — ABNORMAL HIGH (ref 70–99)
Glucose-Capillary: 335 mg/dL — ABNORMAL HIGH (ref 70–99)
Glucose-Capillary: 417 mg/dL — ABNORMAL HIGH (ref 70–99)
Glucose-Capillary: 473 mg/dL — ABNORMAL HIGH (ref 70–99)
Glucose-Capillary: 509 mg/dL — ABNORMAL HIGH (ref 70–99)

## 2014-06-21 LAB — CBC
HCT: 20.2 % — ABNORMAL LOW (ref 36.0–46.0)
HCT: 26.9 % — ABNORMAL LOW (ref 36.0–46.0)
HEMATOCRIT: 26.6 % — AB (ref 36.0–46.0)
HEMOGLOBIN: 9 g/dL — AB (ref 12.0–15.0)
Hemoglobin: 6.7 g/dL — CL (ref 12.0–15.0)
Hemoglobin: 8.9 g/dL — ABNORMAL LOW (ref 12.0–15.0)
MCH: 22.6 pg — ABNORMAL LOW (ref 26.0–34.0)
MCH: 22.7 pg — ABNORMAL LOW (ref 26.0–34.0)
MCH: 22.8 pg — AB (ref 26.0–34.0)
MCHC: 33.2 g/dL (ref 30.0–36.0)
MCHC: 33.5 g/dL (ref 30.0–36.0)
MCHC: 33.5 g/dL (ref 30.0–36.0)
MCV: 68.2 fL — ABNORMAL LOW (ref 78.0–100.0)
MCV: 67.9 fL — ABNORMAL LOW (ref 78.0–100.0)
MCV: 68.1 fL — ABNORMAL LOW (ref 78.0–100.0)
PLATELETS: 217 10*3/uL (ref 150–400)
PLATELETS: 154 10*3/uL (ref 150–400)
Platelets: 173 10*3/uL (ref 150–400)
RBC: 2.96 MIL/uL — ABNORMAL LOW (ref 3.87–5.11)
RBC: 3.92 MIL/uL (ref 3.87–5.11)
RBC: 3.95 MIL/uL (ref 3.87–5.11)
RDW: 19.8 % — AB (ref 11.5–15.5)
RDW: 20.9 % — ABNORMAL HIGH (ref 11.5–15.5)
RDW: 20.1 % — ABNORMAL HIGH (ref 11.5–15.5)
WBC: 53.4 10*3/uL — AB (ref 4.0–10.5)
WBC: 48.1 10*3/uL — AB (ref 4.0–10.5)
WBC: 48.7 10*3/uL — AB (ref 4.0–10.5)

## 2014-06-21 LAB — URINE CULTURE: Colony Count: >=100000

## 2014-06-21 LAB — LACTIC ACID, PLASMA
LACTIC ACID, VENOUS: 4.9 mmol/L — AB (ref 0.5–2.2)
Lactic Acid, Venous: 9.3 mmol/L — ABNORMAL HIGH (ref 0.5–2.2)

## 2014-06-21 LAB — BASIC METABOLIC PANEL
ANION GAP: 20 — AB (ref 5–15)
Anion gap: 16 — ABNORMAL HIGH (ref 5–15)
BUN: 21 mg/dL (ref 6–23)
BUN: 23 mg/dL (ref 6–23)
CALCIUM: 6.8 mg/dL — AB (ref 8.4–10.5)
CHLORIDE: 89 meq/L — AB (ref 96–112)
CO2: 12 meq/L — AB (ref 19–32)
CO2: 19 meq/L (ref 19–32)
CREATININE: 2.24 mg/dL — AB (ref 0.50–1.10)
CREATININE: 2.29 mg/dL — AB (ref 0.50–1.10)
Calcium: 7.1 mg/dL — ABNORMAL LOW (ref 8.4–10.5)
Chloride: 97 mEq/L (ref 96–112)
GFR calc Af Amer: 24 mL/min — ABNORMAL LOW (ref >90)
GFR calc Af Amer: 24 mL/min — ABNORMAL LOW (ref >90)
GFR calc non Af Amer: 20 mL/min — ABNORMAL LOW (ref >90)
GFR, EST NON AFRICAN AMERICAN: 21 mL/min — AB (ref >90)
GLUCOSE: 338 mg/dL — AB (ref 70–99)
Glucose, Bld: 416 mg/dL — ABNORMAL HIGH (ref 70–99)
Potassium: 3.8 mEq/L (ref 3.7–5.3)
Potassium: 3.6 mEq/L — ABNORMAL LOW (ref 3.7–5.3)
Sodium: 129 mEq/L — ABNORMAL LOW (ref 137–147)
Sodium: 124 mEq/L — ABNORMAL LOW (ref 137–147)

## 2014-06-21 LAB — POCT I-STAT 3, ART BLOOD GAS (G3+)
Acid-base deficit: 9 mmol/L — ABNORMAL HIGH (ref 0.0–2.0)
BICARBONATE: 18.6 meq/L — AB (ref 20.0–24.0)
O2 Saturation: 89 %
PH ART: 7.23 — AB (ref 7.350–7.450)
PO2 ART: 69 mmHg — AB (ref 80.0–100.0)
Patient temperature: 99.6
TCO2: 20 mmol/L (ref 0–100)
pCO2 arterial: 44.8 mmHg (ref 35.0–45.0)

## 2014-06-21 LAB — HEPATIC FUNCTION PANEL
ALT: 27 U/L (ref 0–35)
AST: 103 U/L — AB (ref 0–37)
Albumin: 2 g/dL — ABNORMAL LOW (ref 3.5–5.2)
Alkaline Phosphatase: 75 U/L (ref 39–117)
BILIRUBIN TOTAL: 1.5 mg/dL — AB (ref 0.3–1.2)
Bilirubin, Direct: 1.3 mg/dL — ABNORMAL HIGH (ref 0.0–0.3)
Indirect Bilirubin: 0.2 mg/dL — ABNORMAL LOW (ref 0.3–0.9)
Total Protein: 5 g/dL — ABNORMAL LOW (ref 6.0–8.3)

## 2014-06-21 LAB — OSMOLALITY, URINE: Osmolality, Ur: 225 mOsm/kg — ABNORMAL LOW (ref 390–1090)

## 2014-06-21 LAB — MAGNESIUM: MAGNESIUM: 1.2 mg/dL — AB (ref 1.5–2.5)

## 2014-06-21 LAB — PHOSPHORUS: Phosphorus: 3.6 mg/dL (ref 2.3–4.6)

## 2014-06-21 LAB — LACTATE DEHYDROGENASE: LDH: 482 U/L — AB (ref 94–250)

## 2014-06-21 LAB — PREPARE RBC (CROSSMATCH)

## 2014-06-21 LAB — TROPONIN I: Troponin I: 13.9 ng/mL (ref <0.30)

## 2014-06-21 MED ORDER — SODIUM CHLORIDE 0.9 % IV SOLN
INTRAVENOUS | Status: DC
  Administered 2014-06-21: 11:00:00 via INTRAVENOUS

## 2014-06-21 MED ORDER — CHLORHEXIDINE GLUCONATE CLOTH 2 % EX PADS
6.0000 | MEDICATED_PAD | Freq: Every day | CUTANEOUS | Status: AC
  Administered 2014-06-21 – 2014-06-25 (×5): 6 via TOPICAL

## 2014-06-21 MED ORDER — DEXTROSE 50 % IV SOLN
50.0000 mL | Freq: Once | INTRAVENOUS | Status: AC
  Filled 2014-06-21: qty 50

## 2014-06-21 MED ORDER — DEXTROSE-NACL 5-0.9 % IV SOLN
INTRAVENOUS | Status: DC
  Administered 2014-06-21: 16:00:00 via INTRAVENOUS

## 2014-06-21 MED ORDER — FLUCONAZOLE IN SODIUM CHLORIDE 200-0.9 MG/100ML-% IV SOLN
200.0000 mg | INTRAVENOUS | Status: DC
  Filled 2014-06-21: qty 100

## 2014-06-21 MED ORDER — MAGNESIUM SULFATE 4000MG/100ML IJ SOLN
4.0000 g | Freq: Once | INTRAMUSCULAR | Status: AC
  Administered 2014-06-21: 4 g via INTRAVENOUS
  Filled 2014-06-21: qty 100

## 2014-06-21 MED ORDER — SODIUM CHLORIDE 0.9 % IV SOLN
250.0000 mg | Freq: Two times a day (BID) | INTRAVENOUS | Status: DC
  Administered 2014-06-21 – 2014-06-23 (×5): 250 mg via INTRAVENOUS
  Filled 2014-06-21 (×6): qty 250

## 2014-06-21 MED ORDER — INSULIN REGULAR HUMAN 100 UNIT/ML IJ SOLN
INTRAMUSCULAR | Status: DC
  Administered 2014-06-21: 4.1 [IU]/h via INTRAVENOUS
  Filled 2014-06-21 (×2): qty 2.5

## 2014-06-21 MED ORDER — MUPIROCIN 2 % EX OINT
1.0000 "application " | TOPICAL_OINTMENT | Freq: Two times a day (BID) | CUTANEOUS | Status: AC
  Administered 2014-06-21 – 2014-06-25 (×10): 1 via NASAL
  Filled 2014-06-21 (×2): qty 22

## 2014-06-21 MED ORDER — DEXTROSE 50 % IV SOLN
INTRAVENOUS | Status: AC
  Administered 2014-06-21: 50 mL
  Filled 2014-06-21: qty 50

## 2014-06-21 MED ORDER — PANTOPRAZOLE SODIUM 40 MG PO PACK
40.0000 mg | PACK | Freq: Every day | ORAL | Status: DC
  Administered 2014-06-21 – 2014-06-26 (×6): 40 mg
  Filled 2014-06-21 (×7): qty 20

## 2014-06-21 MED ORDER — SODIUM CHLORIDE 0.9 % IV SOLN
Freq: Once | INTRAVENOUS | Status: AC
Start: 2014-06-21 — End: 2014-06-21
  Administered 2014-06-21: 06:00:00 via INTRAVENOUS

## 2014-06-21 MED ORDER — NOREPINEPHRINE BITARTRATE 1 MG/ML IV SOLN
2.0000 ug/min | INTRAVENOUS | Status: DC
  Administered 2014-06-21: 25 ug/min via INTRAVENOUS
  Administered 2014-06-21: 35 ug/min via INTRAVENOUS
  Administered 2014-06-23: 6 ug/min via INTRAVENOUS
  Administered 2014-06-25: 8 ug/min via INTRAVENOUS
  Administered 2014-06-26: 10 ug/min via INTRAVENOUS
  Administered 2014-06-26: 6 ug/min via INTRAVENOUS
  Filled 2014-06-21 (×5): qty 16

## 2014-06-21 MED ORDER — DEXTROSE-NACL 5-0.9 % IV SOLN: INTRAVENOUS | Status: DC

## 2014-06-21 NOTE — Progress Notes (Signed)
[facility]  Patient Status: Active (receiving services up to time of hospitalization)  [facility] is providing the following services: RN  If patient discharges after hours, please call [phone].   Alexandra Wiley 06/21/2014, 12:41 PM

## 2014-06-21 NOTE — Progress Notes (Signed)
INITIAL NUTRITION ASSESSMENT  DOCUMENTATION CODES Per approved criteria  -Not Applicable   INTERVENTION:  If unable to extubate patient and able to use gut, recommend initiate TF via OGT with Vital High Protein at 25 ml/h and Prostat 30 ml BID on day 1; on day 2, D/C Prostat and increase to goal rate of 55 ml/h (1320 ml per day) to provide 1320 kcals (67% of estimated needs), 116 gm protein, 1104 ml free water daily.  If unable to start enteral nutrition and bowel rest needed for 7-10 days, consider TPN.  NUTRITION DIAGNOSIS: Inadequate oral intake related to inability to eat as evidenced by NPO status.   Goal: Enteral nutrition to provide 60-70% of estimated calorie needs (22-25 kcals/kg ideal body weight) and 100% of estimated protein needs, based on ASPEN guidelines for permissive underfeeding in critically ill obese individuals  Monitor:  Ability to initiate TF, weight trend, labs, vent status.  Reason for Assessment: VDRF  68 y.o. 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 Initial complaints of AMS & low blood sugar.   Nutrition focused physical exam completed.  No muscle or subcutaneous fat depletion noticed. Discussed patient in ICU rounds today. CT scan last night demonstrated diffuse thickening of the colon consistent with colitis. Surgery Team following.  Patient is currently intubated on ventilator support MV: 13.4 L/min Temp (24hrs), Avg:100.1 F (37.8 C), Min:98.6 F (37 C), Max:102.6 F (39.2 C)   Height: Ht Readings from Last 1 Encounters:  06/01/2014 4' 11.06" (1.5 m)    Weight: Wt Readings from Last 1 Encounters:  06/21/14 171 lb 1.2 oz (77.6 kg)    Ideal Body Weight: 42 kg (adjusted for AKA)  % Ideal Body Weight: 185%  Wt Readings from Last 10 Encounters:  06/21/14 171 lb 1.2 oz  (77.6 kg)  06/19/14 161 lb (73.029 kg)  11/22/13 161 lb (73.029 kg)  07/19/13 161 lb (73.029 kg)  05/31/13 161 lb (73.029 kg)  01/18/13 160 lb (72.576 kg)  10/18/12 179 lb (81.194 kg)  09/28/12 179 lb (81.194 kg)  09/06/12 180 lb (81.647 kg)  08/16/12 180 lb (81.647 kg)    Usual Body Weight: 161 lb  % Usual Body Weight: 106%  BMI:  36.7  Estimated Nutritional Needs: Kcal: 1962 Protein: 84-105 gm protein Fluid: 1.8 L  Skin: stage 2 pressure ulcer to buttocks, diabetic ulcer to left heel   Diet Order: NPO  EDUCATION NEEDS: -Education not appropriate at this time   Intake/Output Summary (Last 24 hours) at 06/21/14 1611 Last data filed at 06/21/14 1551  Gross per 24 hour  Intake 9134.92 ml  Output    836 ml  Net 8298.92 ml    Last BM: 9/30   Labs:   Recent Labs Lab 05/27/2014 1354 05/29/2014 2100 06/21/14 0118 06/21/14 0415 06/21/14 1130  NA 130* 132* 129*  --  124*  K 4.6 3.7 3.8  --  3.6*  CL 101 98 97  --  89*  CO2 12* 14* 12*  --  19  BUN 21 21 21   --  23  CREATININE 2.20* 2.30* 2.24*  --  2.29*  CALCIUM 7.0* 6.3* 6.8*  --  7.1*  MG 0.9*  --   --  1.2*  --   PHOS 3.3  --   --  3.6  --  GLUCOSE 115* 288* 416*  --  338*    CBG (last 3)   Recent Labs  06/18/2014 1933 06/19/2014 2343 06/21/14 0432  GLUCAP 160* 335* 473*    Scheduled Meds: . antiseptic oral rinse  7 mL Mouth Rinse QID  . chlorhexidine  15 mL Mouth Rinse BID  . Chlorhexidine Gluconate Cloth  6 each Topical Q0600  . fentaNYL  50 mcg Intravenous Once  . imipenem-cilastatin  250 mg Intravenous Q12H  . [START ON 06/22/2014] levofloxacin (LEVAQUIN) IV  500 mg Intravenous Q48H  . levothyroxine  75 mcg Oral QAC breakfast  . mupirocin ointment  1 application Nasal BID  . pantoprazole sodium  40 mg Per Tube Daily  . [START ON 06/22/2014] vancomycin  1,000 mg Intravenous Q48H    Continuous Infusions: . sodium chloride 50 mL/hr at 06/21/14 1100  . dextrose 5 % and 0.9% NaCl 50 mL/hr at  06/21/14 1606  . fentaNYL infusion INTRAVENOUS 200 mcg/hr (06/21/14 0700)  . insulin (NOVOLIN-R) infusion 13.4 mL/hr at 06/21/14 1500  . norepinephrine (LEVOPHED) Adult infusion 35 mcg/min (06/21/14 1610)    Past Medical History  Diagnosis Date  . Ulcer   . Hypertension   . Heart murmur   . Gout   . PVD (peripheral vascular disease)   . Pain in limb   . CHF (congestive heart failure)   . 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 Conus medullaris syndrome   . Arthritis   . Breast cancer ~ 1975    right  . Carpal tunnel syndrome of right wrist   . High cholesterol   . Angina   . Myocardial infarction 1996  . Pneumonia ~ 2002; ~1975  . Type II diabetes mellitus   . Exertional dyspnea   . Hypothyroidism   . Seizures 02/02/12    "used to; a long time ago"  . Stroke 10/2008    "spinal cord"  . Neurogenic bladder   . Irregular heartbeat     Past Surgical History  Procedure Laterality Date  . Foot tendon surgery  2000    Left foot  . Tubal ligation    . Femur fracture surgery  1960's    Left leg, repair of non-union femur fracture by Dr. Elvina Mattes  . Breast lumpectomy  ~1975    Right breast carcinoma in situ  . Tonsillectomy and adenoidectomy  ~ 1961  . Dilation and curettage of uterus    . Fracture surgery    . Amputation  02/05/2012    Procedure: AMPUTATION BELOW KNEE;  Surgeon: Rosetta Posner, MD;  Location: [facility] OR;  Service: Vascular;  Laterality: Right;  right  above knee amputation  . Right leg amputation  01/2012    Molli Barrows, RD, LDN, [place] Pager [phone] After Hours Pager [phone]

## 2014-06-21 NOTE — Progress Notes (Signed)
ANTIBIOTIC CONSULT NOTE - INITIAL  Pharmacy Consult for Imipenem Indication: r/o sepsis  No Known Allergies  Patient Measurements: Height: 4' 11.06" (150 cm) Weight: 171 lb 1.2 oz (77.6 kg) IBW/kg (Calculated) : 43.33 Vital Signs: Temp: 99.7 F (37.6 C) (10/01 0845) Temp src: Oral (10/01 0845) BP: 64/45 mmHg (10/01 1245) Pulse Rate: 126 (10/01 1245) Intake/Output from previous day: 09/30 0701 - 10/01 0700 In: 12276.9 [I.V.:9906.9; IV Piggyback:2310] Out: 336 [Urine:335; Stool:1] Intake/Output from this shift: Total I/O In: 842 [I.V.:507; Blood:335] Out: 345 [Urine:345]  Labs:  Recent Labs  05/25/2014 1220  06/09/2014 1554 06/18/2014 2100 06/21/14 0118 06/21/14 0415 06/21/14 1130  WBC 35.6*  --   --   --   --  53.4* 48.1*  HGB 7.7*  --   --   --   --  6.7* 8.9*  PLT 349  --   --   --   --  217 173  LABCREA  --   --  91.59  --   --   --   --   CREATININE 2.50*  < >  --  2.30* 2.24*  --  2.29*  < > = values in this interval not displayed. Estimated Creatinine Clearance: 20.6 ml/min (by C-G formula based on Cr of 2.29). No results found for this basename: VANCOTROUGH, Corlis Leak, VANCORANDOM, GENTTROUGH, GENTPEAK, GENTRANDOM, TOBRATROUGH, TOBRAPEAK, TOBRARND, AMIKACINPEAK, AMIKACINTROU, AMIKACIN,  in the last 72 hours   Microbiology: Recent Results (from the past 720 hour(s))  CULTURE, BLOOD (ROUTINE X 2)     Status: None   Collection Time    06/11/2014 12:30 PM      Result Value Ref Range Status   Specimen Description BLOOD RIGHT HAND   Final   Special Requests BOTTLES DRAWN AEROBIC AND ANAEROBIC 4MLS   Final   Culture  Setup Time     Final   Value: 06/09/2014 20:20     Performed at [facility]   Culture     Final   Value: GRAM NEGATIVE RODS     Note: Gram Stain Report Called to,Read Back By and Verified With: JAMES DUNLOP @ 1201 ON [id number] BY [facility]     Performed at [facility]   Report Status PENDING   Incomplete  MRSA PCR SCREENING     Status:  Abnormal   Collection Time    06/10/2014  8:32 PM      Result Value Ref Range Status   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 It is not     intended to diagnose MRSA     infection nor to guide or     monitor treatment for     MRSA infections.     RESULT CALLED TO, READ BACK BY AND VERIFIED WITH:     J.HOLLAND,RN 0003 06/21/14 M.CAMPBELL    Medical History: Past Medical History  Diagnosis Date  . Ulcer   . Hypertension   . Heart murmur   . Gout   . PVD (peripheral vascular disease)   . Pain in limb   . CHF (congestive heart failure)   . 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 Conus medullaris syndrome   . Arthritis   .  Breast cancer ~ 1975    right  . Carpal tunnel syndrome of right wrist   . High cholesterol   . Angina   . Myocardial infarction 1996  . Pneumonia ~ 2002; ~1975  . Type II diabetes mellitus   . Exertional dyspnea   . Hypothyroidism   . Seizures 02/02/12    "used to; a long time ago"  . Stroke 10/2008    "spinal cord"  . Neurogenic bladder   . Irregular heartbeat    Assessment: 57 YOF with r/o UTI vs abdominal infection with worsening leukocytosis, fever, and shock to change from Zosyn to Imipenem for GNR bacteremia. Will narrow when culture results.   Goal of Therapy:  Clinical resolution of infection   Plan:  1. Imipenem 250mg  IV q12h. 2. Consider Cefepime + Flagyl as alternative.  3. Will follow-up culture and narrow as soon as possible.  4. Monitor renal function and adjust dosing as needed.   Sloan Leiter, PharmD, BCPS Clinical Pharmacist [phone] 06/21/2014,1:09 PM

## 2014-06-21 NOTE — Progress Notes (Signed)
[facility] Progress Note Patient Name: Alexandra Wiley DOB: 1945/10/14 MRN: [id number]   Date of Service  06/21/2014  HPI/Events of Note  Hb 6.7  eICU Interventions  1 U PRBC hypomag- repleted     Intervention Category Intermediate Interventions: Bleeding - evaluation and treatment with blood products;Electrolyte abnormality - evaluation and management  ALVA,RAKESH V. 06/21/2014, 5:21 AM

## 2014-06-21 NOTE — Progress Notes (Signed)
Pt blood cultures came back positive for gram negative rods from the aerobic culture. Dr. Wyvonna Plum was notified.

## 2014-06-21 NOTE — Procedures (Signed)
Arterial Catheter Insertion Procedure Note Alexandra Wiley [id number] 19-Nov-1945  Procedure: Insertion of Arterial Catheter  Indications: Blood pressure monitoring and Frequent blood sampling  Procedure Details Consent: Risks of procedure as well as the alternatives and risks of each were explained to the (patient/caregiver).  Consent for procedure obtained. Time Out: Verified patient identification, verified procedure, site/side was marked, verified correct patient position, special equipment/implants available, medications/allergies/relevent history reviewed, required imaging and test results available.  Performed  Maximum sterile technique was used including antiseptics, cap, gloves, gown, hand hygiene, mask and sheet. Skin prep: Chlorhexidine; local anesthetic administered 20 gauge catheter was inserted into right femoral artery using the Seldinger technique.  Evaluation Blood flow good; BP tracing good. Complications: No apparent complications.   Alexandra Wiley. 06/21/2014  [place] No radials  Alexandra Wiley. Titus Mould, MD, [place] Pgr: [facility]

## 2014-06-21 NOTE — Consult Note (Signed)
Reason for Consult:Colitis Referring Physician: Evalee Wiley is an 68 y.o. female.  HPI: Alexandra Wiley was admitted to critical care medicine service with sepsis, initially presumed to be urinary source. She has multiple significant past medical issues including spinal cord stroke with paraplegia, CHF, PVD with right above-knee amputation, & diabetes mellitus. Her sepsis worsened requiring intubation and pressors. CT scan last night demonstrated diffuse thickening of the colon consistent with colitis. No significant colonic distention. No free air. I spoke with her daughter and she reports the patient has had intermittent explosive diarrhea over the past several months. Her primary care physician treated her with some colon medication without much change. According to Dr. Titus Wiley, She has improved somewhat hemodynamically after resuscitation.  Past Medical History  Diagnosis Date  . Ulcer   . Hypertension   . Heart murmur   . Gout   . PVD (peripheral vascular disease)   . Pain in limb   . CHF (congestive heart failure)   . 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 Conus medullaris syndrome   . Arthritis   . Breast cancer ~ 1975    right  . Carpal tunnel syndrome of right wrist   . High cholesterol   . Angina   . Myocardial infarction 1996  . Pneumonia ~ 2002; ~1975  . Type II diabetes mellitus   . Exertional dyspnea   . Hypothyroidism   . Seizures 02/02/12    "used to; a long time ago"  . Stroke 10/2008    "spinal cord"  . Neurogenic bladder   . Irregular heartbeat     Past Surgical History  Procedure Laterality Date  . Foot tendon surgery  2000    Left foot  . Tubal ligation    . Femur fracture surgery  1960's    Left leg, repair of non-union femur fracture by Dr. Elvina Wiley  . Breast lumpectomy  ~1975    Right breast carcinoma in situ  . Tonsillectomy and adenoidectomy  ~ 1961  . Dilation and curettage of uterus    . Fracture surgery    . Amputation   02/05/2012    Procedure: AMPUTATION BELOW KNEE;  Surgeon: Alexandra Posner, MD;  Location: [facility] OR;  Service: Vascular;  Laterality: Right;  right  above knee amputation  . Right leg amputation  01/2012    Family History  Problem Relation Age of Onset  . Arthritis Mother   . Hypertension Mother   . Heart disease Mother     before age 21  . Hyperlipidemia Mother   . Other Mother     varicose veins  . Heart attack Mother   . Heart disease Father   . Diabetes Father   . Hypertension Sister   . Hyperlipidemia Sister   . Other Sister     varicose veins  . Hypertension Son   . Heart attack Son   . Diabetes Brother   . Heart disease Brother   . Hypertension Brother   . Diabetes Daughter   . Hyperlipidemia Daughter   . Heart disease Daughter     before age 25  . Other Daughter     varicose veins    Social History:  reports that she has been smoking.  She has never used smokeless tobacco. She reports that she does not drink alcohol or use illicit drugs.  Allergies: No Known Allergies  Medications:  Scheduled: . antiseptic oral rinse  7 mL Mouth Rinse QID  .  chlorhexidine  15 mL Mouth Rinse BID  . Chlorhexidine Gluconate Cloth  6 each Topical Q0600  . fentaNYL  50 mcg Intravenous Once  . imipenem-cilastatin  250 mg Intravenous Q12H  . [START ON 06/22/2014] levofloxacin (LEVAQUIN) IV  500 mg Intravenous Q48H  . levothyroxine  75 mcg Oral QAC breakfast  . mupirocin ointment  1 application Nasal BID  . pantoprazole sodium  40 mg Per Tube Daily  . [START ON 06/22/2014] vancomycin  1,000 mg Intravenous Q48H   Continuous: . sodium chloride 50 mL/hr at 06/21/14 1100  . fentaNYL infusion INTRAVENOUS 200 mcg/hr (06/21/14 0700)  . insulin (NOVOLIN-R) infusion 21.2 mL/hr at 06/21/14 1300  . norepinephrine (LEVOPHED) Adult infusion 45 mcg/min (06/21/14 1310)   PRN:sodium chloride, acetaminophen, fentaNYL, sodium chloride, sodium chloride  Results for orders placed during the hospital  encounter of 05/30/2014 (from the past 48 hour(s))  CBC WITH DIFFERENTIAL     Status: Abnormal   Collection Time    06/01/2014 12:20 PM      Result Value Ref Range   WBC 35.6 (*) 4.0 - 10.5 K/uL   Comment: WHITE COUNT CONFIRMED ON SMEAR   RBC 3.53 (*) 3.87 - 5.11 MIL/uL   Hemoglobin 7.7 (*) 12.0 - 15.0 g/dL   HCT 23.0 (*) 36.0 - 46.0 %   MCV 65.2 (*) 78.0 - 100.0 fL   MCH 21.8 (*) 26.0 - 34.0 pg   MCHC 33.5  30.0 - 36.0 g/dL   RDW 19.3 (*) 11.5 - 15.5 %   Platelets 349  150 - 400 K/uL   Comment: PLATELET COUNT CONFIRMED BY SMEAR   Neutrophils Relative % 88 (*) 43 - 77 %   Lymphocytes Relative 3 (*) 12 - 46 %   Monocytes Relative 9  3 - 12 %   Eosinophils Relative 0  0 - 5 %   Basophils Relative 0  0 - 1 %   Neutro Abs 31.3 (*) 1.7 - 7.7 K/uL   Lymphs Abs 1.1  0.7 - 4.0 K/uL   Monocytes Absolute 3.2 (*) 0.1 - 1.0 K/uL   Eosinophils Absolute 0.0  0.0 - 0.7 K/uL   Basophils Absolute 0.0  0.0 - 0.1 K/uL   RBC Morphology BURR CELLS     Comment: ELLIPTOCYTES     POLYCHROMASIA PRESENT   WBC Morphology INCREASED BANDS (>20% BANDS)     Comment: DOHLE BODIES  COMPREHENSIVE METABOLIC PANEL     Status: Abnormal   Collection Time    05/22/2014 12:20 PM      Result Value Ref Range   Sodium 128 (*) 137 - 147 mEq/L   Potassium 4.7  3.7 - 5.3 mEq/L   Chloride 91 (*) 96 - 112 mEq/L   CO2 15 (*) 19 - 32 mEq/L   Glucose, Bld 75  70 - 99 mg/dL   BUN 23  6 - 23 mg/dL   Creatinine, Ser 2.50 (*) 0.50 - 1.10 mg/dL   Calcium 8.6  8.4 - 10.5 mg/dL   Total Protein 6.3  6.0 - 8.3 g/dL   Albumin 2.9 (*) 3.5 - 5.2 g/dL   AST 41 (*) 0 - 37 U/L   ALT 19  0 - 35 U/L   Alkaline Phosphatase 101  39 - 117 U/L   Total Bilirubin 0.9  0.3 - 1.2 mg/dL   GFR calc non Af Amer 18 (*) >90 mL/min   GFR calc Af Amer 21 (*) >90 mL/min   Comment: (NOTE)       The eGFR has been calculated using the CKD EPI equation.     This calculation has not been validated in all clinical situations.     eGFR's persistently <90  mL/min signify possible Chronic Kidney     Disease.   Anion gap 22 (*) 5 - 15  CULTURE, BLOOD (ROUTINE X 2)     Status: None   Collection Time    06/10/2014 12:30 PM      Result Value Ref Range   Specimen Description BLOOD RIGHT HAND     Special Requests BOTTLES DRAWN AEROBIC AND ANAEROBIC 4MLS     Culture  Setup Time       Value: 06/02/2014 20:20     Performed at [facility]   Culture       Value: [place]     Note: Gram Stain Report Called to,Read Back By and Verified With: JAMES DUNLOP @ 1201 ON [id number] BY [facility]     Performed at [facility]   Report Status PENDING    URINALYSIS, ROUTINE W REFLEX MICROSCOPIC     Status: Abnormal   Collection Time    05/25/2014 12:42 PM      Result Value Ref Range   Color, Urine RED (*) YELLOW   Comment: BIOCHEMICALS MAY BE AFFECTED BY COLOR   APPearance TURBID (*) CLEAR   Specific Gravity, Urine 1.017  1.005 - 1.030   pH 5.0  5.0 - 8.0   Glucose, UA NEGATIVE  NEGATIVE mg/dL   Hgb urine dipstick LARGE (*) NEGATIVE   Bilirubin Urine SMALL (*) NEGATIVE   Ketones, ur 15 (*) NEGATIVE mg/dL   Protein, ur 100 (*) NEGATIVE mg/dL   Urobilinogen, UA 1.0  0.0 - 1.0 mg/dL   Nitrite NEGATIVE  NEGATIVE   Leukocytes, UA LARGE (*) NEGATIVE  URINE MICROSCOPIC-ADD ON     Status: Abnormal   Collection Time    06/02/2014 12:42 PM      Result Value Ref Range   Squamous Epithelial / LPF FEW (*) RARE   WBC, UA TOO NUMEROUS TO COUNT  <3 WBC/hpf   RBC / HPF 3-6  <3 RBC/hpf   Bacteria, UA MANY (*) RARE  I-STAT CG4 LACTIC ACID, ED     Status: Abnormal   Collection Time    06/09/2014 12:56 PM      Result Value Ref Range   Lactic Acid, Venous 5.85 (*) 0.5 - 2.2 mmol/L  BASIC METABOLIC PANEL     Status: Abnormal   Collection Time    05/31/2014  1:54 PM      Result Value Ref Range   Sodium 130 (*) 137 - 147 mEq/L   Potassium 4.6  3.7 - 5.3 mEq/L   Chloride 101  96 - 112 mEq/L   CO2 12 (*) 19 - 32 mEq/L   Glucose, Bld 115 (*) 70 - 99 mg/dL    BUN 21  6 - 23 mg/dL   Creatinine, Ser 2.20 (*) 0.50 - 1.10 mg/dL   Calcium 7.0 (*) 8.4 - 10.5 mg/dL   GFR calc non Af Amer 21 (*) >90 mL/min   GFR calc Af Amer 25 (*) >90 mL/min   Comment: (NOTE)     The eGFR has been calculated using the CKD EPI equation.     This calculation has not been validated in all clinical situations.     eGFR's persistently <90 mL/min signify possible Chronic Kidney     Disease.   Anion gap 17 (*)  5 - 15  MAGNESIUM     Status: Abnormal   Collection Time    05/26/2014  1:54 PM      Result Value Ref Range   Magnesium 0.9 (*) 1.5 - 2.5 mg/dL   Comment: CRITICAL RESULT CALLED TO, READ BACK BY AND VERIFIED WITH:     Wyona Almas 1902 06/13/2014 [facility]  PHOSPHORUS     Status: None   Collection Time    06/17/2014  1:54 PM      Result Value Ref Range   Phosphorus 3.3  2.3 - 4.6 mg/dL  AMYLASE     Status: None   Collection Time    05/27/2014  1:54 PM      Result Value Ref Range   Amylase 27  0 - 105 U/L  TROPONIN I     Status: Abnormal   Collection Time    05/22/2014  1:54 PM      Result Value Ref Range   Troponin I 7.79 (*) <0.30 ng/mL   Comment:            Due to the release kinetics of cTnI,     a negative result within the first hours     of the onset of symptoms does not rule out     myocardial infarction with certainty.     If myocardial infarction is still suspected,     repeat the test at appropriate intervals.     CRITICAL RESULT CALLED TO, READ BACK BY AND VERIFIED WITH:     Terance Hart 1758 06/15/2014 [facility]     CALLED AT 1858 06/01/2014  APTT     Status: Abnormal   Collection Time    05/26/2014  1:54 PM      Result Value Ref Range   aPTT 49 (*) 24 - 37 seconds   Comment:            IF BASELINE aPTT IS ELEVATED,     SUGGEST PATIENT RISK ASSESSMENT     BE USED TO DETERMINE APPROPRIATE     ANTICOAGULANT THERAPY.  PROTIME-INR     Status: Abnormal   Collection Time    06/10/2014  1:54 PM      Result Value Ref Range   Prothrombin Time 20.4 (*) 11.6 -  15.2 seconds   INR 1.75 (*) 0.00 - 1.49  VITAMIN B12     Status: None   Collection Time    05/25/2014  1:54 PM      Result Value Ref Range   Vitamin B-12 517  211 - 911 pg/mL   Comment: Performed at [place]     Status: None   Collection Time    05/24/2014  1:54 PM      Result Value Ref Range   Folate 8.6     Comment: (NOTE)     Reference Ranges            Deficient:       0.4 - 3.3 ng/mL            Indeterminate:   3.4 - 5.4 ng/mL            Normal:              > 5.4 ng/mL     Performed at [facility] TIBC     Status: Abnormal   Collection Time    05/28/2014  1:54 PM      Result Value Ref  Range   Iron <10 (*) 42 - 135 ug/dL   TIBC Not calculated due to Iron <10.  250 - 470 ug/dL   Saturation Ratios Not calculated due to Iron <10.  20 - 55 %   UIBC 305  125 - 400 ug/dL   Comment: Performed at [facility]  FERRITIN     Status: None   Collection Time    06/03/2014  1:54 PM      Result Value Ref Range   Ferritin 33  10 - 291 ng/mL   Comment: Performed at [facility]  RETICULOCYTES     Status: Abnormal   Collection Time    06/17/2014  1:54 PM      Result Value Ref Range   Retic Ct Pct 2.5  0.4 - 3.1 %   RBC. 3.24 (*) 3.87 - 5.11 MIL/uL   Retic Count, Manual 81.0  19.0 - 186.0 K/uL  LACTIC ACID, PLASMA     Status: Abnormal   Collection Time    06/03/2014  1:55 PM      Result Value Ref Range   Lactic Acid, Venous 6.9 (*) 0.5 - 2.2 mmol/L  CORTISOL     Status: None   Collection Time    05/26/2014  1:55 PM      Result Value Ref Range   Cortisol, Plasma 36.4     Comment: (NOTE)     AM:  4.3 - 22.4 ug/dL     PM:  3.1 - 16.7 ug/dL     Performed at [facility]  TSH     Status: None   Collection Time    06/17/2014  1:57 PM      Result Value Ref Range   TSH 2.120  0.350 - 4.500 uIU/mL  CBG MONITORING, ED     Status: None   Collection Time    05/27/2014  2:26 PM      Result Value Ref Range   Glucose-Capillary 88  70 -  99 mg/dL  I-STAT ARTERIAL BLOOD GAS, ED     Status: Abnormal   Collection Time    05/31/2014  2:48 PM      Result Value Ref Range   pH, Arterial 7.271 (*) 7.350 - 7.450   pCO2 arterial 28.0 (*) 35.0 - 45.0 mmHg   pO2, Arterial 95.0  80.0 - 100.0 mmHg   Bicarbonate 12.9 (*) 20.0 - 24.0 mEq/L   TCO2 14  0 - 100 mmol/L   O2 Saturation 96.0     Acid-base deficit 13.0 (*) 0.0 - 2.0 mmol/L   Collection site RADIAL, ALLEN'S TEST ACCEPTABLE     Drawn by RT     Sample type ARTERIAL    SODIUM, URINE, RANDOM     Status: None   Collection Time    06/07/2014  3:54 PM      Result Value Ref Range   Sodium, Ur 25    CREATININE, URINE, RANDOM     Status: None   Collection Time    06/19/2014  3:54 PM      Result Value Ref Range   Creatinine, Urine 91.59    OSMOLALITY, URINE     Status: Abnormal   Collection Time    06/05/2014  3:54 PM      Result Value Ref Range   Osmolality, Ur 225 (*) 390 - 1090 mOsm/kg   Comment: Performed at [facility]  URINALYSIS, ROUTINE W REFLEX MICROSCOPIC     Status: Abnormal     Collection Time    06/09/2014  3:54 PM      Result Value Ref Range   Color, Urine YELLOW  YELLOW   APPearance TURBID (*) CLEAR   Specific Gravity, Urine 1.010  1.005 - 1.030   pH 5.5  5.0 - 8.0   Glucose, UA NEGATIVE  NEGATIVE mg/dL   Hgb urine dipstick LARGE (*) NEGATIVE   Bilirubin Urine NEGATIVE  NEGATIVE   Ketones, ur NEGATIVE  NEGATIVE mg/dL   Protein, ur 100 (*) NEGATIVE mg/dL   Urobilinogen, UA 1.0  0.0 - 1.0 mg/dL   Nitrite NEGATIVE  NEGATIVE   Leukocytes, UA LARGE (*) NEGATIVE  URINE MICROSCOPIC-ADD ON     Status: Abnormal   Collection Time    06/05/2014  3:54 PM      Result Value Ref Range   Squamous Epithelial / LPF FEW (*) RARE   WBC, UA 21-50  <3 WBC/hpf   RBC / HPF 7-10  <3 RBC/hpf   Bacteria, UA MANY (*) RARE   Urine-Other AMORPHOUS URATES/PHOSPHATES    GLUCOSE, CAPILLARY     Status: Abnormal   Collection Time    06/11/2014  4:49 PM      Result Value Ref Range    Glucose-Capillary 102 (*) 70 - 99 mg/dL  CARBOXYHEMOGLOBIN     Status: Abnormal   Collection Time    05/27/2014  5:50 PM      Result Value Ref Range   Total hemoglobin 7.4 (*) 12.0 - 16.0 g/dL   O2 Saturation 63.7     Carboxyhemoglobin 0.7  0.5 - 1.5 %   Methemoglobin 1.4  0.0 - 1.5 %  POCT I-STAT 3, ART BLOOD GAS (G3+)     Status: Abnormal   Collection Time    06/05/2014  5:54 PM      Result Value Ref Range   pH, Arterial 7.099 (*) 7.350 - 7.450   pCO2 arterial 34.9 (*) 35.0 - 45.0 mmHg   pO2, Arterial 149.0 (*) 80.0 - 100.0 mmHg   Bicarbonate 10.8 (*) 20.0 - 24.0 mEq/L   TCO2 12  0 - 100 mmol/L   O2 Saturation 98.0     Acid-base deficit 18.0 (*) 0.0 - 2.0 mmol/L   Patient temperature 98.6 F     Collection site RADIAL, ALLEN'S TEST ACCEPTABLE     Drawn by Operator     Sample type ARTERIAL     Comment NOTIFIED PHYSICIAN    GLUCOSE, CAPILLARY     Status: Abnormal   Collection Time    05/27/2014  7:33 PM      Result Value Ref Range   Glucose-Capillary 160 (*) 70 - 99 mg/dL   Comment 1 Notify RN    MRSA PCR SCREENING     Status: Abnormal   Collection Time    05/29/2014  8:32 PM      Result Value Ref Range   MRSA by PCR POSITIVE (*) NEGATIVE   Comment:            The GeneXpert MRSA Assay (FDA     approved for NASAL specimens     only), is one component of a     comprehensive MRSA colonization     surveillance program. It is not     intended to diagnose MRSA     infection nor to guide or     monitor treatment for     MRSA infections.     RESULT CALLED TO, READ BACK BY AND VERIFIED WITH:  J.HOLLAND,RN 0003 06/21/14 M.CAMPBELL  TYPE AND SCREEN     Status: None   Collection Time    05/30/2014  9:00 PM      Result Value Ref Range   ABO/RH(D) B POS     Antibody Screen NEG     Sample Expiration 06/23/2014     Unit Number [id number]     Blood Component Type RED CELLS,LR     Unit division 00     Status of Unit ISSUED     Transfusion Status OK TO TRANSFUSE     Crossmatch  Result Compatible    LIPASE, BLOOD     Status: Abnormal   Collection Time    05/29/2014  9:00 PM      Result Value Ref Range   Lipase 6 (*) 11 - 59 U/L  LACTATE DEHYDROGENASE     Status: Abnormal   Collection Time    06/13/2014  9:00 PM      Result Value Ref Range   LDH 260 (*) 94 - 250 U/L  BASIC METABOLIC PANEL     Status: Abnormal   Collection Time    05/24/2014  9:00 PM      Result Value Ref Range   Sodium 132 (*) 137 - 147 mEq/L   Potassium 3.7  3.7 - 5.3 mEq/L   Comment: DELTA CHECK NOTED   Chloride 98  96 - 112 mEq/L   CO2 14 (*) 19 - 32 mEq/L   Glucose, Bld 288 (*) 70 - 99 mg/dL   BUN 21  6 - 23 mg/dL   Creatinine, Ser 2.30 (*) 0.50 - 1.10 mg/dL   Calcium 6.3 (*) 8.4 - 10.5 mg/dL   Comment: CRITICAL RESULT CALLED TO, READ BACK BY AND VERIFIED WITH:     CLIFTON J,RN 06/18/2014 2214 WAYK   GFR calc non Af Amer 20 (*) >90 mL/min   GFR calc Af Amer 24 (*) >90 mL/min   Comment: (NOTE)     The eGFR has been calculated using the CKD EPI equation.     This calculation has not been validated in all clinical situations.     eGFR's persistently <90 mL/min signify possible Chronic Kidney     Disease.   Anion gap 20 (*) 5 - 15  LACTIC ACID, PLASMA     Status: Abnormal   Collection Time    05/30/2014  9:05 PM      Result Value Ref Range   Lactic Acid, Venous 7.5 (*) 0.5 - 2.2 mmol/L  POCT I-STAT 3, ART BLOOD GAS (G3+)     Status: Abnormal   Collection Time    06/01/2014  9:27 PM      Result Value Ref Range   pH, Arterial 7.168 (*) 7.350 - 7.450   pCO2 arterial 37.4  35.0 - 45.0 mmHg   pO2, Arterial 160.0 (*) 80.0 - 100.0 mmHg   Bicarbonate 13.2 (*) 20.0 - 24.0 mEq/L   TCO2 14  0 - 100 mmol/L   O2 Saturation 99.0     Acid-base deficit 14.0 (*) 0.0 - 2.0 mmol/L   Patient temperature 102.6 F     Collection site RADIAL, ALLEN'S TEST ACCEPTABLE     Drawn by RT     Sample type ARTERIAL     Comment NOTIFIED PHYSICIAN    TROPONIN I     Status: Abnormal   Collection Time    05/28/2014   9:30 PM      Result Value Ref Range     Troponin I 10.10 (*) <0.30 ng/mL   Comment:            Due to the release kinetics of cTnI,     a negative result within the first hours     of the onset of symptoms does not rule out     myocardial infarction with certainty.     If myocardial infarction is still suspected,     repeat the test at appropriate intervals.     CRITICAL VALUE NOTED.  VALUE IS CONSISTENT WITH PREVIOUSLY REPORTED AND CALLED VALUE.  PROCALCITONIN     Status: None   Collection Time    05/25/2014  9:30 PM      Result Value Ref Range   Procalcitonin 34.48     Comment:            Interpretation:     PCT >= 10 ng/mL:     Important systemic inflammatory response,     almost exclusively due to severe bacterial     sepsis or septic shock.     (NOTE)             ICU PCT Algorithm               Non ICU PCT Algorithm        ----------------------------     ------------------------------             PCT < 0.25 ng/mL                 PCT < 0.1 ng/mL         Stopping of antibiotics            Stopping of antibiotics           strongly encouraged.               strongly encouraged.        ----------------------------     ------------------------------           PCT level decrease by               PCT < 0.25 ng/mL           >= 80% from peak PCT           OR PCT 0.25 - 0.5 ng/mL          Stopping of antibiotics                                                 encouraged.         Stopping of antibiotics               encouraged.        ----------------------------     ------------------------------           PCT level decrease by              PCT >= 0.25 ng/mL           < 80% from peak PCT            AND PCT >= 0.5 ng/mL            Continuing antibiotics                                                    encouraged.           Continuing antibiotics                encouraged.        ----------------------------     ------------------------------         PCT level increase compared           PCT > 0.5 ng/mL             with peak PCT AND              PCT >= 0.5 ng/mL             Escalation of antibiotics                                              strongly encouraged.          Escalation of antibiotics            strongly encouraged.  LACTIC ACID, PLASMA     Status: Abnormal   Collection Time    05/26/2014 11:30 PM      Result Value Ref Range   Lactic Acid, Venous 9.3 (*) 0.5 - 2.2 mmol/L  GLUCOSE, CAPILLARY     Status: Abnormal   Collection Time    06/18/2014 11:43 PM      Result Value Ref Range   Glucose-Capillary 335 (*) 70 - 99 mg/dL   Comment 1 Documented in Chart     Comment 2 Notify RN    BASIC METABOLIC PANEL     Status: Abnormal   Collection Time    06/21/14  1:18 AM      Result Value Ref Range   Sodium 129 (*) 137 - 147 mEq/L   Potassium 3.8  3.7 - 5.3 mEq/L   Chloride 97  96 - 112 mEq/L   CO2 12 (*) 19 - 32 mEq/L   Glucose, Bld 416 (*) 70 - 99 mg/dL   BUN 21  6 - 23 mg/dL   Creatinine, Ser 2.24 (*) 0.50 - 1.10 mg/dL   Calcium 6.8 (*) 8.4 - 10.5 mg/dL   GFR calc non Af Amer 21 (*) >90 mL/min   GFR calc Af Amer 24 (*) >90 mL/min   Comment: (NOTE)     The eGFR has been calculated using the CKD EPI equation.     This calculation has not been validated in all clinical situations.     eGFR's persistently <90 mL/min signify possible Chronic Kidney     Disease.   Anion gap 20 (*) 5 - 15  TROPONIN I     Status: Abnormal   Collection Time    06/21/14  1:30 AM      Result Value Ref Range   Troponin I 13.90 (*) <0.30 ng/mL   Comment:            Due to the release kinetics of cTnI,     a negative result within the first hours     of the onset of symptoms does not rule out     myocardial infarction with certainty.     If myocardial infarction is still suspected,     repeat the test at appropriate intervals.     CRITICAL VALUE NOTED.  VALUE IS CONSISTENT WITH PREVIOUSLY REPORTED AND CALLED VALUE.  CBC     Status: Abnormal     Collection Time    06/21/14   4:15 AM      Result Value Ref Range   WBC 53.4 (*) 4.0 - 10.5 K/uL   Comment: REPEATED TO VERIFY     CRITICAL RESULT CALLED TO, READ BACK BY AND VERIFIED WITH:     M HANCOCK,RN 0503 06/21/14 D [place]     WHITE COUNT CONFIRMED ON SMEAR   RBC 2.96 (*) 3.87 - 5.11 MIL/uL   Hemoglobin 6.7 (*) 12.0 - 15.0 g/dL   Comment: REPEATED TO VERIFY     CRITICAL RESULT CALLED TO, READ BACK BY AND VERIFIED WITH:     M HANCOCK,RN 0503 06/21/14 D [place]   HCT 20.2 (*) 36.0 - 46.0 %   MCV 68.2 (*) 78.0 - 100.0 fL   MCH 22.6 (*) 26.0 - 34.0 pg   MCHC 33.2  30.0 - 36.0 g/dL   RDW 19.8 (*) 11.5 - 15.5 %   Platelets 217  150 - 400 K/uL   Comment: PLATELET COUNT CONFIRMED BY SMEAR  MAGNESIUM     Status: Abnormal   Collection Time    06/21/14  4:15 AM      Result Value Ref Range   Magnesium 1.2 (*) 1.5 - 2.5 mg/dL  PHOSPHORUS     Status: None   Collection Time    06/21/14  4:15 AM      Result Value Ref Range   Phosphorus 3.6  2.3 - 4.6 mg/dL  GLUCOSE, CAPILLARY     Status: Abnormal   Collection Time    06/21/14  4:32 AM      Result Value Ref Range   Glucose-Capillary 473 (*) 70 - 99 mg/dL   Comment 1 Documented in Chart     Comment 2 Notify RN    PREPARE RBC (CROSSMATCH)     Status: None   Collection Time    06/21/14  6:00 AM      Result Value Ref Range   Order Confirmation ORDER PROCESSED BY BLOOD BANK    BASIC METABOLIC PANEL     Status: Abnormal   Collection Time    06/21/14 11:30 AM      Result Value Ref Range   Sodium 124 (*) 137 - 147 mEq/L   Potassium 3.6 (*) 3.7 - 5.3 mEq/L   Chloride 89 (*) 96 - 112 mEq/L   CO2 19  19 - 32 mEq/L   Glucose, Bld 338 (*) 70 - 99 mg/dL   BUN 23  6 - 23 mg/dL   Creatinine, Ser 2.29 (*) 0.50 - 1.10 mg/dL   Calcium 7.1 (*) 8.4 - 10.5 mg/dL   GFR calc non Af Amer 20 (*) >90 mL/min   GFR calc Af Amer 24 (*) >90 mL/min   Comment: (NOTE)     The eGFR has been calculated using the CKD EPI equation.     This calculation has not been validated in all  clinical situations.     eGFR's persistently <90 mL/min signify possible Chronic Kidney     Disease.   Anion gap 16 (*) 5 - 15  HEPATIC FUNCTION PANEL     Status: Abnormal   Collection Time    06/21/14 11:30 AM      Result Value Ref Range   Total Protein 5.0 (*) 6.0 - 8.3 g/dL   Albumin 2.0 (*) 3.5 - 5.2 g/dL   AST 103 (*) 0 - 37 U/L   ALT 27  0 - 35 U/L   Alkaline Phosphatase   75  39 - 117 U/L   Total Bilirubin 1.5 (*) 0.3 - 1.2 mg/dL   Bilirubin, Direct 1.3 (*) 0.0 - 0.3 mg/dL   Indirect Bilirubin 0.2 (*) 0.3 - 0.9 mg/dL  LACTATE DEHYDROGENASE     Status: Abnormal   Collection Time    06/21/14 11:30 AM      Result Value Ref Range   LDH 482 (*) 94 - 250 U/L  LACTIC ACID, PLASMA     Status: Abnormal   Collection Time    06/21/14 11:30 AM      Result Value Ref Range   Lactic Acid, Venous 4.9 (*) 0.5 - 2.2 mmol/L  CBC     Status: Abnormal   Collection Time    06/21/14 11:30 AM      Result Value Ref Range   WBC 48.1 (*) 4.0 - 10.5 K/uL   Comment: REPEATED TO VERIFY     WHITE COUNT CONFIRMED ON SMEAR   RBC 3.92  3.87 - 5.11 MIL/uL   Hemoglobin 8.9 (*) 12.0 - 15.0 g/dL   Comment: POST TRANSFUSION SPECIMEN   HCT 26.6 (*) 36.0 - 46.0 %   MCV 67.9 (*) 78.0 - 100.0 fL   MCH 22.7 (*) 26.0 - 34.0 pg   MCHC 33.5  30.0 - 36.0 g/dL   RDW 20.9 (*) 11.5 - 15.5 %   Platelets 173  150 - 400 K/uL   Comment: SPECIMEN CHECKED FOR CLOTS     REPEATED TO VERIFY     PLATELET COUNT CONFIRMED BY SMEAR  POCT I-STAT 3, ART BLOOD GAS (G3+)     Status: Abnormal   Collection Time    06/21/14 12:49 PM      Result Value Ref Range   pH, Arterial 7.230 (*) 7.350 - 7.450   pCO2 arterial 44.8  35.0 - 45.0 mmHg   pO2, Arterial 69.0 (*) 80.0 - 100.0 mmHg   Bicarbonate 18.6 (*) 20.0 - 24.0 mEq/L   TCO2 20  0 - 100 mmol/L   O2 Saturation 89.0     Acid-base deficit 9.0 (*) 0.0 - 2.0 mmol/L   Patient temperature 99.6 F     Collection site RADIAL, ALLEN'S TEST ACCEPTABLE     Drawn by RT     Sample type  ARTERIAL      Ct Abdomen Pelvis Wo Contrast  06/21/2014   CLINICAL DATA:  Suspected abdominal sepsis.  EXAM: CT CHEST, ABDOMEN AND PELVIS WITHOUT CONTRAST  TECHNIQUE: Multidetector CT imaging of the chest, abdomen and pelvis was performed following the standard protocol without IV contrast.  COMPARISON:  02/08/2012  FINDINGS: CT CHEST FINDINGS  Endotracheal and enteric tubes are in place. Central venous catheter with tip in the brachiocephalic vein. Normal heart size. Aortic valve and coronary artery calcifications. Calcification of thoracic aorta without aneurysm. Esophagus is decompressed. No significant lymphadenopathy in the chest.  Small bilateral pleural effusions. Diffuse patchy infiltrative process throughout the lungs may represent edema or pneumonia. Scattered emphysematous changes are present. No pneumothorax.  CT ABDOMEN AND PELVIS FINDINGS  There is moderate diffuse free fluid and edema throughout the abdomen and retroperitoneum. Diffuse colonic wall thickening. Changes likely due to colitis. This could indicate pseudomembranous colitis or infectious or inflammatory colitis. No loculated fluid to suggest abscess. The unenhanced appearance of the liver, spleen, pancreas, gallbladder, kidneys, inferior vena cava, and retroperitoneal lymph nodes is grossly unremarkable. Calcification of the abdominal aorta without aneurysm. No free air in the abdomen.  Pelvis: Bladder is decompressed with   a Foley catheter. No pelvic mass or lymphadenopathy.  Bones: Degenerative changes in the thoracic and lumbar spine. No vertebral compression deformities are displaced fractures identified. Sclerosis in the proximal left femur was present on previous study is likely benign. Postoperative changes in the left pelvis.  IMPRESSION: Patchy diffuse infiltrative changes in the lungs may represent edema or pneumonia.  Moderate diffuse free fluid and edema throughout the abdomen, mesenteric, and retroperitoneum. Diffuse colonic  wall thickening. Changes likely due to colitis of nonspecific etiology.   Electronically Signed   By: Lucienne Capers M.D.   On: 06/21/2014 00:43   Ct Chest Wo Contrast  06/21/2014   CLINICAL DATA:  Suspected abdominal sepsis.  EXAM: CT CHEST, ABDOMEN AND PELVIS WITHOUT CONTRAST  TECHNIQUE: Multidetector CT imaging of the chest, abdomen and pelvis was performed following the standard protocol without IV contrast.  COMPARISON:  02/08/2012  FINDINGS: CT CHEST FINDINGS  Endotracheal and enteric tubes are in place. Central venous catheter with tip in the brachiocephalic vein. Normal heart size. Aortic valve and coronary artery calcifications. Calcification of thoracic aorta without aneurysm. Esophagus is decompressed. No significant lymphadenopathy in the chest.  Small bilateral pleural effusions. Diffuse patchy infiltrative process throughout the lungs may represent edema or pneumonia. Scattered emphysematous changes are present. No pneumothorax.  CT ABDOMEN AND PELVIS FINDINGS  There is moderate diffuse free fluid and edema throughout the abdomen and retroperitoneum. Diffuse colonic wall thickening. Changes likely due to colitis. This could indicate pseudomembranous colitis or infectious or inflammatory colitis. No loculated fluid to suggest abscess. The unenhanced appearance of the liver, spleen, pancreas, gallbladder, kidneys, inferior vena cava, and retroperitoneal lymph nodes is grossly unremarkable. Calcification of the abdominal aorta without aneurysm. No free air in the abdomen.  Pelvis: Bladder is decompressed with a Foley catheter. No pelvic mass or lymphadenopathy.  Bones: Degenerative changes in the thoracic and lumbar spine. No vertebral compression deformities are displaced fractures identified. Sclerosis in the proximal left femur was present on previous study is likely benign. Postoperative changes in the left pelvis.  IMPRESSION: Patchy diffuse infiltrative changes in the lungs may represent edema  or pneumonia.  Moderate diffuse free fluid and edema throughout the abdomen, mesenteric, and retroperitoneum. Diffuse colonic wall thickening. Changes likely due to colitis of nonspecific etiology.   Electronically Signed   By: Lucienne Capers M.D.   On: 06/21/2014 00:43   US Renal Port  05/24/2014   CLINICAL DATA:  Elevated creatinine.  EXAM: RENAL/URINARY TRACT ULTRASOUND COMPLETE  COMPARISON:  Ultrasound kidneys 02/09/2012.  CT 02/08/2012.  FINDINGS: Right Kidney:  Length: 11.6 cm. Echogenicity within normal limits. No mass or hydronephrosis visualized.  Left Kidney:  Length: 11.6 cm. Echogenicity within normal limits. No mass or hydronephrosis visualized.  Bladder:  Bladder is decompressed with a Foley catheter in place.  IMPRESSION: Normal ultrasound appearance of the kidneys.  No hydronephrosis.   Electronically Signed   By: Lucienne Capers M.D.   On: 06/04/2014 23:09   Dg Chest Port 1 View  06/21/2014   CLINICAL DATA:  Assess endotracheal and OG tubes  EXAM: PORTABLE CHEST - 1 VIEW  COMPARISON:  06/18/2014  FINDINGS: Endotracheal tube tip measures 1.5 cm above the carinal. Enteric tube tip is off the field of view but below the left hemidiaphragm consistent with location at least in the stomach. Cardiac enlargement. Bilateral perihilar infiltration suggesting edema. Slight blunting of costophrenic angles suggests small pleural effusions. No pneumothorax.  IMPRESSION: Appliances appear to be in satisfactory location. Cardiac enlargement  with pulmonary vascular congestion and edema. Probable small pleural effusions.   Electronically Signed   By: William  Stevens M.D.   On: 06/21/2014 04:07   Dg Chest Port 1 View  06/05/2014   CLINICAL DATA:  SS endotracheal tube placement ; septic shock ; CHF  EXAM: PORTABLE CHEST - 1 VIEW  COMPARISON:  Portable chest x-ray of June 20, 2014 at 1652 hr  FINDINGS: The patient is rotated on this study. The endotracheal tube tip projects approximately 3.1 cm above  the crotch of the carina. The left internal jugular venous catheter tip lies at the level of the junction of the left internal jugular vein with the left subclavian vein. The cardiopericardial silhouette is mildly enlarged. The left hemidiaphragm is partially obscured. The pulmonary vascularity is engorged, and there is pulmonary interstitial edema. The observed bony structures are unremarkable. There is mild gaseous distention of the stomach unchanged from earlier today.  IMPRESSION: The endotracheal tube tip lies approximately 3.1 cm above the crotch of the carina. There has not been significant interval change in the appearance of the chest since the earlier study.   Electronically Signed   By: David  Jordan   On: 06/03/2014 17:31   Dg Chest Port 1 View  06/07/2014   CLINICAL DATA:  Central line placement.  EXAM: PORTABLE CHEST - 1 VIEW  COMPARISON:  06/08/2014  FINDINGS: The left IJ catheter tip is in the left brachiocephalic vein and should be advanced approximately 6-7 cm. The heart and lungs are unchanged.  IMPRESSION: Left IJ catheter tip is in the left brachiocephalic vein.   Electronically Signed   By: Mark  Gallerani M.D.   On: 05/25/2014 17:08   Dg Chest Port 1 View  05/24/2014   CLINICAL DATA:  Shortness of breath, chest pain  EXAM: PORTABLE CHEST - 1 VIEW  COMPARISON:  12/12/2012  FINDINGS: Bilateral interstitial and alveolar airspace opacities. No significant pleural effusion or pneumothorax. No focal consolidation. Stable cardiomediastinal silhouette.  The osseous structures are unremarkable.  IMPRESSION: Findings most concerning for mild pulmonary edema.   Electronically Signed   By: Hetal  Patel   On: 06/13/2014 12:55   Dg Abd Portable 1v  06/21/2014   CLINICAL DATA:  Assess ET and OG tube.  EXAM: PORTABLE ABDOMEN - 1 VIEW  COMPARISON:  None.  FINDINGS: Enteric tube tip is in the lower abdomen centrally, consistent with location in the distal stomach or proximal duodenum.  IMPRESSION:  Enteric tube tip localizes over the mid abdomen consistent with location in the distal stomach or proximal duodenum.   Electronically Signed   By: William  Stevens M.D.   On: 06/21/2014 04:07    Review of Systems  Unable to perform ROS: intubated   Blood pressure 64/45, pulse 126, temperature 99.7 F (37.6 C), temperature source Oral, resp. rate 35, height 4' 11.06" (1.5 m), weight 171 lb 1.2 oz (77.6 kg), SpO2 91.00%. Physical Exam  Constitutional: She appears well-developed.  HENT:  Head: Normocephalic.  Mouth/Throat: Oropharynx is clear and moist. No oropharyngeal exudate.  Oral endotracheal tube  Neck: Neck supple. No tracheal deviation present.  Cardiovascular:  Tachycardic 120s  Respiratory: She has no wheezes. She has no rales.  On the ventilator, clear  GI: Soft. She exhibits no distension. There is no tenderness. There is no rebound and no guarding.  Quiet, no appreciable tenderness, no peritonitis  Musculoskeletal:  Right AKA  Neurological:  sedated  Skin: Skin is warm.    Assessment/Plan: Colitis, likely ischemic.   Clostridium difficile seems less likely, though I agree with checking that. This explosive diarrhea may have been a sign of some ongoing low-grade colonic ischemia which worsened with her current illness. She has severe abdominal vascular atherosclerotic disease. Agree with supportive care and ongoing resuscitation. If her colon worsens, she may require abdominal colectomy with ileostomy. She is not a good surgical candidate. I discussed this with her daughter. Her daughter feels she may not want this type of surgery. I encouraged her to discuss this with the rest of her family. We will follow closely. I also spoke with Dr. Titus Wiley.  Marjon Doxtater E 06/21/2014, 1:31 PM

## 2014-06-21 NOTE — H&P (Signed)
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   Initial complaints of AMS & low blood sugar.    STUDIES:  9/30 renal- port- Normal Uss appearance of kidneys, bladder decompressed with foley cath in place, no hydronephrosis. 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  Pt has a permanent indwelling cath, changed once a month, also has fecal impaction requiring manual disimpaction- 3x/wk by HHN.    Upon arrival, pt was noted to be tachycardic and hypotensive.  Lab work notable for metabolic acidosis, AG 22, Na 122, Cr 2.50 (baseline ~2), lactic acid 5.85, WBC 35.6, Hgb 7.7 (baseline ~ 9) and CXR with bilateral lower haziness concerning for possible effusions vs infiltrate.  Patient had one episode of soft stool on arrival. Pt was treated with broad spectrum antibiotics & volume resuscitated but remained hypotensive.  Levophed was started to maintain MAP.  Concerns for sepsis with possible urinary source, PCCM called for ICU admission.    SUBJECTIVE: ETT in place, sedated, pressors remain  VITAL  SIGNS: Temp:  [98.6 F (37 C)-102.6 F (39.2 C)] 98.7 F (37.1 C) (10/01 0803) Pulse Rate:  [61-159] 114 (10/01 0714) Resp:  [12-38] 29 (10/01 0714) BP: (45-119)/(15-79) 82/65 mmHg (10/01 0714) SpO2:  [65 %-100 %] 97 % (10/01 0714) FiO2 (%):  [40 %-50 %] 40 % (10/01 0714) Weight:  [161 lb (73.029 kg)-171 lb 1.2 oz (77.6 kg)] 171 lb 1.2 oz (77.6 kg) (10/01 0444)  HEMODYNAMICS: CVP:  [12 mmHg-16 mmHg] 16 mmHg  VENTILATOR SETTINGS: Vent Mode:  [-] PRVC FiO2 (%):  [40 %-50 %] 40 % Set Rate:  [20 bmp-28 bmp] 28 bmp Vt Set:  [380 mL] 380 mL PEEP:  [8 cmH20] 8 cmH20 Plateau Pressure:  [15 cmH20-23 cmH20] 17 cmH20  INTAKE / OUTPUT:  Intake/Output Summary (Last 24 hours) at 06/21/14 0838 Last data filed at 06/21/14 0700  Gross per 24 hour  Intake 12276.89 ml  Output    336 ml  Net 11940.89 ml    PHYSICAL EXAMINATION: General:  Sedated  Neuro:  rass -1, moves all ext, follows commands HEENT:  NCAT, some JVD Cardiovascular:  s1 s2 Tachy, no m/r/g Lungs:  Reduced, scatter ronchi rt  Abdomen:  Soft, NT, distended mild, no r/g Musculoskeletal:  S/p R AKA, pulses present Skin:  Heel ulcer R, no necrotic tissue, red/pink granulation tissue surrounded by min maceration, quarter in size   LABS:  CBC  Recent Labs Lab 06/17/2014 1220 06/21/14 0415  WBC 35.6* 53.4*  HGB 7.7* 6.7*  HCT 23.0* 20.2*  PLT 349 217   Coag's  Recent Labs Lab 05/22/2014 1354  APTT 49*  INR 1.75*    BMET  Recent Labs Lab 06/07/2014 1354 06/14/2014 2100 06/21/14 0118  NA 130* 132* 129*  K 4.6 3.7 3.8  CL 101 98 97  CO2 12* 14* 12*  BUN 21 21 21   CREATININE 2.20* 2.30* 2.24*  GLUCOSE 115* 288* 416*   Electrolytes  Recent Labs Lab 06/02/2014 1354 05/22/2014 2100 06/21/14 0118 06/21/14 0415  CALCIUM 7.0* 6.3* 6.8*  --   MG 0.9*  --   --  1.2*  PHOS 3.3  --   --  3.6   Sepsis Markers  Recent Labs Lab 06/07/2014 1355 05/31/2014 2105 05/31/2014 2130 06/09/2014 2330  LATICACIDVEN 6.9* 7.5*   --  9.3*  PROCALCITON  --   --  34.48  --    ABG  Recent Labs Lab 06/13/2014 1448 06/09/2014 1754 06/14/2014 2127  PHART 7.271* 7.099* 7.168*  PCO2ART 28.0* 34.9* 37.4  PO2ART 95.0 149.0* 160.0*    Liver Enzymes  Recent Labs Lab 06/03/2014 1220  AST 41*  ALT 19  ALKPHOS 101  BILITOT 0.9  ALBUMIN 2.9*   Cardiac Enzymes  Recent Labs Lab 06/16/2014 1354 06/15/2014 2130 06/21/14 0130  TROPONINI 7.79* 10.10* 13.90*    Glucose  Recent Labs Lab 06/04/2014 1426 05/23/2014 1649 05/30/2014 1933 05/25/2014 2343 06/21/14 0432  GLUCAP 88 102* 160* 335* 473*    Imaging US Renal Port  06/17/2014   CLINICAL DATA:  Elevated creatinine.  EXAM: RENAL/URINARY TRACT ULTRASOUND COMPLETE  COMPARISON:  Ultrasound kidneys 02/09/2012.  CT 02/08/2012.  FINDINGS: Right Kidney:  Length: 11.6 cm. Echogenicity within normal limits. No mass or hydronephrosis visualized.  Left Kidney:  Length: 11.6 cm. Echogenicity within normal limits. No mass or hydronephrosis visualized.  Bladder:  Bladder is decompressed with a Foley catheter in place.  IMPRESSION: Normal ultrasound appearance of the kidneys.  No hydronephrosis.   Electronically Signed   By: Lucienne Capers M.D.   On: 05/30/2014 23:09   Dg Chest Port 1 View  06/07/2014   CLINICAL DATA:  SS endotracheal tube placement ; septic shock ; CHF  EXAM: PORTABLE CHEST - 1 VIEW  COMPARISON:  Portable chest x-ray of June 20, 2014 at 1652 hr  FINDINGS: The patient is rotated on this study. The endotracheal tube tip projects approximately 3.1 cm above the crotch of the carina. The left internal jugular venous catheter tip lies at the level of the junction of the left internal jugular vein with the left subclavian vein. The cardiopericardial silhouette is mildly enlarged. The left hemidiaphragm is partially obscured. The pulmonary vascularity is engorged, and there is pulmonary interstitial edema. The observed bony structures are unremarkable. There is mild gaseous  distention of the stomach unchanged from earlier today.  IMPRESSION: The endotracheal tube tip lies approximately 3.1 cm above the crotch of the carina. There has not been significant interval change in the appearance of the chest since the earlier study.   Electronically Signed   By: David  Martinique   On: 06/04/2014 17:31   Dg Chest Port 1 View  05/23/2014   CLINICAL DATA:  Central line placement.  EXAM: PORTABLE CHEST - 1 VIEW  COMPARISON:  06/19/2014  FINDINGS: The left IJ catheter tip is in the left brachiocephalic vein and should be advanced approximately 6-7 cm. The heart and lungs are unchanged.  IMPRESSION: Left IJ catheter tip is in the left brachiocephalic vein.   Electronically Signed   By: Veneta Penton.D.  On: 06/04/2014 17:08   Dg Chest Port 1 View  05/24/2014   CLINICAL DATA:  Shortness of breath, chest pain  EXAM: PORTABLE CHEST - 1 VIEW  COMPARISON:  12/12/2012  FINDINGS: Bilateral interstitial and alveolar airspace opacities. No significant pleural effusion or pneumothorax. No focal consolidation. Stable cardiomediastinal silhouette.  The osseous structures are unremarkable.  IMPRESSION: Findings most concerning for mild pulmonary edema.   Electronically Signed   By: Kathreen Devoid   On: 05/28/2014 12:55     ASSESSMENT / PLAN:  PULMONARY ETT   A: Dyspnea / Tacyhpnea  - Pulmonary congestion on chest xray today-  But still consider right lower lobe opacity might be early PNA. Tobacco Abuse Acute resp failure - On Vent  Metabolic Acidosis, not compensated, high Po2-160, PH- 7.168 on ABG-9/30, adjusted, now on Fio2- 40%  P:  Retract ett 1 cm pcx rin am for development of bilat infiltrates Empiric abx, see ID Repeat ABG this AM, consider reduce PEEP if Po2 >70 Consider re attempt rate to 35, repeat abg Concern sat not correlating, abg to follow  CARDIOVASCULAR CVL A:  Septic shock - suspected urinary source, +/- pulmonary component  CHF (EF 60-65% 01/2012) Hx  HTN NSTEMI/demand ischemia ?Cardiogenic Shock component CVP- 16  P:  Cont Levophed to MAP goal 65, restart this to have goal epi reduction Vasopressin, dc as risk mesenteric ischemia Dc bicarb, see renal Assess CVP trends Hold home amlodipine, HCTZ, Lopressor HCTZ Source control, see ID Goal is to dc epi if able with risks tachy, vt etc No role neo as such higher dose levo, weak pressor Echo on order No heparin with drop HGB Asa hold for same reason  RENAL A:  Acute on Chronic Renal Failure Hyponatremia - likely in setting of volume depletion + chronic HCTZ   Oliguric Metabolic acidosis- Lactic acidosis Hyponatremia- Improving Hypomag- 1.2, s/p repletion,from initial 0.9 Hypo Calcemia- ca- 6.3 repleted P:   Monitor BMP Correct phos Ensure renal perfusion with MAP >65 Dc bicarb as will turn into co2 and inhibit ventilation Bicarb if k elevated co2 on chem has not improved bmet q8h  GI/GU A:  Chronic Fecal Impaction Neurogenic bladder R/o abdominal source as seems sicker than urine simple infection alone Also consider Colitis, with elevated WBC- 53.4, CT with findings of bowel thickening - ischemic bowel of concern P:   Manual disimpaction 3x/week NPO  PPI Send cdiff, unlikely LF now May need surgical evaluation Assess lactic repeat, ldh  HEMATOLOGIC A:  Iron deficiency anemia - pt formerly on iron but stopped after she was told it improved Hgb- 6.7 leukocytosis  P:  Iron supplementation dc Monitor CBC q8h DVT prophylaxis, SQ heparin for hold for now Transfuse 1 unit then cbc to follow  INFECTIOUS A:  Septic shock - suspected urinary source, rule out pulmonary, PNA right R/o abdo ischemia vs infectious colitis Chronic LLE Heel Wound MRSA- PCR+  P:   BCx2 9/29 >> UC 9/29 >> UA 9/29 >>   Abx: vanc, start date 9/29>>> Abx: zosyn, start date 9/29>>> Abx: Diflucan Started 9/30>>>10/1 Abx- levaquin started 9/30>>>  Consider surgical  consult  ENDOCRINE A:  Hypothyroid  DM II R/o rel AI P:   Continue levothyroxine SSI TSH wnl Cortisol acaudate, dc roids  NEUROLOGIC A:   Chronic pain Hx paraplegia s/p spinal fx Insomnia  P:   RASS goal: -2 Hold home baclofen, gabapentin, oxycodone   Family bedside updated , duaghter and husband  Interdisciplinary Family Meeting v Palliative  Care Meeting: n/a   TODAY'S SUMMARY: improved BP, surgery to see colitis? Ishcmia? Likely, dc epi as goal, increase MV further    I have personally obtained a history, examined the patient, evaluated laboratory and imaging results, formulated the assessment and plan and placed orders.  CRITICAL CARE: The patient is critically ill with multiple organ systems failure and requires high complexity decision making for assessment and support, frequent evaluation and titration of therapies, application of advanced monitoring technologies and extensive interpretation of multiple databases. Critical Care Time devoted to patient care services described in this note is 55 minutes.    Bing Neighbors, MD PGY-2 IMTS 06/21/2014, 8:38 AM  I have fully examined this patient and agree with above findings.    And edited infull  Lavon Paganini. Titus Mould, MD, [place] Pgr: [facility]

## 2014-06-21 NOTE — Consult Note (Signed)
WOC wound consult note Reason for Consult: Consult requested for left heel wound. Wound type: Stage 3 chronic healing wound Pressure Ulcer POA: Yes Measurement: 3X4cm with open area in center 2X2X.1cm Wound bed: Pink and moist Drainage (amount, consistency, odor) small amt yellow drainage, no odor Periwound: surrounded by pink dry scar tissue Dressing procedure/placement/frequency: Foam dressing to protect and promote healing.  Float heel to reduce pressure. Please re-consult if further assistance is needed.  Thank-you,  Julien Girt MSN, [place], [place], [place], [place]

## 2014-06-21 NOTE — Progress Notes (Signed)
CRITICAL VALUE ALERT  Critical value received:  +MRSA PCR  Date of notification:  06/21/2014  Time of notification:  0004  Critical value read back:yes  Nurse who received alert:  Corrinne Eagle RN  MD notified (1st page):  Rockwell [place]  Time of first page:  0004  MD notified (2nd page):NA  Time of second page:NA  Responding MD:  Domingo Mend  Time MD responded:  [phone]

## 2014-06-21 NOTE — Progress Notes (Signed)
eLink Physician-Brief Progress Note Patient Name: Alexandra Wiley DOB: [birth year] MRN: [id number]   Date of Service  06/21/2014  HPI/Events of Note  -related to stress, epi gtt, solucortef & bicarb gtt  eICU Interventions  Insulin gtt     Intervention Category Intermediate Interventions: Hyperglycemia - evaluation and treatment  Alexandra Mickelson V. 06/21/2014, 4:57 AM

## 2014-06-21 DEATH — deceased

## 2014-06-22 ENCOUNTER — Inpatient Hospital Stay (HOSPITAL_COMMUNITY): Payer: Medicare Other

## 2014-06-22 ENCOUNTER — Encounter (HOSPITAL_BASED_OUTPATIENT_CLINIC_OR_DEPARTMENT_OTHER): Payer: Medicare Other | Attending: General Surgery

## 2014-06-22 DIAGNOSIS — I517 Cardiomegaly: Secondary | ICD-10-CM

## 2014-06-22 LAB — COMPREHENSIVE METABOLIC PANEL
ALT: 28 U/L (ref 0–35)
AST: 99 U/L — ABNORMAL HIGH (ref 0–37)
Albumin: 1.9 g/dL — ABNORMAL LOW (ref 3.5–5.2)
Alkaline Phosphatase: 74 U/L (ref 39–117)
Anion gap: 12 (ref 5–15)
BILIRUBIN TOTAL: 1.4 mg/dL — AB (ref 0.3–1.2)
BUN: 26 mg/dL — ABNORMAL HIGH (ref 6–23)
CO2: 21 meq/L (ref 19–32)
Calcium: 7.7 mg/dL — ABNORMAL LOW (ref 8.4–10.5)
Chloride: 93 mEq/L — ABNORMAL LOW (ref 96–112)
Creatinine, Ser: 2.47 mg/dL — ABNORMAL HIGH (ref 0.50–1.10)
GFR, EST AFRICAN AMERICAN: 22 mL/min — AB (ref >90)
GFR, EST NON AFRICAN AMERICAN: 19 mL/min — AB (ref >90)
GLUCOSE: 135 mg/dL — AB (ref 70–99)
POTASSIUM: 4.2 meq/L (ref 3.7–5.3)
Sodium: 126 mEq/L — ABNORMAL LOW (ref 137–147)
Total Protein: 4.9 g/dL — ABNORMAL LOW (ref 6.0–8.3)

## 2014-06-22 LAB — GLUCOSE, CAPILLARY
GLUCOSE-CAPILLARY: 117 mg/dL — AB (ref 70–99)
Glucose-Capillary: 100 mg/dL — ABNORMAL HIGH (ref 70–99)
Glucose-Capillary: 104 mg/dL — ABNORMAL HIGH (ref 70–99)
Glucose-Capillary: 107 mg/dL — ABNORMAL HIGH (ref 70–99)
Glucose-Capillary: 130 mg/dL — ABNORMAL HIGH (ref 70–99)
Glucose-Capillary: 152 mg/dL — ABNORMAL HIGH (ref 70–99)

## 2014-06-22 LAB — BLOOD GAS, ARTERIAL
Acid-base deficit: 5.6 mmol/L — ABNORMAL HIGH (ref 0.0–2.0)
Acid-base deficit: 4.6 mmol/L — ABNORMAL HIGH (ref 0.0–2.0)
Bicarbonate: 20.1 mEq/L (ref 20.0–24.0)
Bicarbonate: 20.6 mEq/L (ref 20.0–24.0)
Drawn by: 31101
FIO2: 0.6 %
FIO2: 0.6 %
MECHVT: 380 mL
O2 Saturation: 91.2 %
O2 Saturation: 95.6 %
PATIENT TEMPERATURE: 98.6
PATIENT TEMPERATURE: 98.1
PCO2 ART: 44.7 mmHg (ref 35.0–45.0)
PEEP/CPAP: 8 cmH2O
PEEP: 8 cmH2O
RATE: 35 resp/min
RATE: 35 resp/min
TCO2: 21.4 mmol/L (ref 0–100)
TCO2: 21.9 mmol/L (ref 0–100)
VT: 380 mL
pCO2 arterial: 41.5 mmHg (ref 35.0–45.0)
pH, Arterial: 7.275 — ABNORMAL LOW (ref 7.350–7.450)
pH, Arterial: 7.315 — ABNORMAL LOW (ref 7.350–7.450)
pO2, Arterial: 69.4 mmHg — ABNORMAL LOW (ref 80.0–100.0)
pO2, Arterial: 88.4 mmHg (ref 80.0–100.0)

## 2014-06-22 LAB — CBC WITH DIFFERENTIAL/PLATELET
Basophils Absolute: 0 10*3/uL (ref 0.0–0.1)
Basophils Relative: 0 % (ref 0–1)
EOS ABS: 0 10*3/uL (ref 0.0–0.7)
EOS PCT: 0 % (ref 0–5)
HCT: 25.3 % — ABNORMAL LOW (ref 36.0–46.0)
Hemoglobin: 8.6 g/dL — ABNORMAL LOW (ref 12.0–15.0)
LYMPHS ABS: 1.5 10*3/uL (ref 0.7–4.0)
Lymphocytes Relative: 3 % — ABNORMAL LOW (ref 12–46)
MCH: 22.7 pg — AB (ref 26.0–34.0)
MCHC: 34 g/dL (ref 30.0–36.0)
MCV: 66.8 fL — AB (ref 78.0–100.0)
Monocytes Absolute: 4.9 10*3/uL — ABNORMAL HIGH (ref 0.1–1.0)
Monocytes Relative: 10 % (ref 3–12)
Neutro Abs: 42.2 10*3/uL — ABNORMAL HIGH (ref 1.7–7.7)
Neutrophils Relative %: 87 % — ABNORMAL HIGH (ref 43–77)
PLATELETS: 121 10*3/uL — AB (ref 150–400)
RBC: 3.79 MIL/uL — AB (ref 3.87–5.11)
RDW: 20.2 % — AB (ref 11.5–15.5)
WBC: 48.6 10*3/uL — ABNORMAL HIGH (ref 4.0–10.5)

## 2014-06-22 LAB — CBC
HCT: 24.3 % — ABNORMAL LOW (ref 36.0–46.0)
HCT: 23.1 % — ABNORMAL LOW (ref 36.0–46.0)
Hemoglobin: 8.3 g/dL — ABNORMAL LOW (ref 12.0–15.0)
Hemoglobin: 8.1 g/dL — ABNORMAL LOW (ref 12.0–15.0)
MCH: 22.7 pg — ABNORMAL LOW (ref 26.0–34.0)
MCH: 22.8 pg — AB (ref 26.0–34.0)
MCHC: 34.2 g/dL (ref 30.0–36.0)
MCHC: 35.1 g/dL (ref 30.0–36.0)
MCV: 66.6 fL — AB (ref 78.0–100.0)
MCV: 64.9 fL — ABNORMAL LOW (ref 78.0–100.0)
PLATELETS: 106 10*3/uL — AB (ref 150–400)
Platelets: 113 10*3/uL — ABNORMAL LOW (ref 150–400)
RBC: 3.65 MIL/uL — ABNORMAL LOW (ref 3.87–5.11)
RBC: 3.56 MIL/uL — ABNORMAL LOW (ref 3.87–5.11)
RDW: 20.1 % — AB (ref 11.5–15.5)
RDW: 20 % — ABNORMAL HIGH (ref 11.5–15.5)
WBC: 44.7 10*3/uL — ABNORMAL HIGH (ref 4.0–10.5)
WBC: 36.1 10*3/uL — ABNORMAL HIGH (ref 4.0–10.5)

## 2014-06-22 LAB — POCT I-STAT 3, ART BLOOD GAS (G3+)
ACID-BASE DEFICIT: 5 mmol/L — AB (ref 0.0–2.0)
Bicarbonate: 21.8 mEq/L (ref 20.0–24.0)
O2 Saturation: 97 %
PO2 ART: 99 mmHg (ref 80.0–100.0)
Patient temperature: 99
TCO2: 23 mmol/L (ref 0–100)
pCO2 arterial: 47.5 mmHg — ABNORMAL HIGH (ref 35.0–45.0)
pH, Arterial: 7.27 — ABNORMAL LOW (ref 7.350–7.450)

## 2014-06-22 LAB — TYPE AND SCREEN
ABO/RH(D): B POS
Antibody Screen: NEGATIVE
UNIT DIVISION: 0

## 2014-06-22 LAB — MAGNESIUM: MAGNESIUM: 2.1 mg/dL (ref 1.5–2.5)

## 2014-06-22 MED ORDER — PERFLUTREN LIPID MICROSPHERE
1.0000 mL | Freq: Once | INTRAVENOUS | Status: AC
  Administered 2014-06-22: 2 mL via INTRAVENOUS

## 2014-06-22 MED ORDER — VITAL HIGH PROTEIN PO LIQD
1000.0000 mL | ORAL | Status: DC
  Administered 2014-06-22: 1000 mL
  Administered 2014-06-23 – 2014-06-24 (×25)
  Administered 2014-06-25 – 2014-06-26 (×2): 1000 mL
  Administered 2014-06-26: 12:00:00
  Filled 2014-06-22 (×8): qty 1000

## 2014-06-22 MED ORDER — INFLUENZA VAC SPLIT QUAD 0.5 ML IM SUSY
0.5000 mL | PREFILLED_SYRINGE | INTRAMUSCULAR | Status: DC
  Filled 2014-06-22: qty 0.5

## 2014-06-22 MED ORDER — INSULIN ASPART 100 UNIT/ML ~~LOC~~ SOLN
0.0000 [IU] | SUBCUTANEOUS | Status: DC
  Administered 2014-06-22: 2 [IU] via SUBCUTANEOUS
  Administered 2014-06-22 – 2014-06-23 (×2): 1 [IU] via SUBCUTANEOUS
  Administered 2014-06-26 (×2): 2 [IU] via SUBCUTANEOUS

## 2014-06-22 NOTE — Progress Notes (Signed)
Made MD aware of CBG. Would like for insulin drip to stay off and to be notified if cbg >200.

## 2014-06-22 NOTE — Progress Notes (Signed)
[facility] Progress Note     Subjective: Pt critically ill, intubated on vent.  Not responsive for me this am.  Did not follow commands.  Objective: Vital signs in last 24 hours: Temp:  [98.7 F (37.1 C)-100.4 F (38 C)] 100.4 F (38 C) (10/02 0741) Pulse Rate:  [109-129] 113 (10/02 0703) Resp:  [6-36] 34 (10/02 0703) BP: (64-144)/(45-94) 125/59 mmHg (10/02 0703) SpO2:  [88 %-97 %] 93 % (10/02 0703) Arterial Line BP: (108-179)/(45-80) 138/63 mmHg (10/02 0600) FiO2 (%):  [40 %-60 %] 60 % (10/02 0703) Last BM Date: 05/27/2014  Intake/Output from previous day: 10/01 0701 - 10/02 0700 In: 2801.4 [I.V.:2176.4; Blood:335; NG/GT:90; IV Piggyback:200] Out: 1700 [Urine:1700] Intake/Output this shift:    PE: Gen:  Critically ill, sedated on vent Card:  tachy, no M/G/R heard Pulm:  On vent Abd: Soft, distended, tender in the upper abdomen, diminished BS, no HSM Ext:  Right AKA, left foot cool, with +DP pulse  Lab Results:   Recent Labs  06/21/14 1840 06/22/14 0500  WBC 48.7* 48.6*  HGB 9.0* 8.6*  HCT 26.9* 25.3*  PLT 154 121*   BMET  Recent Labs  06/21/14 1130 06/22/14 0500  NA 124* 126*  K 3.6* 4.2  CL 89* 93*  CO2 19 21  GLUCOSE 338* 135*  BUN 23 26*  CREATININE 2.29* 2.47*  CALCIUM 7.1* 7.7*   PT/INR  Recent Labs  06/16/2014 1354  LABPROT 20.4*  INR 1.75*   CMP     Component Value Date/Time   NA 126* 06/22/2014 0500   K 4.2 06/22/2014 0500   CL 93* 06/22/2014 0500   CO2 21 06/22/2014 0500   GLUCOSE 135* 06/22/2014 0500   BUN 26* 06/22/2014 0500   CREATININE 2.47* 06/22/2014 0500   CALCIUM 7.7* 06/22/2014 0500   PROT 4.9* 06/22/2014 0500   ALBUMIN 1.9* 06/22/2014 0500   AST 99* 06/22/2014 0500   ALT 28 06/22/2014 0500   ALKPHOS 74 06/22/2014 0500   BILITOT 1.4* 06/22/2014 0500   GFRNONAA 19* 06/22/2014 0500   GFRAA 22* 06/22/2014 0500   Lipase     Component Value Date/Time   LIPASE 6* 06/11/2014 2100       Studies/Results: Ct Abdomen  Pelvis Wo Contrast  06/21/2014   CLINICAL DATA:  Suspected abdominal sepsis.  EXAM: CT CHEST, ABDOMEN AND PELVIS WITHOUT CONTRAST  TECHNIQUE: Multidetector CT imaging of the chest, abdomen and pelvis was performed following the standard protocol without IV contrast.  COMPARISON:  02/08/2012  FINDINGS: CT CHEST FINDINGS  Endotracheal and enteric tubes are in place. Central venous catheter with tip in the brachiocephalic vein. Normal heart size. Aortic valve and coronary artery calcifications. Calcification of thoracic aorta without aneurysm. Esophagus is decompressed. No significant lymphadenopathy in the chest.  Small bilateral pleural effusions. Diffuse patchy infiltrative process throughout the lungs may represent edema or pneumonia. Scattered emphysematous changes are present. No pneumothorax.  CT ABDOMEN AND PELVIS FINDINGS  There is moderate diffuse free fluid and edema throughout the abdomen and retroperitoneum. Diffuse colonic wall thickening. Changes likely due to colitis. This could indicate pseudomembranous colitis or infectious or inflammatory colitis. No loculated fluid to suggest abscess. The unenhanced appearance of the liver, spleen, pancreas, gallbladder, kidneys, inferior vena cava, and retroperitoneal lymph nodes is grossly unremarkable. Calcification of the abdominal aorta without aneurysm. No free air in the abdomen.  Pelvis: Bladder is decompressed with a Foley catheter. No pelvic mass or lymphadenopathy.  Bones: Degenerative changes in the thoracic  and lumbar spine. No vertebral compression deformities are displaced fractures identified. Sclerosis in the proximal left femur was present on previous study is likely benign. Postoperative changes in the left pelvis.  IMPRESSION: Patchy diffuse infiltrative changes in the lungs may represent edema or pneumonia.  Moderate diffuse free fluid and edema throughout the abdomen, mesenteric, and retroperitoneum. Diffuse colonic wall thickening. Changes  likely due to colitis of nonspecific etiology.   Electronically Signed   By: Lucienne Capers M.D.   On: 06/21/2014 00:43   Ct Chest Wo Contrast  06/21/2014   CLINICAL DATA:  Suspected abdominal sepsis.  EXAM: CT CHEST, ABDOMEN AND PELVIS WITHOUT CONTRAST  TECHNIQUE: Multidetector CT imaging of the chest, abdomen and pelvis was performed following the standard protocol without IV contrast.  COMPARISON:  02/08/2012  FINDINGS: CT CHEST FINDINGS  Endotracheal and enteric tubes are in place. Central venous catheter with tip in the brachiocephalic vein. Normal heart size. Aortic valve and coronary artery calcifications. Calcification of thoracic aorta without aneurysm. Esophagus is decompressed. No significant lymphadenopathy in the chest.  Small bilateral pleural effusions. Diffuse patchy infiltrative process throughout the lungs may represent edema or pneumonia. Scattered emphysematous changes are present. No pneumothorax.  CT ABDOMEN AND PELVIS FINDINGS  There is moderate diffuse free fluid and edema throughout the abdomen and retroperitoneum. Diffuse colonic wall thickening. Changes likely due to colitis. This could indicate pseudomembranous colitis or infectious or inflammatory colitis. No loculated fluid to suggest abscess. The unenhanced appearance of the liver, spleen, pancreas, gallbladder, kidneys, inferior vena cava, and retroperitoneal lymph nodes is grossly unremarkable. Calcification of the abdominal aorta without aneurysm. No free air in the abdomen.  Pelvis: Bladder is decompressed with a Foley catheter. No pelvic mass or lymphadenopathy.  Bones: Degenerative changes in the thoracic and lumbar spine. No vertebral compression deformities are displaced fractures identified. Sclerosis in the proximal left femur was present on previous study is likely benign. Postoperative changes in the left pelvis.  IMPRESSION: Patchy diffuse infiltrative changes in the lungs may represent edema or pneumonia.  Moderate  diffuse free fluid and edema throughout the abdomen, mesenteric, and retroperitoneum. Diffuse colonic wall thickening. Changes likely due to colitis of nonspecific etiology.   Electronically Signed   By: Lucienne Capers M.D.   On: 06/21/2014 00:43   US Renal Port  06/17/2014   CLINICAL DATA:  Elevated creatinine.  EXAM: RENAL/URINARY TRACT ULTRASOUND COMPLETE  COMPARISON:  Ultrasound kidneys 02/09/2012.  CT 02/08/2012.  FINDINGS: Right Kidney:  Length: 11.6 cm. Echogenicity within normal limits. No mass or hydronephrosis visualized.  Left Kidney:  Length: 11.6 cm. Echogenicity within normal limits. No mass or hydronephrosis visualized.  Bladder:  Bladder is decompressed with a Foley catheter in place.  IMPRESSION: Normal ultrasound appearance of the kidneys.  No hydronephrosis.   Electronically Signed   By: Lucienne Capers M.D.   On: 05/28/2014 23:09   Dg Chest Port 1 View  06/21/2014   CLINICAL DATA:  Assess endotracheal and OG tubes  EXAM: PORTABLE CHEST - 1 VIEW  COMPARISON:  06/19/2014  FINDINGS: Endotracheal tube tip measures 1.5 cm above the carinal. Enteric tube tip is off the field of view but below the left hemidiaphragm consistent with location at least in the stomach. Cardiac enlargement. Bilateral perihilar infiltration suggesting edema. Slight blunting of costophrenic angles suggests small pleural effusions. No pneumothorax.  IMPRESSION: Appliances appear to be in satisfactory location. Cardiac enlargement with pulmonary vascular congestion and edema. Probable small pleural effusions.   Electronically Signed  By: Lucienne Capers M.D.   On: 06/21/2014 04:07   Dg Chest Port 1 View  06/14/2014   CLINICAL DATA:  SS endotracheal tube placement ; septic shock ; CHF  EXAM: PORTABLE CHEST - 1 VIEW  COMPARISON:  Portable chest x-ray of June 20, 2014 at 1652 hr  FINDINGS: The patient is rotated on this study. The endotracheal tube tip projects approximately 3.1 cm above the crotch of the carina.  The left internal jugular venous catheter tip lies at the level of the junction of the left internal jugular vein with the left subclavian vein. The cardiopericardial silhouette is mildly enlarged. The left hemidiaphragm is partially obscured. The pulmonary vascularity is engorged, and there is pulmonary interstitial edema. The observed bony structures are unremarkable. There is mild gaseous distention of the stomach unchanged from earlier today.  IMPRESSION: The endotracheal tube tip lies approximately 3.1 cm above the crotch of the carina. There has not been significant interval change in the appearance of the chest since the earlier study.   Electronically Signed   By: David  Martinique   On: 06/15/2014 17:31   Dg Chest Port 1 View  06/16/2014   CLINICAL DATA:  Central line placement.  EXAM: PORTABLE CHEST - 1 VIEW  COMPARISON:  06/09/2014  FINDINGS: The left IJ catheter tip is in the left brachiocephalic vein and should be advanced approximately 6-7 cm. The heart and lungs are unchanged.  IMPRESSION: Left IJ catheter tip is in the left brachiocephalic vein.   Electronically Signed   By: Kalman Jewels M.D.   On: 06/19/2014 17:08   Dg Chest Port 1 View  06/14/2014   CLINICAL DATA:  Shortness of breath, chest pain  EXAM: PORTABLE CHEST - 1 VIEW  COMPARISON:  12/12/2012  FINDINGS: Bilateral interstitial and alveolar airspace opacities. No significant pleural effusion or pneumothorax. No focal consolidation. Stable cardiomediastinal silhouette.  The osseous structures are unremarkable.  IMPRESSION: Findings most concerning for mild pulmonary edema.   Electronically Signed   By: Kathreen Devoid   On: 05/22/2014 12:55   Dg Abd Portable 1v  06/21/2014   CLINICAL DATA:  Assess ET and OG tube.  EXAM: PORTABLE ABDOMEN - 1 VIEW  COMPARISON:  None.  FINDINGS: Enteric tube tip is in the lower abdomen centrally, consistent with location in the distal stomach or proximal duodenum.  IMPRESSION: Enteric tube tip localizes  over the mid abdomen consistent with location in the distal stomach or proximal duodenum.   Electronically Signed   By: Lucienne Capers M.D.   On: 06/21/2014 04:07    Anti-infectives: Anti-infectives   Start     Dose/Rate Route Frequency Ordered Stop   06/22/14 2030  levofloxacin (LEVAQUIN) IVPB 500 mg     500 mg 100 mL/hr over 60 Minutes Intravenous Every 48 hours 05/22/2014 2011     06/22/14 1300  vancomycin (VANCOCIN) IVPB 1000 mg/200 mL premix     1,000 mg 200 mL/hr over 60 Minutes Intravenous Every 48 hours 05/27/2014 1324     06/21/14 2000  fluconazole (DIFLUCAN) IVPB 400 mg  Status:  Discontinued     400 mg 100 mL/hr over 120 Minutes Intravenous Every 24 hours 06/08/2014 1914 06/21/14 0955   06/21/14 2000  fluconazole (DIFLUCAN) IVPB 200 mg  Status:  Discontinued     200 mg 100 mL/hr over 60 Minutes Intravenous Every 24 hours 06/21/14 0955 06/21/14 1015   06/21/14 1245  imipenem-cilastatin (PRIMAXIN) 250 mg in sodium chloride 0.9 % 100 mL IVPB  250 mg 200 mL/hr over 30 Minutes Intravenous Every 12 hours 06/21/14 1230     06/12/2014 2100  piperacillin-tazobactam (ZOSYN) IVPB 3.375 g  Status:  Discontinued     3.375 g 12.5 mL/hr over 240 Minutes Intravenous Every 8 hours 05/23/2014 2051 06/21/14 1230   05/26/2014 2030  levofloxacin (LEVAQUIN) IVPB 750 mg     750 mg 100 mL/hr over 90 Minutes Intravenous  Once 06/05/2014 2011 06/14/2014 2327   06/14/2014 2000  piperacillin-tazobactam (ZOSYN) IVPB 2.25 g  Status:  Discontinued     2.25 g 100 mL/hr over 30 Minutes Intravenous Every 6 hours 05/22/2014 1324 06/02/2014 2051   05/29/2014 1915  fluconazole (DIFLUCAN) IVPB 800 mg     800 mg 200 mL/hr over 120 Minutes Intravenous  Once 06/01/2014 1914 06/11/2014 2218   06/18/2014 1230  piperacillin-tazobactam (ZOSYN) IVPB 3.375 g     3.375 g 100 mL/hr over 30 Minutes Intravenous  Once 06/04/2014 1221 06/08/2014 1305   06/17/2014 1230  vancomycin (VANCOCIN) IVPB 1000 mg/200 mL premix     1,000 mg 200 mL/hr over 60  Minutes Intravenous  Once 05/27/2014 1221 05/25/2014 1418     Abx: vanc, start date 9/29>>>  Abx: zosyn, start date 9/29>>>9/30  Abx: Diflucan Started 9/30>>>10/1  Abx- levaquin started 9/30>>>  Abx: imipenem started 10/1>>  Assessment/Plan Colitis, likely ischemic.  Septic shock Leukocytosis - improved to 48.6 PCM on TPN NSTEMI/demand ischemia Acute on CRF Chronic Fecal Impaction  Neurogenic bladder   Plan: 1.  On levo, on vent, multi-organ injury being managed by CCM.  Clostridium difficile lab pending 2.  This explosive diarrhea may have been a sign of some ongoing low-grade colonic ischemia which worsened with her current illness. She has severe abdominal vascular atherosclerotic disease.  3.  Agree with supportive care and ongoing resuscitation.  4.  If her colon worsens, she may require abdominal colectomy with ileostomy. She is not a good surgical candidate, which has been discussed with her daughter.  Her daughter doesn't think she would want this type of surgery, but the family is divided in a decision at this time.  We will follow closely. 5.  Continue antibiotics 6.  Would not start tube feeds at this time, continue TPN    LOS: 2 days    Coralie Keens 06/22/2014, 7:47 AM Pager: [phone]

## 2014-06-22 NOTE — Progress Notes (Signed)
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   Initial complaints of AMS & low blood sugar.     STUDIES:   9/30 renal- port- Normal Uss appearance of kidneys, bladder decompressed with foley cath in place, no hydronephrosis. 9/30 ct chest - patchy infiltrates 9/30 ct abdo-colitis, free fluid  SIGNIFICANT EVENTS: 9/30  Brought to [facility] by EMS for AMS and hypoglycemia, work up consistent with septic shock   9/30 Intubated >> 10/1- refractory shock 10/1- surgery consulted  SUBJECTIVE: ETT in place, sedated, pressors remain  VITAL SIGNS: Temp:  [98.7 F (37.1 C)-100.4 F (38 C)] 99 F (37.2 C) (10/02 0500) Pulse Rate:  [109-129] 113 (10/02 0703) Resp:  [6-36] 34 (10/02 0703) BP: (64-144)/(45-94) 125/59 mmHg (10/02 0703) SpO2:  [88 %-97 %] 93 % (10/02 0703) Arterial Line BP: (108-179)/(45-80) 138/63 mmHg (10/02 0600) FiO2 (%):  [40 %-60 %] 60 % (10/02 0703)   HEMODYNAMICS: CVP:  [10 mmHg-57 mmHg] 10 mmHg   VENTILATOR SETTINGS: Vent Mode:  [-] PRVC FiO2 (%):  [40 %-60 %] 60 % Set Rate:  [35 bmp] 35 bmp Vt Set:  [380 mL] 380 mL PEEP:  [8 cmH20] 8 cmH20 Plateau Pressure:  [22 cmH20-27 cmH20] 27 cmH20   INTAKE / OUTPUT: Intake/Output Summary (Last 24 hours) at 06/22/14 0715 Last data filed at 06/22/14 0600   Gross per 24 hour   Intake  2801.41 ml   Output    1700 ml   Net  1101.41 ml      PHYSICAL EXAMINATION: General:  Sedated, NAD   Neuro:  rass -2, opens eyes to name HEENT:  NCAT, no JVD Cardiovascular:  s1 s2 Tachy, no m/r/g Lungs:  Reduced, scatter ronchi rt   Abdomen:  Soft, NT, distended mild, no  r/g Musculoskeletal:  S/p R AKA, pulses weak LLE Skin:  Heel ulcer R, no necrotic tissue, red/pink granulation tissue surrounded by min maceration, quarter in size   LABS: Al labs personally reviewed  ASSESSMENT / PLAN:   PULMONARY OETT>>9/30 A: Acute resp failure Metabolic Acidosis uncompensated Int edema, r/o ards Rt base effusion small  P:   ABg reviewed, we have maxed her MV abg follow in am  Peep to 5, goal 50% Keep 8 cc/kg, follow plat pressures keep less 30 Can not wean as MV too high Repeat abg in am Consider neg balance given int prom  CARDIOVASCULAR CVL A:   Septic shock - suspected urinary source, +/- pulmonary component, +/- abdo component CHF (EF 60-65% 01/2012) Hx HTN NSTEMI/demand ischemia ?Cardiogenic Shock component CVP- 12  P:   Cont Levophed to MAP goal 60 Assess CVP trends Hold home amlodipine, HCTZ, Lopressor HCTZ Source control, see ID Echo on order No heparin with drop HGB Asa hold for same   RENAL A:   Acute on Chronic Renal Failure Hyponatremia - likely in setting of volume depletion + chronic HCTZ    Oliguric Metabolic acidosis- Lactic acidosis Hyponatremia Hypomag- 1.2 Hypo Calcemia- ca- 7.7 repleted P:    Monitor BMP in am  Bicarb if k  elevated Likely to require lasix, however, vent needs improving, ensure kvo today  GI/GU A:   Chronic Fecal Impaction Neurogenic bladder R/o abdominal source as seems sicker than urine simple infection alone Also consider Colitis, with elevated WBC- 53.4, CT with findings of bowel thickening - ischemic bowel of concern P:    Manual disimpaction 3x/week NPO- day 3 vent, start TF at trickle 10 cc/hr PPI Surgery consulted, not a good surgical candidate, following lactic repeat decreased (4.9), ldh (482)  HEMATOLOGIC A:   Iron deficiency anemia - pt formerly on iron but stopped after she was told it improved Hgb- 8/6 Leukocytosis Thrombocytopenia (no heparin )  P:   DVT  prophylaxis, SQ heparin for hold for now This is not a picture of HITT Cbc to am  scd to  Left leg  INFECTIOUS A:   Septic shock - suspected urinary source, rule out pulmonary, PNA right R/o abdo ischemia vs infectious colitis Chronic LLE Heel Wound MRSA- PCR+   P:    BCx2 9/29 >>1/2 gram neg rod>>> UC 9/29 >> UA 9/29 >> 100 k >>>  Abx: vanc, start date 9/29>>> Abx: zosyn, start date 9/29>>>9/30 Abx: Diflucan Started 9/30>>>10/1 Abx- levaquin started 9/30>>> Abx: imipenem started 10/1>>> Once gram neg rod ID, narrow coverage Repeat BC today Await echo for veg  ENDOCRINE A:   Hypothyroid   DM II R/o rel AI P:    Continue levothyroxine SSI Insulin on hold-hypogycemia TSH wnl Cortisol acaudate, dc roids Add ssi with Tf start  NEUROLOGIC A:    Chronic pain Hx paraplegia s/p spinal fx Insomnia   P:    RASS goal: -2 with WUA Hold home baclofen, gabapentin, oxycodone   Avoiding benzo  Family bedside updated , daughter and husband 9/30   Interdisciplinary Family Meeting v Palliative Care Meeting: n/a  TODAY'S SUMMARY: gram neg rod , await IDE, No weaning, start trickle Tf attempt  I have personally obtained a history, examined the patient, evaluated laboratory and imaging results, formulated the assessment and plan and placed orders.  CRITICAL CARE: The patient is critically ill with multiple organ systems failure and requires high complexity decision making for assessment and support, frequent evaluation and titration of therapies, application of advanced monitoring technologies and extensive interpretation of multiple databases. Critical Care Time devoted to patient care services described in this note is 30 minutes.   Lavon Paganini. Titus Mould, MD, [place] Pgr: [facility]

## 2014-06-22 NOTE — Significant Event (Signed)
I was called to the bedside by the patient's family. With representation by the patient's husband, son, and daughter, I was asked to reverse any prior code limitations and make her a FULL CODE.  They specifically asked me to "do everything you can to keep her alive."  They believe that she has communicated this to them today.  She now has a "FULL CODE" order in the chart.  Discussed with MD in [facility] as well.

## 2014-06-22 NOTE — Progress Notes (Signed)
Abdomen soft. Not appreciably tender on my exam. Levophed down a bit.  Patient examined and I agree with the assessment and plan  Georganna Skeans, MD, MPH, FACS Trauma: [phone] General Surgery: [phone]  06/22/2014 10:37 AM

## 2014-06-22 NOTE — Care Management Note (Addendum)
    Page 1 of 1   06/26/2014     10:28:09 AM CARE MANAGEMENT NOTE 06/26/2014  Patient:  Alexandra Wiley, Alexandra Wiley   Account Number:  [id number]  Date Initiated:  06/22/2014  Documentation initiated by:  Elissa Hefty  Subjective/Objective Assessment:   adm w septic shock-vent     Action/Plan:   lives w fam, pt is paraplegic, act w adv homecare   Anticipated DC Date:     Anticipated DC Plan:  [place]         [facility] Choice  Resumption Of Svcs/PTA Provider   Choice offered to / List presented to:          [facility] arranged  HH-1 RN  [place].   Status of service:   Medicare Important Message given?  YES (If response is "NO", the following Medicare IM given date fields will be blank) Date Medicare IM given:  06/26/2014 Medicare IM given by:  Elissa Hefty Date Additional Medicare IM given:   Additional Medicare IM given by:    Discharge Disposition:  [place]  Per UR Regulation:  Reviewed for med. necessity/level of care/duration of stay  If discussed at [place] of Stay Meetings, dates discussed:    Comments:

## 2014-06-22 NOTE — Progress Notes (Signed)
NUTRITION FOLLOW-UP/CONSULT  INTERVENTION:  Initiate TF via OGT with Vital High Protein at 10 ml/hr per MD order    Once ready to increase TF recommend increase to goal rate of 55 ml/h (1320 ml per day) to provide 1320 kcals (67% of estimated needs), 116 gm protein, 1104 ml free water daily.  NUTRITION DIAGNOSIS: Inadequate oral intake related to inability to eat as evidenced by NPO status; ongoing.   Goal: Enteral nutrition to provide 60-70% of estimated calorie needs (22-25 kcals/kg ideal body weight) and 100% of estimated protein needs, based on ASPEN guidelines for permissive underfeeding in critically ill obese individuals; not met.   Monitor:  Ability to tolerate TF, weight trend, labs, vent status.  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 Initial complaints of AMS & low blood sugar.   CT scan 9/30 demonstrated diffuse thickening of the colon consistent with colitis. Surgery Team following. MD ordered RD consult this am. MD started Vital High Protein @ 10 ml/h with orders not to advance for now.   Patient is currently intubated on ventilator support MV: 12.8 L/min Temp (24hrs), Avg:99.5 F (37.5 C), Min:98.1 F (36.7 C), Max:100.4 F (38 C)   Height: Ht Readings from Last 1 Encounters:  06/19/2014 4' 11.06" (1.5 m)    Weight: Wt Readings from Last 1 Encounters:  06/21/14 171 lb 1.2 oz (77.6 kg)    Ideal Body Weight: 42 kg (adjusted for AKA)  BMI:  36.7  Estimated Nutritional Needs: Kcal: 1962 Protein: 84-105 gm protein Fluid: 1.8 L  Skin: stage 2 pressure ulcer to buttocks, diabetic ulcer to left heel   Diet Order: NPO   Intake/Output Summary (Last 24 hours) at 06/22/14 1415 Last data filed at 06/22/14 1200  Gross per 24 hour  Intake 2370.13 ml  Output   1725 ml  Net 645.13 ml    Last BM: 9/30   Labs:   Recent  Labs Lab 06/09/2014 1354  06/21/14 0118 06/21/14 0415 06/21/14 1130 06/22/14 0500 06/22/14 0925  NA 130*  < > 129*  --  124* 126*  --   K 4.6  < > 3.8  --  3.6* 4.2  --   CL 101  < > 97  --  89* 93*  --   CO2 12*  < > 12*  --  19 21  --   BUN 21  < > 21  --  23 26*  --   CREATININE 2.20*  < > 2.24*  --  2.29* 2.47*  --   CALCIUM 7.0*  < > 6.8*  --  7.1* 7.7*  --   MG 0.9*  --   --  1.2*  --   --  2.1  PHOS 3.3  --   --  3.6  --   --   --   GLUCOSE 115*  < > 416*  --  338* 135*  --   < > = values in this interval not displayed.  CBG (last 3)   Recent Labs  06/22/14 0502 06/22/14 0736 06/22/14 1226  GLUCAP 100* 117* 107*    Scheduled Meds: . antiseptic oral rinse  7 mL Mouth Rinse QID  . chlorhexidine  15 mL Mouth Rinse BID  . Chlorhexidine Gluconate Cloth  6 each Topical Q0600  . feeding supplement (VITAL HIGH PROTEIN)  1,000 mL Per Tube Q24H  .  fentaNYL  50 mcg Intravenous Once  . imipenem-cilastatin  250 mg Intravenous Q12H  . [START ON 06/23/2014] Influenza vac split quadrivalent PF  0.5 mL Intramuscular Tomorrow-1000  . insulin aspart  0-9 Units Subcutaneous 6 times per day  . levofloxacin (LEVAQUIN) IV  500 mg Intravenous Q48H  . levothyroxine  75 mcg Oral QAC breakfast  . mupirocin ointment  1 application Nasal BID  . pantoprazole sodium  40 mg Per Tube Daily  . vancomycin  1,000 mg Intravenous Q48H    Continuous Infusions: . fentaNYL infusion INTRAVENOUS 100 mcg/hr (06/22/14 1257)  . norepinephrine (LEVOPHED) Adult infusion 12 mcg/min (06/22/14 1210)   [place], [place], [place] Pager [phone] After Hours Pager

## 2014-06-22 NOTE — H&P (Deleted)
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   Initial complaints of AMS & low blood sugar.    STUDIES:  9/30 renal- port- Normal Uss appearance of kidneys, bladder decompressed with foley cath in place, no hydronephrosis. 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  Pt has a permanent indwelling cath, changed once a month, also has fecal impaction requiring manual disimpaction- 3x/wk by HHN.    Upon arrival, pt was noted to be tachycardic and hypotensive.  Lab work notable for metabolic acidosis, AG 22, Na 122, Cr 2.50 (baseline ~2), lactic acid 5.85, WBC 35.6, Hgb 7.7 (baseline ~ 9) and CXR with bilateral lower haziness concerning for possible effusions vs infiltrate.  Patient had one episode of soft stool on arrival. Pt was treated with broad spectrum antibiotics & volume resuscitated but remained hypotensive.  Pt has been was initially on Norepin, vasopressin, phenylephrine and epinephrine, to maintain  MAP.  Concerns for sepsis with possible urinary source, PCCM called for ICU admission.   SUBJECTIVE: ETT in place, sedated, on Norepin pressor.  VITAL SIGNS: Temp:  [98.7 F (37.1 C)-100.4 F (38 C)] 100.4 F (38 C) (10/02 0741) Pulse Rate:  [109-129] 113 (10/02 0703) Resp:  [6-36] 34 (10/02 0703) BP: (64-144)/(45-94) 125/59 mmHg (10/02 0703) SpO2:  [88 %-96 %] 93 % (10/02 0703) Arterial Line BP: (108-179)/(45-80) 138/63 mmHg (10/02 0600) FiO2 (%):  [40 %-60 %] 60 % (10/02 0703)  HEMODYNAMICS: CVP:  [10 mmHg-57 mmHg] 10 mmHg  VENTILATOR SETTINGS: Vent Mode:  [-] PRVC FiO2 (%):  [40 %-60 %] 60 % Set Rate:  [35 bmp] 35 bmp Vt Set:  [380 mL] 380 mL PEEP:  [8 cmH20] 8 cmH20 Plateau Pressure:  [22 PJA25-05 cmH20] 27 cmH20  INTAKE / OUTPUT:  Intake/Output Summary (Last 24 hours) at 06/22/14 0802 Last data filed at 06/22/14 0600  Gross per 24 hour  Intake 2415.31 ml  Output   1625 ml  Net 790.31 ml    PHYSICAL EXAMINATION: General:  Sedated  Neuro:  rass -2, not following commands, opens eyes to voice HEENT:  NCAT, some JVD Cardiovascular:  s1 s2 Tachy, no m/r/g Lungs:  Reduced, scatter ronchi rt  Abdomen:  Soft, NT, distended mild, no r/g Musculoskeletal:  S/p R AKA, pulses present Skin:  Heel ulcer R, no necrotic tissue, red/pink granulation tissue surrounded by min maceration, quarter in size   LABS:  CBC  Recent Labs Lab 06/21/14 1130 06/21/14 1840 06/22/14 0500  WBC 48.1* 48.7* 48.6*  HGB 8.9* 9.0* 8.6*  HCT 26.6* 26.9* 25.3*  PLT 173 154 121*   Coag's  Recent Labs Lab 06/04/2014 1354  APTT 49*  INR 1.75*    BMET  Recent Labs Lab 06/21/14 0118 06/21/14 1130 06/22/14 0500  NA 129* 124* 126*  K 3.8 3.6* 4.2  CL 97 89* 93*  CO2 12* 19 21  BUN 21 23 26*  CREATININE 2.24* 2.29* 2.47*  GLUCOSE 416* 338* 135*   Electrolytes  Recent Labs Lab 06/02/2014 1354  06/21/14 0118 06/21/14 0415 06/21/14 1130 06/22/14 0500  CALCIUM 7.0*  < > 6.8*   --  7.1* 7.7*  MG 0.9*  --   --  1.2*  --   --   PHOS 3.3  --   --  3.6  --   --   < > = values in this interval not displayed. Sepsis Markers  Recent Labs Lab 06/19/2014 2105 06/16/2014 2130 06/06/2014 2330 06/21/14 1130  LATICACIDVEN 7.5*  --  9.3* 4.9*  PROCALCITON  --  34.48  --   --    ABG  Recent Labs Lab 06/21/14 1249 06/22/14 0242 06/22/14 0723  PHART 7.230* 7.275* 7.270*  PCO2ART 44.8 44.7 47.5*  PO2ART 69.0* 69.4* 99.0    Liver Enzymes  Recent Labs Lab 05/31/2014 1220 06/21/14 1130 06/22/14 0500  AST 41* 103* 99*  ALT 19 27 28   ALKPHOS 101 75 74  BILITOT 0.9 1.5* 1.4*  ALBUMIN 2.9* 2.0* 1.9*   Cardiac Enzymes  Recent Labs Lab 06/18/2014 1354 06/19/2014 2130 06/21/14 0130  TROPONINI 7.79* 10.10* 13.90*    Glucose  Recent Labs Lab 06/21/14 1900 06/21/14 1924 06/21/14 1949 06/21/14 2133 06/22/14 0020 06/22/14 0502  GLUCAP 63* 144* 140* 113* 104* 100*    Imaging Ct Abdomen Pelvis Wo Contrast  06/21/2014   CLINICAL DATA:  Suspected abdominal sepsis.  EXAM: CT CHEST, ABDOMEN AND PELVIS WITHOUT CONTRAST  TECHNIQUE: Multidetector CT imaging of the chest, abdomen and pelvis was performed following the standard protocol without IV contrast.  COMPARISON:  02/08/2012  FINDINGS: CT CHEST FINDINGS  Endotracheal and enteric tubes are in place. Central venous catheter with tip in the brachiocephalic vein. Normal heart size. Aortic valve and coronary artery calcifications. Calcification of thoracic aorta without aneurysm. Esophagus is decompressed. No significant lymphadenopathy in the chest.  Small bilateral pleural effusions. Diffuse patchy infiltrative process throughout the lungs may represent edema or pneumonia. Scattered emphysematous changes are present. No pneumothorax.  CT ABDOMEN AND PELVIS FINDINGS  There is moderate diffuse free fluid and edema throughout the abdomen and retroperitoneum. Diffuse colonic wall thickening. Changes likely due to colitis. This  could indicate pseudomembranous colitis or infectious or inflammatory colitis. No loculated fluid to suggest abscess. The unenhanced appearance of the liver, spleen, pancreas, gallbladder, kidneys, inferior vena cava, and retroperitoneal lymph nodes is grossly unremarkable. Calcification of the abdominal aorta without aneurysm. No free air in the abdomen.  Pelvis: Bladder is decompressed with a Foley catheter. No pelvic mass or lymphadenopathy.  Bones: Degenerative changes in the thoracic and lumbar spine. No vertebral compression deformities are displaced fractures identified. Sclerosis in the proximal left femur was present on previous study is likely benign. Postoperative changes in the left pelvis.  IMPRESSION: Patchy diffuse infiltrative changes in the lungs may represent edema or pneumonia.  Moderate diffuse free fluid and edema throughout the abdomen, mesenteric, and retroperitoneum. Diffuse colonic wall thickening. Changes likely due to colitis of nonspecific etiology.   Electronically Signed   By: Gwyndolyn Saxon  Gerilyn Nestle M.D.   On: 06/21/2014 00:43   Ct Chest Wo Contrast  06/21/2014   CLINICAL DATA:  Suspected abdominal sepsis.  EXAM: CT CHEST, ABDOMEN AND PELVIS WITHOUT CONTRAST  TECHNIQUE: Multidetector CT imaging of the chest, abdomen and pelvis was performed following the standard protocol without IV contrast.  COMPARISON:  02/08/2012  FINDINGS: CT CHEST FINDINGS  Endotracheal and enteric tubes are in place. Central venous catheter with tip in the brachiocephalic vein. Normal heart size. Aortic valve and coronary artery calcifications. Calcification of thoracic aorta without aneurysm. Esophagus is decompressed. No significant lymphadenopathy in the chest.  Small bilateral pleural effusions. Diffuse patchy infiltrative process throughout the lungs may represent edema or pneumonia. Scattered emphysematous changes are present. No pneumothorax.  CT ABDOMEN AND PELVIS FINDINGS  There is moderate diffuse free  fluid and edema throughout the abdomen and retroperitoneum. Diffuse colonic wall thickening. Changes likely due to colitis. This could indicate pseudomembranous colitis or infectious or inflammatory colitis. No loculated fluid to suggest abscess. The unenhanced appearance of the liver, spleen, pancreas, gallbladder, kidneys, inferior vena cava, and retroperitoneal lymph nodes is grossly unremarkable. Calcification of the abdominal aorta without aneurysm. No free air in the abdomen.  Pelvis: Bladder is decompressed with a Foley catheter. No pelvic mass or lymphadenopathy.  Bones: Degenerative changes in the thoracic and lumbar spine. No vertebral compression deformities are displaced fractures identified. Sclerosis in the proximal left femur was present on previous study is likely benign. Postoperative changes in the left pelvis.  IMPRESSION: Patchy diffuse infiltrative changes in the lungs may represent edema or pneumonia.  Moderate diffuse free fluid and edema throughout the abdomen, mesenteric, and retroperitoneum. Diffuse colonic wall thickening. Changes likely due to colitis of nonspecific etiology.   Electronically Signed   By: Lucienne Capers M.D.   On: 06/21/2014 00:43   Dg Chest Port 1 View  06/21/2014   CLINICAL DATA:  Assess endotracheal and OG tubes  EXAM: PORTABLE CHEST - 1 VIEW  COMPARISON:  06/19/2014  FINDINGS: Endotracheal tube tip measures 1.5 cm above the carinal. Enteric tube tip is off the field of view but below the left hemidiaphragm consistent with location at least in the stomach. Cardiac enlargement. Bilateral perihilar infiltration suggesting edema. Slight blunting of costophrenic angles suggests small pleural effusions. No pneumothorax.  IMPRESSION: Appliances appear to be in satisfactory location. Cardiac enlargement with pulmonary vascular congestion and edema. Probable small pleural effusions.   Electronically Signed   By: Lucienne Capers M.D.   On: 06/21/2014 04:07   Dg Abd  Portable 1v  06/21/2014   CLINICAL DATA:  Assess ET and OG tube.  EXAM: PORTABLE ABDOMEN - 1 VIEW  COMPARISON:  None.  FINDINGS: Enteric tube tip is in the lower abdomen centrally, consistent with location in the distal stomach or proximal duodenum.  IMPRESSION: Enteric tube tip localizes over the mid abdomen consistent with location in the distal stomach or proximal duodenum.   Electronically Signed   By: Lucienne Capers M.D.   On: 06/21/2014 04:07     ASSESSMENT / PLAN:  PULMONARY ETT   A: Dyspnea / Tacyhpnea  - Pulmonary congestion on chest xray today Tobacco Abuse Acute resp failure - On Vent  Metabolic Acidosis- resolving, not compensated, ABG this Am- PH- 7.27, high Po2- 99, bicarb- 21, Anion gap has closed- 12, Fio2- 60%, rate- 35, TV- 8cc/kg. Pco2- 47.5, Sats still not correlating.   P:  Retract ett 1 cm Consider reducing Rate from 35 to 30, Fi02 to 50%.  pcx rin am for development of bilat infiltrates Empiric abx, see ID  CARDIOVASCULAR CVL A:  Septic shock - Abdominal source  CHF (EF 60-65% 01/2012), Echo Pending- Hx HTN NSTEMI/demand ischemia ?Cardiogenic Shock component CVP- 16 Tachycardic  P:  Wean off Levophed, MAP goal 65 Assess CVP trends Hold home amlodipine, HCTZ, Lopressor HCTZ Source control, see ID Echo on order No heparin with drop HGB Asa hold for same reason  RENAL A:  Acute on Chronic Renal Failure Hyponatremia - likely in setting of volume depletion + chronic HCTZ   Metabolic acidosis- Lactic acidosis- 4.9 today Hyponatremia- Stable Hypomag- 1.2, s/p repletion,from initial 0.9 Hypo Calcemia- ca- 6.8, corrected- 9.4.  P:   Monitor BMP Ensure renal perfusion with MAP >65 Bicarb if k elevated co2 on chem has not improved bmet q12H Mag today-ordered  GI/GU A:  Chronic Fecal Impaction Neurogenic bladder Ischemic bowel- bowel thickening, hx of PVD, but LDH not impressive.  Also consider Colitis, with elevated WBC- 53.4 LFTs  WNL.  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  P:   BCx2 9/29 >> UC 9/29 >> UA 9/29 >>   Abx: vanc, start date 9/29>>> Abx- zosyn, start date 9/29>>>06/21/2014 Abx: Diflucan Started 9/30>>>10/1 Abx- levaquin started 9/30>>> Abx- Imipenem started 06/21/2014  Consider surgical consult  ENDOCRINE A:  Hypothyroid  DM II R/o rel AI P:   Continue levothyroxine SSI TSH wnl Cortisol   NEUROLOGIC A:   Chronic pain Hx paraplegia s/p spinal fx Insomnia  P:   RASS goal: -2 Hold home baclofen, gabapentin, oxycodone   Family bedside updated , duaghter and husband  Interdisciplinary Family Meeting v Palliative Care Meeting: n/a   TODAY'S SUMMARY: improved BP, surgery to see colitis? Ishcmia? Wean off levofed?   I have personally obtained a history, examined the patient, evaluated laboratory and imaging results, formulated the assessment and plan and placed orders.  CRITICAL CARE: The patient is critically ill with multiple organ systems failure and requires high complexity decision making for assessment and support, frequent evaluation and titration of therapies, application of advanced monitoring technologies and extensive interpretation of multiple databases. Critical Care Time devoted to patient care services described in this note is 55 minutes.    Bing Neighbors, MD PGY-2 IMTS 06/22/2014, 8:02 AM  I have fully examined this patient and agree with above findings.     And edited infull  Lavon Paganini. Titus Mould, MD, [place] Pgr: [facility]

## 2014-06-22 NOTE — Progress Notes (Signed)
Echocardiogram 2D Echocardiogram with Definity has been performed.  Alexandra Wiley 06/22/2014, 2:44 PM

## 2014-06-23 ENCOUNTER — Inpatient Hospital Stay (HOSPITAL_COMMUNITY): Payer: Medicare Other

## 2014-06-23 LAB — BASIC METABOLIC PANEL WITH GFR
Anion gap: 12 (ref 5–15)
BUN: 29 mg/dL — ABNORMAL HIGH (ref 6–23)
CO2: 22 meq/L (ref 19–32)
Calcium: 8 mg/dL — ABNORMAL LOW (ref 8.4–10.5)
Chloride: 100 meq/L (ref 96–112)
Creatinine, Ser: 2.4 mg/dL — ABNORMAL HIGH (ref 0.50–1.10)
GFR calc Af Amer: 22 mL/min — ABNORMAL LOW
GFR calc non Af Amer: 19 mL/min — ABNORMAL LOW
Glucose, Bld: 105 mg/dL — ABNORMAL HIGH (ref 70–99)
Potassium: 4.1 meq/L (ref 3.7–5.3)
Sodium: 134 meq/L — ABNORMAL LOW (ref 137–147)

## 2014-06-23 LAB — CBC WITH DIFFERENTIAL/PLATELET
Basophils Absolute: 0 K/uL (ref 0.0–0.1)
Basophils Relative: 0 % (ref 0–1)
Eosinophils Absolute: 0 K/uL (ref 0.0–0.7)
Eosinophils Relative: 0 % (ref 0–5)
HCT: 22.4 % — ABNORMAL LOW (ref 36.0–46.0)
Hemoglobin: 7.6 g/dL — ABNORMAL LOW (ref 12.0–15.0)
Lymphocytes Relative: 3 % — ABNORMAL LOW (ref 12–46)
Lymphs Abs: 0.8 K/uL (ref 0.7–4.0)
MCH: 22.5 pg — ABNORMAL LOW (ref 26.0–34.0)
MCHC: 33.9 g/dL (ref 30.0–36.0)
MCV: 66.3 fL — ABNORMAL LOW (ref 78.0–100.0)
Monocytes Absolute: 2.7 K/uL — ABNORMAL HIGH (ref 0.1–1.0)
Monocytes Relative: 10 % (ref 3–12)
Neutro Abs: 23.9 K/uL — ABNORMAL HIGH (ref 1.7–7.7)
Neutrophils Relative %: 87 % — ABNORMAL HIGH (ref 43–77)
Platelets: 87 K/uL — ABNORMAL LOW (ref 150–400)
RBC: 3.38 MIL/uL — ABNORMAL LOW (ref 3.87–5.11)
RDW: 20.2 % — ABNORMAL HIGH (ref 11.5–15.5)
WBC Morphology: INCREASED
WBC: 27.4 K/uL — ABNORMAL HIGH (ref 4.0–10.5)

## 2014-06-23 LAB — BLOOD GAS, ARTERIAL
Acid-base deficit: 2.5 mmol/L — ABNORMAL HIGH (ref 0.0–2.0)
Bicarbonate: 22.3 meq/L (ref 20.0–24.0)
Drawn by: 24513
FIO2: 0.6 %
MECHVT: 380 mL
O2 Saturation: 90.2 %
PEEP: 5 cmH2O
Patient temperature: 97.7
RATE: 35 {breaths}/min
TCO2: 23.6 mmol/L (ref 0–100)
pCO2 arterial: 41.2 mmHg (ref 35.0–45.0)
pH, Arterial: 7.35 (ref 7.350–7.450)
pO2, Arterial: 60.6 mmHg — ABNORMAL LOW (ref 80.0–100.0)

## 2014-06-23 LAB — CULTURE, BLOOD (ROUTINE X 2)

## 2014-06-23 LAB — GLUCOSE, CAPILLARY
GLUCOSE-CAPILLARY: 111 mg/dL — AB (ref 70–99)
GLUCOSE-CAPILLARY: 104 mg/dL — AB (ref 70–99)
Glucose-Capillary: 104 mg/dL — ABNORMAL HIGH (ref 70–99)
Glucose-Capillary: 104 mg/dL — ABNORMAL HIGH (ref 70–99)
Glucose-Capillary: 104 mg/dL — ABNORMAL HIGH (ref 70–99)
Glucose-Capillary: 131 mg/dL — ABNORMAL HIGH (ref 70–99)

## 2014-06-23 LAB — LACTIC ACID, PLASMA: Lactic Acid, Venous: 1.3 mmol/L (ref 0.5–2.2)

## 2014-06-23 MED ORDER — PIPERACILLIN-TAZOBACTAM 3.375 G IVPB
3.3750 g | Freq: Three times a day (TID) | INTRAVENOUS | Status: DC
Start: 2014-06-23 — End: 2014-06-27
  Administered 2014-06-23 – 2014-06-27 (×11): 3.375 g via INTRAVENOUS
  Filled 2014-06-23 (×14): qty 50

## 2014-06-23 MED ORDER — VANCOMYCIN HCL IN DEXTROSE 1-5 GM/200ML-% IV SOLN
1000.0000 mg | INTRAVENOUS | Status: DC
  Administered 2014-06-24: 1000 mg via INTRAVENOUS
  Filled 2014-06-23 (×2): qty 200

## 2014-06-23 MED ORDER — SODIUM CHLORIDE 0.9 % IV SOLN
1.0000 g | Freq: Once | INTRAVENOUS | Status: AC
  Administered 2014-06-23: 1 g via INTRAVENOUS
  Filled 2014-06-23: qty 10

## 2014-06-23 MED ORDER — VITAL HIGH PROTEIN PO LIQD: 1000.0000 mL | ORAL | Status: DC

## 2014-06-23 NOTE — Progress Notes (Signed)
Subjective: Intubated. Sedated. On vent. Arousable. Denies abdominal pain  Objective: Vital signs in last 24 hours: Temp:  [97.7 F (36.5 C)-98.9 F (37.2 C)] 98.7 F (37.1 C) (10/03 0735) Pulse Rate:  [97-127] 115 (10/03 0800) Resp:  [16-36] 35 (10/03 0800) BP: (116-130)/(53-58) 116/53 mmHg (10/03 0310) SpO2:  [87 %-98 %] 96 % (10/03 0800) Arterial Line BP: (82-154)/(43-63) 113/53 mmHg (10/03 0800) FiO2 (%):  [50 %-90 %] 80 % (10/03 0800) Weight:  [197 lb 5 oz (89.5 kg)-201 lb 15.1 oz (91.6 kg)] 197 lb 5 oz (89.5 kg) (10/03 0500) Last BM Date: 06/04/2014  Intake/Output from previous day: 10/02 0701 - 10/03 0700 In: 1371.9 [I.V.:641; NG/GT:230.8; IV Piggyback:500] Out: 2952 [Urine:1675] Intake/Output this shift: Total I/O In: 86.7 [I.V.:16.7; NG/GT:70] Out: 100 [Urine:100]  Resp: clear to auscultation bilaterally and on vent Cardio: regular rate and rhythm GI: soft, minimal tenderness  Lab Results:   Recent Labs  06/22/14 1700 06/23/14 0400  WBC 36.1* 27.4*  HGB 8.1* 7.6*  HCT 23.1* 22.4*  PLT 106* 87*   BMET  Recent Labs  06/22/14 0500 06/23/14 0400  NA 126* 134*  K 4.2 4.1  CL 93* 100  CO2 21 22  GLUCOSE 135* 105*  BUN 26* 29*  CREATININE 2.47* 2.40*  CALCIUM 7.7* 8.0*   PT/INR  Recent Labs  05/29/2014 1354  LABPROT 20.4*  INR 1.75*   ABG  Recent Labs  06/22/14 1720 06/23/14 0355  PHART 7.315* 7.350  HCO3 20.6 22.3    Studies/Results: Dg Chest Port 1 View  06/23/2014   CLINICAL DATA:  Pneumonia, subsequent encounter  EXAM: PORTABLE CHEST - 1 VIEW  COMPARISON:  06/22/2014; 06/21/2014; 05/29/2014; chest CT - 06/21/2014  FINDINGS: Grossly unchanged enlarged cardiac silhouette and mediastinal contours with atherosclerotic plaque within the thoracic aorta. Stable positioning of support apparatus. Pulmonary vasculature appears less distinct on the present examination. Worsening bilateral mid lung heterogeneous interstitial opacities  superimposed on extensive bilateral coarsened reticular opacities. Bilateral effusions are unchanged. Unchanged bones.  IMPRESSION: 1.  Stable positioning of support apparatus.  No pneumothorax. 2. Worsening bilateral mid lung opacities superimposed on pre-existing extensive bilateral coarse reticular opacities worrisome for progression of multifocal infection/ARDS, though note, underlying edema is not excluded. Continued attention on follow-up is recommended.   Electronically Signed   By: Sandi Mariscal M.D.   On: 06/23/2014 07:44   Dg Chest Port 1 View  06/22/2014   CLINICAL DATA:  Septic shock, check endotracheal tube placement  EXAM: PORTABLE CHEST - 1 VIEW  COMPARISON:  06/21/2014  FINDINGS: The endotracheal tube is again identified 3.3 cm above the carina. A nasogastric catheter is seen within the stomach and unchanged. A left central venous line is again noted in the mid left innominate vein.  The cardiac shadow is stable in appearance. Diffuse bilateral opacities are identified consistent with pulmonary edema. The overall appearance is slightly increased when compared with the prior exam. No bony abnormality is noted.  IMPRESSION: Slight increase in the degree of pulmonary edema.  Tubes and lines as described above.   Electronically Signed   By: Inez Catalina M.D.   On: 06/22/2014 07:54    Anti-infectives: Anti-infectives   Start     Dose/Rate Route Frequency Ordered Stop   06/22/14 2030  levofloxacin (LEVAQUIN) IVPB 500 mg     500 mg 100 mL/hr over 60 Minutes Intravenous Every 48 hours 06/12/2014 2011     06/22/14 1300  vancomycin (VANCOCIN) IVPB 1000 mg/200 mL premix  1,000 mg 200 mL/hr over 60 Minutes Intravenous Every 48 hours 06/04/2014 1324     06/21/14 2000  fluconazole (DIFLUCAN) IVPB 400 mg  Status:  Discontinued     400 mg 100 mL/hr over 120 Minutes Intravenous Every 24 hours 06/05/2014 1914 06/21/14 0955   06/21/14 2000  fluconazole (DIFLUCAN) IVPB 200 mg  Status:  Discontinued     200  mg 100 mL/hr over 60 Minutes Intravenous Every 24 hours 06/21/14 0955 06/21/14 1015   06/21/14 1245  imipenem-cilastatin (PRIMAXIN) 250 mg in sodium chloride 0.9 % 100 mL IVPB     250 mg 200 mL/hr over 30 Minutes Intravenous Every 12 hours 06/21/14 1230     06/19/2014 2100  piperacillin-tazobactam (ZOSYN) IVPB 3.375 g  Status:  Discontinued     3.375 g 12.5 mL/hr over 240 Minutes Intravenous Every 8 hours 05/28/2014 2051 06/21/14 1230   05/28/2014 2030  levofloxacin (LEVAQUIN) IVPB 750 mg     750 mg 100 mL/hr over 90 Minutes Intravenous  Once 06/04/2014 2011 05/23/2014 2327   05/25/2014 2000  piperacillin-tazobactam (ZOSYN) IVPB 2.25 g  Status:  Discontinued     2.25 g 100 mL/hr over 30 Minutes Intravenous Every 6 hours 06/14/2014 1324 05/30/2014 2051   06/09/2014 1915  fluconazole (DIFLUCAN) IVPB 800 mg     800 mg 200 mL/hr over 120 Minutes Intravenous  Once 05/28/2014 1914 05/26/2014 2218   06/17/2014 1230  piperacillin-tazobactam (ZOSYN) IVPB 3.375 g     3.375 g 100 mL/hr over 30 Minutes Intravenous  Once 05/25/2014 1221 05/23/2014 1305   06/04/2014 1230  vancomycin (VANCOCIN) IVPB 1000 mg/200 mL premix     1,000 mg 200 mL/hr over 60 Minutes Intravenous  Once 06/11/2014 1221 06/05/2014 1418      Assessment/Plan: s/p * No surgery found * Continue antibiotics and bowel rest. She is definitely making improvement. Safest course would be nonoperative management. Poor surgical candidate Will follow  LOS: 3 days    TOTH III,Taran Hable S 06/23/2014

## 2014-06-23 NOTE — Progress Notes (Addendum)
[facility] Progress Note Patient Name: Alexandra Wiley DOB: 09-Jul-1946 MRN: [id number]   Date of Service  06/23/2014  HPI/Events of Note  E. Coli in blood, sens But need coverage for HCAP Imi in short supply  eICU Interventions  Zosyn/ vanc per dr Halford Chessman dc levaquin     Intervention Category Intermediate Interventions: Diagnostic test evaluation  ALVA,RAKESH V. 06/23/2014, 4:16 PM

## 2014-06-23 NOTE — Progress Notes (Signed)
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   Initial complaints of AMS & low blood sugar.     STUDIES:   9/30 renal- port- Normal Uss appearance of kidneys, bladder decompressed with foley cath in place, no hydronephrosis. 9/30 ct chest - patchy infiltrates 9/30 ct abdo-colitis, free fluid 10/2 2 d: Echo ef 02%, grade 1 diastolic dysfunction.  SIGNIFICANT EVENTS: 9/30  Brought to [facility] by EMS for AMS and hypoglycemia, work up consistent with septic shock   9/30 Intubated >> 10/1- refractory shock 10/1- surgery consulted 10/3 full code  SUBJECTIVE: ETT in place, sedated, on pressors, arouses to voice. Now full code.  VITAL SIGNS: BP 116/53  Pulse 115  Temp(Src) 98.7 F (37.1 C) (Oral)  Resp 35  Ht 4' 11.06" (1.5 m)  Wt 197 lb 5 oz (89.5 kg)  BMI 39.78 kg/m2  SpO2 96%    HEMODYNAMICS: .cvp 11   Vent Mode:  [-] PRVC FiO2 (%):  [50 %-90 %] 80 % Set Rate:  [35 bmp] 35 bmp Vt Set:  [380 mL] 380 mL PEEP:  [5 cmH20-8 cmH20] 5 cmH20 Plateau Pressure:  [16 cmH20-27 cmH20] 22 cmH20   INTAKE / OUTPUT:  Intake/Output Summary (Last 24 hours) at 06/23/14 0845 Last data filed at 06/23/14 0800  Gross per 24 hour  Intake 1286.2 ml  Output   1550 ml  Net -263.8 ml       PHYSICAL EXAMINATION: General:  Sedated, NAD   Neuro:  rass -2, opens eyes to name HEENT:  NCAT, no JVD at 30%  Cardiovascular:  s1 s2 Tachy, no m/r/g Lungs:  Reduced, scatter ronchi rt  , diminished in bases Abdomen:  Soft, NT, distended mild, no r/g. Will start TF 10/3 Musculoskeletal:  S/p R AKA, pulses weak LLE Skin:  Heel ulcer L, no  necrotic tissue, red/pink granulation tissue surrounded by min maceration, quarter in size . Rt AKA noted  LABS: Al labs personally reviewed  ASSESSMENT / PLAN:   PULMONARY OETT>>9/30>> A: Acute resp failure Metabolic Acidosis  Int edema, r/o ards Rt base effusion small  P:   ABg reviewed, we have maxed her MV abg follow in am  Peep to 5, goal 50%(currently90% fio2) Keep 8 cc/kg, follow plat pressures keep less 30(pip 20/plt 17) Can not wean as MV too high Repeat abg in am Consider neg balance given int prom, when pressors needs are decreased  CARDIOVASCULAR CVL 9/30 [facility]>> A:   Septic shock - suspected urinary source, +/- pulmonary component, +/- abdo component CHF (EF 60-65% 01/2012) Hx HTN NSTEMI/demand ischemia ?Cardiogenic Shock component CVP- 11  P:   Cont Levophed to MAP goal 60 Assess CVP trends Hold home amlodipine, HCTZ, Lopressor HCTZ Source control, see ID Echo ef 58%, grade 1 diastolic dysfunction. No heparin with drop HGB Asa hold for same   RENAL A:   Acute on Chronic Renal Failure Hyponatremia - likely in setting of volume depletion + chronic HCTZ    Oliguric Metabolic acidosis- Lactic acidosis Hyponatremia Hypo Calcemia- ca- 8.0 replete 10/3 P:  Monitor BMP daily Bicarb if k elevated Hold diuresis with pressor needs,  renal insuff  GI/GU A:   Chronic Fecal Impaction Neurogenic bladder R/o abdominal source as seems sicker than urine simple infection alone Also consider Colitis, with elevated WBC- 53.4, CT with findings of bowel thickening - ischemic bowel of concern P:    Manual disimpaction 3x/week NPO- day 3 vent, start TF at trickle 10 cc/hr PPI Surgery consulted, not a good surgical candidate, following lactic repeat decreased (4.9 on 10/1), ldh (482, 10/1) Recheck on 10/3 to trend as she remains pressor dependent  HEMATOLOGIC A:   Iron deficiency anemia - pt formerly on iron but stopped after she was told it improved Hgb-  8/6 Leukocytosis Thrombocytopenia (no heparin )106->87  P:   DVT prophylaxis, SQ heparin for hold for now This is not a picture of HITT Cbc daily scd to  Left leg  INFECTIOUS A:   Septic shock - suspected urinary source, rule out pulmonary, PNA right R/o abdo ischemia vs infectious colitis Chronic LLE Heel Wound MRSA- PCR+   P:    BCx2 9/29 >>2/5 gram neg rod>>> UC 9/29 >>100k mul species UA 9/29 >> 100 k >>>mul species BCx2 10/2>> Abx: vanc, start date 9/29>>> Abx: zosyn, start date 9/29>>>9/30 Abx: Diflucan Started 9/30>>>10/1 Abx- levaquin started 9/30>>> Abx: imipenem started 10/1>>> Once gram neg rod ID, narrow coverage Await echo for veg  ENDOCRINE A:   Hypothyroid   DM II R/o rel AI P:    Continue levothyroxine SSI Insulin on hold-hypogycemia TSH wnl Cortisol 36 therefore dc steroids Add ssi with Tf start  NEUROLOGIC A:    Chronic pain Hx paraplegia s/p spinal fx Insomnia   P:    RASS goal: -2 with WUA Hold home baclofen, gabapentin, oxycodone   Avoiding benzo  Family bedside updated , daughter and husband 9/30 10/2 change to full code per family request.  Interdisciplinary Family Meeting v Palliative Care Meeting: n/a  TODAY'S SUMMARY:  Remains on high flow O2. Start trickle tube feeds. Now full code per family request.  Alexandra Wiley ACNP Alexandra Wiley PCCM Pager [phone] till 3 pm If no answer page [phone] 06/23/2014, 8:22 AM  Reviewed above, examined.  She remains on full vent support with ARDS >> change to ARDS protocol 10/03.  Remains on pressors.  Too high risk for any surgical intervention.  Abd pain improved.  Continue conservative course for now.  Updated pt's family at bedside.  Explained that she will not likely improve quickly, and they might need to consider options for long term support of her critical illness.  They are optimistic about her chances for recovery still.  CC time 35 minutes.  Alexandra Mires, MD Alexandra Wiley  Pulmonary/Critical Care 06/23/2014, 11:15 AM Pager:  [phone] After 3pm call: [phone]

## 2014-06-23 NOTE — Progress Notes (Addendum)
ANTIBIOTIC CONSULT NOTE - INITIAL  Pharmacy Consult for vancomycin and zosyn Indication: pneumonia  No Known Allergies  Patient Measurements: Height: 4' 11.06" (150 cm) Weight: 197 lb 5 oz (89.5 kg) IBW/kg (Calculated) : 43.33   Vital Signs: Temp: 98.5 F (36.9 C) (10/03 1555) Temp Source: Oral (10/03 1555) Pulse Rate: 112 (10/03 1600) Intake/Output from previous day: 10/02 0701 - 10/03 0700 In: 1371.9 [I.V.:641; NG/GT:230.8; IV Piggyback:500] Out: 1675 [Urine:1675] Intake/Output from this shift: Total I/O In: 189.1 [I.V.:79.1; NG/GT:110] Out: 100 [Urine:100]  Labs:  Recent Labs  06/21/14 1130  06/22/14 0500 06/22/14 0925 06/22/14 1700 06/23/14 0400  WBC 48.1*  < > 48.6* 44.7* 36.1* 27.4*  HGB 8.9*  < > 8.6* 8.3* 8.1* 7.6*  PLT 173  < > 121* 113* 106* 87*  CREATININE 2.29*  --  2.47*  --   --  2.40*  < > = values in this interval not displayed. Estimated Creatinine Clearance: 21.3 ml/min (by C-G formula based on Cr of 2.4). No results found for this basename: VANCOTROUGH, VANCOPEAK, VANCORANDOM, [place], GENTPEAK, GENTRANDOM, [place], TOBRAPEAK, TOBRARND, AMIKACINPEAK, AMIKACINTROU, AMIKACIN,  in the last 72 hours   Microbiology: Recent Results (from the past 720 hour(s))  CULTURE, BLOOD (ROUTINE X 2)     Status: None   Collection Time    05/26/2014 12:30 PM      Result Value Ref Range Status   Specimen Description BLOOD RIGHT HAND   Final   Special Requests BOTTLES DRAWN AEROBIC AND ANAEROBIC 4MLS   Final   Culture  Setup Time     Final   Value: 05/25/2014 20:20     Performed at [facility]   Culture     Final   Value: ESCHERICHIA COLI     Note: Gram Stain Report Called to,Read Back By and Verified With: JAMES DUNLOP @ 1201 ON [id number] BY [facility]     Performed at [facility]   Report Status 06/23/2014 FINAL   Final   Organism ID, Bacteria ESCHERICHIA COLI   Final  URINE CULTURE     Status: None   Collection Time    06/08/2014 12:42 PM       Result Value Ref Range Status   Specimen Description URINE, CATHETERIZED   Final   Special Requests NONE   Final   Culture  Setup Time     Final   Value: 05/30/2014 18:11     Performed at [place]     Final   Value: >=100,000 COLONIES/ML     Performed at [facility]   Culture     Final   Value: Multiple bacterial morphotypes present, none predominant. Suggest appropriate recollection if clinically indicated.     Performed at [facility]   Report Status 06/21/2014 FINAL   Final  MRSA PCR SCREENING     Status: Abnormal   Collection Time    05/23/2014  8:32 PM      Result Value Ref Range Status   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 It is not     intended to diagnose MRSA     infection nor to guide or  monitor treatment for     MRSA infections.     RESULT CALLED TO, READ BACK BY AND VERIFIED WITH:     J.HOLLAND,RN 0003 06/21/14 M.CAMPBELL  CULTURE, BLOOD (ROUTINE X 2)     Status: None   Collection Time    05/22/2014 11:35 PM      Result Value Ref Range Status   Specimen Description BLOOD CENTRAL LINE   Final   Special Requests     Final   Value: BOTTLES DRAWN AEROBIC AND ANAEROBIC 10CC BLUE,8CC RED   Culture  Setup Time     Final   Value: 06/21/2014 09:12     Performed at [facility]   Culture     Final   Value:        BLOOD CULTURE RECEIVED NO GROWTH TO DATE CULTURE WILL BE HELD FOR 5 DAYS BEFORE ISSUING A FINAL NEGATIVE REPORT     Performed at [facility]   Report Status PENDING   Incomplete  CULTURE, BLOOD (ROUTINE X 2)     Status: None   Collection Time    06/22/14 10:05 AM      Result Value Ref Range Status   Specimen Description BLOOD RIGHT ANTECUBITAL   Final   Special Requests BOTTLES DRAWN AEROBIC ONLY 5CC   Final    Culture  Setup Time     Final   Value: 06/22/2014 14:39     Performed at [facility]   Culture     Final   Value:        BLOOD CULTURE RECEIVED NO GROWTH TO DATE CULTURE WILL BE HELD FOR 5 DAYS BEFORE ISSUING A FINAL NEGATIVE REPORT     Performed at [facility]   Report Status PENDING   Incomplete  CULTURE, BLOOD (ROUTINE X 2)     Status: None   Collection Time    06/22/14 10:08 AM      Result Value Ref Range Status   Specimen Description BLOOD RIGHT HAND   Final   Special Requests BOTTLES DRAWN AEROBIC ONLY 2CC   Final   Culture  Setup Time     Final   Value: 06/22/2014 14:39     Performed at [facility]   Culture     Final   Value:        BLOOD CULTURE RECEIVED NO GROWTH TO DATE CULTURE WILL BE HELD FOR 5 DAYS BEFORE ISSUING A FINAL NEGATIVE REPORT     Performed at [facility]   Report Status PENDING   Incomplete    Medical History: Past Medical History  Diagnosis Date  . Ulcer   . Hypertension   . Heart murmur   . Gout   . PVD (peripheral vascular disease)   . Pain in limb   . CHF (congestive heart failure)   . 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 Conus medullaris syndrome   . Arthritis   . Breast cancer ~ 1975    right  . Carpal tunnel syndrome of right wrist   . High cholesterol   . Angina   . Myocardial infarction 1996  . Pneumonia ~ 2002; ~1975  . Type II diabetes mellitus   . Exertional dyspnea   . Hypothyroidism   . Seizures 02/02/12    "used to; a long time ago"  . Stroke 10/2008    "spinal cord"  . Neurogenic bladder   . Irregular heartbeat     Assessment:  Patient is a a 68 y.o F on abx for sepsis.  Chest x-ray today showed "Worsening bilateral mid lung opacities superimposed on pre-existing extensive bilateral coarse reticular opacities worrisome for progression of multifocal infection/ARDS, though note, underlying edema is not excluded." To resume vancomycin and zosyn for PNA. Scr (crcl~21)  Vanc  9/30>>10/2>> resume 10/3 Zosyn 9/30>> 10/1>> resume 10/3 LVQ 9/30 >>10/3 Diflucan 9/30 >> 10/1 Imipenem 10/1 >>10/3  9/30 Blood x2 >> 1/2 E coli, R unasyn/septra,  9/30 Urine >> none predominant 10/1 Cdiff PCR >> not sent    Goal of Therapy:  Vancomycin trough level 15-20 mcg/ml  Plan:  1) vancomycin 1gm IV q48h (last dose given on 10/02, next dose on 10/04) 2) zosyn 3.375 gm IV q8h (infuse over 4 hrs)  Jeaneane Adamec P 06/23/2014,5:03 PM

## 2014-06-24 ENCOUNTER — Inpatient Hospital Stay (HOSPITAL_COMMUNITY): Payer: Medicare Other

## 2014-06-24 LAB — CBC
HCT: 21.5 % — ABNORMAL LOW (ref 36.0–46.0)
Hemoglobin: 7.3 g/dL — ABNORMAL LOW (ref 12.0–15.0)
MCH: 22.5 pg — ABNORMAL LOW (ref 26.0–34.0)
MCHC: 34 g/dL (ref 30.0–36.0)
MCV: 66.4 fL — ABNORMAL LOW (ref 78.0–100.0)
Platelets: 75 10*3/uL — ABNORMAL LOW (ref 150–400)
RBC: 3.24 MIL/uL — ABNORMAL LOW (ref 3.87–5.11)
RDW: 20.7 % — AB (ref 11.5–15.5)
WBC: 20.5 10*3/uL — ABNORMAL HIGH (ref 4.0–10.5)

## 2014-06-24 LAB — POCT I-STAT 3, ART BLOOD GAS (G3+)
ACID-BASE DEFICIT: 3 mmol/L — AB (ref 0.0–2.0)
Acid-base deficit: 3 mmol/L — ABNORMAL HIGH (ref 0.0–2.0)
Bicarbonate: 23.4 mEq/L (ref 20.0–24.0)
Bicarbonate: 24.4 mEq/L — ABNORMAL HIGH (ref 20.0–24.0)
O2 Saturation: 89 %
O2 Saturation: 99 %
PCO2 ART: 51.9 mmHg — AB (ref 35.0–45.0)
PH ART: 7.278 — AB (ref 7.350–7.450)
TCO2: 25 mmol/L (ref 0–100)
TCO2: 26 mmol/L (ref 0–100)
pCO2 arterial: 50.1 mmHg — ABNORMAL HIGH (ref 35.0–45.0)
pH, Arterial: 7.281 — ABNORMAL LOW (ref 7.350–7.450)
pO2, Arterial: 144 mmHg — ABNORMAL HIGH (ref 80.0–100.0)
pO2, Arterial: 64 mmHg — ABNORMAL LOW (ref 80.0–100.0)

## 2014-06-24 LAB — BASIC METABOLIC PANEL
ANION GAP: 9 (ref 5–15)
BUN: 30 mg/dL — ABNORMAL HIGH (ref 6–23)
CO2: 24 mEq/L (ref 19–32)
Calcium: 8.2 mg/dL — ABNORMAL LOW (ref 8.4–10.5)
Chloride: 99 mEq/L (ref 96–112)
Creatinine, Ser: 2.31 mg/dL — ABNORMAL HIGH (ref 0.50–1.10)
GFR, EST AFRICAN AMERICAN: 23 mL/min — AB (ref >90)
GFR, EST NON AFRICAN AMERICAN: 20 mL/min — AB (ref >90)
Glucose, Bld: 116 mg/dL — ABNORMAL HIGH (ref 70–99)
POTASSIUM: 4 meq/L (ref 3.7–5.3)
Sodium: 132 mEq/L — ABNORMAL LOW (ref 137–147)

## 2014-06-24 LAB — GLUCOSE, CAPILLARY
GLUCOSE-CAPILLARY: 101 mg/dL — AB (ref 70–99)
GLUCOSE-CAPILLARY: 114 mg/dL — AB (ref 70–99)
GLUCOSE-CAPILLARY: 114 mg/dL — AB (ref 70–99)
GLUCOSE-CAPILLARY: 114 mg/dL — AB (ref 70–99)
GLUCOSE-CAPILLARY: 117 mg/dL — AB (ref 70–99)
GLUCOSE-CAPILLARY: 122 mg/dL — AB (ref 70–99)

## 2014-06-24 LAB — PHOSPHORUS: PHOSPHORUS: 3.7 mg/dL (ref 2.3–4.6)

## 2014-06-24 LAB — MAGNESIUM: MAGNESIUM: 2 mg/dL (ref 1.5–2.5)

## 2014-06-24 MED ORDER — PROPOFOL 10 MG/ML IV EMUL
5.0000 ug/kg/min | INTRAVENOUS | Status: DC
  Administered 2014-06-24: 10 ug/kg/min via INTRAVENOUS
  Administered 2014-06-25: 20 ug/kg/min via INTRAVENOUS
  Administered 2014-06-25: 5 ug/kg/min via INTRAVENOUS
  Administered 2014-06-26: 25 ug/kg/min via INTRAVENOUS
  Administered 2014-06-26 (×2): 20 ug/kg/min via INTRAVENOUS
  Filled 2014-06-24 (×7): qty 100

## 2014-06-24 NOTE — Progress Notes (Signed)
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   Initial complaints of AMS & low blood sugar.     STUDIES:   9/30 renal- port- Normal Uss appearance of kidneys, bladder decompressed with foley cath in place, no hydronephrosis. 9/30 ct chest - patchy infiltrates 9/30 ct abdo-colitis, free fluid 10/2 2 d: Echo ef 31%, grade 1 diastolic dysfunction.  SIGNIFICANT EVENTS: 9/30  Brought to [facility] by EMS for AMS and hypoglycemia, work up consistent with septic shock   9/30 Intubated >> 10/1- refractory shock 10/1- surgery consulted 10/3 full code, ARDS protocol 10/4 on 90% fio2  SUBJECTIVE:  Sedated on vent. Arouses to voice.  VITAL SIGNS: Temp:  [98.1 F (36.7 C)-98.5 F (36.9 C)] 98.3 F (36.8 C) (10/04 1140) Pulse Rate:  [81-120] 84 (10/04 1226) Resp:  [6-35] 35 (10/04 1226) SpO2:  [84 %-100 %] 100 % (10/04 1226) Arterial Line BP: (94-138)/(46-85) 122/85 mmHg (10/04 1200) FiO2 (%):  [60 %-100 %] 80 % (10/04 1226) Weight:  [201 lb 15.1 oz (91.6 kg)] 201 lb 15.1 oz (91.6 kg) (10/04 0400)  HEMODYNAMICS: CVP:  [11 mmHg-13 mmHg] 13 mmHg  VENT SETTINGS:  Vent Mode:  [-] PRVC FiO2 (%):  [60 %-100 %] 90 % Set Rate:  [35 bmp] 35 bmp Vt Set:  [380 mL] 380 mL PEEP:  [10 cmH20-14 cmH20] 14 cmH20 Plateau Pressure:  [25 cmH20-35 cmH20] 33 cmH20  INTAKE / OUTPUT:  Intake/Output Summary (Last 24 hours) at 06/24/14 5400 Last data filed at 06/24/14 0800  Gross per 24 hour  Intake 1073.87 ml  Output   1350 ml  Net -276.13 ml   PHYSICAL EXAMINATION: General:  Sedated, NAD   Neuro:  rass -2, opens eyes to name at times HEENT:  NCAT, no JVD at 30%   Cardiovascular:  s1 s2 Tachy, no m/r/g Lungs:  Reduced  , diminished in bases Abdomen:  Soft, NT, distended mild, no r/g. No bs. TF at 10 ml/hr Musculoskeletal:  S/p R AKA, pulses weak LLE Skin:  Heel ulcer L, no necrotic tissue, red/pink granulation tissue surrounded by min maceration, quarter in size . Left foot cool and mottled  Rt AKA noted  LABS: CBC Recent Labs     06/22/14  1700  06/23/14  0400  06/24/14  0507  WBC  36.1*  27.4*  20.5*  HGB  8.1*  7.6*  7.3*  HCT  23.1*  22.4*  21.5*  PLT  106*  87*  75*    BMET Recent Labs     06/22/14  0500  06/23/14  0400  06/24/14  0507  NA  126*  134*  132*  K  4.2  4.1  4.0  CL  93*  100  99  CO2  21  22  24   BUN  26*  29*  30*  CREATININE  2.47*  2.40*  2.31*  GLUCOSE  135*  105*  116*    Electrolytes Recent Labs     06/22/14  0500  06/22/14  0925  06/23/14  0400  06/24/14  0507  CALCIUM  7.7*   --   8.0*  8.2*  MG   --   2.1   --   2.0  PHOS   --    --    --   3.7    ABG Recent Labs     06/22/14  1720  06/23/14  0355  06/24/14  0044  PHART  7.315*  7.350  7.281*  PCO2ART  41.5  41.2  51.9*  PO2ART  88.4  60.6*  64.0*    Liver Enzymes Recent Labs     06/22/14  0500  AST  99*  ALT  28  ALKPHOS  74  BILITOT  1.4*  ALBUMIN  1.9*    Glucose Recent Labs     06/23/14  1138  06/23/14  1510  06/23/14  1945  06/24/14  0022  06/24/14  0423  06/24/14  0704  GLUCAP  111*  104*  104*  117*  114*  114*    Imaging Dg Chest Port 1 View  06/24/2014   CLINICAL DATA:  Congestive heart failure and sepsis  EXAM: PORTABLE CHEST - 1 VIEW  COMPARISON:  Portable chest x-ray of June 23, 2014 and June 20, 2014  FINDINGS: The lungs are well-expanded. There are widespread with nodular interstitial infiltrates which are fairly stable. No significant pleural effusion is demonstrated. There is partial obscuration of the lateral aspect of the left hemidiaphragm which is stable. The cardiac silhouette is normal.  The pulmonary vascularity is not clearly engorged. Mediastinum is normal in width.  The endotracheal tube tip lies approximately 3 cm above the crotch of the carina. The esophagogastric tube tip and proximal port project below the level of the GE junction. The left internal jugular venous catheter tip lies at the junction of the left internal jugular vein with the left subclavian vein. The bony thorax is unremarkable.  IMPRESSION: There are persistent reticulonodular infiltrates not greatly changed from yesterday's exam consistent with diffuse pneumonia. The support tubes and lines are in appropriate position. There has been dramatic interval deterioration in the appearance of the chest since the admission study of June 20, 2014.   Electronically Signed   By: David  Martinique   On: 06/24/2014 07:19   Dg Chest Port 1 View  06/23/2014   CLINICAL DATA:  Pneumonia, subsequent encounter  EXAM: PORTABLE CHEST - 1 VIEW  COMPARISON:  06/22/2014; 06/21/2014; 06/01/2014; chest CT - 06/21/2014  FINDINGS: Grossly unchanged enlarged cardiac silhouette and mediastinal contours with atherosclerotic plaque within the thoracic aorta. Stable positioning of support apparatus. Pulmonary vasculature appears less distinct on the present examination. Worsening bilateral mid lung heterogeneous interstitial opacities superimposed on extensive bilateral coarsened reticular opacities. Bilateral effusions are unchanged. Unchanged bones.  IMPRESSION: 1.  Stable positioning of support apparatus.  No pneumothorax. 2. Worsening bilateral mid lung opacities superimposed on pre-existing extensive bilateral coarse reticular opacities worrisome for progression of multifocal infection/ARDS, though note, underlying edema is not excluded. Continued attention on follow-up is recommended.   Electronically Signed   By: Sandi Mariscal M.D.   On: 06/23/2014 07:44       ASSESSMENT / PLAN:  PULMONARY OETT>>9/30>> A: Acute respiratory failure with  ARDS in setting of septic shock, colitis and possible HCAP. P:   ARDS protocol started 10/3 with worsening resp acidosis. Permissive hypercapnia as we are at rate of 35 Follow abg's May not tolerate low stretch  CARDIOVASCULAR CVL 9/30 [facility]>> Rt femoral Aline 10/01 >> A:   Septic shock from colitis, HCAP,  and Ecoli bacteremia. Hx HTN, Chronic diastolic heart failure (EF 60-65% 01/2012). NSTEMI with demand ischemia. P:   Pressors to keep MAP > 60 Hold outpt anti-HTN meds  RENAL A:   AKI in setting of sepsis. CKD. Lactic acidosis. Hypocalcemia. Neurogenic bladder. P:    Monitor BMP daily Bicarb if k elevated Hold diuresis with pressor needs,  renal insuff Negative I/O without lasix 10/4  GI/GU A:   Chronic Fecal Impaction. Ischemic colitis. P:    Manual disimpaction 3x/week TF at trickle 10 cc/hr PPI Surgery consulted, not a good surgical candidate, following.   HEMATOLOGIC A:   Iron deficiency anemia - pt formerly on iron but stopped after she was told it improved Hgb- 8/6 Leukocytosis Thrombocytopenia (no heparin )106->87->85 P:   SCD's for DVT prevention F/u CBC  INFECTIOUS A:   Septic shock 2nd to colitis, HCAP. Chronic Lt lower extremity heel wound. MRSA positive. P:    BCx2 9/29 >>2/5 gram neg rod>>>ecoli BCx2 10/2>> Abx: vanc, start date 9/29>>> Abx: zosyn, start date 9/29>>>9/30 restart 10/3>> Abx: Diflucan Started 9/30>>>10/1 Abx- levaquin started 9/30>>>10/3 Abx: imipenem started 10/1>>>10/3  ENDOCRINE A:   Hypothyroid   DM II R/o rel AI P:    Continue levothyroxine SSI Insulin on hold-hypogycemia TSH wnl Cortisol 36 therefore dc steroids Add ssi with Tf start  NEUROLOGIC A:    Chronic pain Hx paraplegia s/p spinal fx Insomnia   P:    RASS goal: -2 with WUA Hold home baclofen, gabapentin, oxycodone   Avoiding benzo  No family at bedside 10/2 change to full code per family request.  Interdisciplinary Family Meeting v  Palliative Care Meeting: n/a  TODAY'S SUMMARY:  Remains on high flow O2. Started trickle tube feeds 10-3. Full code per family request. [place] worse on ARDS protocol. Agitated with any stimulation. Ecoli bacteremia identified and abx adjusted. Peognosis remains poor.  Richardson Landry Minor ACNP Maryanna Shape PCCM Pager [phone] till 3 pm If no answer page [phone] 06/24/2014, 8:33 AM  Reviewed above, examined.  She has ARDS, septic shock in setting of ischemic colitis, E coli Bacteremia, and presumed HCAP.  Continue pressors, full vent support, Abx.  Conservative management of abdomen per surgery.  CC time 35 minutes.  Chesley Mires, MD [facility] Pulmonary/Critical Care 06/24/2014, 1:39 PM Pager:  [phone] After 3pm call: [phone]

## 2014-06-24 NOTE — Progress Notes (Signed)
eLink Physician-Brief Progress Note Patient Name: Alexandra Wiley DOB: 1946-08-25 MRN: [id number]   Date of Service  06/24/2014  HPI/Events of Note  Patient with severe lung injury with significant desaturation when not adequately sedated.  Presently on fentanyl drip at 410mcg/hr and not adequately sedated for degree of lung injury and vent support required  eICU Interventions  Propofol drip     Intervention Category Minor Interventions: Agitation / anxiety - evaluation and management  Mauri Brooklyn, P 06/24/2014, 3:55 PM

## 2014-06-24 NOTE — Progress Notes (Signed)
Subjective: Intubated, sedated on vent  Objective: Vital signs in last 24 hours: Temp:  [98.1 F (36.7 C)-98.5 F (36.9 C)] 98.1 F (36.7 C) (10/04 0748) Pulse Rate:  [85-120] 112 (10/04 0748) Resp:  [6-35] 35 (10/04 0748) SpO2:  [84 %-100 %] 100 % (10/04 0748) Arterial Line BP: (94-136)/(46-58) 107/51 mmHg (10/04 0600) FiO2 (%):  [60 %-100 %] 90 % (10/04 0800) Weight:  [201 lb 15.1 oz (91.6 kg)] 201 lb 15.1 oz (91.6 kg) (10/04 0400) Last BM Date: 05/25/2014  Intake/Output from previous day: 10/03 0701 - 10/04 0700 In: 1093.1 [I.V.:603.1; NG/GT:390; IV Piggyback:100] Out: 1290 [Urine:1290] Intake/Output this shift: Total I/O In: 67.5 [I.V.:57.5; NG/GT:10] Out: 160 [Urine:160]  GI: soft, nontender nondistended  Lab Results:   Recent Labs  06/23/14 0400 06/24/14 0507  WBC 27.4* 20.5*  HGB 7.6* 7.3*  HCT 22.4* 21.5*  PLT 87* 75*   BMET  Recent Labs  06/23/14 0400 06/24/14 0507  NA 134* 132*  K 4.1 4.0  CL 100 99  CO2 22 24  GLUCOSE 105* 116*  BUN 29* 30*  CREATININE 2.40* 2.31*  CALCIUM 8.0* 8.2*   PT/INR No results found for this basename: LABPROT, INR,  in the last 72 hours ABG  Recent Labs  06/23/14 0355 06/24/14 0044  PHART 7.350 7.281*  HCO3 22.3 24.4*    Studies/Results: Dg Chest Port 1 View  06/24/2014   CLINICAL DATA:  Congestive heart failure and sepsis  EXAM: PORTABLE CHEST - 1 VIEW  COMPARISON:  Portable chest x-ray of June 23, 2014 and June 20, 2014  FINDINGS: The lungs are well-expanded. There are widespread with nodular interstitial infiltrates which are fairly stable. No significant pleural effusion is demonstrated. There is partial obscuration of the lateral aspect of the left hemidiaphragm which is stable. The cardiac silhouette is normal. The pulmonary vascularity is not clearly engorged. Mediastinum is normal in width.  The endotracheal tube tip lies approximately 3 cm above the crotch of the carina. The esophagogastric tube  tip and proximal port project below the level of the GE junction. The left internal jugular venous catheter tip lies at the junction of the left internal jugular vein with the left subclavian vein. The bony thorax is unremarkable.  IMPRESSION: There are persistent reticulonodular infiltrates not greatly changed from yesterday's exam consistent with diffuse pneumonia. The support tubes and lines are in appropriate position. There has been dramatic interval deterioration in the appearance of the chest since the admission study of June 20, 2014.   Electronically Signed   By: David  Martinique   On: 06/24/2014 07:19   Dg Chest Port 1 View  06/23/2014   CLINICAL DATA:  Pneumonia, subsequent encounter  EXAM: PORTABLE CHEST - 1 VIEW  COMPARISON:  06/22/2014; 06/21/2014; 05/29/2014; chest CT - 06/21/2014  FINDINGS: Grossly unchanged enlarged cardiac silhouette and mediastinal contours with atherosclerotic plaque within the thoracic aorta. Stable positioning of support apparatus. Pulmonary vasculature appears less distinct on the present examination. Worsening bilateral mid lung heterogeneous interstitial opacities superimposed on extensive bilateral coarsened reticular opacities. Bilateral effusions are unchanged. Unchanged bones.  IMPRESSION: 1.  Stable positioning of support apparatus.  No pneumothorax. 2. Worsening bilateral mid lung opacities superimposed on pre-existing extensive bilateral coarse reticular opacities worrisome for progression of multifocal infection/ARDS, though note, underlying edema is not excluded. Continued attention on follow-up is recommended.   Electronically Signed   By: Sandi Mariscal M.D.   On: 06/23/2014 07:44    Anti-infectives: Anti-infectives   Start  Dose/Rate Route Frequency Ordered Stop   06/24/14 1300  vancomycin (VANCOCIN) IVPB 1000 mg/200 mL premix     1,000 mg 200 mL/hr over 60 Minutes Intravenous Every 48 hours 06/23/14 1718     06/23/14 1800  piperacillin-tazobactam  (ZOSYN) IVPB 3.375 g     3.375 g 12.5 mL/hr over 240 Minutes Intravenous Every 8 hours 06/23/14 1718     06/22/14 2030  levofloxacin (LEVAQUIN) IVPB 500 mg  Status:  Discontinued     500 mg 100 mL/hr over 60 Minutes Intravenous Every 48 hours 06/09/2014 2011 06/23/14 1702   06/22/14 1300  vancomycin (VANCOCIN) IVPB 1000 mg/200 mL premix  Status:  Discontinued     1,000 mg 200 mL/hr over 60 Minutes Intravenous Every 48 hours 05/31/2014 1324 06/23/14 1615   06/21/14 2000  fluconazole (DIFLUCAN) IVPB 400 mg  Status:  Discontinued     400 mg 100 mL/hr over 120 Minutes Intravenous Every 24 hours 06/01/2014 1914 06/21/14 0955   06/21/14 2000  fluconazole (DIFLUCAN) IVPB 200 mg  Status:  Discontinued     200 mg 100 mL/hr over 60 Minutes Intravenous Every 24 hours 06/21/14 0955 06/21/14 1015   06/21/14 1245  imipenem-cilastatin (PRIMAXIN) 250 mg in sodium chloride 0.9 % 100 mL IVPB  Status:  Discontinued     250 mg 200 mL/hr over 30 Minutes Intravenous Every 12 hours 06/21/14 1230 06/23/14 1615   06/14/2014 2100  piperacillin-tazobactam (ZOSYN) IVPB 3.375 g  Status:  Discontinued     3.375 g 12.5 mL/hr over 240 Minutes Intravenous Every 8 hours 06/13/2014 2051 06/21/14 1230   06/17/2014 2030  levofloxacin (LEVAQUIN) IVPB 750 mg     750 mg 100 mL/hr over 90 Minutes Intravenous  Once 06/19/2014 2011 05/27/2014 2327   06/13/2014 2000  piperacillin-tazobactam (ZOSYN) IVPB 2.25 g  Status:  Discontinued     2.25 g 100 mL/hr over 30 Minutes Intravenous Every 6 hours 06/19/2014 1324 06/01/2014 2051   06/11/2014 1915  fluconazole (DIFLUCAN) IVPB 800 mg     800 mg 200 mL/hr over 120 Minutes Intravenous  Once 05/24/2014 1914 05/30/2014 2218   06/08/2014 1230  piperacillin-tazobactam (ZOSYN) IVPB 3.375 g     3.375 g 100 mL/hr over 30 Minutes Intravenous  Once 06/19/2014 1221 05/28/2014 1305   06/15/2014 1230  vancomycin (VANCOCIN) IVPB 1000 mg/200 mL premix     1,000 mg 200 mL/hr over 60 Minutes Intravenous  Once 06/04/2014 1221 06/15/2014  1418      Assessment/Plan: Colitis  E coli bacteremia being treated, has colitis but appears to be slowly getting better. I think continued abx, bowel rest are appropriate for now. If worsens or does not continue getting better consider repeat ct.    [facility] 06/24/2014

## 2014-06-25 ENCOUNTER — Inpatient Hospital Stay (HOSPITAL_COMMUNITY): Payer: Medicare Other

## 2014-06-25 DIAGNOSIS — G9340 Encephalopathy, unspecified: Secondary | ICD-10-CM

## 2014-06-25 LAB — GLUCOSE, CAPILLARY
GLUCOSE-CAPILLARY: 103 mg/dL — AB (ref 70–99)
Glucose-Capillary: 97 mg/dL (ref 70–99)
Glucose-Capillary: 99 mg/dL (ref 70–99)
Glucose-Capillary: 99 mg/dL (ref 70–99)
Glucose-Capillary: 96 mg/dL (ref 70–99)

## 2014-06-25 LAB — BASIC METABOLIC PANEL
ANION GAP: 11 (ref 5–15)
BUN: 32 mg/dL — ABNORMAL HIGH (ref 6–23)
CHLORIDE: 101 meq/L (ref 96–112)
CO2: 23 mEq/L (ref 19–32)
CREATININE: 2.14 mg/dL — AB (ref 0.50–1.10)
Calcium: 8.5 mg/dL (ref 8.4–10.5)
GFR calc non Af Amer: 22 mL/min — ABNORMAL LOW (ref >90)
GFR, EST AFRICAN AMERICAN: 26 mL/min — AB (ref >90)
Glucose, Bld: 115 mg/dL — ABNORMAL HIGH (ref 70–99)
POTASSIUM: 3.6 meq/L — AB (ref 3.7–5.3)
Sodium: 135 mEq/L — ABNORMAL LOW (ref 137–147)

## 2014-06-25 LAB — CBC
HEMATOCRIT: 22.3 % — AB (ref 36.0–46.0)
Hemoglobin: 7.6 g/dL — ABNORMAL LOW (ref 12.0–15.0)
MCH: 22.6 pg — ABNORMAL LOW (ref 26.0–34.0)
MCHC: 34.1 g/dL (ref 30.0–36.0)
MCV: 66.4 fL — AB (ref 78.0–100.0)
PLATELETS: 94 10*3/uL — AB (ref 150–400)
RBC: 3.36 MIL/uL — ABNORMAL LOW (ref 3.87–5.11)
RDW: 21.1 % — ABNORMAL HIGH (ref 11.5–15.5)
WBC: 21.1 10*3/uL — ABNORMAL HIGH (ref 4.0–10.5)

## 2014-06-25 LAB — BLOOD GAS, ARTERIAL
Acid-base deficit: 2.3 mmol/L — ABNORMAL HIGH (ref 0.0–2.0)
Bicarbonate: 23.2 mEq/L (ref 20.0–24.0)
Drawn by: 24513
FIO2: 70 %
MECHVT: 380 mL
O2 Saturation: 97.6 %
PCO2 ART: 48.6 mmHg — AB (ref 35.0–45.0)
PEEP/CPAP: 14 cmH2O
PO2 ART: 105 mmHg — AB (ref 80.0–100.0)
Patient temperature: 98.1
RATE: 35 resp/min
TCO2: 24.7 mmol/L (ref 0–100)
pH, Arterial: 7.299 — ABNORMAL LOW (ref 7.350–7.450)

## 2014-06-25 LAB — PHOSPHORUS: Phosphorus: 3.5 mg/dL (ref 2.3–4.6)

## 2014-06-25 LAB — MAGNESIUM: Magnesium: 2 mg/dL (ref 1.5–2.5)

## 2014-06-25 LAB — LACTIC ACID, PLASMA: LACTIC ACID, VENOUS: 1.4 mmol/L (ref 0.5–2.2)

## 2014-06-25 NOTE — Progress Notes (Signed)
RT and co-RT attempted to to place a radial arterial line into pt. But was unsuccessful. Dr. Joya Gaskins granted permission verbally for the attempt.

## 2014-06-25 NOTE — Progress Notes (Signed)
Subjective: Intubated and sedated  Objective: Vital signs in last 24 hours: Temp:  [98 F (36.7 C)-98.4 F (36.9 C)] 98 F (36.7 C) (10/05 0824) Pulse Rate:  [68-95] 69 (10/05 0730) Resp:  [0-35] 35 (10/05 0800) BP: (63-137)/(24-57) 109/51 mmHg (10/05 0800) SpO2:  [95 %-100 %] 100 % (10/05 0730) Arterial Line BP: (80-158)/(36-85) 125/52 mmHg (10/05 0800) FiO2 (%):  [70 %-90 %] 70 % (10/05 0800) Weight:  [202 lb 13.2 oz (92 kg)] 202 lb 13.2 oz (92 kg) (10/05 0309) Last BM Date: 05/26/2014  Intake/Output from previous day: 10/04 0701 - 10/05 0700 In: 1780.8 [I.V.:1490.8; NG/GT:240; IV Piggyback:50] Out: 1965 [Urine:1965] Intake/Output this shift: Total I/O In: 79.1 [I.V.:69.1; NG/GT:10] Out: 100 [Urine:100]  Resp: rhonchi bilaterally Cardio: regular rate and rhythm GI: soft and appears nontender  Lab Results:   Recent Labs  06/24/14 0507 06/25/14 0400  WBC 20.5* 21.1*  HGB 7.3* 7.6*  HCT 21.5* 22.3*  PLT 75* 94*   BMET  Recent Labs  06/24/14 0507 06/25/14 0400  NA 132* 135*  K 4.0 3.6*  CL 99 101  CO2 24 23  GLUCOSE 116* 115*  BUN 30* 32*  CREATININE 2.31* 2.14*  CALCIUM 8.2* 8.5   PT/INR No results found for this basename: LABPROT, INR,  in the last 72 hours ABG  Recent Labs  06/24/14 1501 06/25/14 0339  PHART 7.278* 7.299*  HCO3 23.4 23.2    Studies/Results: Dg Chest Port 1 View  06/25/2014   CLINICAL DATA:  Followup.  Evaluate endotracheal tube.  EXAM: PORTABLE CHEST - 1 VIEW  COMPARISON:  Prior radiograph from 06/24/2014  FINDINGS: Tip of the endotracheal tube is positioned 1.7 cm above the carina. Enteric tube courses into the abdomen with nonvisualization of the tip. Left IJ approach central venous catheter is stable in position.  Cardiac and mediastinal silhouettes are unchanged.  Widespread nodular interstitial infiltrates are not significantly changed relative to prior study. Partial obscure a shin of the lateral aspect of left  hemidiaphragm is stable. No large pleural effusion. No pneumothorax.  Osseous structures are unchanged.  IMPRESSION: 1. Tip of the endotracheal tube 1.7 cm above the carina. Remaining support apparatus in stable position. 2. No significant interval change in widespread reticulonodular infiltrates involving the lungs bilaterally, most compatible ARDS and possible pneumonia.   Electronically Signed   By: Jeannine Boga M.D.   On: 06/25/2014 06:46   Dg Chest Port 1 View  06/24/2014   CLINICAL DATA:  Congestive heart failure and sepsis  EXAM: PORTABLE CHEST - 1 VIEW  COMPARISON:  Portable chest x-ray of June 23, 2014 and June 20, 2014  FINDINGS: The lungs are well-expanded. There are widespread with nodular interstitial infiltrates which are fairly stable. No significant pleural effusion is demonstrated. There is partial obscuration of the lateral aspect of the left hemidiaphragm which is stable. The cardiac silhouette is normal. The pulmonary vascularity is not clearly engorged. Mediastinum is normal in width.  The endotracheal tube tip lies approximately 3 cm above the crotch of the carina. The esophagogastric tube tip and proximal port project below the level of the GE junction. The left internal jugular venous catheter tip lies at the junction of the left internal jugular vein with the left subclavian vein. The bony thorax is unremarkable.  IMPRESSION: There are persistent reticulonodular infiltrates not greatly changed from yesterday's exam consistent with diffuse pneumonia. The support tubes and lines are in appropriate position. There has been dramatic interval deterioration in the appearance of  the chest since the admission study of June 20, 2014.   Electronically Signed   By: David  Martinique   On: 06/24/2014 07:19    Anti-infectives: Anti-infectives   Start     Dose/Rate Route Frequency Ordered Stop   06/24/14 1300  vancomycin (VANCOCIN) IVPB 1000 mg/200 mL premix     1,000 mg 200  mL/hr over 60 Minutes Intravenous Every 48 hours 06/23/14 1718     06/23/14 1800  piperacillin-tazobactam (ZOSYN) IVPB 3.375 g     3.375 g 12.5 mL/hr over 240 Minutes Intravenous Every 8 hours 06/23/14 1718     06/22/14 2030  levofloxacin (LEVAQUIN) IVPB 500 mg  Status:  Discontinued     500 mg 100 mL/hr over 60 Minutes Intravenous Every 48 hours 05/26/2014 2011 06/23/14 1702   06/22/14 1300  vancomycin (VANCOCIN) IVPB 1000 mg/200 mL premix  Status:  Discontinued     1,000 mg 200 mL/hr over 60 Minutes Intravenous Every 48 hours 05/22/2014 1324 06/23/14 1615   06/21/14 2000  fluconazole (DIFLUCAN) IVPB 400 mg  Status:  Discontinued     400 mg 100 mL/hr over 120 Minutes Intravenous Every 24 hours 05/28/2014 1914 06/21/14 0955   06/21/14 2000  fluconazole (DIFLUCAN) IVPB 200 mg  Status:  Discontinued     200 mg 100 mL/hr over 60 Minutes Intravenous Every 24 hours 06/21/14 0955 06/21/14 1015   06/21/14 1245  imipenem-cilastatin (PRIMAXIN) 250 mg in sodium chloride 0.9 % 100 mL IVPB  Status:  Discontinued     250 mg 200 mL/hr over 30 Minutes Intravenous Every 12 hours 06/21/14 1230 06/23/14 1615   05/23/2014 2100  piperacillin-tazobactam (ZOSYN) IVPB 3.375 g  Status:  Discontinued     3.375 g 12.5 mL/hr over 240 Minutes Intravenous Every 8 hours 05/23/2014 2051 06/21/14 1230   06/12/2014 2030  levofloxacin (LEVAQUIN) IVPB 750 mg     750 mg 100 mL/hr over 90 Minutes Intravenous  Once 06/01/2014 2011 06/16/2014 2327   06/04/2014 2000  piperacillin-tazobactam (ZOSYN) IVPB 2.25 g  Status:  Discontinued     2.25 g 100 mL/hr over 30 Minutes Intravenous Every 6 hours 06/05/2014 1324 06/07/2014 2051   06/17/2014 1915  fluconazole (DIFLUCAN) IVPB 800 mg     800 mg 200 mL/hr over 120 Minutes Intravenous  Once 06/04/2014 1914 05/30/2014 2218   06/11/2014 1230  piperacillin-tazobactam (ZOSYN) IVPB 3.375 g     3.375 g 100 mL/hr over 30 Minutes Intravenous  Once 06/03/2014 1221 06/14/2014 1305   06/08/2014 1230  vancomycin (VANCOCIN)  IVPB 1000 mg/200 mL premix     1,000 mg 200 mL/hr over 60 Minutes Intravenous  Once 05/28/2014 1221 06/08/2014 1418      Assessment/Plan: s/p * No surgery found * Continue abx Wean pressors if possible Seems to be making slow improvement. Will continue to monitor  LOS: 5 days    TOTH III,PAUL S 06/25/2014

## 2014-06-25 NOTE — Progress Notes (Signed)
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   Initial complaints of AMS & low blood sugar.     STUDIES:   9/30 renal- port- Normal Uss appearance of kidneys, bladder decompressed with foley cath in place, no hydronephrosis. 9/30 ct chest - patchy infiltrates 9/30 ct abdo-colitis, free fluid  10/2 2 d: Echo ef 16%, grade 1 diastolic dysfunction.  SIGNIFICANT EVENTS: 9/30  Brought to [facility] by EMS for AMS and hypoglycemia, work up consistent with septic shock   9/30 Intubated >> 10/1- refractory shock 10/1- surgery consulted 10/3 full code, ARDS protocol 10/4 on 90% fio2  SUBJECTIVE: Sedated, On vent. Family not present.   VITAL SIGNS: Temp:  [98 F (36.7 C)-98.4 F (36.9 C)] 98 F (36.7 C) (10/05 0358) Pulse Rate:  [73-95] 73 (10/05 0600) Resp:  [0-35] 6 (10/05 0600) BP: (63-137)/(24-57) 137/54 mmHg (10/05 0600) SpO2:  [95 %-100 %] 100 % (10/05 0600) Arterial Line BP: (80-158)/(36-85) 154/60 mmHg (10/05 0600) FiO2 (%):  [70 %-90 %] 70 % (10/05 0400) Weight:  [202 lb 13.2 oz (92 kg)] 202 lb 13.2 oz (92 kg) (10/05 0309)  HEMODYNAMICS: CVP:  [12 mmHg-13 mmHg] 13 mmHg  VENT SETTINGS:  Vent Mode:  [-] PRVC FiO2 (%):  [70 %-90 %] 70 % Set Rate:  [35 bmp] 35 bmp Vt Set:  [380 mL] 380 mL PEEP:  [14 cmH20] 14 cmH20 Plateau Pressure:  [29 cmH20-37 cmH20] 29 cmH20  INTAKE / OUTPUT:  Intake/Output Summary (Last 24 hours) at 06/25/14 0753 Last data filed at 06/25/14 0600  Gross per 24 hour  Intake 1714.23 ml  Output   1890 ml  Net -175.77 ml   PHYSICAL EXAMINATION: General:  Sedated, NAD   Neuro:  rass -2, not opening eyes HEENT:  NCAT,   Cardiovascular:  s1 s2 regular, no m/r/g Lungs:  Coarse, mild crackles bilat- basilar Abdomen:  Soft, NT, distended mild, no r/g. Hypoactive bowel sounds. TF at 10 ml/hr Musculoskeletal:  S/p R AKA, pulses weak LLE, dark, cold feet. Skin:  Heel ulcer L, no necrotic tissue, red/pink granulation tissue surrounded by min maceration, quarter in size . Left foot cool and mottled.  Rt AKA noted  LABS: CBC Recent Labs     06/23/14  0400  06/24/14  0507  06/25/14  0400  WBC  27.4*  20.5*  21.1*  HGB  7.6*  7.3*  7.6*  HCT  22.4*  21.5*  22.3*  PLT  87*  75*  94*    BMET Recent Labs     06/23/14  0400  06/24/14  0507  06/25/14  0400  NA  134*  132*  135*  K  4.1  4.0  3.6*  CL  100  99  101  CO2  22  24  23   BUN  29*  30*  32*  CREATININE  2.40*  2.31*  2.14*  GLUCOSE  105*  116*  115*    Electrolytes Recent Labs     06/22/14  0925  06/23/14  0400  06/24/14  0507  06/25/14  0400  CALCIUM   --   8.0*  8.2*  8.5  MG  2.1   --   2.0  2.0  PHOS   --    --   3.7  3.5    ABG Recent Labs     06/24/14  0044  06/24/14  1501  06/25/14  0339  PHART  7.281*  7.278*  7.299*  PCO2ART  51.9*  50.1*  48.6*  PO2ART  64.0*  144.0*  105.0*    Liver Enzymes No results found for this basename: AST, ALT, ALKPHOS, BILITOT, ALBUMIN,  in the last 72 hours  Glucose Recent Labs     06/24/14  0423  06/24/14  0704  06/24/14  1455  06/24/14  1948  06/24/14  2258  06/25/14  0356  GLUCAP  114*  114*  122*  114*  101*  97    Imaging Dg Chest Port 1 View  06/25/2014   CLINICAL DATA:  Followup.  Evaluate endotracheal tube.  EXAM: PORTABLE CHEST - 1 VIEW  COMPARISON:  Prior radiograph from 06/24/2014  FINDINGS: Tip of the endotracheal tube is positioned 1.7 cm above the carina. Enteric tube courses into the abdomen with nonvisualization of the tip. Left IJ approach central venous catheter is stable in position.  Cardiac and mediastinal silhouettes are unchanged.  Widespread nodular  interstitial infiltrates are not significantly changed relative to prior study. Partial obscure a shin of the lateral aspect of left hemidiaphragm is stable. No large pleural effusion. No pneumothorax.  Osseous structures are unchanged.  IMPRESSION: 1. Tip of the endotracheal tube 1.7 cm above the carina. Remaining support apparatus in stable position. 2. No significant interval change in widespread reticulonodular infiltrates involving the lungs bilaterally, most compatible ARDS and possible pneumonia.   Electronically Signed   By: Jeannine Boga M.D.   On: 06/25/2014 06:46   Dg Chest Port 1 View  06/24/2014   CLINICAL DATA:  Congestive heart failure and sepsis  EXAM: PORTABLE CHEST - 1 VIEW  COMPARISON:  Portable chest x-ray of June 23, 2014 and June 20, 2014  FINDINGS: The lungs are well-expanded. There are widespread with nodular interstitial infiltrates which are fairly stable. No significant pleural effusion is demonstrated. There is partial obscuration of the lateral aspect of the left hemidiaphragm which is stable. The cardiac silhouette is normal. The pulmonary vascularity is not clearly engorged. Mediastinum is normal in width.  The endotracheal tube tip lies approximately 3 cm above the crotch of the carina. The esophagogastric tube tip and proximal port project below the level of the GE junction. The left internal jugular venous catheter tip lies at the junction of the left internal jugular vein with the left subclavian vein. The bony thorax is unremarkable.  IMPRESSION: There are persistent reticulonodular infiltrates not greatly changed from yesterday's exam consistent with diffuse pneumonia. The support tubes and lines are in appropriate position. There has been dramatic interval deterioration in the appearance of the chest since the admission study of June 20, 2014.   Electronically Signed   By: David  Martinique   On: 06/24/2014 07:19     ASSESSMENT /  PLAN:  PULMONARY OETT>>9/30>> A: Acute respiratory failure with ARDS in setting of septic shock, colitis and possible HCAP. Resp Acidosis- Rate on vent- 35, TV- 380, Fi02- 70% P:   ARDS protocol started 10/3 with worsening resp acidosis. Permissive hypercapnia as we are at rate of 35 Follow abg's May not tolerate low stretch  CARDIOVASCULAR CVL 9/30 LIJ>> Rt  femoral Aline 10/01 >> A:   Septic shock from colitis, HCAP, and Ecoli bacteremia. Hx HTN, Chronic diastolic heart failure (EF 60-65% 06/25/2014). NSTEMI with demand ischemia. On Pressors- levofed P:   Pressors to keep MAP > 60 Hold outpt anti-HTN meds Goal CVP 8-12  RENAL A:   AKI in setting of sepsis. CKD- At baseline Lactic acidosis. Hypocalcemia. Neurogenic bladder. Hypokalemia- 3.6 P:    Monitor BMP daily Hold diuresis with pressor needs,  renal insuff; consider start in next 48h Negative I/O without lasix 10/4 Will not replace KCl for now with tenous kidney function  GI/GU A:   Chronic Fecal Impaction. Ischemic colitis. P:    Manual disimpaction 3x/week TF at trickle 10 cc/hr PPI Surgery consulted, not a good surgical candidate, following- ABx for now   HEMATOLOGIC A:   Iron deficiency anemia - pt formerly on iron but stopped after she was told it improved Hgb- 8/6 Leukocytosis Thrombocytopenia (no heparin )106->87->75->94 P:   SCD's for DVT prevention F/u CBC  INFECTIOUS A:   Septic shock 2nd to colitis, HCAP. Chronic Lt lower extremity heel wound. MRSA positive. P:    BCx2 9/29 >>2/5 gram neg rod>>>ecoli BCx2 10/2>> NGTD Abx: vanc, start date 9/29>>> Abx: zosyn, start date 9/29>>>9/30 restarted 10/3>> Abx: Diflucan Started 9/30>>>10/1 Abx- levaquin started 9/30>>>10/3 Abx: imipenem started 10/1>>>10/3 (Limited supply in hospital)  ENDOCRINE A:   Hypothyroid   DM II R/o rel AI P:    Continue levothyroxine SSI Insulin on hold-hypogycemia TSH wnl Cortisol 36 therefore dc  steroids Add ssi with Tf start  NEUROLOGIC A:    Chronic pain Hx paraplegia s/p spinal fx Insomnia   P:    RASS goal: -2 with WUA Hold home baclofen, gabapentin, oxycodone   Avoiding benzo  10/2 change to full code per family request.  Interdisciplinary Family Meeting v Palliative Care Meeting: n/a  TODAY'S SUMMARY:  Remains on high flow O2. Started trickle tube feeds 10-3. Full code per family request. [place] worse on ARDS protocol. Agitated with any stimulation. Ecoli bacteremia identified and abx adjusted. Peognosis remains poor.  Reviewed above, examined.  She has ARDS, septic shock in setting of ischemic colitis, E coli Bacteremia, and presumed HCAP. Respiratory Acidosis.  Continue pressors, full vent support, Abx.  Conservative management of abdomen per surgery.  CC time 35 minutes.  Bing Neighbors PGY-2 IMTS  Baltazar Apo, MD, PhD 06/25/2014, 9:45 AM [facility] [phone] or if no answer [phone]

## 2014-06-26 ENCOUNTER — Inpatient Hospital Stay (HOSPITAL_COMMUNITY): Payer: Medicare Other

## 2014-06-26 LAB — POCT I-STAT 3, ART BLOOD GAS (G3+)
ACID-BASE DEFICIT: 2 mmol/L (ref 0.0–2.0)
Acid-base deficit: 4 mmol/L — ABNORMAL HIGH (ref 0.0–2.0)
BICARBONATE: 25.1 meq/L — AB (ref 20.0–24.0)
Bicarbonate: 22.6 mEq/L (ref 20.0–24.0)
O2 SAT: 78 %
O2 SAT: 70 %
PCO2 ART: 45.5 mmHg — AB (ref 35.0–45.0)
PO2 ART: 46 mmHg — AB (ref 80.0–100.0)
PO2 ART: 40 mmHg — AB (ref 80.0–100.0)
Patient temperature: 98
TCO2: 27 mmol/L (ref 0–100)
TCO2: 24 mmol/L (ref 0–100)
pCO2 arterial: 50.7 mmHg — ABNORMAL HIGH (ref 35.0–45.0)
pH, Arterial: 7.301 — ABNORMAL LOW (ref 7.350–7.450)
pH, Arterial: 7.302 — ABNORMAL LOW (ref 7.350–7.450)

## 2014-06-26 LAB — BASIC METABOLIC PANEL
ANION GAP: 12 (ref 5–15)
BUN: 32 mg/dL — ABNORMAL HIGH (ref 6–23)
CHLORIDE: 103 meq/L (ref 96–112)
CO2: 24 meq/L (ref 19–32)
CREATININE: 2.12 mg/dL — AB (ref 0.50–1.10)
Calcium: 8.5 mg/dL (ref 8.4–10.5)
GFR calc non Af Amer: 22 mL/min — ABNORMAL LOW (ref >90)
GFR, EST AFRICAN AMERICAN: 26 mL/min — AB (ref >90)
Glucose, Bld: 112 mg/dL — ABNORMAL HIGH (ref 70–99)
Potassium: 3.5 mEq/L — ABNORMAL LOW (ref 3.7–5.3)
SODIUM: 139 meq/L (ref 137–147)

## 2014-06-26 LAB — BLOOD GAS, ARTERIAL
Acid-base deficit: 2 mmol/L (ref 0.0–2.0)
Bicarbonate: 23.1 mEq/L (ref 20.0–24.0)
DRAWN BY: 31101
FIO2: 0.7 %
MECHVT: 380 mL
O2 Saturation: 98.9 %
PEEP/CPAP: 14 cmH2O
PO2 ART: 221 mmHg — AB (ref 80.0–100.0)
Patient temperature: 98.6
RATE: 35 resp/min
TCO2: 24.5 mmol/L (ref 0–100)
pCO2 arterial: 45.3 mmHg — ABNORMAL HIGH (ref 35.0–45.0)
pH, Arterial: 7.328 — ABNORMAL LOW (ref 7.350–7.450)

## 2014-06-26 LAB — CBC
HEMATOCRIT: 22 % — AB (ref 36.0–46.0)
Hemoglobin: 7.7 g/dL — ABNORMAL LOW (ref 12.0–15.0)
MCH: 22.8 pg — AB (ref 26.0–34.0)
MCHC: 35 g/dL (ref 30.0–36.0)
MCV: 65.1 fL — AB (ref 78.0–100.0)
Platelets: 106 10*3/uL — ABNORMAL LOW (ref 150–400)
RBC: 3.38 MIL/uL — ABNORMAL LOW (ref 3.87–5.11)
RDW: 21.6 % — ABNORMAL HIGH (ref 11.5–15.5)
WBC: 25.1 10*3/uL — AB (ref 4.0–10.5)

## 2014-06-26 LAB — HEPATIC FUNCTION PANEL
ALT: 9 U/L (ref 0–35)
AST: 25 U/L (ref 0–37)
Albumin: 1.5 g/dL — ABNORMAL LOW (ref 3.5–5.2)
Alkaline Phosphatase: 86 U/L (ref 39–117)
BILIRUBIN DIRECT: 2.3 mg/dL — AB (ref 0.0–0.3)
Indirect Bilirubin: 0.2 mg/dL — ABNORMAL LOW (ref 0.3–0.9)
TOTAL PROTEIN: 4.8 g/dL — AB (ref 6.0–8.3)
Total Bilirubin: 2.5 mg/dL — ABNORMAL HIGH (ref 0.3–1.2)

## 2014-06-26 LAB — GLUCOSE, CAPILLARY
GLUCOSE-CAPILLARY: 103 mg/dL — AB (ref 70–99)
GLUCOSE-CAPILLARY: 89 mg/dL (ref 70–99)
Glucose-Capillary: 119 mg/dL — ABNORMAL HIGH (ref 70–99)
Glucose-Capillary: 87 mg/dL (ref 70–99)
Glucose-Capillary: 98 mg/dL (ref 70–99)

## 2014-06-26 LAB — TRIGLYCERIDES: TRIGLYCERIDES: 288 mg/dL — AB (ref <150)

## 2014-06-26 MED ORDER — PHENYLEPHRINE HCL 10 MG/ML IJ SOLN
0.0000 ug/min | INTRAVENOUS | Status: DC
  Administered 2014-06-26: 300 ug/min via INTRAVENOUS
  Administered 2014-06-26: 175 ug/min via INTRAVENOUS
  Administered 2014-06-27 (×2): 300 ug/min via INTRAVENOUS
  Filled 2014-06-26 (×6): qty 4

## 2014-06-26 MED ORDER — MIDAZOLAM HCL 2 MG/2ML IJ SOLN
1.0000 mg | INTRAMUSCULAR | Status: DC | PRN
  Administered 2014-06-26 (×2): 2 mg via INTRAVENOUS
  Filled 2014-06-26 (×2): qty 2

## 2014-06-26 MED ORDER — AMIODARONE LOAD VIA INFUSION
150.0000 mg | Freq: Once | INTRAVENOUS | Status: DC
  Filled 2014-06-26: qty 83.34

## 2014-06-26 MED ORDER — POTASSIUM CHLORIDE 20 MEQ/15ML (10%) PO LIQD
40.0000 meq | Freq: Once | ORAL | Status: DC
  Filled 2014-06-26: qty 30

## 2014-06-26 MED ORDER — AMIODARONE HCL IN DEXTROSE 360-4.14 MG/200ML-% IV SOLN
INTRAVENOUS | Status: AC
  Filled 2014-06-26: qty 200

## 2014-06-26 MED ORDER — AMIODARONE HCL IN DEXTROSE 360-4.14 MG/200ML-% IV SOLN
60.0000 mg/h | INTRAVENOUS | Status: AC
  Administered 2014-06-26 (×2): 60 mg/h via INTRAVENOUS
  Filled 2014-06-26: qty 200

## 2014-06-26 MED ORDER — METOPROLOL TARTRATE 1 MG/ML IV SOLN: 5.0000 mg | Freq: Once | INTRAVENOUS | Status: DC

## 2014-06-26 MED ORDER — FUROSEMIDE 10 MG/ML IJ SOLN
20.0000 mg | Freq: Two times a day (BID) | INTRAMUSCULAR | Status: DC
  Administered 2014-06-26: 20 mg via INTRAVENOUS
  Filled 2014-06-26 (×2): qty 2

## 2014-06-26 MED ORDER — AMIODARONE HCL IN DEXTROSE 360-4.14 MG/200ML-% IV SOLN
30.0000 mg/h | INTRAVENOUS | Status: DC
  Administered 2014-06-26 – 2014-06-27 (×2): 30 mg/h via INTRAVENOUS
  Filled 2014-06-26 (×3): qty 200

## 2014-06-26 MED ORDER — PHENYLEPHRINE HCL 10 MG/ML IJ SOLN
30.0000 ug/min | INTRAVENOUS | Status: DC
  Administered 2014-06-26: 100 ug/min via INTRAVENOUS
  Filled 2014-06-26 (×2): qty 1

## 2014-06-26 MED ORDER — POTASSIUM CHLORIDE 20 MEQ/15ML (10%) PO LIQD
40.0000 meq | Freq: Two times a day (BID) | ORAL | Status: DC
  Administered 2014-06-26 (×2): 40 meq via ORAL
  Filled 2014-06-26 (×4): qty 30

## 2014-06-26 NOTE — Progress Notes (Signed)
ANTIBIOTIC CONSULT NOTE - Follow-up  Pharmacy Consult for Zosyn Indication: Ecoli bacteremia   No Known Allergies  Patient Measurements: Height: 4' 11.06" (150 cm) Weight: 199 lb 11.8 oz (90.6 kg) IBW/kg (Calculated) : 43.33   Vital Signs: Temp: 98 F (36.7 C) (10/06 0753) Temp Source: Oral (10/06 0753) BP: 117/50 mmHg (10/06 0900) Pulse Rate: 97 (10/06 0930) Intake/Output from previous day: 10/05 0701 - 10/06 0700 In: 1114.1 [I.V.:754.1; NG/GT:210; IV Piggyback:150] Out: 1325 [Urine:1325] Intake/Output from this shift: Total I/O In: 40 [I.V.:20; NG/GT:20] Out: 30 [Urine:30]  Labs:  Recent Labs  06/24/14 0507 06/25/14 0400 06/26/14 0326  WBC 20.5* 21.1*  --   HGB 7.3* 7.6*  --   PLT 75* 94*  --   CREATININE 2.31* 2.14* 2.12*   Estimated Creatinine Clearance: 24.2 ml/min (by C-G formula based on Cr of 2.12). No results found for this basename: VANCOTROUGH, VANCOPEAK, VANCORANDOM, [place], GENTPEAK, GENTRANDOM, [place], TOBRAPEAK, TOBRARND, AMIKACINPEAK, AMIKACINTROU, AMIKACIN,  in the last 72 hours   Microbiology: Recent Results (from the past 720 hour(s))  CULTURE, BLOOD (ROUTINE X 2)     Status: None   Collection Time    06/06/2014 12:30 PM      Result Value Ref Range Status   Specimen Description BLOOD RIGHT HAND   Final   Special Requests BOTTLES DRAWN AEROBIC AND ANAEROBIC 4MLS   Final   Culture  Setup Time     Final   Value: 06/09/2014 20:20     Performed at [facility]   Culture     Final   Value: ESCHERICHIA COLI     Note: Gram Stain Report Called to,Read Back By and Verified With: JAMES DUNLOP @ 1201 ON [id number] BY [facility]     Performed at [facility]   Report Status 06/23/2014 FINAL   Final   Organism ID, Bacteria ESCHERICHIA COLI   Final  URINE CULTURE     Status: None   Collection Time    06/12/2014 12:42 PM      Result Value Ref Range Status   Specimen Description URINE, CATHETERIZED   Final   Special Requests NONE   Final    Culture  Setup Time     Final   Value: 05/22/2014 18:11     Performed at [facility]     Final   Value: >=100,000 COLONIES/ML     Performed at [facility]   Culture     Final   Value: Multiple bacterial morphotypes present, none predominant. Suggest appropriate recollection if clinically indicated.     Performed at [facility]   Report Status 06/21/2014 FINAL   Final  MRSA PCR SCREENING     Status: Abnormal   Collection Time    06/16/2014  8:32 PM      Result Value Ref Range Status   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 It is not     intended to diagnose MRSA     infection nor to guide or     monitor treatment for     MRSA infections.     RESULT CALLED TO, READ BACK BY AND VERIFIED WITH:  J.[facility] 0003 06/21/14 M.CAMPBELL  CULTURE, BLOOD (ROUTINE X 2)     Status: None   Collection Time    05/22/2014 11:35 PM      Result Value Ref Range Status   Specimen Description BLOOD CENTRAL LINE   Final   Special Requests     Final   Value: BOTTLES DRAWN AEROBIC AND ANAEROBIC 10CC BLUE,8CC RED   Culture  Setup Time     Final   Value: 06/21/2014 09:12     Performed at [facility]   Culture     Final   Value:        BLOOD CULTURE RECEIVED NO GROWTH TO DATE CULTURE WILL BE HELD FOR 5 DAYS BEFORE ISSUING A FINAL NEGATIVE REPORT     Performed at [facility]   Report Status PENDING   Incomplete  CULTURE, BLOOD (ROUTINE X 2)     Status: None   Collection Time    06/22/14 10:05 AM      Result Value Ref Range Status   Specimen Description BLOOD RIGHT ANTECUBITAL   Final   Special Requests BOTTLES DRAWN AEROBIC ONLY 5CC   Final   Culture  Setup Time     Final   Value: 06/22/2014 14:39     Performed at [facility]   Culture     Final    Value:        BLOOD CULTURE RECEIVED NO GROWTH TO DATE CULTURE WILL BE HELD FOR 5 DAYS BEFORE ISSUING A FINAL NEGATIVE REPORT     Performed at [facility]   Report Status PENDING   Incomplete  CULTURE, BLOOD (ROUTINE X 2)     Status: None   Collection Time    06/22/14 10:08 AM      Result Value Ref Range Status   Specimen Description BLOOD RIGHT HAND   Final   Special Requests BOTTLES DRAWN AEROBIC ONLY 2CC   Final   Culture  Setup Time     Final   Value: 06/22/2014 14:39     Performed at [facility]   Culture     Final   Value:        BLOOD CULTURE RECEIVED NO GROWTH TO DATE CULTURE WILL BE HELD FOR 5 DAYS BEFORE ISSUING A FINAL NEGATIVE REPORT     Performed at [facility]   Report Status PENDING   Incomplete    Assessment: 68 y.o F on Zosyn Day #8 for Ecoli bacteremia. Vancomycin d/c today (s/p 8 days for HCAP coverage). Repeat blood cultures negative. WBC overall trend down. Afeb. SCr stable with estimated CrCl ~24 ml/min.  Goal of Therapy:  Resolution of infection  Plan:  1) Continue Zosyn 3.375 gm IV q8h (infuse over 4 hrs) 2) Will f/u length of therapy - planned 10-14 days 3) Will f/u micro data, pt's clinical condition, and renal function  Sherlon Handing, PharmD, BCPS Clinical pharmacist, pager [phone] 06/26/2014,9:53 AM

## 2014-06-26 NOTE — Progress Notes (Signed)
  Amiodarone Drug - Drug Interaction Consult Note  Recommendations:  Amiodarone is metabolized by the cytochrome P450 system and therefore has the potential to cause many drug interactions. Amiodarone has an average plasma half-life of 50 days (range 20 to 100 days).   There is potential for drug interactions to occur several weeks or months after stopping treatment and the onset of drug interactions may be slow after initiating amiodarone.   []  Statins: Increased risk of myopathy. Simvastatin- restrict dose to 20mg  daily. Other statins: counsel patients to report any muscle pain or weakness immediately.  []  Anticoagulants: Amiodarone can increase anticoagulant effect. Consider warfarin dose reduction. Patients should be monitored closely and the dose of anticoagulant altered accordingly, remembering that amiodarone levels take several weeks to stabilize.  []  Antiepileptics: Amiodarone can increase plasma concentration of phenytoin, the dose should be reduced. Note that small changes in phenytoin dose can result in large changes in levels. Monitor patient and counsel on signs of toxicity.  []  Beta blockers: increased risk of bradycardia, AV block and myocardial depression. Sotalol - avoid concomitant use.  []   Calcium channel blockers (diltiazem and verapamil): increased risk of bradycardia, AV block and myocardial depression.  []   Cyclosporine: Amiodarone increases levels of cyclosporine. Reduced dose of cyclosporine is recommended.  []  Digoxin dose should be halved when amiodarone is started.  []  Diuretics: increased risk of cardiotoxicity if hypokalemia occurs.  []  Oral hypoglycemic agents (glyburide, glipizide, glimepiride): increased risk of hypoglycemia. Patient's glucose levels should be monitored closely when initiating amiodarone therapy.   []  Drugs that prolong the QT interval:  Torsades de pointes risk may be increased with concurrent use - avoid if possible.  Monitor QTc, also  keep magnesium/potassium WNL if concurrent therapy can't be avoided. Marland Kitchen Antibiotics: e.g. fluoroquinolones, erythromycin. . Antiarrhythmics: e.g. quinidine, procainamide, disopyramide, sotalol. . Antipsychotics: e.g. phenothiazines, haloperidol.  . Lithium, tricyclic antidepressants, and methadone. Thank You,  Minta Balsam  06/26/2014 3:41 PM

## 2014-06-26 NOTE — Progress Notes (Signed)
Seen and agree  

## 2014-06-26 NOTE — Progress Notes (Addendum)
Arrived at bedside. Pt in rapid narrow complex irregular rhythm on 12 mcgs/min levophed. She was already sedated. Placed cardioversion pads then synchronized cardioverted at 120 Joules. She immediately converted to NSR, but w/in 1 minute developed frequent PVCs followed by was in and out of AF. Suspect levophed contributing.  Plan D/c levophed Start Neo Have started amiodarone gtt now that SBP in 90s. Suspect we can d/c amiodarone in am.    30 minutes of direct critical care at bedside  Marni Griffon ACNP-BC [place] Pager # [phone] OR # [phone] if no answer

## 2014-06-26 NOTE — Progress Notes (Signed)
While at the bedside, the pt went into SVT in the 150-160 range and became hypotensive with a systolic BP in the 76O.  Per Marni Griffon verbal order, pt was cardioverted at 120 joules and converted to NSR in the 90s.  Pt was started on amiodarone drip with no loading bolus, levophed was stopped and replaced with neosynephrine to maintain MAP >65.  I updated Jermaine, son, at bedside shortly after.  I was unable to contact Mrs. Ormiston husband, there was no answer on his phone.

## 2014-06-26 NOTE — Progress Notes (Addendum)
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   Initial complaints of AMS & low blood sugar.     STUDIES:   9/30 renal- port- Normal Uss appearance of kidneys, bladder decompressed with foley cath in place, no hydronephrosis. 9/30 ct chest - patchy infiltrates 9/30 ct abdo-colitis, free fluid  10/2 2 D: Echo ef 56%, grade 1 diastolic dysfunction.  SIGNIFICANT EVENTS: 9/30  Brought to [facility] by EMS for AMS and hypoglycemia, work up consistent with septic shock   9/30 Intubated >> 10/1- refractory shock 10/1- surgery consulted 10/3 full code, ARDS protocol 10/4 on 90% fio2  SUBJECTIVE: Off sedatives, some response to commands, On vent. Family not present.   VITAL SIGNS: Temp:  [97.3 F (36.3 C)-98.4 F (36.9 C)] 98 F (36.7 C) (10/06 0753) Pulse Rate:  [60-101] 75 (10/06 0500) Resp:  [11-35] 11 (10/06 0700) BP: (73-151)/(39-66) 118/51 mmHg (10/06 0700) SpO2:  [97 %-100 %] 99 % (10/06 0500) Arterial Line BP: (84-172)/(29-68) 117/47 mmHg (10/06 0700) FiO2 (%):  [50 %-70 %] 50 % (10/06 0413) Weight:  [199 lb 11.8 oz (90.6 kg)] 199 lb 11.8 oz (90.6 kg) (10/06 0329)  HEMODYNAMICS: CVP:  [11 mmHg-12 mmHg] 12 mmHg  VENT SETTINGS:  Vent Mode:  [-] PRVC FiO2 (%):  [50 %-70 %] 50 % Set Rate:  [35 bmp] 35 bmp Vt Set:  [380 mL] 380 mL PEEP:  [10 cmH20-14 cmH20] 10 cmH20 Plateau Pressure:  [26 cmH20-35 cmH20] 34 cmH20  INTAKE / OUTPUT:  Intake/Output Summary (Last 24 hours) at 06/26/14 0802 Last data filed at 06/26/14 0720  Gross per 24 hour  Intake 1047.49 ml  Output   1255 ml  Net -207.51 ml   PHYSICAL EXAMINATION: General:  WUA-  eyes open, sometimes responded to commands, mild- mod agitation, sats dropped to 84%.   Neuro:  Mild agitation off fentanyl and propofol. HEENT:  NCAT, pupils constricted, but equal.  Cardiovascular:  s1 s2 regular, no m/r/g Lungs:  Coarse, mild crackles bilat- basilar Abdomen:  Soft, NT, distended mild, no r/g. No bowel sounds. TF at 10   ml/hr Musculoskeletal:  S/p R AKA, pulses weak LLE, dark, cold feet. Skin:  Heel ulcer L, no necrotic tissue, red/pink granulation tissue surrounded by min maceration, quarter in size . Left foot cool and mottled.  Rt AKA noted  LABS: CBC Recent Labs     06/24/14  0507  06/25/14  0400  WBC  20.5*  21.1*  HGB  7.3*  7.6*  HCT  21.5*  22.3*  PLT  75*  94*    BMET Recent Labs     06/24/14  0507  06/25/14  0400  06/26/14  0326  NA  132*  135*  139  K  4.0  3.6*  3.5*  CL  99  101  103  CO2  24  23  24   BUN  30*  32*  32*  CREATININE  2.31*  2.14*  2.12*  GLUCOSE  116*  115*  112*    Electrolytes Recent Labs     06/24/14  0507  06/25/14  0400  06/26/14  0326  CALCIUM  8.2*  8.5  8.5  MG  2.0  2.0   --   PHOS  3.7  3.5   --     ABG Recent Labs     06/24/14  1501  06/25/14  0339  06/26/14  0353  PHART  7.278*  7.299*  7.328*  PCO2ART  50.1*  48.6*  45.3*  PO2ART  144.0*  105.0*  221.0*    Liver Enzymes Recent Labs     06/26/14  0326  AST  25  ALT  9  ALKPHOS  86  BILITOT  2.5*  ALBUMIN  1.5*    Glucose Recent Labs     06/25/14  0823  06/25/14  1154  06/25/14  1708  06/25/14  1953  06/25/14  2347  06/26/14  0444  GLUCAP  99  99  96  103*  119*  103*    Imaging Dg Chest Port 1 View  06/26/2014   CLINICAL DATA:  Intubation.  EXAM: PORTABLE CHEST - 1 VIEW  COMPARISON:  06/25/2014.  FINDINGS: Endotracheal tube, NG tube, left IJ line stent position. Diffuse pulmonary infiltrates are present bilaterally. These are unchanged. Heart size and pulmonary vascularity stable. No pleural effusion or pneumothorax. No  acute bony abnormality.  IMPRESSION: 1. Support apparatus stable. 2. Diffuse bilateral pulmonary infiltrates, unchanged. Again ARDS and pneumonia could present in this fashion.   Electronically Signed   By: Marcello Moores  Register   On: 06/26/2014 07:21   Dg Chest Port 1 View  06/25/2014   CLINICAL DATA:  Followup.  Evaluate endotracheal tube.  EXAM: PORTABLE CHEST - 1 VIEW  COMPARISON:  Prior radiograph from 06/24/2014  FINDINGS: Tip of the endotracheal tube is positioned 1.7 cm above the carina. Enteric tube courses into the abdomen with nonvisualization of the tip. Left IJ approach central venous catheter is stable in position.  Cardiac and mediastinal silhouettes are unchanged.  Widespread nodular interstitial infiltrates are not significantly changed relative to prior study. Partial obscure a shin of the lateral aspect of left hemidiaphragm is stable. No large pleural effusion. No pneumothorax.  Osseous structures are unchanged.  IMPRESSION: 1. Tip of the endotracheal tube 1.7 cm above the carina. Remaining support apparatus in stable position. 2. No significant interval change in widespread reticulonodular infiltrates involving the lungs bilaterally, most compatible ARDS and possible pneumonia.   Electronically Signed   By: Jeannine Boga M.D.   On: 06/25/2014 06:46     ASSESSMENT / PLAN:  PULMONARY OETT>>9/30>> A: Acute respiratory failure with ARDS in setting of septic shock, colitis and possible HCAP. Resp Acidosis- Improving, ABG- 06/26/2014- Ph- 7.3, Pco2- 50.7, PO2- 46. P:   ARDS protocol started 10/3 with worsening resp acidosis. Permissive hypercapnia as we are at rate of 35 Follow abg's Favor starting low dose diuretics 10/6 to drive CVP down   CARDIOVASCULAR CVL 9/30 [facility]>> Rt femoral Aline 10/01 >> A:   Septic shock from colitis, HCAP, and Ecoli bacteremia. Hx HTN, Chronic diastolic heart failure (EF 60-65% 06/25/2014). NSTEMI with demand ischemia. P:   Pressors to keep MAP  > 60 Hold anti-HTN meds Goal CVP 5-7  RENAL A:   AKI in setting of sepsis. CKD- presently at baseline Lactic acidosis- resolved Hypocalcemia. Neurogenic bladder. Hypokalemia- 3.5 P:    Monitor BMP daily Hold diuresis with pressor needs,  renal insuff; consider start in next 48h Negative I/O without lasix 10/6- 26meq of oral KCL, per  tube now and pm 10/6  GI/GU A:   Chronic Fecal Impaction  Ischemic colitis. P:    Manual disimpaction 3x/week, disimpaction yesterday- No stool in vault, Enema today. TF at trickle 10 cc/hr; will discuss w CCS to see if we can slowly increase 10/6 PPI Surgery consulted, not a good surgical candidate, following- ABx for now   HEMATOLOGIC A:   Iron deficiency anemia - pt formerly on iron but stopped after she was told it improved Hgb- 8/6 Leukocytosis Thrombocytopenia (no heparin )106->87->75->94 P:   SCD's for DVT prevention F/u CBC  INFECTIOUS A:   Septic shock 2nd to colitis, HCAP. Chronic Lt lower extremity heel wound. MRSA positive. Ecoli Bacteremia.  P:    BCx2 9/29 >>2/5 gram neg rod>>>ecoli BCx2 10/2>> NGTD Abx: vanc, start date 9/29>>> 10/6 Abx: zosyn, start date 9/29>>>9/30 restarted 10/3>> Abx: Diflucan Started 9/30>>>10/1 Abx- levaquin started 9/30>>>10/3 Abx: imipenem started 10/1>>>10/3 (Limited supply in hospital) D/c Vanco on 10/6 Repeat Blood cultures- 10/2 NGTD D/c art line 10/6  ENDOCRINE A:   Hypothyroid   DM II R/o rel AI P:    Continue levothyroxine SSI Insulin on hold-hypogycemia TSH wnl Add ssi with Tf start  NEUROLOGIC A:    Chronic pain Hx paraplegia s/p spinal fx Insomnia   P:    RASS goal: -2 with WUA Hold home baclofen, gabapentin, oxycodone   Avoiding benzo On fentanyl, propofol ( TG- 288- check Q48H).  10/2 change to full code per family request.  Interdisciplinary Family Meeting v Palliative Care Meeting: n/a  TODAY'S SUMMARY:  Remains on high flow O2. Started trickle tube  feeds 10-3. Full code per family request. [place] worse on ARDS protocol. Agitated with any stimulation. Ecoli bacteremia identified and abx adjusted. Prognosis remains poor.  Reviewed above, examined.  She has ARDS, septic shock in setting of ischemic colitis, E coli Bacteremia, and presumed HCAP. Respiratory Acidosis.  Continue pressors, full vent support, Abx.  Conservative management of abdomen per surgery.  CC time 35 minutes.  Bing Neighbors PGY-2 IMTS  Baltazar Apo, MD, PhD 06/26/2014, 8:02 AM [facility] [phone] or if no answer [phone]

## 2014-06-26 NOTE — Progress Notes (Signed)
[facility] Progress Note     Subjective: Daughter and son at bedside.  WBC improving overall, pending today.  Afebrile.  Still intubated on vent.  On Levofed.  Objective: Vital signs in last 24 hours: Temp:  [97.3 F (36.3 C)-98.4 F (36.9 C)] 98 F (36.7 C) (10/06 0753) Pulse Rate:  [60-101] 87 (10/06 0753) Resp:  [11-35] 32 (10/06 0830) BP: (73-151)/(39-63) 118/51 mmHg (10/06 0753) SpO2:  [97 %-100 %] 100 % (10/06 0753) Arterial Line BP: (84-172)/(29-68) 97/43 mmHg (10/06 0830) FiO2 (%):  [40 %-70 %] 40 % (10/06 0753) Weight:  [199 lb 11.8 oz (90.6 kg)] 199 lb 11.8 oz (90.6 kg) (10/06 0329) Last BM Date: 06/19/2014  Intake/Output from previous day: 10/05 0701 - 10/06 0700 In: 1114.1 [I.V.:754.1; NG/GT:210; IV Piggyback:150] Out: 1325 [Urine:1325] Intake/Output this shift: Total I/O In: 40 [I.V.:20; NG/GT:20] Out: 30 [Urine:30]  PE: Gen:  Intubated on vent/pressors Card:  Tachy rate, nl rhythm, no M/G/R heard Pulm:  Course breath sounds b/l Abd: Soft, distended, NT, +BS, no HSM Ext:  Right AKA, left LE (heal with pressure sores)  Lab Results:   Recent Labs  06/24/14 0507 06/25/14 0400  WBC 20.5* 21.1*  HGB 7.3* 7.6*  HCT 21.5* 22.3*  PLT 75* 94*   BMET  Recent Labs  06/25/14 0400 06/26/14 0326  NA 135* 139  K 3.6* 3.5*  CL 101 103  CO2 23 24  GLUCOSE 115* 112*  BUN 32* 32*  CREATININE 2.14* 2.12*  CALCIUM 8.5 8.5   PT/INR No results found for this basename: LABPROT, INR,  in the last 72 hours CMP     Component Value Date/Time   NA 139 06/26/2014 0326   K 3.5* 06/26/2014 0326   CL 103 06/26/2014 0326   CO2 24 06/26/2014 0326   GLUCOSE 112* 06/26/2014 0326   BUN 32* 06/26/2014 0326   CREATININE 2.12* 06/26/2014 0326   CALCIUM 8.5 06/26/2014 0326   PROT 4.8* 06/26/2014 0326   ALBUMIN 1.5* 06/26/2014 0326   AST 25 06/26/2014 0326   ALT 9 06/26/2014 0326   ALKPHOS 86 06/26/2014 0326   BILITOT 2.5* 06/26/2014 0326   GFRNONAA 22* 06/26/2014  0326   GFRAA 26* 06/26/2014 0326   Lipase     Component Value Date/Time   LIPASE 6* 06/14/2014 2100       Studies/Results: Dg Chest Port 1 View  06/26/2014   CLINICAL DATA:  Intubation.  EXAM: PORTABLE CHEST - 1 VIEW  COMPARISON:  06/25/2014.  FINDINGS: Endotracheal tube, NG tube, left IJ line stent position. Diffuse pulmonary infiltrates are present bilaterally. These are unchanged. Heart size and pulmonary vascularity stable. No pleural effusion or pneumothorax. No acute bony abnormality.  IMPRESSION: 1. Support apparatus stable. 2. Diffuse bilateral pulmonary infiltrates, unchanged. Again ARDS and pneumonia could present in this fashion.   Electronically Signed   By: Marcello Moores  Register   On: 06/26/2014 07:21   Dg Chest Port 1 View  06/25/2014   CLINICAL DATA:  Followup.  Evaluate endotracheal tube.  EXAM: PORTABLE CHEST - 1 VIEW  COMPARISON:  Prior radiograph from 06/24/2014  FINDINGS: Tip of the endotracheal tube is positioned 1.7 cm above the carina. Enteric tube courses into the abdomen with nonvisualization of the tip. Left IJ approach central venous catheter is stable in position.  Cardiac and mediastinal silhouettes are unchanged.  Widespread nodular interstitial infiltrates are not significantly changed relative to prior study. Partial obscure a shin of the lateral aspect of left hemidiaphragm  is stable. No large pleural effusion. No pneumothorax.  Osseous structures are unchanged.  IMPRESSION: 1. Tip of the endotracheal tube 1.7 cm above the carina. Remaining support apparatus in stable position. 2. No significant interval change in widespread reticulonodular infiltrates involving the lungs bilaterally, most compatible ARDS and possible pneumonia.   Electronically Signed   By: Jeannine Boga M.D.   On: 06/25/2014 06:46    Anti-infectives: Anti-infectives   Start     Dose/Rate Route Frequency Ordered Stop   06/24/14 1300  vancomycin (VANCOCIN) IVPB 1000 mg/200 mL premix     1,000  mg 200 mL/hr over 60 Minutes Intravenous Every 48 hours 06/23/14 1718     06/23/14 1800  piperacillin-tazobactam (ZOSYN) IVPB 3.375 g     3.375 g 12.5 mL/hr over 240 Minutes Intravenous Every 8 hours 06/23/14 1718     06/22/14 2030  levofloxacin (LEVAQUIN) IVPB 500 mg  Status:  Discontinued     500 mg 100 mL/hr over 60 Minutes Intravenous Every 48 hours 05/24/2014 2011 06/23/14 1702   06/22/14 1300  vancomycin (VANCOCIN) IVPB 1000 mg/200 mL premix  Status:  Discontinued     1,000 mg 200 mL/hr over 60 Minutes Intravenous Every 48 hours 06/18/2014 1324 06/23/14 1615   06/21/14 2000  fluconazole (DIFLUCAN) IVPB 400 mg  Status:  Discontinued     400 mg 100 mL/hr over 120 Minutes Intravenous Every 24 hours 06/13/2014 1914 06/21/14 0955   06/21/14 2000  fluconazole (DIFLUCAN) IVPB 200 mg  Status:  Discontinued     200 mg 100 mL/hr over 60 Minutes Intravenous Every 24 hours 06/21/14 0955 06/21/14 1015   06/21/14 1245  imipenem-cilastatin (PRIMAXIN) 250 mg in sodium chloride 0.9 % 100 mL IVPB  Status:  Discontinued     250 mg 200 mL/hr over 30 Minutes Intravenous Every 12 hours 06/21/14 1230 06/23/14 1615   06/13/2014 2100  piperacillin-tazobactam (ZOSYN) IVPB 3.375 g  Status:  Discontinued     3.375 g 12.5 mL/hr over 240 Minutes Intravenous Every 8 hours 06/01/2014 2051 06/21/14 1230   05/30/2014 2030  levofloxacin (LEVAQUIN) IVPB 750 mg     750 mg 100 mL/hr over 90 Minutes Intravenous  Once 06/15/2014 2011 06/09/2014 2327   05/29/2014 2000  piperacillin-tazobactam (ZOSYN) IVPB 2.25 g  Status:  Discontinued     2.25 g 100 mL/hr over 30 Minutes Intravenous Every 6 hours 06/02/2014 1324 06/19/2014 2051   06/16/2014 1915  fluconazole (DIFLUCAN) IVPB 800 mg     800 mg 200 mL/hr over 120 Minutes Intravenous  Once 06/19/2014 1914 05/26/2014 2218   06/10/2014 1230  piperacillin-tazobactam (ZOSYN) IVPB 3.375 g     3.375 g 100 mL/hr over 30 Minutes Intravenous  Once 06/11/2014 1221 06/18/2014 1305   06/19/2014 1230  vancomycin  (VANCOCIN) IVPB 1000 mg/200 mL premix     1,000 mg 200 mL/hr over 60 Minutes Intravenous  Once 06/11/2014 1221 05/26/2014 1418      BCx2 9/29 >>2/5 gram neg rod>>>ecoli  BCx2 10/2>> NGTD  Abx: vanc, start date 9/29>>>  Abx: zosyn, start date 9/29>>>9/30 restarted 10/3>>  Abx: Diflucan Started 9/30>>>10/1  Abx- levaquin started 9/30>>>10/3  Abx: imipenem started 10/1>>>10/3 (Limited supply in hospital)   Assessment/Plan  Colitis, ischemic Septic shock  E. Coli bacteremia Leukocytosis - improved to 21.1 yesterday  PCM on TPN  NSTEMI/demand ischemia  Acute on CRF  Chronic Fecal Impaction  Paraplegia s/p spinal fx  Neurogenic bladder   Plan:  1. Intubated/sedated, wean pressors if possible, making  slow improvement 2. This explosive diarrhea may have been a sign of some ongoing low-grade colonic ischemia which worsened with her current illness. She has severe abdominal vascular atherosclerotic disease.  3. Agree with supportive care and ongoing resuscitation.  4. No current surgical plans since she appears to be improving slowly.  Poor surgical candidate.  Would have a hard time healing from surgery given her chronic vascular disease 5. Continue antibiotics (as above) 6. Has Vital high protein ordered 40ml/hr since 06/22/14, okay to increase to 74mL/hr as a trial for today.  Do not advance past this, check residuals 7.  Getting manual disimpaction 3x/wkfor chronic fecal impaction, enemas, last BM 05/22/2014 8.  Discussed with son and daughter at bedside    LOS: 6 days    Coralie Keens 06/26/2014, 9:32 AM Pager: [phone]

## 2014-06-27 LAB — GLUCOSE, CAPILLARY
GLUCOSE-CAPILLARY: 155 mg/dL — AB (ref 70–99)
GLUCOSE-CAPILLARY: 65 mg/dL — AB (ref 70–99)
Glucose-Capillary: 159 mg/dL — ABNORMAL HIGH (ref 70–99)
Glucose-Capillary: 62 mg/dL — ABNORMAL LOW (ref 70–99)
Glucose-Capillary: 65 mg/dL — ABNORMAL LOW (ref 70–99)
Glucose-Capillary: 67 mg/dL — ABNORMAL LOW (ref 70–99)

## 2014-06-27 LAB — CBC
HEMATOCRIT: 22.3 % — AB (ref 36.0–46.0)
HEMOGLOBIN: 7.3 g/dL — AB (ref 12.0–15.0)
MCH: 22.2 pg — ABNORMAL LOW (ref 26.0–34.0)
MCHC: 32.7 g/dL (ref 30.0–36.0)
MCV: 67.8 fL — AB (ref 78.0–100.0)
Platelets: 68 10*3/uL — ABNORMAL LOW (ref 150–400)
RBC: 3.29 MIL/uL — AB (ref 3.87–5.11)
RDW: 23.6 % — AB (ref 11.5–15.5)
WBC: 29.7 10*3/uL — AB (ref 4.0–10.5)

## 2014-06-27 LAB — BASIC METABOLIC PANEL
ANION GAP: 28 — AB (ref 5–15)
BUN: 35 mg/dL — ABNORMAL HIGH (ref 6–23)
CALCIUM: 8 mg/dL — AB (ref 8.4–10.5)
CHLORIDE: 95 meq/L — AB (ref 96–112)
CO2: 11 mEq/L — ABNORMAL LOW (ref 19–32)
Creatinine, Ser: 2.65 mg/dL — ABNORMAL HIGH (ref 0.50–1.10)
GFR calc Af Amer: 20 mL/min — ABNORMAL LOW (ref >90)
GFR calc non Af Amer: 17 mL/min — ABNORMAL LOW (ref >90)
GLUCOSE: 63 mg/dL — AB (ref 70–99)
Potassium: 5.3 mEq/L (ref 3.7–5.3)
SODIUM: 134 meq/L — AB (ref 137–147)

## 2014-06-27 LAB — CULTURE, BLOOD (ROUTINE X 2): Culture: NO GROWTH

## 2014-06-27 MED ORDER — DEXTROSE 50 % IV SOLN
INTRAVENOUS | Status: AC
  Administered 2014-06-27: 25 mL
  Filled 2014-06-27: qty 50

## 2014-06-27 MED ORDER — VASOPRESSIN 20 UNIT/ML IJ SOLN
0.0300 [IU]/min | INTRAVENOUS | Status: DC
  Administered 2014-06-27: 0.03 [IU]/min via INTRAVENOUS
  Filled 2014-06-27: qty 2

## 2014-06-28 LAB — CULTURE, BLOOD (ROUTINE X 2)
Culture: NO GROWTH
Culture: NO GROWTH

## 2014-06-28 MED FILL — Medication: Qty: 1 | Status: AC

## 2014-07-22 NOTE — Progress Notes (Signed)
Pt previously on Fentanyl drip 10 mcg/ml; 40 ml of 250 ml bag wasted in sink; witnessed by Seymour Bars, RN.  Lenor Coffin, RN

## 2014-07-22 NOTE — Progress Notes (Signed)
CODE BLUE NOTE  Patient Name: Alexandra Wiley   MRN: [id number]   Date of Birth/ Sex: [birth year] , female      Admission Date: 06/17/2014  Attending Provider: [name], MD  Primary Diagnosis: UTI (lower urinary tract infection) [N39.0] Encephalopathy [G93.40] Septic shock [A41.9, R65.21] Hypotension, unspecified hypotension type [I95.9]    Indication: Pt was in her usual state of health until this AM, when she was noted to be bradycardic without a pulse. Code blue was subsequently called. At the time of arrival on scene, ACLS protocol was underway.   Technical Description:  - CPR performance duration:  13  minutes  - Was defibrillation or cardioversion used? No   - Was external pacer placed? No  - Was patient intubated pre/post CPR? Yes    Medications Administered: Y = Yes; Blank = No Amiodarone    Atropine    Calcium    Epinephrine  Y  Lidocaine    Magnesium    Norepinephrine    Phenylephrine    Sodium bicarbonate    Vasopressin    Other     Post CPR evaluation:  - Final Status - Was patient successfully resuscitated ? No   Miscellaneous Information:  - Time of death:  0623  AM  - Primary team notified?  Yes  - Family Notified? Yes        Arman Filter, MD   July 12, 2014, 6:38 AM

## 2014-07-22 NOTE — Progress Notes (Signed)
Death Note: Code blue called and CPR performed and drugs given. Pt. Asystole on monitor. No breath on heart sound auscultated. Pupils fixed and dilated. Verified by second nurse at bedside. Time of death called at [phone] by MD present at bedside. Family notified. [place] [facility] contacted.

## 2014-07-22 NOTE — Progress Notes (Signed)
Chaplain responded to Code Blue, pt was actively dying upon arrival.  RN notified family of pt's condition.  Pt pronounced dead shortly thereafter.  Chaplain checked in with members of the team to offer emotional support.  Chaplain will follow up as needed or when family arrives.  2014-07-13 0600  Clinical Encounter Type  Visited With Patient;Health care provider  Visit Type Initial;Code;Critical Care;Death;Patient actively dying  Referral From Care management  Advance Directives (For Healthcare)  Does patient have an advance directive? No  Mertie Moores, Chaplain

## 2014-07-22 NOTE — Progress Notes (Signed)
Pt to morgue; no family at bedside; pt's dentures placed in morgue bag.  Lenor Coffin, RN

## 2014-07-22 NOTE — Progress Notes (Signed)
MD notified of Rhythm change. EKG obtained

## 2014-07-22 DEATH — deceased

## 2014-07-30 NOTE — Discharge Summary (Signed)
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 Initial complaints of AMS & low blood sugar.    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 She presented to ED 05-Jul-2014 with approximately 48 hours of lightheadedness, hypoglycemia, drowsiness and hypoglycemia (blood sugars ranging from 70's-170's at home). She was admitted with shock, presumed sepsis. Source was not entirely clear but evidence of colitis was seen on CT abdomen and she was noted to have Escherichia coli UTI and ultimately bacteremia. She developed bilateral pulmonary infiltrates consistent with either ARDS or healthcare associated pneumonia. She required intubation and ventilation on 07-05-14. Her antibiotics were expanded to cover HCAP. She was treated with lung protective low tidal volume ventilation. Pressors were initiated for septic shock and she was aggressively  volume resuscitated. Her course was complicated by stress non-STEMI, acute renal failure, lactic acidosis. She was evaluated by general surgery given her evidence of ischemic changes on CT abdomen. She was deemed to be a poor candidate and to unstable for surgical intervention. She was treated aggressively with IV antibiotics, pressors, mechanical ventilation. On 06/26/14 she developed a rapid narrow complex irregular rhythm while on Levaquin. She was hemodynamically unstable and was cardioverted to sinus rhythm and then quickly converted to atrial fibrillation. Her norepinephrine was changed to phenylephrine. Unfortunately she sustained a recurrent similar event the next morning that devolved to bradycardia and PEA. ACLS was initiated and CPR was performed for 13 minutes. Unfortunately the patient did not recover a pulse and was declared dead on 07-12-2014.  STUDIES:  2023/07/06 renal- port- Normal Uss appearance of kidneys, bladder decompressed with foley cath in place, no hydronephrosis. 07-06-2023 ct chest - patchy infiltrates July 06, 2023 ct abdo-colitis, free fluid  10/2 2 D: Echo ef 86%, grade 1 diastolic dysfunction.  SIGNIFICANT EVENTS: July 06, 2023 Brought to [facility] by EMS for AMS and hypoglycemia, work up consistent with septic shock  2023-07-06 Intubated >> 10/1- refractory shock 10/1- surgery consulted 10/3 full code, ARDS protocol 10/4 on 90% fio2   Baltazar Apo, MD, PhD 07/30/2014, 3:58 PM [facility] [phone] or if no answer [phone]

## 2014-07-30 NOTE — Progress Notes (Signed)
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   Initial complaints of AMS & low blood sugar.      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 She presented to ED 27-Jun-2014 with approximately 48 hours of lightheadedness, hypoglycemia, drowsiness and hypoglycemia (blood sugars ranging from 70's-170's at home). She was admitted with shock, presumed sepsis.  Source was not entirely clear but evidence of colitis was seen on CT abdomen and she was noted to have Escherichia coli UTI and ultimately bacteremia. She developed bilateral pulmonary infiltrates consistent with either ARDS or healthcare associated pneumonia. She required intubation and ventilation on Jun 27, 2014. Her antibiotics were expanded to cover HCAP. She was treated with lung protective low tidal volume ventilation. Pressors were initiated for septic shock and she was aggressively  volume resuscitated. Her course was complicated by stress non-STEMI, acute renal failure, lactic acidosis. She was evaluated by general surgery given her evidence of ischemic changes on CT abdomen. She was deemed to be a poor candidate and to unstable for surgical intervention. She was treated aggressively with IV antibiotics, pressors, mechanical ventilation. On 06/26/14 she developed a rapid narrow complex irregular rhythm while on Levaquin. She was hemodynamically unstable and was cardioverted to sinus rhythm and then quickly converted to atrial fibrillation. Her norepinephrine was changed to phenylephrine. Unfortunately she sustained a recurrent similar event the next morning that devolved to bradycardia and PEA. ACLS was initiated and CPR was performed for 13 minutes. Unfortunately the patient did not recover a pulse and was declared dead on 07/04/14.  STUDIES:   June 28, 2023 renal- port- Normal Uss appearance of kidneys, bladder decompressed with foley cath in place, no hydronephrosis. June 28, 2023 ct chest - patchy infiltrates 28-Jun-2023 ct abdo-colitis, free fluid  10/2 2 D: Echo ef 02%, grade 1 diastolic dysfunction.  SIGNIFICANT EVENTS: Jun 28, 2023  Brought to [facility] by EMS for AMS and hypoglycemia, work up consistent with septic shock   06-28-23 Intubated >> 10/1- refractory shock 10/1- surgery consulted 10/3 full code, ARDS protocol 10/4 on 90% fio2   Baltazar Apo, MD, PhD 07/30/2014, 3:58 PM [facility] [phone] or if no answer [phone]

## 2014-08-30 ENCOUNTER — Encounter (HOSPITAL_COMMUNITY): Payer: Self-pay | Admitting: Vascular Surgery

## 2014-10-21 IMAGING — CR DG ABD PORTABLE 1V
1 series · 1 of 1 positions shown · non-contrast
Comparison: None.

CLINICAL DATA: Assess ET and OG tube.

EXAM:
PORTABLE ABDOMEN - 1 VIEW

[AP]
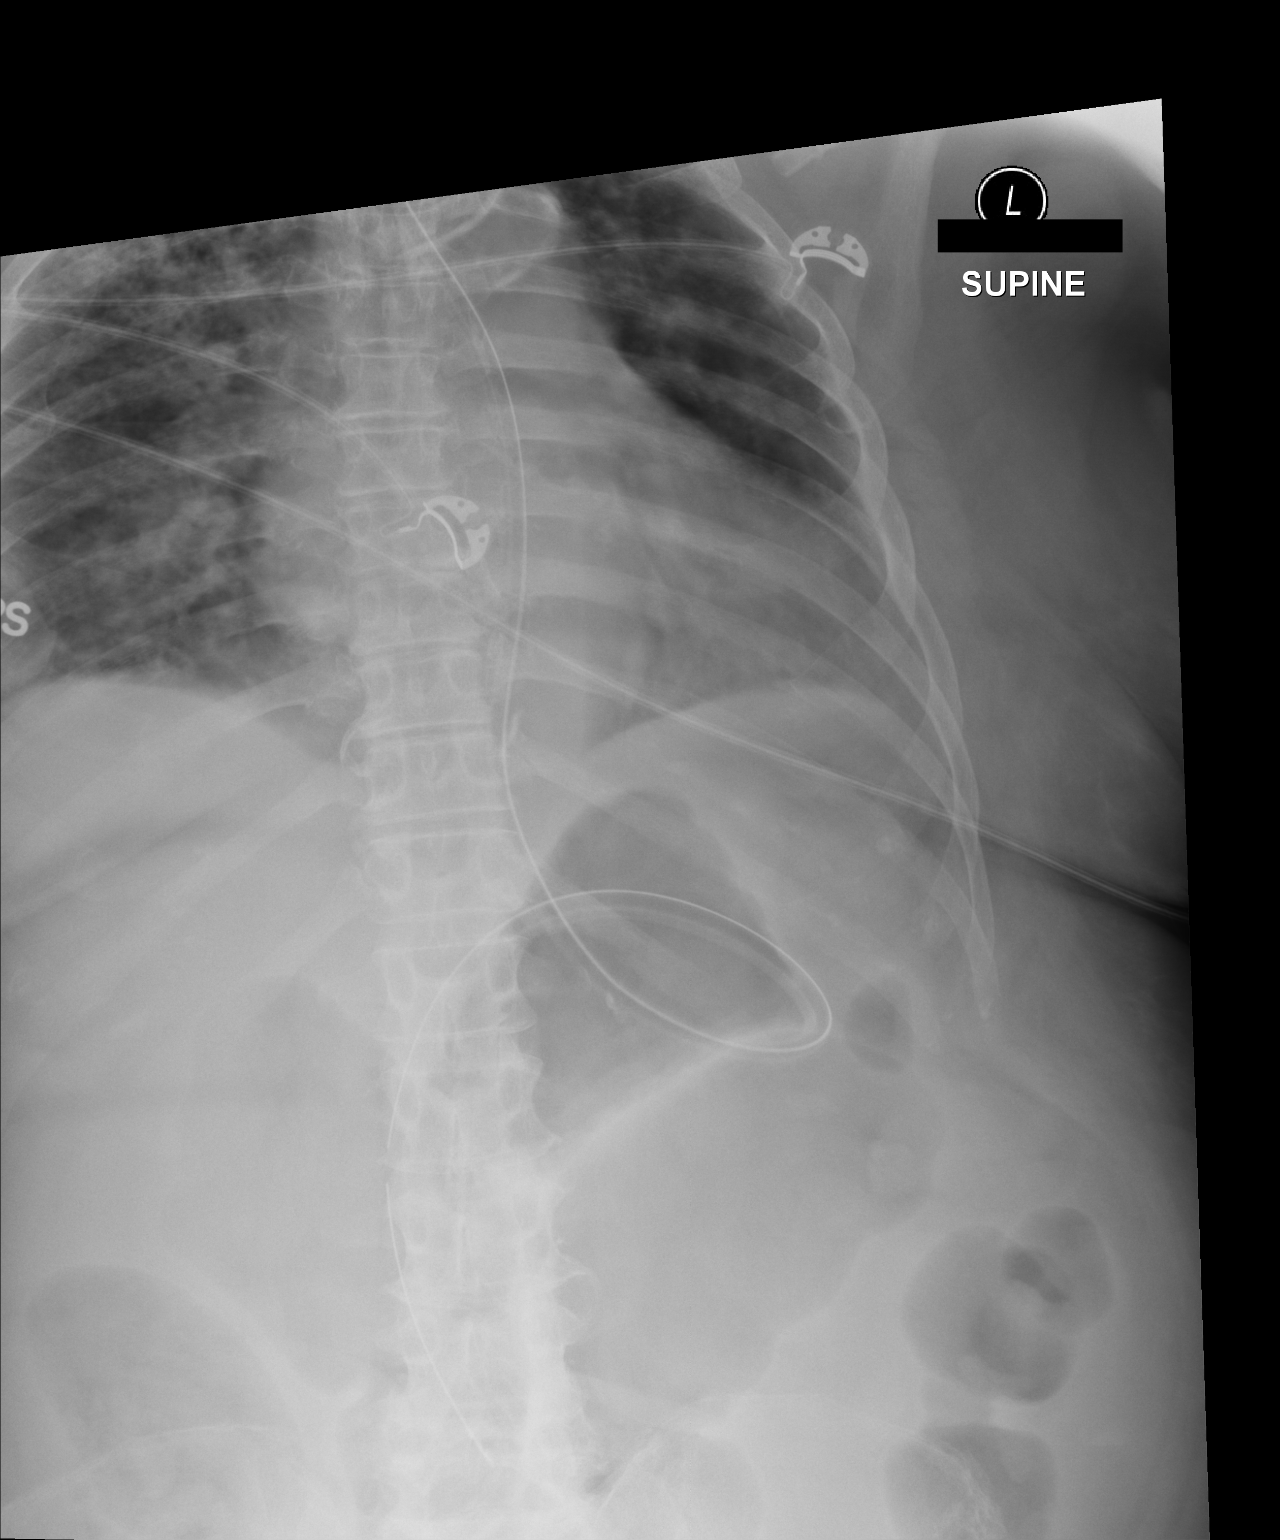

[1 of 1 positions shown; findings below may reference images not displayed]

FINDINGS: Enteric tube tip is in the lower abdomen centrally, consistent with
location in the distal stomach or proximal duodenum.
IMPRESSION: Enteric tube tip localizes over the mid abdomen consistent with
location in the distal stomach or proximal duodenum.

## 2014-10-21 IMAGING — CT CT CHEST W/O CM
2 of 4 series · 15 of 36 positions shown, 18 images · non-contrast
Comparison: 02/08/2012

CLINICAL DATA: Suspected abdominal sepsis.

EXAM:
CT CHEST, ABDOMEN AND PELVIS WITHOUT CONTRAST
TECHNIQUE: Multidetector CT imaging of the chest, abdomen and pelvis was
performed following the standard protocol without IV contrast.

[Series 2: cap wo 5.0 i31f 1 · axial · 0.78mm/px · z∈[+1008,+1533]mm · 12 of 119 slices shown, 15 images]
[im 7/119  mediastinal]
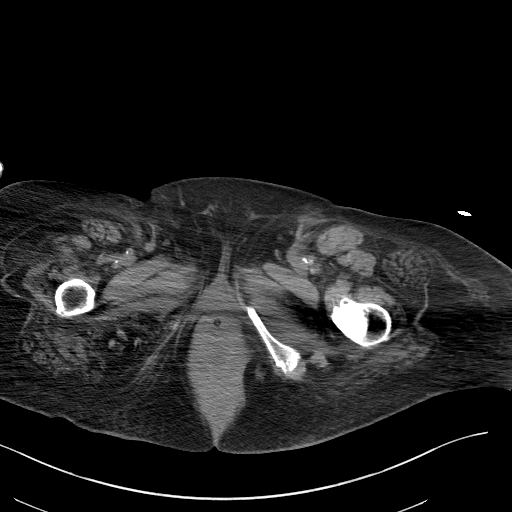
[im 7/119  lung]
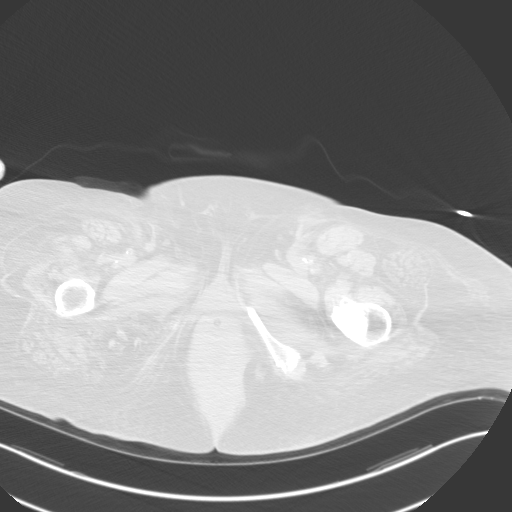
[im 20/119  lung]
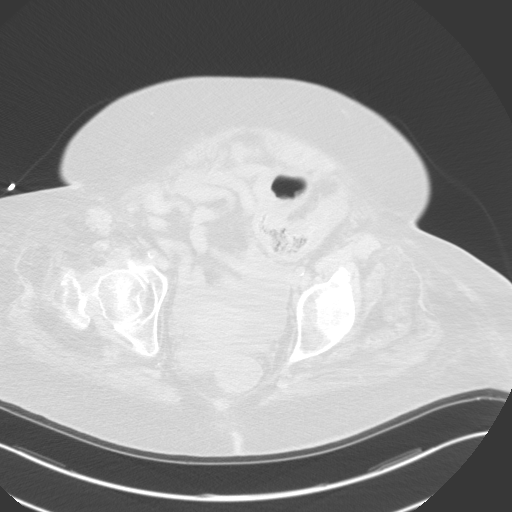
[im 27/119  lung]
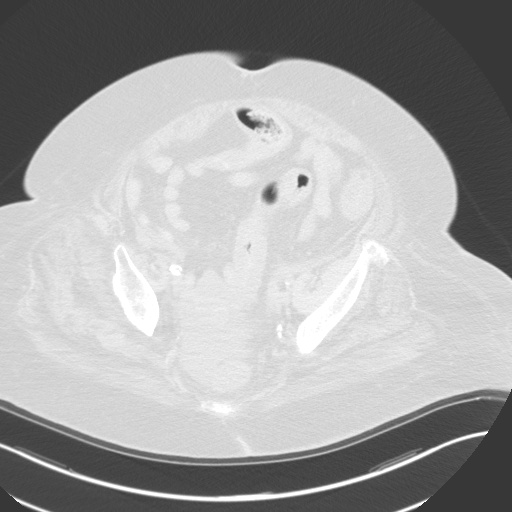
[im 33/119  lung]
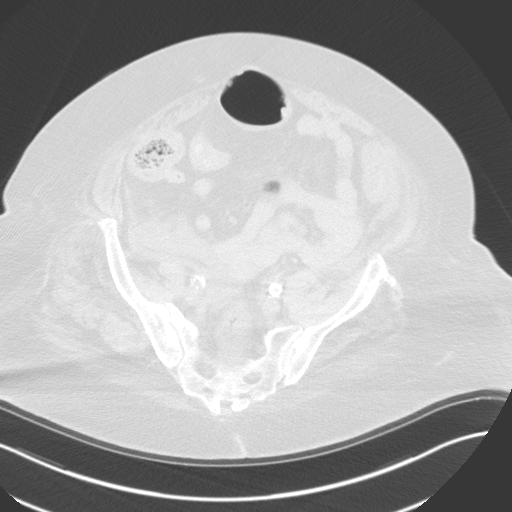
[im 46/119  mediastinal]
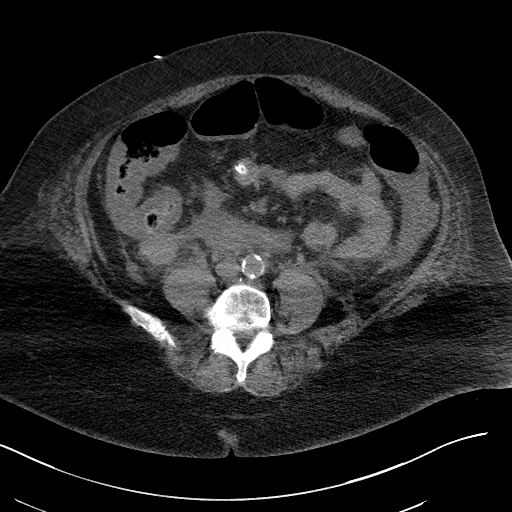
[im 46/119  lung]
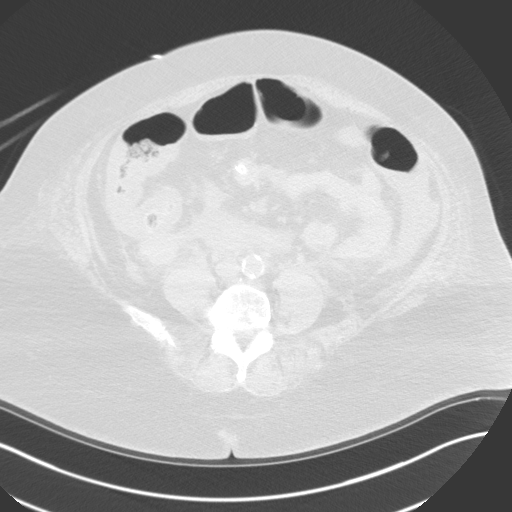
[im 53/119  lung]
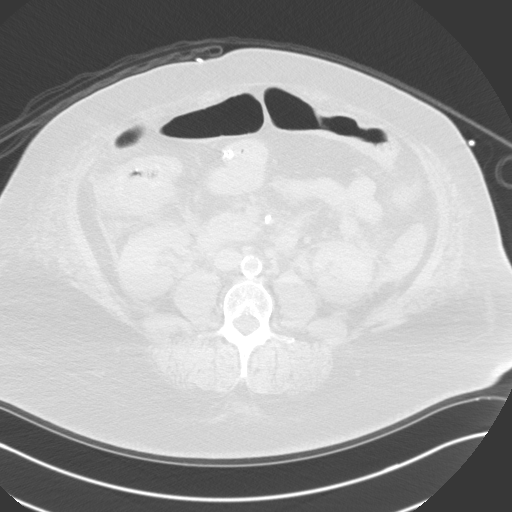
[im 66/119  lung]
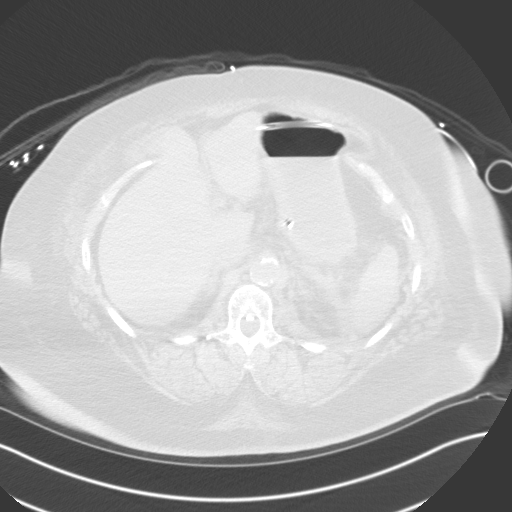
[im 73/119  lung]
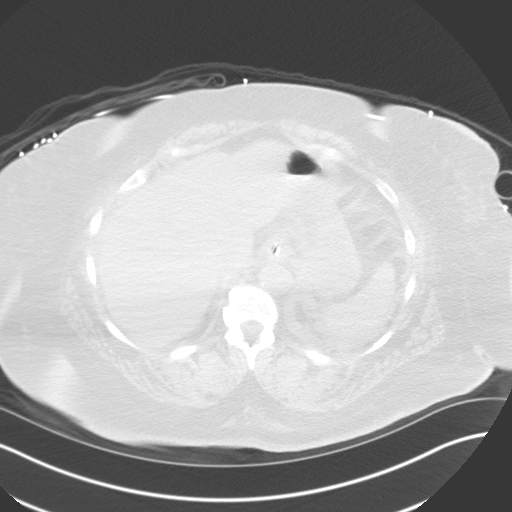
[im 86/119  mediastinal]
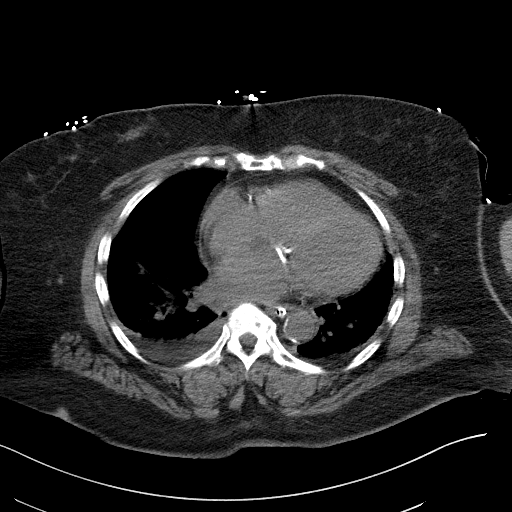
[im 86/119  lung]
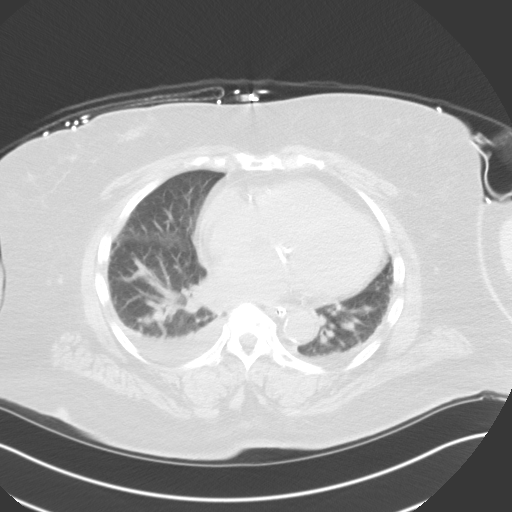
[im 92/119  lung]
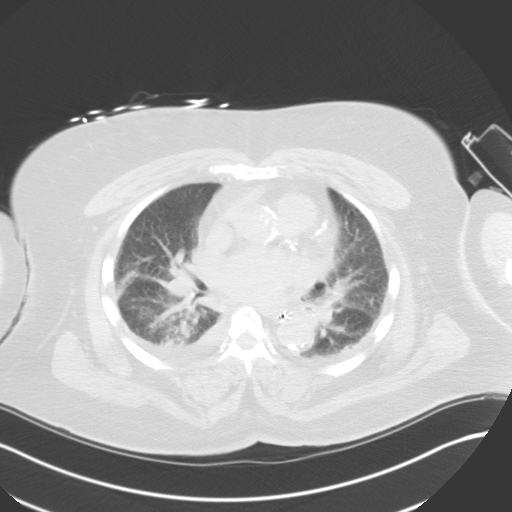
[im 99/119  lung]
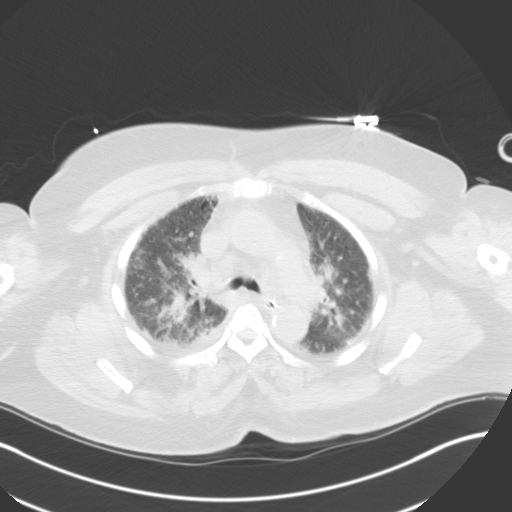
[im 112/119  lung]
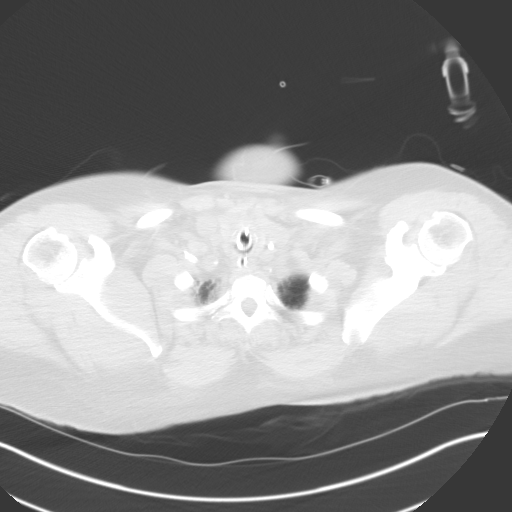

[Series 5: coronal · coronal · 1.16mm/px · 3 of 100 slices shown]
[im 20/100  lung]
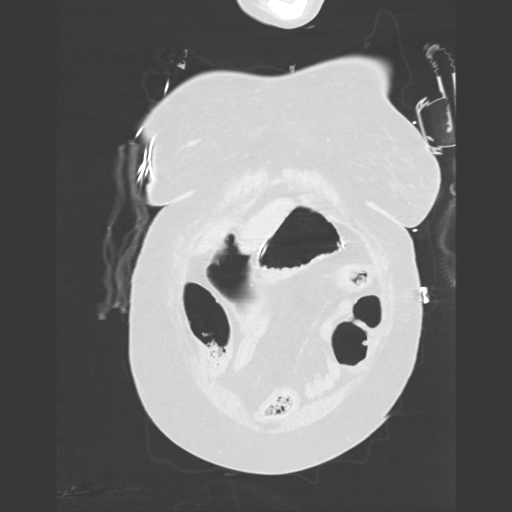
[im 40/100  lung]
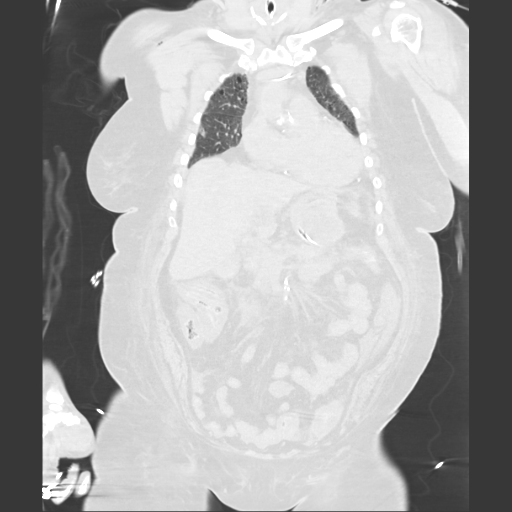
[im 60/100  lung]
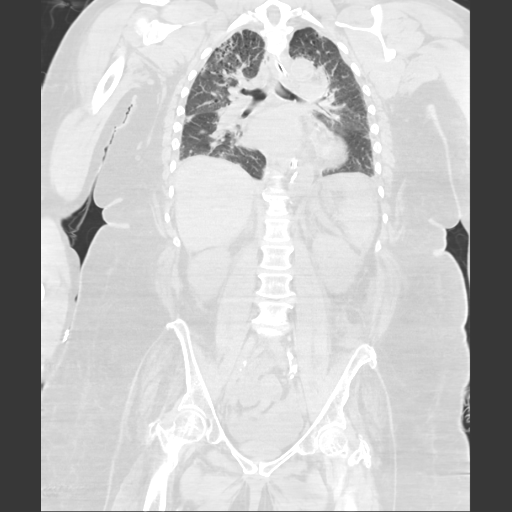

[15 of 36 positions shown; findings below may reference images not displayed]

FINDINGS: CT CHEST FINDINGS

Endotracheal and enteric tubes are in place. Central venous catheter
with tip in the brachiocephalic vein. Normal heart size. Aortic
valve and coronary artery calcifications. Calcification of thoracic
aorta without aneurysm. Esophagus is decompressed. No significant
lymphadenopathy in the chest.

Small bilateral pleural effusions. Diffuse patchy infiltrative
process throughout the lungs may represent edema or pneumonia.
Scattered emphysematous changes are present. No pneumothorax.

CT ABDOMEN AND PELVIS FINDINGS

There is moderate diffuse free fluid and edema throughout the
abdomen and retroperitoneum. Diffuse colonic wall thickening.
Changes likely due to colitis. This could indicate pseudomembranous
colitis or infectious or inflammatory colitis. No loculated fluid to
suggest abscess. The unenhanced appearance of the liver, spleen,
pancreas, gallbladder, kidneys, inferior vena cava, and
retroperitoneal lymph nodes is grossly unremarkable. Calcification
of the abdominal aorta without aneurysm. No free air in the abdomen.

Pelvis: Bladder is decompressed with a Foley catheter. No pelvic
mass or lymphadenopathy.

Bones: Degenerative changes in the thoracic and lumbar spine. No
vertebral compression deformities are displaced fractures
identified. Sclerosis in the proximal left femur was present on
previous study is likely benign. Postoperative changes in the left
pelvis.
IMPRESSION: Patchy diffuse infiltrative changes in the lungs may represent edema
or pneumonia.

Moderate diffuse free fluid and edema throughout the abdomen,
mesenteric, and retroperitoneum. Diffuse colonic wall thickening.
Changes likely due to colitis of nonspecific etiology.

## 2014-10-21 IMAGING — CR DG CHEST 1V PORT
1 series · 1 of 1 positions shown · non-contrast
Comparison: 06/20/2014

CLINICAL DATA: Assess endotracheal and OG tubes

EXAM:
PORTABLE CHEST - 1 VIEW

[AP]
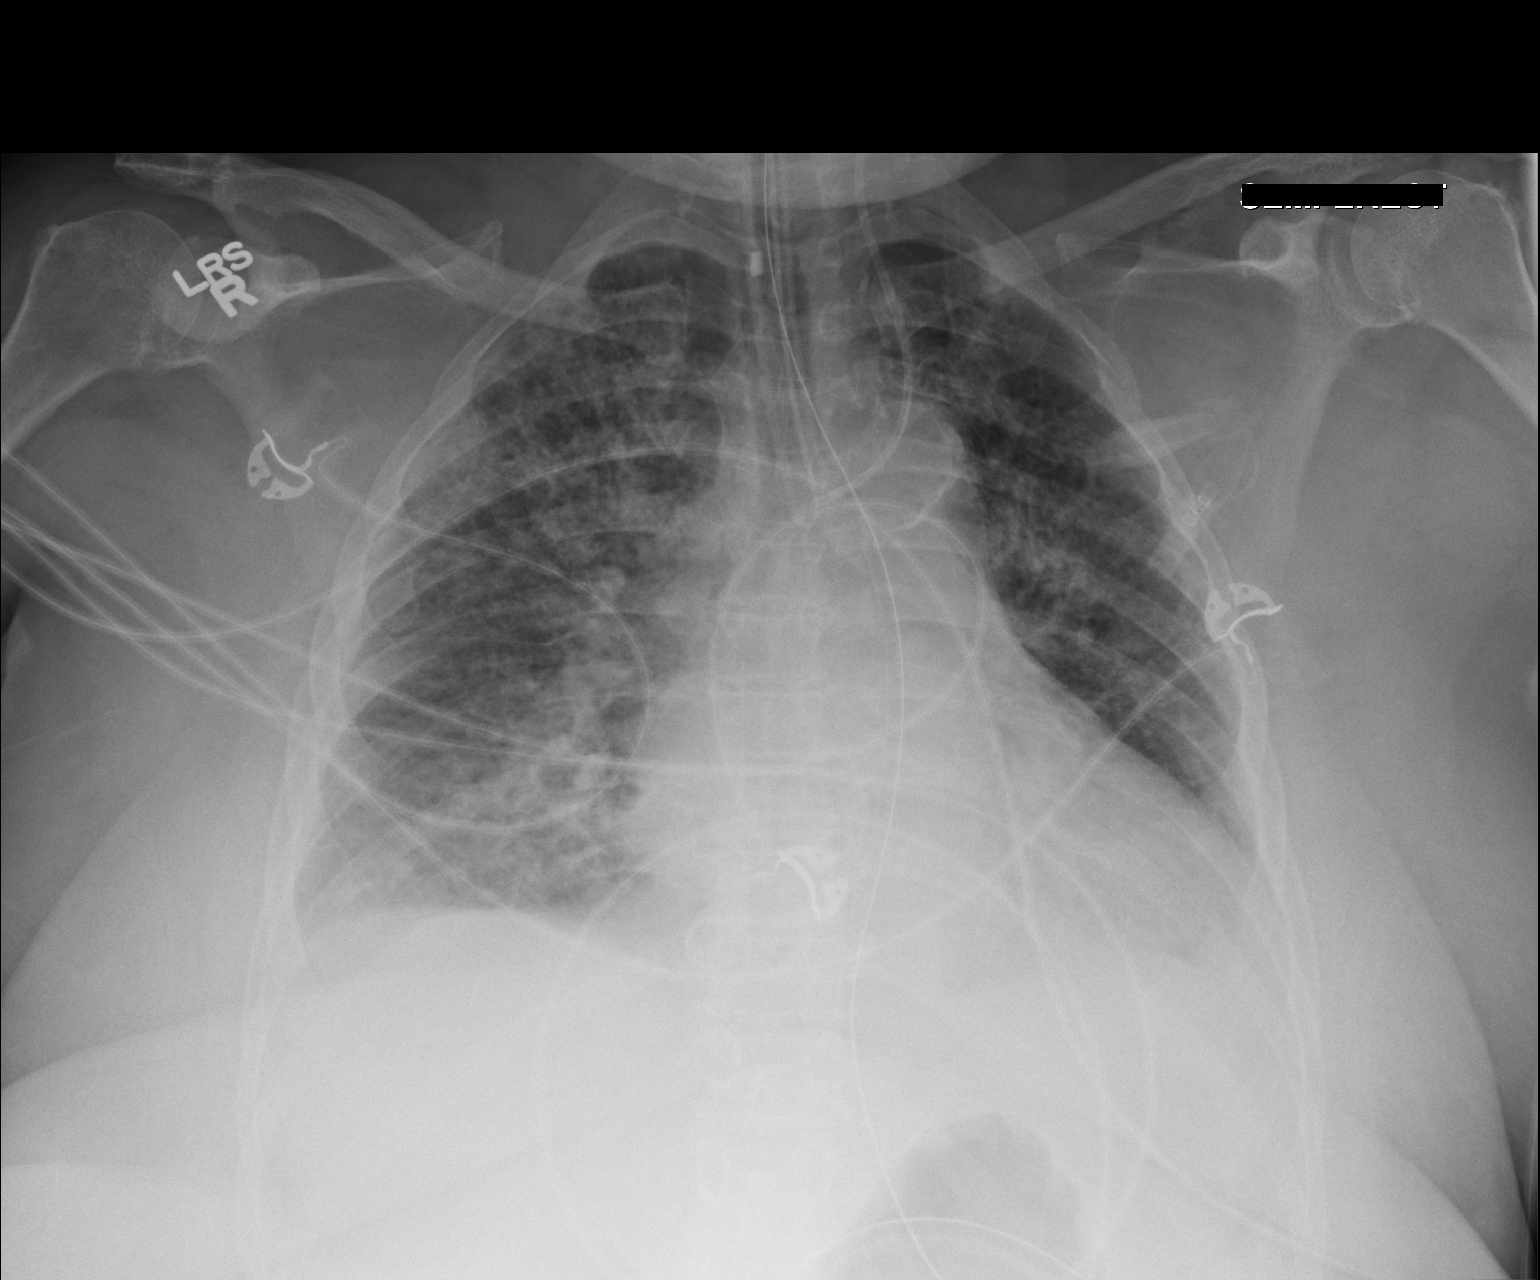

[1 of 1 positions shown; findings below may reference images not displayed]

FINDINGS: Endotracheal tube tip measures 1.5 cm above the carinal. Enteric
tube tip is off the field of view but below the left hemidiaphragm
consistent with location at least in the stomach. Cardiac
enlargement. Bilateral perihilar infiltration suggesting edema.
Slight blunting of costophrenic angles suggests small pleural
effusions. No pneumothorax.
IMPRESSION: Appliances appear to be in satisfactory location. Cardiac
enlargement with pulmonary vascular congestion and edema. Probable
small pleural effusions.

## 2014-11-21 ENCOUNTER — Ambulatory Visit: Payer: Medicare Other | Admitting: Physical Medicine & Rehabilitation
